# Patient Record
Sex: Male | Born: 1965 | Race: White | Hispanic: No | Marital: Married | Smoking: Current every day smoker
Health system: Southern US, Community
[De-identification: ages and names within clinical notes are randomized; demographics above are authoritative.]

## PROBLEM LIST (undated history)

## (undated) DIAGNOSIS — K859 Acute pancreatitis without necrosis or infection, unspecified: Secondary | ICD-10-CM

## (undated) DIAGNOSIS — Z720 Tobacco use: Secondary | ICD-10-CM

## (undated) DIAGNOSIS — I10 Essential (primary) hypertension: Secondary | ICD-10-CM

## (undated) DIAGNOSIS — F1020 Alcohol dependence, uncomplicated: Secondary | ICD-10-CM

## (undated) DIAGNOSIS — M109 Gout, unspecified: Secondary | ICD-10-CM

---

## 2020-10-21 ENCOUNTER — Encounter (HOSPITAL_COMMUNITY): Payer: Self-pay | Admitting: Pulmonary Disease

## 2020-10-21 ENCOUNTER — Inpatient Hospital Stay (HOSPITAL_COMMUNITY): Payer: BC Managed Care – PPO | Admitting: Anesthesiology

## 2020-10-21 ENCOUNTER — Other Ambulatory Visit: Payer: Self-pay

## 2020-10-21 ENCOUNTER — Inpatient Hospital Stay (HOSPITAL_COMMUNITY)
Admission: EM | Admit: 2020-10-21 | Discharge: 2020-12-14 | DRG: 853 | Disposition: E | Payer: BC Managed Care – PPO | Attending: Pulmonary Disease | Admitting: Pulmonary Disease

## 2020-10-21 ENCOUNTER — Emergency Department (HOSPITAL_COMMUNITY): Payer: BC Managed Care – PPO

## 2020-10-21 ENCOUNTER — Encounter (HOSPITAL_COMMUNITY): Admission: EM | Disposition: E | Payer: Self-pay | Source: Home / Self Care | Attending: Family Medicine

## 2020-10-21 DIAGNOSIS — Z452 Encounter for adjustment and management of vascular access device: Secondary | ICD-10-CM

## 2020-10-21 DIAGNOSIS — D62 Acute posthemorrhagic anemia: Secondary | ICD-10-CM | POA: Diagnosis present

## 2020-10-21 DIAGNOSIS — Z515 Encounter for palliative care: Secondary | ICD-10-CM | POA: Diagnosis not present

## 2020-10-21 DIAGNOSIS — D689 Coagulation defect, unspecified: Secondary | ICD-10-CM | POA: Diagnosis present

## 2020-10-21 DIAGNOSIS — Z4659 Encounter for fitting and adjustment of other gastrointestinal appliance and device: Secondary | ICD-10-CM

## 2020-10-21 DIAGNOSIS — I469 Cardiac arrest, cause unspecified: Secondary | ICD-10-CM | POA: Diagnosis not present

## 2020-10-21 DIAGNOSIS — S0091XA Abrasion of unspecified part of head, initial encounter: Secondary | ICD-10-CM | POA: Diagnosis present

## 2020-10-21 DIAGNOSIS — D696 Thrombocytopenia, unspecified: Secondary | ICD-10-CM | POA: Diagnosis not present

## 2020-10-21 DIAGNOSIS — K7011 Alcoholic hepatitis with ascites: Secondary | ICD-10-CM | POA: Diagnosis present

## 2020-10-21 DIAGNOSIS — J690 Pneumonitis due to inhalation of food and vomit: Secondary | ICD-10-CM

## 2020-10-21 DIAGNOSIS — F17210 Nicotine dependence, cigarettes, uncomplicated: Secondary | ICD-10-CM | POA: Diagnosis present

## 2020-10-21 DIAGNOSIS — A4181 Sepsis due to Enterococcus: Secondary | ICD-10-CM | POA: Diagnosis present

## 2020-10-21 DIAGNOSIS — F1020 Alcohol dependence, uncomplicated: Secondary | ICD-10-CM | POA: Diagnosis present

## 2020-10-21 DIAGNOSIS — E43 Unspecified severe protein-calorie malnutrition: Secondary | ICD-10-CM | POA: Diagnosis present

## 2020-10-21 DIAGNOSIS — E871 Hypo-osmolality and hyponatremia: Secondary | ICD-10-CM | POA: Diagnosis not present

## 2020-10-21 DIAGNOSIS — K750 Abscess of liver: Secondary | ICD-10-CM | POA: Diagnosis not present

## 2020-10-21 DIAGNOSIS — K632 Fistula of intestine: Secondary | ICD-10-CM | POA: Diagnosis present

## 2020-10-21 DIAGNOSIS — R140 Abdominal distension (gaseous): Secondary | ICD-10-CM

## 2020-10-21 DIAGNOSIS — J9601 Acute respiratory failure with hypoxia: Secondary | ICD-10-CM | POA: Diagnosis not present

## 2020-10-21 DIAGNOSIS — G929 Unspecified toxic encephalopathy: Secondary | ICD-10-CM | POA: Diagnosis not present

## 2020-10-21 DIAGNOSIS — Y848 Other medical procedures as the cause of abnormal reaction of the patient, or of later complication, without mention of misadventure at the time of the procedure: Secondary | ICD-10-CM | POA: Diagnosis not present

## 2020-10-21 DIAGNOSIS — R198 Other specified symptoms and signs involving the digestive system and abdomen: Secondary | ICD-10-CM

## 2020-10-21 DIAGNOSIS — E8809 Other disorders of plasma-protein metabolism, not elsewhere classified: Secondary | ICD-10-CM | POA: Diagnosis not present

## 2020-10-21 DIAGNOSIS — J9600 Acute respiratory failure, unspecified whether with hypoxia or hypercapnia: Secondary | ICD-10-CM

## 2020-10-21 DIAGNOSIS — R188 Other ascites: Secondary | ICD-10-CM | POA: Diagnosis not present

## 2020-10-21 DIAGNOSIS — R1310 Dysphagia, unspecified: Secondary | ICD-10-CM | POA: Diagnosis not present

## 2020-10-21 DIAGNOSIS — K7031 Alcoholic cirrhosis of liver with ascites: Secondary | ICD-10-CM | POA: Diagnosis present

## 2020-10-21 DIAGNOSIS — R0689 Other abnormalities of breathing: Secondary | ICD-10-CM

## 2020-10-21 DIAGNOSIS — R0902 Hypoxemia: Secondary | ICD-10-CM

## 2020-10-21 DIAGNOSIS — R0602 Shortness of breath: Secondary | ICD-10-CM

## 2020-10-21 DIAGNOSIS — G928 Other toxic encephalopathy: Secondary | ICD-10-CM | POA: Diagnosis present

## 2020-10-21 DIAGNOSIS — Z9911 Dependence on respirator [ventilator] status: Secondary | ICD-10-CM

## 2020-10-21 DIAGNOSIS — K631 Perforation of intestine (nontraumatic): Secondary | ICD-10-CM | POA: Diagnosis not present

## 2020-10-21 DIAGNOSIS — N170 Acute kidney failure with tubular necrosis: Secondary | ICD-10-CM | POA: Diagnosis not present

## 2020-10-21 DIAGNOSIS — K651 Peritoneal abscess: Secondary | ICD-10-CM | POA: Diagnosis not present

## 2020-10-21 DIAGNOSIS — Z800 Family history of malignant neoplasm of digestive organs: Secondary | ICD-10-CM

## 2020-10-21 DIAGNOSIS — R6520 Severe sepsis without septic shock: Secondary | ICD-10-CM

## 2020-10-21 DIAGNOSIS — I5021 Acute systolic (congestive) heart failure: Secondary | ICD-10-CM | POA: Diagnosis not present

## 2020-10-21 DIAGNOSIS — Z789 Other specified health status: Secondary | ICD-10-CM | POA: Diagnosis not present

## 2020-10-21 DIAGNOSIS — J95821 Acute postprocedural respiratory failure: Secondary | ICD-10-CM | POA: Diagnosis not present

## 2020-10-21 DIAGNOSIS — R4182 Altered mental status, unspecified: Secondary | ICD-10-CM | POA: Diagnosis not present

## 2020-10-21 DIAGNOSIS — R6521 Severe sepsis with septic shock: Secondary | ICD-10-CM | POA: Diagnosis present

## 2020-10-21 DIAGNOSIS — E8770 Fluid overload, unspecified: Secondary | ICD-10-CM | POA: Diagnosis not present

## 2020-10-21 DIAGNOSIS — A419 Sepsis, unspecified organism: Secondary | ICD-10-CM

## 2020-10-21 DIAGNOSIS — L89156 Pressure-induced deep tissue damage of sacral region: Secondary | ICD-10-CM | POA: Diagnosis present

## 2020-10-21 DIAGNOSIS — D539 Nutritional anemia, unspecified: Secondary | ICD-10-CM | POA: Diagnosis present

## 2020-10-21 DIAGNOSIS — G9341 Metabolic encephalopathy: Secondary | ICD-10-CM | POA: Diagnosis not present

## 2020-10-21 DIAGNOSIS — Z79899 Other long term (current) drug therapy: Secondary | ICD-10-CM

## 2020-10-21 DIAGNOSIS — R0603 Acute respiratory distress: Secondary | ICD-10-CM

## 2020-10-21 DIAGNOSIS — T380X5A Adverse effect of glucocorticoids and synthetic analogues, initial encounter: Secondary | ICD-10-CM | POA: Diagnosis not present

## 2020-10-21 DIAGNOSIS — E669 Obesity, unspecified: Secondary | ICD-10-CM | POA: Diagnosis present

## 2020-10-21 DIAGNOSIS — K8592 Acute pancreatitis with infected necrosis, unspecified: Secondary | ICD-10-CM | POA: Diagnosis present

## 2020-10-21 DIAGNOSIS — J158 Pneumonia due to other specified bacteria: Secondary | ICD-10-CM | POA: Diagnosis not present

## 2020-10-21 DIAGNOSIS — K8591 Acute pancreatitis with uninfected necrosis, unspecified: Secondary | ICD-10-CM

## 2020-10-21 DIAGNOSIS — G8929 Other chronic pain: Secondary | ICD-10-CM | POA: Diagnosis present

## 2020-10-21 DIAGNOSIS — Z7189 Other specified counseling: Secondary | ICD-10-CM | POA: Diagnosis not present

## 2020-10-21 DIAGNOSIS — E1165 Type 2 diabetes mellitus with hyperglycemia: Secondary | ICD-10-CM | POA: Diagnosis not present

## 2020-10-21 DIAGNOSIS — Z781 Physical restraint status: Secondary | ICD-10-CM

## 2020-10-21 DIAGNOSIS — K7682 Hepatic encephalopathy: Secondary | ICD-10-CM | POA: Diagnosis not present

## 2020-10-21 DIAGNOSIS — L0291 Cutaneous abscess, unspecified: Secondary | ICD-10-CM | POA: Diagnosis not present

## 2020-10-21 DIAGNOSIS — J95851 Ventilator associated pneumonia: Secondary | ICD-10-CM | POA: Diagnosis not present

## 2020-10-21 DIAGNOSIS — I10 Essential (primary) hypertension: Secondary | ICD-10-CM | POA: Diagnosis present

## 2020-10-21 DIAGNOSIS — Z20822 Contact with and (suspected) exposure to covid-19: Secondary | ICD-10-CM | POA: Diagnosis present

## 2020-10-21 DIAGNOSIS — J9811 Atelectasis: Secondary | ICD-10-CM | POA: Diagnosis not present

## 2020-10-21 DIAGNOSIS — Z7951 Long term (current) use of inhaled steroids: Secondary | ICD-10-CM

## 2020-10-21 DIAGNOSIS — E876 Hypokalemia: Secondary | ICD-10-CM | POA: Diagnosis not present

## 2020-10-21 DIAGNOSIS — L8990 Pressure ulcer of unspecified site, unspecified stage: Secondary | ICD-10-CM | POA: Diagnosis not present

## 2020-10-21 DIAGNOSIS — K567 Ileus, unspecified: Secondary | ICD-10-CM

## 2020-10-21 DIAGNOSIS — G9340 Encephalopathy, unspecified: Secondary | ICD-10-CM | POA: Diagnosis not present

## 2020-10-21 DIAGNOSIS — J189 Pneumonia, unspecified organism: Secondary | ICD-10-CM

## 2020-10-21 DIAGNOSIS — J9690 Respiratory failure, unspecified, unspecified whether with hypoxia or hypercapnia: Secondary | ICD-10-CM

## 2020-10-21 DIAGNOSIS — R410 Disorientation, unspecified: Secondary | ICD-10-CM | POA: Diagnosis not present

## 2020-10-21 DIAGNOSIS — F419 Anxiety disorder, unspecified: Secondary | ICD-10-CM | POA: Diagnosis present

## 2020-10-21 DIAGNOSIS — M109 Gout, unspecified: Secondary | ICD-10-CM | POA: Diagnosis present

## 2020-10-21 DIAGNOSIS — Z66 Do not resuscitate: Secondary | ICD-10-CM | POA: Diagnosis not present

## 2020-10-21 DIAGNOSIS — R609 Edema, unspecified: Secondary | ICD-10-CM | POA: Diagnosis not present

## 2020-10-21 DIAGNOSIS — Z9889 Other specified postprocedural states: Secondary | ICD-10-CM | POA: Diagnosis not present

## 2020-10-21 HISTORY — PX: LAPAROSCOPY: SHX197

## 2020-10-21 HISTORY — PX: LAPAROTOMY: SHX154

## 2020-10-21 HISTORY — DX: Tobacco use: Z72.0

## 2020-10-21 HISTORY — DX: Acute pancreatitis without necrosis or infection, unspecified: K85.90

## 2020-10-21 HISTORY — DX: Essential (primary) hypertension: I10

## 2020-10-21 HISTORY — DX: Gout, unspecified: M10.9

## 2020-10-21 HISTORY — DX: Alcohol dependence, uncomplicated: F10.20

## 2020-10-21 LAB — URINALYSIS, ROUTINE W REFLEX MICROSCOPIC
Glucose, UA: NEGATIVE mg/dL
Ketones, ur: 15 mg/dL — AB
Leukocytes,Ua: NEGATIVE
Nitrite: NEGATIVE
Protein, ur: NEGATIVE mg/dL
Specific Gravity, Urine: 1.015 (ref 1.005–1.030)
pH: 6 (ref 5.0–8.0)

## 2020-10-21 LAB — PROTIME-INR
INR: 2.1 — ABNORMAL HIGH (ref 0.8–1.2)
Prothrombin Time: 23.9 seconds — ABNORMAL HIGH (ref 11.4–15.2)

## 2020-10-21 LAB — LIPASE, BLOOD: Lipase: 25 U/L (ref 11–51)

## 2020-10-21 LAB — CBC
HCT: 34.1 % — ABNORMAL LOW (ref 39.0–52.0)
Hemoglobin: 11.4 g/dL — ABNORMAL LOW (ref 13.0–17.0)
MCH: 36.1 pg — ABNORMAL HIGH (ref 26.0–34.0)
MCHC: 33.4 g/dL (ref 30.0–36.0)
MCV: 107.9 fL — ABNORMAL HIGH (ref 80.0–100.0)
Platelets: 322 10*3/uL (ref 150–400)
RBC: 3.16 MIL/uL — ABNORMAL LOW (ref 4.22–5.81)
RDW: 14.6 % (ref 11.5–15.5)
WBC: 29.9 10*3/uL — ABNORMAL HIGH (ref 4.0–10.5)
nRBC: 0.3 % — ABNORMAL HIGH (ref 0.0–0.2)

## 2020-10-21 LAB — I-STAT ARTERIAL BLOOD GAS, ED
Acid-Base Excess: 5 mmol/L — ABNORMAL HIGH (ref 0.0–2.0)
Bicarbonate: 28.7 mmol/L — ABNORMAL HIGH (ref 20.0–28.0)
Calcium, Ion: 1.06 mmol/L — ABNORMAL LOW (ref 1.15–1.40)
HCT: 32 % — ABNORMAL LOW (ref 39.0–52.0)
Hemoglobin: 10.9 g/dL — ABNORMAL LOW (ref 13.0–17.0)
O2 Saturation: 94 %
Patient temperature: 98.6
Potassium: 4.3 mmol/L (ref 3.5–5.1)
Sodium: 124 mmol/L — ABNORMAL LOW (ref 135–145)
TCO2: 30 mmol/L (ref 22–32)
pCO2 arterial: 36.1 mmHg (ref 32.0–48.0)
pH, Arterial: 7.509 — ABNORMAL HIGH (ref 7.350–7.450)
pO2, Arterial: 64 mmHg — ABNORMAL LOW (ref 83.0–108.0)

## 2020-10-21 LAB — CK: Total CK: 27 U/L — ABNORMAL LOW (ref 49–397)

## 2020-10-21 LAB — COMPREHENSIVE METABOLIC PANEL
ALT: 53 U/L — ABNORMAL HIGH (ref 0–44)
AST: 76 U/L — ABNORMAL HIGH (ref 15–41)
Albumin: 1.7 g/dL — ABNORMAL LOW (ref 3.5–5.0)
Alkaline Phosphatase: 117 U/L (ref 38–126)
Anion gap: 14 (ref 5–15)
BUN: 26 mg/dL — ABNORMAL HIGH (ref 6–20)
CO2: 26 mmol/L (ref 22–32)
Calcium: 8.1 mg/dL — ABNORMAL LOW (ref 8.9–10.3)
Chloride: 84 mmol/L — ABNORMAL LOW (ref 98–111)
Creatinine, Ser: 1.19 mg/dL (ref 0.61–1.24)
GFR, Estimated: >60 mL/min (ref >60)
Glucose, Bld: 199 mg/dL — ABNORMAL HIGH (ref 70–99)
Potassium: 4.7 mmol/L (ref 3.5–5.1)
Sodium: 124 mmol/L — ABNORMAL LOW (ref 135–145)
Total Bilirubin: 1.4 mg/dL — ABNORMAL HIGH (ref 0.3–1.2)
Total Protein: 6.1 g/dL — ABNORMAL LOW (ref 6.5–8.1)

## 2020-10-21 LAB — RESP PANEL BY RT-PCR (FLU A&B, COVID) ARPGX2
Influenza A by PCR: NEGATIVE
Influenza B by PCR: NEGATIVE
SARS Coronavirus 2 by RT PCR: NEGATIVE

## 2020-10-21 LAB — PHOSPHORUS: Phosphorus: 4.6 mg/dL (ref 2.5–4.6)

## 2020-10-21 LAB — HEMOGLOBIN A1C
Hgb A1c MFr Bld: 6.8 % — ABNORMAL HIGH (ref 4.8–5.6)
Mean Plasma Glucose: 148.46 mg/dL

## 2020-10-21 LAB — URINALYSIS, MICROSCOPIC (REFLEX): Bacteria, UA: NONE SEEN

## 2020-10-21 LAB — MAGNESIUM: Magnesium: 2.3 mg/dL (ref 1.7–2.4)

## 2020-10-21 LAB — LACTIC ACID, PLASMA: Lactic Acid, Venous: 2 mmol/L (ref 0.5–1.9)

## 2020-10-21 LAB — ETHANOL: Alcohol, Ethyl (B): <10 mg/dL (ref <10)

## 2020-10-21 LAB — AMMONIA: Ammonia: 37 umol/L — ABNORMAL HIGH (ref 9–35)

## 2020-10-21 LAB — PROCALCITONIN: Procalcitonin: 16.09 ng/mL

## 2020-10-21 LAB — LACTATE DEHYDROGENASE: LDH: 323 U/L — ABNORMAL HIGH (ref 98–192)

## 2020-10-21 LAB — BRAIN NATRIURETIC PEPTIDE: B Natriuretic Peptide: 139.2 pg/mL — ABNORMAL HIGH (ref 0.0–100.0)

## 2020-10-21 LAB — SODIUM: Sodium: 124 mmol/L — ABNORMAL LOW (ref 135–145)

## 2020-10-21 LAB — CBG MONITORING, ED: Glucose-Capillary: 206 mg/dL — ABNORMAL HIGH (ref 70–99)

## 2020-10-21 LAB — OSMOLALITY: Osmolality: 280 mOsm/kg (ref 275–295)

## 2020-10-21 SURGERY — LAPAROSCOPY, DIAGNOSTIC
Anesthesia: General | Site: Abdomen

## 2020-10-21 MED ORDER — FLUTICASONE-UMECLIDIN-VILANT 100-62.5-25 MCG/INH IN AEPB: 1.0000 | INHALATION_SPRAY | Freq: Every day | RESPIRATORY_TRACT | Status: DC

## 2020-10-21 MED ORDER — ALBUMIN HUMAN 25 % IV SOLN
25.0000 g | Freq: Once | INTRAVENOUS | Status: DC
  Filled 2020-10-21: qty 100

## 2020-10-21 MED ORDER — ALBUMIN HUMAN 25 % IV SOLN
25.0000 g | Freq: Four times a day (QID) | INTRAVENOUS | Status: AC
  Administered 2020-10-22 (×2): 25 g via INTRAVENOUS
  Filled 2020-10-21 (×3): qty 100

## 2020-10-21 MED ORDER — UMECLIDINIUM BROMIDE 62.5 MCG/INH IN AEPB
1.0000 | INHALATION_SPRAY | Freq: Every day | RESPIRATORY_TRACT | Status: DC
  Administered 2020-10-27 – 2020-11-13 (×17): 1 via RESPIRATORY_TRACT
  Filled 2020-10-21 (×4): qty 7

## 2020-10-21 MED ORDER — DOCUSATE SODIUM 100 MG PO CAPS: 100.0000 mg | ORAL_CAPSULE | Freq: Two times a day (BID) | ORAL | Status: DC | PRN

## 2020-10-21 MED ORDER — POLYETHYLENE GLYCOL 3350 17 G PO PACK: 17.0000 g | PACK | Freq: Every day | ORAL | Status: DC | PRN

## 2020-10-21 MED ORDER — PANTOPRAZOLE SODIUM 40 MG IV SOLR
40.0000 mg | Freq: Two times a day (BID) | INTRAVENOUS | Status: DC
  Administered 2020-10-22: 40 mg via INTRAVENOUS
  Filled 2020-10-21: qty 40

## 2020-10-21 MED ORDER — LORAZEPAM 2 MG/ML IJ SOLN
0.5000 mg | Freq: Once | INTRAMUSCULAR | Status: AC
  Administered 2020-10-21: 0.5 mg via INTRAVENOUS
  Filled 2020-10-21: qty 1

## 2020-10-21 MED ORDER — ALBUMIN HUMAN 25 % IV SOLN
50.0000 g | Freq: Once | INTRAVENOUS | Status: AC
  Administered 2020-10-22: 50 g via INTRAVENOUS
  Filled 2020-10-21: qty 200

## 2020-10-21 MED ORDER — PIPERACILLIN-TAZOBACTAM 3.375 G IVPB 30 MIN
3.3750 g | Freq: Once | INTRAVENOUS | Status: AC
  Administered 2020-10-21: 3.375 g via INTRAVENOUS
  Filled 2020-10-21: qty 50

## 2020-10-21 MED ORDER — FLUTICASONE FUROATE-VILANTEROL 100-25 MCG/INH IN AEPB
1.0000 | INHALATION_SPRAY | Freq: Every day | RESPIRATORY_TRACT | Status: DC
  Administered 2020-10-27 – 2020-11-13 (×17): 1 via RESPIRATORY_TRACT
  Filled 2020-10-21 (×3): qty 28

## 2020-10-21 MED ORDER — FLUCONAZOLE IN SODIUM CHLORIDE 400-0.9 MG/200ML-% IV SOLN
400.0000 mg | INTRAVENOUS | Status: DC
  Administered 2020-10-21 – 2020-10-23 (×3): 400 mg via INTRAVENOUS
  Filled 2020-10-21 (×3): qty 200

## 2020-10-21 MED ORDER — FOLIC ACID 5 MG/ML IJ SOLN
1.0000 mg | Freq: Every day | INTRAMUSCULAR | Status: DC
  Administered 2020-10-22 – 2020-10-23 (×2): 1 mg via INTRAVENOUS
  Filled 2020-10-21 (×2): qty 0.2

## 2020-10-21 MED ORDER — IOHEXOL 350 MG/ML SOLN
100.0000 mL | Freq: Once | INTRAVENOUS | Status: AC | PRN
  Administered 2020-10-21: 100 mL via INTRAVENOUS

## 2020-10-21 MED ORDER — PIPERACILLIN-TAZOBACTAM 3.375 G IVPB
3.3750 g | Freq: Three times a day (TID) | INTRAVENOUS | Status: DC
  Administered 2020-10-22 – 2020-10-28 (×20): 3.375 g via INTRAVENOUS
  Filled 2020-10-21 (×23): qty 50

## 2020-10-21 MED ORDER — ALBUTEROL SULFATE (2.5 MG/3ML) 0.083% IN NEBU
3.0000 mL | INHALATION_SOLUTION | Freq: Four times a day (QID) | RESPIRATORY_TRACT | Status: DC | PRN
  Administered 2020-11-05 – 2020-11-19 (×5): 3 mL via RESPIRATORY_TRACT
  Filled 2020-10-21 (×3): qty 3
  Filled 2020-10-21: qty 6
  Filled 2020-10-21: qty 3

## 2020-10-21 MED ORDER — LACTATED RINGERS IV SOLN: INTRAVENOUS | Status: DC

## 2020-10-21 MED ORDER — METRONIDAZOLE 500 MG/100ML IV SOLN
500.0000 mg | Freq: Two times a day (BID) | INTRAVENOUS | Status: DC
  Administered 2020-10-22: 500 mg via INTRAVENOUS
  Filled 2020-10-21: qty 100

## 2020-10-21 MED ORDER — VANCOMYCIN HCL 2000 MG/400ML IV SOLN
2000.0000 mg | Freq: Once | INTRAVENOUS | Status: AC
  Administered 2020-10-21: 2000 mg via INTRAVENOUS
  Filled 2020-10-21: qty 400

## 2020-10-21 MED ORDER — THIAMINE HCL 100 MG/ML IJ SOLN
100.0000 mg | Freq: Every day | INTRAMUSCULAR | Status: DC
  Administered 2020-10-22 – 2020-10-23 (×2): 100 mg via INTRAVENOUS
  Filled 2020-10-21 (×2): qty 2

## 2020-10-21 MED ORDER — HEPARIN SODIUM (PORCINE) 5000 UNIT/ML IJ SOLN
5000.0000 [IU] | Freq: Three times a day (TID) | INTRAMUSCULAR | Status: DC
  Administered 2020-10-22 – 2020-11-17 (×79): 5000 [IU] via SUBCUTANEOUS
  Filled 2020-10-21 (×78): qty 1

## 2020-10-21 MED ORDER — INSULIN ASPART 100 UNIT/ML IJ SOLN
0.0000 [IU] | INTRAMUSCULAR | Status: DC
  Administered 2020-10-22 (×2): 3 [IU] via SUBCUTANEOUS
  Administered 2020-10-22 (×2): 5 [IU] via SUBCUTANEOUS
  Administered 2020-10-22: 3 [IU] via SUBCUTANEOUS
  Administered 2020-10-23: 2 [IU] via SUBCUTANEOUS
  Administered 2020-10-23: 5 [IU] via SUBCUTANEOUS
  Administered 2020-10-23: 3 [IU] via SUBCUTANEOUS

## 2020-10-21 MED ORDER — VANCOMYCIN HCL 1500 MG/300ML IV SOLN: 1500.0000 mg | INTRAVENOUS | Status: DC

## 2020-10-21 MED ORDER — FENTANYL CITRATE PF 50 MCG/ML IJ SOSY
25.0000 ug | PREFILLED_SYRINGE | Freq: Once | INTRAMUSCULAR | Status: AC
  Administered 2020-10-21: 25 ug via INTRAVENOUS
  Filled 2020-10-21: qty 1

## 2020-10-21 MED ORDER — BUPIVACAINE HCL (PF) 0.25 % IJ SOLN
INTRAMUSCULAR | Status: AC
  Filled 2020-10-21: qty 30

## 2020-10-21 SURGICAL SUPPLY — 48 items
BLADE CLIPPER SURG (BLADE) ×3 IMPLANT
BLADE SURG 10 STRL SS (BLADE) ×3 IMPLANT
BNDG GAUZE ELAST 4 BULKY (GAUZE/BANDAGES/DRESSINGS) ×3 IMPLANT
CHLORAPREP W/TINT 26 (MISCELLANEOUS) ×3 IMPLANT
COVER SURGICAL LIGHT HANDLE (MISCELLANEOUS) ×3 IMPLANT
DRAIN CHANNEL 28F RND 3/8 FF (WOUND CARE) ×3 IMPLANT
DRAIN CHANNEL 32F RND 10.7 FF (WOUND CARE) ×3 IMPLANT
DRAPE WARM FLUID 44X44 (DRAPES) ×3 IMPLANT
ELECT CAUTERY BLADE 6.4 (BLADE) ×3 IMPLANT
ELECT REM PT RETURN 9FT ADLT (ELECTROSURGICAL) ×3
ELECTRODE REM PT RTRN 9FT ADLT (ELECTROSURGICAL) ×2 IMPLANT
EVACUATOR SILICONE 100CC (DRAIN) ×6 IMPLANT
GAUZE SPONGE 4X4 12PLY STRL (GAUZE/BANDAGES/DRESSINGS) ×3 IMPLANT
GLOVE SURG POLYISO LF SZ7 (GLOVE) ×3 IMPLANT
GLOVE SURG UNDER POLY LF SZ7 (GLOVE) ×3 IMPLANT
GOWN STRL REUS W/ TWL LRG LVL3 (GOWN DISPOSABLE) ×4 IMPLANT
GOWN STRL REUS W/TWL LRG LVL3 (GOWN DISPOSABLE) ×2
HANDLE SUCTION POOLE (INSTRUMENTS) ×2 IMPLANT
KIT BASIN OR (CUSTOM PROCEDURE TRAY) ×3 IMPLANT
KIT TURNOVER KIT B (KITS) ×3 IMPLANT
NS IRRIG 1000ML POUR BTL (IV SOLUTION) ×3 IMPLANT
PAD ABD 8X10 STRL (GAUZE/BANDAGES/DRESSINGS) ×3 IMPLANT
PAD ARMBOARD 7.5X6 YLW CONV (MISCELLANEOUS) ×6 IMPLANT
PENCIL SMOKE EVACUATOR (MISCELLANEOUS) ×3 IMPLANT
SEALER TISSUE X1 CVD JAW (INSTRUMENTS) ×3 IMPLANT
SET IRRIG TUBING LAPAROSCOPIC (IRRIGATION / IRRIGATOR) ×3 IMPLANT
SET TUBE SMOKE EVAC HIGH FLOW (TUBING) ×3 IMPLANT
SLEEVE ENDOPATH XCEL 5M (ENDOMECHANICALS) ×3 IMPLANT
SPONGE T-LAP 18X18 ~~LOC~~+RFID (SPONGE) ×3 IMPLANT
STAPLER VISISTAT 35W (STAPLE) ×3 IMPLANT
SUCTION POOLE HANDLE (INSTRUMENTS) ×3
SUT ETHILON 2 0 FS 18 (SUTURE) ×6 IMPLANT
SUT PDS AB 0 CT 36 (SUTURE) ×6 IMPLANT
SUT PDS AB 1 TP1 96 (SUTURE) IMPLANT
SUT SILK 2 0 (SUTURE) ×1
SUT SILK 2 0 SH CR/8 (SUTURE) ×3 IMPLANT
SUT SILK 2-0 18XBRD TIE 12 (SUTURE) ×2 IMPLANT
SUT SILK 3 0 (SUTURE) ×1
SUT SILK 3 0 SH CR/8 (SUTURE) ×3 IMPLANT
SUT SILK 3-0 18XBRD TIE 12 (SUTURE) ×2 IMPLANT
TAPE CLOTH SURG 6X10 WHT LF (GAUZE/BANDAGES/DRESSINGS) ×3 IMPLANT
TOWEL GREEN STERILE (TOWEL DISPOSABLE) ×3 IMPLANT
TRAY FOLEY MTR SLVR 16FR STAT (SET/KITS/TRAYS/PACK) ×3 IMPLANT
TRAY LAPAROSCOPIC MC (CUSTOM PROCEDURE TRAY) ×3 IMPLANT
TROCAR XCEL NON-BLD 11X100MML (ENDOMECHANICALS) ×3 IMPLANT
TROCAR XCEL NON-BLD 5MMX100MML (ENDOMECHANICALS) ×3 IMPLANT
WARMER LAPAROSCOPE (MISCELLANEOUS) ×3 IMPLANT
YANKAUER SUCT BULB TIP NO VENT (SUCTIONS) ×3 IMPLANT

## 2020-10-21 NOTE — Progress Notes (Signed)
Pharmacy Antibiotic Note  Paul Chambers is a 55 y.o. male admitted on 10/27/2020 with  intra-abdominal infection .  Pharmacy has been consulted for zosyn and vancomycin dosing.  Patient presenting with 1 week of abdominal pain. Patient recently seen at [facility] and diagnosed with AKI and pancreatitis.  SCr 1.19; WBC 29.9  Plan: Metronidazole per MD - Recommend stopping if zosyn continued post-op Fluconazole per MD Vancomycin 2000 mg once followed by 1500 mg q24h (eAUC 439 using wt of 110 kg and height of 68 inches) Zosyn 3.375g IV q8h (4 hour infusion). Monitor renal function F/u cultures     Temp (24hrs), Avg:99.4 F (37.4 C), Min:99.4 F (37.4 C), Max:99.4 F (37.4 C)  Recent Labs  Lab 10/15/2020 1506  WBC 29.9*  CREATININE 1.19    CrCl cannot be calculated (Unknown ideal weight.).    No Known Allergies  Antimicrobials this admission: zosyn 9/8 >>  vancomycin 9/8 >>  Metronidazole 9/8 >>> Fluconazole 9/8 >>  Microbiology results: Pending  Thank you for allowing pharmacy to be a part of this patient's care.  Cathie Hoops 10/16/2020 10:15 PM

## 2020-10-21 NOTE — Consult Note (Signed)
Reason for Consult:sepsis Referring Provider: Gerhard Munchobert Chambers  Treasa SchoolShawn Chambers is an 55 y.o. male.  HPI: (obtained from family) 55 yo male with 1 week of abdominal pain. [facility] week ago and diagnosed with AKI and pancreatitis, treated for 23 h and discharged home. He has continued to have abdominal pain. Pain is constant. It does not radiate. It is throughout his abdomen. It is worse with food. It is worse with movement. Over the last day he has had increased confusion and pain and brought to the ED.   He received broad spectrum antibiotics and pain control.  He has a drinking history but was never told he had a liver problem. He has been sober for 1 month  No past medical history on file.  No family history on file.  Social History:  has no history on file for tobacco use, alcohol use, and drug use.  Allergies: No Known Allergies  Medications: I have reviewed the patient's current medications.  Results for orders placed or performed during the hospital encounter of 11/02/2020 (from the past 48 hour(s))  CBG monitoring, ED     Status: Abnormal   Collection Time: 10/19/2020  2:56 PM  Result Value Ref Range   Glucose-Capillary 206 (H) 70 - 99 mg/dL    Comment: Glucose reference range applies only to samples taken after fasting for at least 8 hours.  Comprehensive metabolic panel     Status: Abnormal   Collection Time: 10/22/2020  3:06 PM  Result Value Ref Range   Sodium 124 (L) 135 - 145 mmol/L   Potassium 4.7 3.5 - 5.1 mmol/L   Chloride 84 (L) 98 - 111 mmol/L   CO2 26 22 - 32 mmol/L   Glucose, Bld 199 (H) 70 - 99 mg/dL    Comment: Glucose reference range applies only to samples taken after fasting for at least 8 hours.   BUN 26 (H) 6 - 20 mg/dL   Creatinine, Ser 4.091.19 0.61 - 1.24 mg/dL   Calcium 8.1 (L) 8.9 - 10.3 mg/dL   Total Protein 6.1 (L) 6.5 - 8.1 g/dL   Albumin 1.7 (L) 3.5 - 5.0 g/dL   AST 76 (H) 15 - 41 U/L   ALT 53 (H) 0 - 44 U/L   Alkaline  Phosphatase 117 38 - 126 U/L   Total Bilirubin 1.4 (H) 0.3 - 1.2 mg/dL   GFR, Estimated >81>60 >19>60 mL/min    Comment: (NOTE) Calculated using the CKD-EPI Creatinine Equation (2021)    Anion gap 14 5 - 15    Comment: Performed at [facility], [address] [place], [place] [address]  CBC     Status: Abnormal   Collection Time: 10/18/2020  3:06 PM  Result Value Ref Range   WBC 29.9 (H) 4.0 - 10.5 K/uL   RBC 3.16 (L) 4.22 - 5.81 MIL/uL   Hemoglobin 11.4 (L) 13.0 - 17.0 g/dL   HCT 95.634.1 (L) 21.339.0 - 08.652.0 %   MCV 107.9 (H) 80.0 - 100.0 fL   MCH 36.1 (H) 26.0 - 34.0 pg   MCHC 33.4 30.0 - 36.0 g/dL   RDW 57.814.6 46.911.5 - 62.915.5 %   Platelets 322 150 - 400 K/uL   nRBC 0.3 (H) 0.0 - 0.2 %    Comment: Performed at [facility], [address] [place], [place] [address]  Lipase, blood     Status: None   Collection Time: 11/07/2020  3:06 PM  Result Value Ref Range   Lipase 25 11 - 51 U/L    Comment: Performed at [facility], [address] [place], [place] [address]  Brain natriuretic peptide     Status: Abnormal   Collection Time: 10/19/2020  3:06 PM  Result Value Ref Range   B Natriuretic Peptide 139.2 (H) 0.0 - 100.0 pg/mL    Comment: Performed at [facility], [address], [place], [place] [address]  Urinalysis, Routine w reflex microscopic     Status: Abnormal   Collection Time: 10/31/2020  3:49 PM  Result Value Ref Range   Color, Urine AMBER (A) YELLOW    Comment: BIOCHEMICALS MAY BE AFFECTED BY COLOR   APPearance CLEAR CLEAR   Specific Gravity, Urine 1.015 1.005 - 1.030   pH 6.0 5.0 - 8.0   Glucose, UA NEGATIVE NEGATIVE mg/dL   Hgb urine dipstick TRACE (A) NEGATIVE   Bilirubin Urine SMALL (A) NEGATIVE   Ketones, ur 15 (A) NEGATIVE mg/dL   Protein, ur NEGATIVE NEGATIVE mg/dL   Nitrite NEGATIVE NEGATIVE   Leukocytes,Ua NEGATIVE NEGATIVE    Comment: Performed at [facility], [address], [place], [place] [address]  Urinalysis, Microscopic (reflex)      Status: None   Collection Time: 10/19/2020  3:49 PM  Result Value Ref Range   RBC / HPF 0-5 0 - 5 RBC/hpf   WBC, UA 0-5 0 - 5 WBC/hpf   Bacteria, UA NONE SEEN NONE SEEN   Squamous Epithelial / LPF 0-5 0 - 5   Mucus PRESENT    Hyaline Casts, UA PRESENT     Comment: Performed at [facility], [address] [place], [place] [address]  Lactate dehydrogenase     Status: Abnormal   Collection Time: 11/10/2020  3:50 PM  Result Value Ref Range   LDH 323 (H) 98 - 192 U/L    Comment: Performed at [facility], [address], [place], [place] [address]  Ethanol     Status: None   Collection Time: 10/29/2020  3:50 PM  Result Value Ref Range   Alcohol, Ethyl (B) <10 <10 mg/dL    Comment: (NOTE) Lowest detectable limit for serum alcohol is 10 mg/dL.  For medical purposes only. Performed at [facility], [address], [place], [place] [address]   CK     Status: Abnormal   Collection Time: 10/15/2020  3:50 PM  Result Value Ref Range   Total CK 27 (L) 49 - 397 U/L    Comment: Performed at [facility], [address], [place], [place] [address]    CT Head Wo Contrast  Result Date: 11/11/2020 CLINICAL DATA:  Altered mental status. EXAM: CT HEAD WITHOUT CONTRAST TECHNIQUE: Contiguous axial images were obtained from the base of the skull through the vertex without intravenous contrast. COMPARISON:  None. FINDINGS: Brain: No evidence of acute infarction, hemorrhage, hydrocephalus, extra-axial collection or mass lesion/mass effect. Vascular: No hyperdense vessel or unexpected calcification. Skull: Normal. Negative for fracture or focal lesion. Sinuses/Orbits: No acute finding. Other: None. IMPRESSION: No acute intracranial abnormality seen. Electronically Signed   By: Lupita Raider M.D.   On: 10/27/2020 18:56   CT ABDOMEN PELVIS W CONTRAST  Result Date: 10/27/2020 CLINICAL DATA:  Abdominal abscess/infection suspected EXAM: CT ABDOMEN AND PELVIS WITH CONTRAST TECHNIQUE:  Multidetector CT imaging of the abdomen and pelvis was performed using the standard protocol following bolus administration of intravenous  contrast. CONTRAST:  OMNIPAQUE IOHEXOL 350 MG/ML SOLN COMPARISON:  None. FINDINGS: Lower chest: Small left pleural effusion. Bilateral lower lobe airspace opacities, left greater than right. Hepatobiliary: No focal hepatic abnormality. Gallbladder unremarkable. Pancreas: Pancreas is poorly visualized due to extensive fluid and gas dissecting throughout the retroperitoneum. Fluid in the region of the pancreatic body and tail. Spleen: No focal abnormality.  Normal size. Adrenals/Urinary Tract: No adrenal abnormality. No focal renal abnormality. No stones or hydronephrosis. Urinary bladder is unremarkable. Stomach/Bowel: There is extensive gas and fluid dissecting throughout the retroperitoneum. This continues into the peritoneum with pneumoperitoneum and ascites. This most likely reflects perforated hollow viscus. Given the degree of involvement of the retroperitoneum, I would suspect a retroperitoneal perforation, possibly perforated duodenal ulcer. Other possible source would be the descending duodenum although no real concerning appearance of the descending duodenum. Less likely but possible would be perforated peritoneal bowel. Vascular/Lymphatic: No evidence of aneurysm or adenopathy. Reproductive: No visible focal abnormality. Other: Extensive retroperitoneal fluid and gas as well as pneumoperitoneum and ascites as described above. Musculoskeletal: No acute bony abnormality. IMPRESSION: Extensive gas and fluid dissecting throughout the retroperitoneum and likely extending into the peritoneum where there is pneumoperitoneum and ascites. Appearance is most compatible with perforated bowel, likely in the retroperitoneum with duodenal the most likely source although exact source is not readily apparent. Pancreas poorly visualized with fluid and gas in the region of the body  and tail. While necrotizing pancreatitis could have this appearance, this is felt less likely given the extent of retroperitoneal gas and fluid and a normal lipase. Critical Value/emergent results were called by telephone at the time of interpretation on 17-Nov-2020 at 8:42 pm to provider Paul Munch , who verbally acknowledged these results. Electronically Signed   By: Charlett Nose M.D.   On: 11/17/2020 20:49   DG Chest Port 1 View  Result Date: 2020-11-17 CLINICAL DATA:  sob EXAM: PORTABLE CHEST 1 VIEW COMPARISON:  None. FINDINGS: The cardiomediastinal silhouette is within normal limits. No pleural effusion. No pneumothorax. Left lung base consolidation. No acute osseous abnormality. IMPRESSION: Left lung base consolidation suggestive of pneumonia in the appropriate clinical context. Electronically Signed   By: Olive Bass M.D.   On: 17-Nov-2020 16:33    Review of Systems  Unable to perform ROS: Severity of pain   PE Blood pressure 120/74, pulse 99, temperature 99.4 F (37.4 C), temperature source Oral, resp. rate (!) 21, SpO2 96 %. Constitutional: uncomfortable, able to say a few words at a time Eyes: Moist conjunctiva; no lid lag; anicteric; PERRL Neck: Trachea midline; no thyromegaly Lungs: Normal respiratory effort; no tactile fremitus CV: RRR; no palpable thrills; no pitting edema GI: Abd distended, tender throughout, not rigid; no palpable hepatosplenomegaly MSK: unable to assess gait; no clubbing/cyanosis Psychiatric: Appropriate affect; alert and oriented to self Lymphatic: No palpable cervical or axillary lymphadenopathy Skin: No major subcutaneous nodules. Warm and dry   Assessment/Plan: 55 yo male with alcohol history, recently admitted for pancreatitis and kidney injury. He has had persistent pain for 7 days and presents with worsening encephalopathy. Severe protein calorie malnutrition. Hyponatremia. CT scan showing free air and large amount of air tracking into the  retroperitoneum. -Initially I thought this was most likely pancreatic, but on further review and discussion with a surgical partner, due to the amount of intraperitoneal air, I do not think this should be watched and we will proceed with surgery. Plan will be diagnostic laparoscopy for localization and likely Graham's Patch or  exploratory laparotomy with bowel resection, or pancreatic drainage. We discussed the high risks of this condition and the patient's overall poor health from pneumonia, severe protein calorie malnutrition, hyponatremia, recent pancreatitis, and encephalopathy. Risks include, chronic drainage, need for repeat surgery, respiratory failure, infection, abscess, intestine injury, and death. -recommend ICU admission -IV abx  De Blanch Hanako Tipping November 14, 2020, 9:56 PM

## 2020-10-21 NOTE — Anesthesia Preprocedure Evaluation (Signed)
Anesthesia Evaluation    Reviewed: Unable to perform ROS - Chart review onlyPreop documentation limited or incomplete due to emergent nature of procedure.  History of Anesthesia Complications Negative for: history of anesthetic complications  Airway        Dental   Pulmonary Current Smoker,  Covid-19 Nucleic Acid Test Results Lab Results      Component                Value               Date                      SARSCOV2NAA              NEGATIVE            10/19/2020                     Cardiovascular hypertension,      Neuro/Psych negative neurological ROS     GI/Hepatic Recent pancreatitis Free air    Endo/Other  Hyponatremia 124  Renal/GU Lab Results      Component                Value               Date                      CREATININE               1.19                10/27/2020            Lab Results      Component                Value               Date                      NA                       124 (L)             10/16/2020                K                        4.7                 11/03/2020                CO2                      26                  10/20/2020                GLUCOSE                  199 (H)             11/10/2020                BUN                      26 (H)  10/16/2020                CREATININE               1.19                11/07/2020                CALCIUM                  8.1 (L)             10/23/2020                GFRNONAA                 >60                 11/04/2020                Musculoskeletal   Abdominal   Peds  Hematology  (+) Blood dyscrasia, anemia , Lab Results      Component                Value               Date                      WBC                      29.9 (H)            10/18/2020                HGB                      11.4 (L)            11/03/2020                HCT                      34.1 (L)            11/01/2020                 MCV                      107.9 (H)           10/15/2020                PLT                      322                 11/07/2020           Lab Results      Component                Value               Date                      INR                      2.1 (H)             11/10/2020  Anesthesia Other Findings   Reproductive/Obstetrics                             Anesthesia Physical Anesthesia Plan  ASA: 4 and emergent  Anesthesia Plan: General   Post-op Pain Management:    Induction: Intravenous, Rapid sequence and Cricoid pressure planned  PONV Risk Score and Plan: 1 and Ondansetron and Dexamethasone  Airway Management Planned: Oral ETT  Additional Equipment:   Intra-op Plan:   Post-operative Plan: Possible Post-op intubation/ventilation  Informed Consent:     History available from chart only and Only emergency history available  Plan Discussed with: CRNA, Anesthesiologist and Surgeon  Anesthesia Plan Comments:         Anesthesia Quick Evaluation

## 2020-10-21 NOTE — ED Provider Notes (Signed)
Emergency Medicine Provider Triage Evaluation Note  Paul Chambers , a 55 y.o. male  was evaluated in triage.  Pt complains of AMS. Wife states patient seen at [facility] for increased leg swelling on 9/2, d/c to home. Since then she reports patient has been "out of it", he is not eating/drinking, increased bilateral leg swelling. Had a fall and head injury earlier today, no LOC. Does not take blood thinners.  Review of Systems  Positive: SOB, bilateral leg swelling, abdominal distention Negative: CP, weakness   Physical Exam  BP 119/77   Pulse (!) 110   Temp 99.4 F (37.4 C) (Oral)   Resp 16   SpO2 (!) 89%  Gen:   Awake, no distress   Resp:  Normal effort  MSK:   Moves extremities without difficulty  Other:  5/5 bilateral grip strength, sensation in tact Small abrasion to occiput of head 3+ pitting edema to level of knee on bilateral legs Abdominal distention without tenderness  Medical Decision Making  Medically screening exam initiated at 3:22 PM.  Appropriate orders placed.  Paul Chambers was informed that the remainder of the evaluation will be completed by another provider, this initial triage assessment does not replace that evaluation, and the importance of remaining in the ED until their evaluation is complete.     Jeanella Flattery 11-01-2020 1525    Tegeler, Canary Brim, MD 2020/11/01 1640

## 2020-10-21 NOTE — H&P (Signed)
NAME:  Paul Chambers MRN:  [id number] DOB:  06-Dec-1965 LOS: 0 ADMISSION DATE:  11/05/2020 DATE OF SERVICE:  11/03/2020  CHIEF COMPLAINT:  abdominal pain   HISTORY & PHYSICAL  History of Present Illness  This 55 y.o. Caucasian male smoker presented to the [facility] Emergency Department via private vehicle with complaints of abdominal pain and altered mental status. At the time of clinical interview, the patient is quite somnolent, snoring loudly. He is maintaining his airway but not able to contribute meaningfully to clinical interview.  The patient's wife reports that this is the third presentation to a healthcare facility in the past month for abdominal pain ([facility], [facility] and now [facility]).  The patient's deteriorating mental status over the past 2 days prompted the wife to force the patient to come to the hospital.  In the ER tonight, CT head was negative for acute process, but CT abdomen showed an impressive pneumoperitoneum without an obvious location of perforation.  Surgery has already evaluated the patient and decided to reocmmend proceeding to surgery; the patient's wife provided consent.  The patient's aunt recently died due to complications of cholangiocarcinoma.  REVIEW OF SYSTEMS This patient is critically ill and cannot provide additional history nor review of systems due to mental status/unconsciousness.   Past Medical/Surgical/Social/Family History   Past Medical History:  Diagnosis Date   Alcoholism (HCC)    Gout    Hypertension    Pancreatitis    Tobacco abuse    History reviewed. No pertinent surgical history.  Social History   Tobacco Use   Smoking status: Every Day    Types: Cigarettes   Smokeless tobacco: Not on file  Substance Use Topics   Alcohol use: Not Currently    Comment: quit drinking whiskey 2 weeks ago   Family History  Problem Relation Age of Onset   Diverticulitis Mother    Liver cancer Maternal Aunt         cholangiocarcinoma     Procedures:     Significant Diagnostic Tests:     Micro Data:  No results found for this or any previous visit.    Antimicrobials:  Zosyn/vancomycin (9/8>>)    Interim history/subjective:     Objective   BP 120/74   Pulse 99   Temp 99.4 F (37.4 C) (Oral)   Resp (!) 21   SpO2 96%     There were no vitals filed for this visit.  Intake/Output Summary (Last 24 hours) at 11/08/2020 2203 Last data filed at 11/09/2020 1903 Gross per 24 hour  Intake 40.89 ml  Output --  Net 40.89 ml        Examination: GENERAL:  lethargic/drowsy, arousable to voice, obese. No acute distress. HEAD: normocephalic, atraumatic EYE: PERRLA, EOM intact, no scleral icterus, no pallor. THROAT/ORAL CAVITY: Normal dentition. No oral thrush. No exudate. Mucous membranes are moist. No tonsillar enlargement. III (soft and hard palate and base of uvula visible) airway. NECK: supple, no thyromegaly, no JVD, no lymphadenopathy. Trachea midline. CHEST/LUNG: symmetric in development and expansion. Good air entry. No crackles. No wheezes. HEART: Regular S1 and S2 without murmur, rub or gallop. ABDOMEN: distended, diffusely tender with rebound. No guarding. Hypoactive, nearly silent bowel sounds. EXTREMITIES: Edema: 4+ and pitting. No cyanosis. No clubbing. 2+ DP pulses LYMPHATIC: no cervical/axillary/inguinal lymph nodes appreciated MUSCULOSKELETAL: No point tenderness. No bulk atrophy. Joints: normal inspection.  SKIN:  No rash or lesion. NEUROLOGIC: Doll's eyes intact. Corneal reflex intact. Spontaneous respirations intact. Cranial  nerves II-XII are grossly symmetric and physiologic. Babinski absent. No sensory deficit. Motor: 5/5 @ RUE, 5/5 @ LUE, 5/5 @ RLL,  5/5 @ LLL.  DTR: 2+ @ R biceps, 2+ @ L biceps, 2+ @ R patellar,  2+ @ L patellar. No cerebellar signs. Gait was not assessed.   Resolved Hospital Problem list      Assessment & Plan:   ASSESSMENT/PLAN:  ASSESSMENT  (included in the Hospital Problem List)  Principal Problem:   Severe sepsis (HCC) Active Problems:   Perforated abdominal viscus   Toxic encephalopathy   Hyponatremia   Hypoalbuminemia   Macrocytic anemia   By systems: INFECTIOUS Peritonitis Empiric Zosyn/Flagyl/vancomycin/fluconazole Anticipating trip to operating theater  GASTROINTESTINAL Perforated viscus Pneumoperitoneum NPO now Albumin infusion Case discussed with Dr. Sheliah Hatch GI PROPHYLAXIS: Protonix  RENAL Hyponatremia LR @ 125 mL/hr Place Foley catheter Monitor urine output   PULMONARY: No acute issues Check ABG Supplemental oxygen as needed to maintain SpO2 93+% I anticipate this patient will likely return from the operating theater on mechanical ventilatory support   CARDIOVASCULAR: No acute issues Hemodynamic monitoring per ICU protocol  HEMATOLOGIC Macrocytic anemia DVT PROPHYLAXIS: SCDs, heparin starting tomorrow   ENDOCRINE: No acute issues Check hemoglobin A1c   NEUROLOGIC Altered mental status Toxic encephalopathy Monitor neurologic status   PLAN/RECOMMENDATIONS  Admit to ICU under my service (Attending: Marcelle Smiling, MD) with the diagnoses highlighted above in the active Hospital Problem List (ASSESSMENT). As noted above See orders    My assessment, plan of care, findings, medications, side effects, etc. were discussed with: nurse and Dr. Sheliah Hatch (General Surgery).   Best practice:  Diet: NPO Pain/Anxiety/Delirium protocol (if indicated): N/A VAP protocol (if indicated): N/A DVT prophylaxis: SCDs for now; heparin after surgery (if OK with General Surgery) GI prophylaxis: Protonix Glucose control: N/A Mobility/Activity: bedrest   Code Status: Full Code Family Communication:  patient's family (wife updated at bedside) Disposition: admit to ICU   Labs   CBC: Recent Labs  Lab 11/03/2020 1506  WBC 29.9*  HGB 11.4*  HCT 34.1*  MCV 107.9*  PLT 322    Basic Metabolic  Panel: Recent Labs  Lab 11/06/2020 1506  NA 124*  K 4.7  CL 84*  CO2 26  GLUCOSE 199*  BUN 26*  CREATININE 1.19  CALCIUM 8.1*   GFR: CrCl cannot be calculated (Unknown ideal weight.). Recent Labs  Lab 10/29/2020 1506  WBC 29.9*    Liver Function Tests: Recent Labs  Lab 11/11/2020 1506  AST 76*  ALT 53*  ALKPHOS 117  BILITOT 1.4*  PROT 6.1*  ALBUMIN 1.7*   Recent Labs  Lab 11/08/2020 1506  LIPASE 25   No results for input(s): AMMONIA in the last 168 hours.  ABG No results found for: PHART, PCO2ART, PO2ART, HCO3, TCO2, ACIDBASEDEF, O2SAT   Coagulation Profile: No results for input(s): INR, PROTIME in the last 168 hours.  Cardiac Enzymes: Recent Labs  Lab 11/04/2020 1550  CKTOTAL 27*    HbA1C: No results found for: HGBA1C  CBG: Recent Labs  Lab 11/10/2020 1456  GLUCAP 206*     Past Medical History  No past medical history on file.    Surgical History   History reviewed. No pertinent surgical history.    Social History   Social History   Socioeconomic History   Marital status: Married    Spouse name: Not on file   Number of children: Not on file   Years of education: Not on file   Highest  education level: Not on file  Occupational History   Not on file  Tobacco Use   Smoking status: Every Day    Types: Cigarettes   Smokeless tobacco: Not on file  Substance and Sexual Activity   Alcohol use: Not Currently    Comment: quit drinking whiskey 2 weeks ago   Drug use: Not on file   Sexual activity: Not on file  Other Topics Concern   Not on file  Social History Narrative   Not on file   Social Determinants of Health   Financial Resource Strain: Not on file  Food Insecurity: Not on file  Transportation Needs: Not on file  Physical Activity: Not on file  Stress: Not on file  Social Connections: Not on file      Family History   No family history on file. family history is not on file.    Allergies No Known Allergies    Current  Medications  Current Facility-Administered Medications:    albumin human 25 % solution 25 g, 25 g, Intravenous, Once, Eubanks, Katalina M, NP   albuterol (PROVENTIL) (2.5 MG/3ML) 0.083% nebulizer solution 3 mL, 3 mL, Inhalation, Q6H PRN, Janyth Contes, Ozzie Hoyle, NP   docusate sodium (COLACE) capsule 100 mg, 100 mg, Oral, BID PRN, Tobey Grim, NP   fluconazole (DIFLUCAN) IVPB 400 mg, 400 mg, Intravenous, Q24H, Marcelle Smiling, MD   [START ON 10/22/2020] Fluticasone-Umeclidin-Vilant 100-62.5-25 MCG/INH AEPB 1 puff, 1 puff, Inhalation, Daily, Jovita Kussmaul M, NP   [START ON 10/22/2020] heparin injection 5,000 Units, 5,000 Units, Subcutaneous, Q8H, Eubanks, Katalina M, NP   insulin aspart (novoLOG) injection 0-15 Units, 0-15 Units, Subcutaneous, Q4H, Eubanks, Katalina M, NP   lactated ringers infusion, , Intravenous, Continuous, Eubanks, Katalina M, NP   metroNIDAZOLE (FLAGYL) IVPB 500 mg, 500 mg, Intravenous, Q8H, Marcelle Smiling, MD   pantoprazole (PROTONIX) injection 40 mg, 40 mg, Intravenous, Q12H, Eubanks, Katalina M, NP   polyethylene glycol (MIRALAX / GLYCOLAX) packet 17 g, 17 g, Oral, Daily PRN, Tobey Grim, NP  Current Outpatient Medications:    acetaminophen (TYLENOL) 500 MG tablet, Take 500 mg by mouth every 6 (six) hours as needed for mild pain., Disp: , Rfl:    albuterol (VENTOLIN HFA) 108 (90 Base) MCG/ACT inhaler, Inhale 1-2 puffs into the lungs every 6 (six) hours as needed for wheezing or shortness of breath., Disp: , Rfl:    allopurinol (ZYLOPRIM) 300 MG tablet, Take 300 mg by mouth daily., Disp: , Rfl:    ALPRAZolam (XANAX) 0.5 MG tablet, Take 0.5 mg by mouth 3 (three) times daily as needed for anxiety., Disp: , Rfl:    amLODipine (NORVASC) 5 MG tablet, Take 5 mg by mouth daily., Disp: , Rfl:    Fluticasone-Umeclidin-Vilant (TRELEGY ELLIPTA) 100-62.5-25 MCG/INH AEPB, Inhale 1 puff into the lungs daily., Disp: , Rfl:    folic acid (FOLVITE) 1 MG tablet, Take 1 mg by  mouth daily., Disp: , Rfl:    furosemide (LASIX) 40 MG tablet, Take 40 mg by mouth 2 (two) times daily., Disp: , Rfl:    Multiple Vitamins-Minerals (PRESERVISION AREDS 2+MULTI VIT PO), Take 2 tablets by mouth daily., Disp: , Rfl:    Omega-3 Krill Oil 500 MG CAPS, Take 1,000 mg by mouth daily., Disp: , Rfl:    omeprazole (PRILOSEC) 40 MG capsule, Take 40 mg by mouth daily., Disp: , Rfl:    oxyCODONE (OXY IR/ROXICODONE) 5 MG immediate release tablet, Take 2.5-5 mg by mouth every 6 (six) hours as  needed for severe pain., Disp: , Rfl:    thiamine (VITAMIN B-1) 100 MG tablet, Take 100 mg by mouth daily., Disp: , Rfl:    Home Medications  Prior to Admission medications   Medication Sig Start Date End Date Taking? Authorizing Provider  acetaminophen (TYLENOL) 500 MG tablet Take 500 mg by mouth every 6 (six) hours as needed for mild pain.   Yes [provider]  albuterol (VENTOLIN HFA) 108 (90 Base) MCG/ACT inhaler Inhale 1-2 puffs into the lungs every 6 (six) hours as needed for wheezing or shortness of breath.   Yes [provider]  allopurinol (ZYLOPRIM) 300 MG tablet Take 300 mg by mouth daily.   Yes [provider]  ALPRAZolam Prudy Feeler(XANAX) 0.5 MG tablet Take 0.5 mg by mouth 3 (three) times daily as needed for anxiety.   Yes [provider]  amLODipine (NORVASC) 5 MG tablet Take 5 mg by mouth daily.   Yes [provider]  Fluticasone-Umeclidin-Vilant (TRELEGY ELLIPTA) 100-62.5-25 MCG/INH AEPB Inhale 1 puff into the lungs daily.   Yes [provider]  folic acid (FOLVITE) 1 MG tablet Take 1 mg by mouth daily.   Yes [provider]  furosemide (LASIX) 40 MG tablet Take 40 mg by mouth 2 (two) times daily.   Yes [provider]  Multiple Vitamins-Minerals (PRESERVISION AREDS 2+MULTI VIT PO) Take 2 tablets by mouth daily.   Yes [provider]  Omega-3 Krill Oil 500 MG CAPS Take 1,000 mg by mouth daily.   Yes [provider]  omeprazole (PRILOSEC) 40 MG capsule Take 40 mg by mouth daily.   Yes [provider]  oxyCODONE (OXY IR/ROXICODONE) 5 MG immediate release tablet Take 2.5-5 mg by mouth every 6 (six) hours as needed for severe pain.   Yes [provider]  thiamine (VITAMIN B-1) 100 MG tablet Take 100 mg by mouth daily.   Yes [provider]      Critical care time: 45 minutes.  The treatment and management of the patient's condition was required based on the threat of imminent deterioration. This time reflects time spent by the physician evaluating, providing care and managing the critically ill patient's care. The time was spent at the immediate bedside (or on the same floor/unit and dedicated to this patient's care). Time involved in separately billable procedures is NOT included int he critical care time indicated above. Family meeting and update time may be included above if and only if the patient is unable/incompetent to participate in clinical interview and/or decision making, and the discussion was necessary to determining treatment decisions.   Marcelle SmilingSeong-Joo Nohelia Valenza, MD Board Certified by the [employer]IM, Pulmonary Diseases & Critical Care Medicine

## 2020-10-21 NOTE — ED Provider Notes (Signed)
[facility] EMERGENCY DEPARTMENT Provider Note   CSN: [id number] Arrival date & time: 10/30/2020  1436     History Chief Complaint  Patient presents with   Altered Mental Status    Paul Chambers is a 55 y.o. male.  HPI Patient presents with his wife who provides much of the history. The patient does answer some questions but with inconsistent veracity, and audibility. See me the patient has a history of alcohol use, and her this year began with general decline.  He has been hospitalized several times in several locations, has a primary care physician in another part of the state, has no local physicians. Wife is from this area, and after patient has had persistent decline in terms of interactivity, swelling, fatigue, dyspnea, she brings him here for evaluation. Level 5 caveat secondary to acuity/mental status change. According to wife the patient stopped drinking earlier this year, just prior to a hospitalization that lasted about 1 week.  During that hospitalization he reportedly had acute renal failure.  He improved somewhat, has been home, though in and out of hospitals since that time.  He was reportedly started on home oxygen yesterday by his primary care physician. Patient cannot participate substantially in the history.    Past medical: Alcohol use Gout Chronic back pain knee: 1, current cigarette smoker, former drinker.  Home Medications Prior to Admission medications   Medication Sig Start Date End Date Taking? Authorizing Provider  acetaminophen (TYLENOL) 500 MG tablet Take 500 mg by mouth every 6 (six) hours as needed for mild pain.   Yes [provider]  albuterol (VENTOLIN HFA) 108 (90 Base) MCG/ACT inhaler Inhale 1-2 puffs into the lungs every 6 (six) hours as needed for wheezing or shortness of breath.   Yes [provider]  allopurinol (ZYLOPRIM) 300 MG tablet Take 300 mg by mouth daily.   Yes [provider]  ALPRAZolam  Prudy Feeler) 0.5 MG tablet Take 0.5 mg by mouth 3 (three) times daily as needed for anxiety.   Yes [provider]  amLODipine (NORVASC) 5 MG tablet Take 5 mg by mouth daily.   Yes [provider]  Fluticasone-Umeclidin-Vilant (TRELEGY ELLIPTA) 100-62.5-25 MCG/INH AEPB Inhale 1 puff into the lungs daily.   Yes [provider]  folic acid (FOLVITE) 1 MG tablet Take 1 mg by mouth daily.   Yes [provider]  furosemide (LASIX) 40 MG tablet Take 40 mg by mouth 2 (two) times daily.   Yes [provider]  Multiple Vitamins-Minerals (PRESERVISION AREDS 2+MULTI VIT PO) Take 2 tablets by mouth daily.   Yes [provider]  Omega-3 Krill Oil 500 MG CAPS Take 1,000 mg by mouth daily.   Yes [provider]  omeprazole (PRILOSEC) 40 MG capsule Take 40 mg by mouth daily.   Yes [provider]  oxyCODONE (OXY IR/ROXICODONE) 5 MG immediate release tablet Take 2.5-5 mg by mouth every 6 (six) hours as needed for severe pain.   Yes [provider]  thiamine (VITAMIN B-1) 100 MG tablet Take 100 mg by mouth daily.   Yes [provider]    Allergies    Patient has no known allergies.  Review of Systems   Review of Systems  Unable to perform ROS: Acuity of condition   Physical Exam Updated Vital Signs BP 120/74   Pulse 99   Temp 99.4 F (37.4 C) (Oral)   Resp (!) 21   SpO2 96%   Physical Exam Vitals and  nursing note reviewed.  Constitutional:      Appearance: He is well-developed. He is obese. He is ill-appearing.  HENT:     Head: Normocephalic and atraumatic.  Eyes:     Conjunctiva/sclera: Conjunctivae normal.  Cardiovascular:     Rate and Rhythm: Regular rhythm. Tachycardia present.  Pulmonary:     Effort: Pulmonary effort is normal. No respiratory distress.     Breath sounds: No stridor.  Abdominal:     General: There is no distension.     Comments: Protuberant abdomen, no guarding, no peritonitis.  Patient  seems to indicate he may have pain in the lower abdomen.  Musculoskeletal:        General: No deformity.     Right lower leg: Edema present.     Left lower leg: Edema present.  Skin:    General: Skin is warm and dry.     Coloration: Skin is not jaundiced.  Neurological:     Mental Status: He is alert.     Comments: Patient does move all extremities spontaneously, follows commands slowly, but inconsistently.  He tracks visually with eyes open, inconsistently.  Speech is brief, quiet, inconsistently accurate.  Psychiatric:        Cognition and Memory: Cognition is impaired. Memory is impaired.    ED Results / Procedures / Treatments   Labs (all labs ordered are listed, but only abnormal results are displayed) Labs Reviewed  COMPREHENSIVE METABOLIC PANEL - Abnormal; Notable for the following components:      Result Value   Sodium 124 (*)    Chloride 84 (*)    Glucose, Bld 199 (*)    BUN 26 (*)    Calcium 8.1 (*)    Total Protein 6.1 (*)    Albumin 1.7 (*)    AST 76 (*)    ALT 53 (*)    Total Bilirubin 1.4 (*)    All other components within normal limits  CBC - Abnormal; Notable for the following components:   WBC 29.9 (*)    RBC 3.16 (*)    Hemoglobin 11.4 (*)    HCT 34.1 (*)    MCV 107.9 (*)    MCH 36.1 (*)    nRBC 0.3 (*)    All other components within normal limits  BRAIN NATRIURETIC PEPTIDE - Abnormal; Notable for the following components:   B Natriuretic Peptide 139.2 (*)    All other components within normal limits  URINALYSIS, ROUTINE W REFLEX MICROSCOPIC - Abnormal; Notable for the following components:   Color, Urine AMBER (*)    Hgb urine dipstick TRACE (*)    Bilirubin Urine SMALL (*)    Ketones, ur 15 (*)    All other components within normal limits  LACTATE DEHYDROGENASE - Abnormal; Notable for the following components:   LDH 323 (*)    All other components within normal limits  CK - Abnormal; Notable for the following components:   Total CK 27 (*)     All other components within normal limits  CBG MONITORING, ED - Abnormal; Notable for the following components:   Glucose-Capillary 206 (*)    All other components within normal limits  RESP PANEL BY RT-PCR (FLU A&B, COVID) ARPGX2  CULTURE, BLOOD (ROUTINE X 2)  CULTURE, BLOOD (ROUTINE X 2)  MRSA NEXT GEN BY PCR, NASAL  LIPASE, BLOOD  ETHANOL  URINALYSIS, MICROSCOPIC (REFLEX)  AMMONIA  PROTIME-INR  HIV ANTIBODY (ROUTINE TESTING W REFLEX)  HEMOGLOBIN A1C  BASIC METABOLIC PANEL  CBC  MAGNESIUM  PHOSPHORUS  LACTIC ACID, PLASMA  PROCALCITONIN  PROCALCITONIN  SODIUM, URINE, RANDOM  OSMOLALITY, URINE  OSMOLALITY  MAGNESIUM  PHOSPHORUS  SODIUM  SODIUM  SODIUM    EKG EKG Interpretation  Date/Time:  Thursday October 21 2020 14:48:38 EDT Ventricular Rate:  110 PR Interval:  150 QRS Duration: 92 QT Interval:  318 QTC Calculation: 430 R Axis:   -13 Text Interpretation: Sinus tachycardia Possible Left atrial enlargement Cannot rule out Anterior infarct , age undetermined Abnormal ECG Confirmed by Gerhard Munch [phone]) on 10/19/2020 5:03:47 PM  Radiology CT Head Wo Contrast  Result Date: 11/10/2020 CLINICAL DATA:  Altered mental status. EXAM: CT HEAD WITHOUT CONTRAST TECHNIQUE: Contiguous axial images were obtained from the base of the skull through the vertex without intravenous contrast. COMPARISON:  None. FINDINGS: Brain: No evidence of acute infarction, hemorrhage, hydrocephalus, extra-axial collection or mass lesion/mass effect. Vascular: No hyperdense vessel or unexpected calcification. Skull: Normal. Negative for fracture or focal lesion. Sinuses/Orbits: No acute finding. Other: None. IMPRESSION: No acute intracranial abnormality seen. Electronically Signed   By: Lupita Raider M.D.   On: 10/26/2020 18:56   CT ABDOMEN PELVIS W CONTRAST  Result Date: 10/16/2020 CLINICAL DATA:  Abdominal abscess/infection suspected EXAM: CT ABDOMEN AND PELVIS WITH CONTRAST TECHNIQUE:  Multidetector CT imaging of the abdomen and pelvis was performed using the standard protocol following bolus administration of intravenous contrast. CONTRAST:  OMNIPAQUE IOHEXOL 350 MG/ML SOLN COMPARISON:  None. FINDINGS: Lower chest: Small left pleural effusion. Bilateral lower lobe airspace opacities, left greater than right. Hepatobiliary: No focal hepatic abnormality. Gallbladder unremarkable. Pancreas: Pancreas is poorly visualized due to extensive fluid and gas dissecting throughout the retroperitoneum. Fluid in the region of the pancreatic body and tail. Spleen: No focal abnormality.  Normal size. Adrenals/Urinary Tract: No adrenal abnormality. No focal renal abnormality. No stones or hydronephrosis. Urinary bladder is unremarkable. Stomach/Bowel: There is extensive gas and fluid dissecting throughout the retroperitoneum. This continues into the peritoneum with pneumoperitoneum and ascites. This most likely reflects perforated hollow viscus. Given the degree of involvement of the retroperitoneum, I would suspect a retroperitoneal perforation, possibly perforated duodenal ulcer. Other possible source would be the descending duodenum although no real concerning appearance of the descending duodenum. Less likely but possible would be perforated peritoneal bowel. Vascular/Lymphatic: No evidence of aneurysm or adenopathy. Reproductive: No visible focal abnormality. Other: Extensive retroperitoneal fluid and gas as well as pneumoperitoneum and ascites as described above. Musculoskeletal: No acute bony abnormality. IMPRESSION: Extensive gas and fluid dissecting throughout the retroperitoneum and likely extending into the peritoneum where there is pneumoperitoneum and ascites. Appearance is most compatible with perforated bowel, likely in the retroperitoneum with duodenal the most likely source although exact source is not readily apparent. Pancreas poorly visualized with fluid and gas in the region of the body  and tail. While necrotizing pancreatitis could have this appearance, this is felt less likely given the extent of retroperitoneal gas and fluid and a normal lipase. Critical Value/emergent results were called by telephone at the time of interpretation on 11/09/2020 at 8:42 pm to provider Gerhard Munch , who verbally acknowledged these results. Electronically Signed   By: Charlett Nose M.D.   On: 11/08/2020 20:49   DG Chest Port 1 View  Result Date: 10/18/2020 CLINICAL DATA:  sob EXAM: PORTABLE CHEST 1 VIEW COMPARISON:  None. FINDINGS: The cardiomediastinal silhouette is within normal limits. No pleural effusion. No pneumothorax. Left lung base consolidation. No acute osseous abnormality. IMPRESSION: Left  lung base consolidation suggestive of pneumonia in the appropriate clinical context. Electronically Signed   By: Olive Bass M.D.   On: 10/31/2020 16:33    Procedures Procedures   Medications Ordered in ED Medications  metroNIDAZOLE (FLAGYL) IVPB 500 mg (has no administration in time range)  fluconazole (DIFLUCAN) IVPB 400 mg (has no administration in time range)  docusate sodium (COLACE) capsule 100 mg (has no administration in time range)  polyethylene glycol (MIRALAX / GLYCOLAX) packet 17 g (has no administration in time range)  heparin injection 5,000 Units (has no administration in time range)  insulin aspart (novoLOG) injection 0-15 Units (has no administration in time range)  pantoprazole (PROTONIX) injection 40 mg (has no administration in time range)  albuterol (PROVENTIL) (2.5 MG/3ML) 0.083% nebulizer solution 3 mL (has no administration in time range)  Fluticasone-Umeclidin-Vilant 100-62.5-25 MCG/INH AEPB 1 puff (has no administration in time range)  albumin human 25 % solution 25 g (has no administration in time range)  lactated ringers infusion (has no administration in time range)  folic acid injection 1 mg (has no administration in time range)  thiamine (B-1) injection 100 mg  (has no administration in time range)  fentaNYL (SUBLIMAZE) injection 25 mcg (25 mcg Intravenous Given 10/20/2020 1633)  vancomycin (VANCOREADY) IVPB 2000 mg/400 mL (0 mg Intravenous Stopped 10/29/2020 2113)  piperacillin-tazobactam (ZOSYN) IVPB 3.375 g (0 g Intravenous Stopped 10/16/2020 1903)  iohexol (OMNIPAQUE) 350 MG/ML injection 100 mL (100 mLs Intravenous Contrast Given 11/04/2020 1829)  LORazepam (ATIVAN) injection 0.5 mg (0.5 mg Intravenous Given 10/24/2020 1934)    ED Course  I have reviewed the triage vital signs and the nursing notes.  Pertinent labs & imaging results that were available during my care of the patient were reviewed by me and considered in my medical decision making (see chart for details).  Initial labs notable for leukocytosis almost 30,000.  X-ray consistent with pneumonia.  Patient started on broad-spectrum antibiotics, additional studies, including CT head, abdomen pelvis pending. On repeat exam the patient's abdomen is tender.  I have reviewed his CT scan, and discussed him with our radiologist.  Subsequently discussed the radiology findings with our surgeon.  Update:, Inks, patient is well, though he remains somewhat confused, and comprehension is questionable. I discussed this case with our critical care colleagues for admission as well.  Adult male presents with semiencephalopathy, subjective fever, headache.  Patient is new to our system, but reportedly has a history of alcohol use, recent decline in condition and initial considerations here including encephalopathy, COVID, pneumonia, intra-abdominal infection, decompensated liver/renal function evaluated with CT, x-ray, labs.  With initial suspicion for infection patient was on broad-spectrum antibiotics.  Patient's findings most notable for demonstration of left-sided pneumonia and substantial pneumoperitoneum with free fluid in his abdomen both peritoneum and retroperitoneum.  Patient required admission with anticipated surgery  later today. MDM Rules/Calculators/A&P MDM Number of Diagnoses or Management Options Community acquired pneumonia of left lower lobe of lung: new, needed workup Encephalopathy: new, needed workup Perforated bowel (HCC): new, needed workup   Amount and/or Complexity of Data Reviewed Clinical lab tests: ordered and reviewed Tests in the radiology section of CPT: ordered and reviewed Tests in the medicine section of CPT: reviewed and ordered Discussion of test results with the performing providers: yes Decide to obtain previous medical records or to obtain history from someone other than the patient: yes Obtain history from someone other than the patient: yes Discuss the patient with other providers: yes Independent visualization of images,  tracings, or specimens: yes  Risk of Complications, Morbidity, and/or Mortality Presenting problems: high Diagnostic procedures: high Management options: high  Critical Care Total time providing critical care: 30-74 minutes (45)  Patient Progress Patient progress: stable   Final Clinical Impression(s) / ED Diagnoses Final diagnoses:  Perforated bowel (HCC)  Encephalopathy  Community acquired pneumonia of left lower lobe of lung     Gerhard Munch, MD 10/26/2020 2212

## 2020-10-21 NOTE — ED Triage Notes (Signed)
Arrived POV c/o altered mental status on and off since Tuesday; c/o abdominal pain since last Wednesday.

## 2020-10-22 ENCOUNTER — Encounter (HOSPITAL_COMMUNITY): Payer: Self-pay | Admitting: General Surgery

## 2020-10-22 ENCOUNTER — Inpatient Hospital Stay (HOSPITAL_COMMUNITY): Payer: BC Managed Care – PPO

## 2020-10-22 DIAGNOSIS — G9340 Encephalopathy, unspecified: Secondary | ICD-10-CM

## 2020-10-22 DIAGNOSIS — E8809 Other disorders of plasma-protein metabolism, not elsewhere classified: Secondary | ICD-10-CM | POA: Diagnosis not present

## 2020-10-22 DIAGNOSIS — L8990 Pressure ulcer of unspecified site, unspecified stage: Secondary | ICD-10-CM | POA: Diagnosis not present

## 2020-10-22 DIAGNOSIS — A419 Sepsis, unspecified organism: Secondary | ICD-10-CM | POA: Diagnosis not present

## 2020-10-22 DIAGNOSIS — K8591 Acute pancreatitis with uninfected necrosis, unspecified: Secondary | ICD-10-CM | POA: Diagnosis not present

## 2020-10-22 LAB — COMPREHENSIVE METABOLIC PANEL
ALT: 43 U/L (ref 0–44)
AST: 75 U/L — ABNORMAL HIGH (ref 15–41)
Albumin: 2 g/dL — ABNORMAL LOW (ref 3.5–5.0)
Alkaline Phosphatase: 122 U/L (ref 38–126)
Anion gap: 14 (ref 5–15)
BUN: 33 mg/dL — ABNORMAL HIGH (ref 6–20)
CO2: 21 mmol/L — ABNORMAL LOW (ref 22–32)
Calcium: 7.8 mg/dL — ABNORMAL LOW (ref 8.9–10.3)
Chloride: 90 mmol/L — ABNORMAL LOW (ref 98–111)
Creatinine, Ser: 1.86 mg/dL — ABNORMAL HIGH (ref 0.61–1.24)
GFR, Estimated: 43 mL/min — ABNORMAL LOW (ref >60)
Glucose, Bld: 174 mg/dL — ABNORMAL HIGH (ref 70–99)
Potassium: 5 mmol/L (ref 3.5–5.1)
Sodium: 125 mmol/L — ABNORMAL LOW (ref 135–145)
Total Bilirubin: 1.6 mg/dL — ABNORMAL HIGH (ref 0.3–1.2)
Total Protein: 5 g/dL — ABNORMAL LOW (ref 6.5–8.1)

## 2020-10-22 LAB — BASIC METABOLIC PANEL
Anion gap: 14 (ref 5–15)
Anion gap: 12 (ref 5–15)
BUN: 30 mg/dL — ABNORMAL HIGH (ref 6–20)
BUN: 34 mg/dL — ABNORMAL HIGH (ref 6–20)
CO2: 23 mmol/L (ref 22–32)
CO2: 24 mmol/L (ref 22–32)
Calcium: 8.4 mg/dL — ABNORMAL LOW (ref 8.9–10.3)
Calcium: 7.8 mg/dL — ABNORMAL LOW (ref 8.9–10.3)
Chloride: 89 mmol/L — ABNORMAL LOW (ref 98–111)
Chloride: 90 mmol/L — ABNORMAL LOW (ref 98–111)
Creatinine, Ser: 1.29 mg/dL — ABNORMAL HIGH (ref 0.61–1.24)
Creatinine, Ser: 1.92 mg/dL — ABNORMAL HIGH (ref 0.61–1.24)
GFR, Estimated: >60 mL/min (ref >60)
GFR, Estimated: 41 mL/min — ABNORMAL LOW (ref >60)
Glucose, Bld: 205 mg/dL — ABNORMAL HIGH (ref 70–99)
Glucose, Bld: 185 mg/dL — ABNORMAL HIGH (ref 70–99)
Potassium: 5 mmol/L (ref 3.5–5.1)
Potassium: 5 mmol/L (ref 3.5–5.1)
Sodium: 126 mmol/L — ABNORMAL LOW (ref 135–145)
Sodium: 126 mmol/L — ABNORMAL LOW (ref 135–145)

## 2020-10-22 LAB — POCT I-STAT 7, (LYTES, BLD GAS, ICA,H+H)
Acid-Base Excess: 3 mmol/L — ABNORMAL HIGH (ref 0.0–2.0)
Acid-Base Excess: 3 mmol/L — ABNORMAL HIGH (ref 0.0–2.0)
Bicarbonate: 27.9 mmol/L (ref 20.0–28.0)
Bicarbonate: 26.8 mmol/L (ref 20.0–28.0)
Calcium, Ion: 1.08 mmol/L — ABNORMAL LOW (ref 1.15–1.40)
Calcium, Ion: 1.09 mmol/L — ABNORMAL LOW (ref 1.15–1.40)
HCT: 29 % — ABNORMAL LOW (ref 39.0–52.0)
HCT: 27 % — ABNORMAL LOW (ref 39.0–52.0)
Hemoglobin: 9.9 g/dL — ABNORMAL LOW (ref 13.0–17.0)
Hemoglobin: 9.2 g/dL — ABNORMAL LOW (ref 13.0–17.0)
O2 Saturation: 95 %
O2 Saturation: 100 %
Potassium: 4.4 mmol/L (ref 3.5–5.1)
Potassium: 4.8 mmol/L (ref 3.5–5.1)
Sodium: 125 mmol/L — ABNORMAL LOW (ref 135–145)
Sodium: 125 mmol/L — ABNORMAL LOW (ref 135–145)
TCO2: 29 mmol/L (ref 22–32)
TCO2: 28 mmol/L (ref 22–32)
pCO2 arterial: 44.4 mmHg (ref 32.0–48.0)
pCO2 arterial: 38.4 mmHg (ref 32.0–48.0)
pH, Arterial: 7.406 (ref 7.350–7.450)
pH, Arterial: 7.452 — ABNORMAL HIGH (ref 7.350–7.450)
pO2, Arterial: 78 mmHg — ABNORMAL LOW (ref 83.0–108.0)
pO2, Arterial: 207 mmHg — ABNORMAL HIGH (ref 83.0–108.0)

## 2020-10-22 LAB — CBC
HCT: 26.1 % — ABNORMAL LOW (ref 39.0–52.0)
HCT: 29.1 % — ABNORMAL LOW (ref 39.0–52.0)
HCT: 22.9 % — ABNORMAL LOW (ref 39.0–52.0)
Hemoglobin: 8.6 g/dL — ABNORMAL LOW (ref 13.0–17.0)
Hemoglobin: 9.8 g/dL — ABNORMAL LOW (ref 13.0–17.0)
Hemoglobin: 7.7 g/dL — ABNORMAL LOW (ref 13.0–17.0)
MCH: 35.4 pg — ABNORMAL HIGH (ref 26.0–34.0)
MCH: 35.8 pg — ABNORMAL HIGH (ref 26.0–34.0)
MCH: 36.2 pg — ABNORMAL HIGH (ref 26.0–34.0)
MCHC: 33 g/dL (ref 30.0–36.0)
MCHC: 33.7 g/dL (ref 30.0–36.0)
MCHC: 33.6 g/dL (ref 30.0–36.0)
MCV: 107.4 fL — ABNORMAL HIGH (ref 80.0–100.0)
MCV: 106.2 fL — ABNORMAL HIGH (ref 80.0–100.0)
MCV: 107.5 fL — ABNORMAL HIGH (ref 80.0–100.0)
Platelets: 316 10*3/uL (ref 150–400)
Platelets: 363 10*3/uL (ref 150–400)
Platelets: 250 10*3/uL (ref 150–400)
RBC: 2.43 MIL/uL — ABNORMAL LOW (ref 4.22–5.81)
RBC: 2.74 MIL/uL — ABNORMAL LOW (ref 4.22–5.81)
RBC: 2.13 MIL/uL — ABNORMAL LOW (ref 4.22–5.81)
RDW: 14.6 % (ref 11.5–15.5)
RDW: 14.7 % (ref 11.5–15.5)
RDW: 14.8 % (ref 11.5–15.5)
WBC: 40.9 10*3/uL — ABNORMAL HIGH (ref 4.0–10.5)
WBC: 53.9 10*3/uL (ref 4.0–10.5)
WBC: 39.2 10*3/uL — ABNORMAL HIGH (ref 4.0–10.5)
nRBC: 0.1 % (ref 0.0–0.2)
nRBC: 0.2 % (ref 0.0–0.2)
nRBC: 0.1 % (ref 0.0–0.2)

## 2020-10-22 LAB — HIV ANTIBODY (ROUTINE TESTING W REFLEX): HIV Screen 4th Generation wRfx: NONREACTIVE

## 2020-10-22 LAB — GLUCOSE, CAPILLARY
Glucose-Capillary: 171 mg/dL — ABNORMAL HIGH (ref 70–99)
Glucose-Capillary: 172 mg/dL — ABNORMAL HIGH (ref 70–99)
Glucose-Capillary: 173 mg/dL — ABNORMAL HIGH (ref 70–99)
Glucose-Capillary: 175 mg/dL — ABNORMAL HIGH (ref 70–99)
Glucose-Capillary: 179 mg/dL — ABNORMAL HIGH (ref 70–99)
Glucose-Capillary: 211 mg/dL — ABNORMAL HIGH (ref 70–99)
Glucose-Capillary: 215 mg/dL — ABNORMAL HIGH (ref 70–99)

## 2020-10-22 LAB — MAGNESIUM: Magnesium: 2.2 mg/dL (ref 1.7–2.4)

## 2020-10-22 LAB — MRSA NEXT GEN BY PCR, NASAL: MRSA by PCR Next Gen: NOT DETECTED

## 2020-10-22 LAB — PROTIME-INR
INR: 2.1 — ABNORMAL HIGH (ref 0.8–1.2)
INR: 2 — ABNORMAL HIGH (ref 0.8–1.2)
Prothrombin Time: 23.7 seconds — ABNORMAL HIGH (ref 11.4–15.2)
Prothrombin Time: 22.4 seconds — ABNORMAL HIGH (ref 11.4–15.2)

## 2020-10-22 LAB — LACTIC ACID, PLASMA
Lactic Acid, Venous: 3.8 mmol/L (ref 0.5–1.9)
Lactic Acid, Venous: 4.9 mmol/L (ref 0.5–1.9)
Lactic Acid, Venous: 3.5 mmol/L (ref 0.5–1.9)

## 2020-10-22 LAB — PREPARE RBC (CROSSMATCH)

## 2020-10-22 LAB — ABO/RH: ABO/RH(D): A POS

## 2020-10-22 LAB — PHOSPHORUS: Phosphorus: 6.4 mg/dL — ABNORMAL HIGH (ref 2.5–4.6)

## 2020-10-22 LAB — PROCALCITONIN: Procalcitonin: 21 ng/mL

## 2020-10-22 MED ORDER — LACTATED RINGERS IV BOLUS
1000.0000 mL | Freq: Once | INTRAVENOUS | Status: AC
  Administered 2020-10-22: 1000 mL via INTRAVENOUS

## 2020-10-22 MED ORDER — LIDOCAINE 2% (20 MG/ML) 5 ML SYRINGE
INTRAMUSCULAR | Status: DC | PRN
  Administered 2020-10-21: 60 mg via INTRAVENOUS

## 2020-10-22 MED ORDER — SODIUM CHLORIDE 0.9% FLUSH
10.0000 mL | INTRAVENOUS | Status: DC | PRN
  Administered 2020-10-25: 10 mL

## 2020-10-22 MED ORDER — DOCUSATE SODIUM 50 MG/5ML PO LIQD: 100.0000 mg | Freq: Two times a day (BID) | ORAL | Status: DC | PRN

## 2020-10-22 MED ORDER — LINEZOLID 600 MG/300ML IV SOLN
600.0000 mg | Freq: Two times a day (BID) | INTRAVENOUS | Status: DC
  Administered 2020-10-22 – 2020-10-26 (×10): 600 mg via INTRAVENOUS
  Filled 2020-10-22 (×11): qty 300

## 2020-10-22 MED ORDER — POLYETHYLENE GLYCOL 3350 17 G PO PACK: 17.0000 g | PACK | Freq: Every day | ORAL | Status: DC | PRN

## 2020-10-22 MED ORDER — LACTATED RINGERS IV SOLN: INTRAVENOUS | Status: DC

## 2020-10-22 MED ORDER — CHLORHEXIDINE GLUCONATE 0.12% ORAL RINSE (MEDLINE KIT)
15.0000 mL | Freq: Two times a day (BID) | OROMUCOSAL | Status: DC
  Administered 2020-10-22 – 2020-10-25 (×6): 15 mL via OROMUCOSAL

## 2020-10-22 MED ORDER — PHENYLEPHRINE HCL-NACL 20-0.9 MG/250ML-% IV SOLN
INTRAVENOUS | Status: DC | PRN
  Administered 2020-10-22: 25 ug/min via INTRAVENOUS

## 2020-10-22 MED ORDER — ALBUMIN HUMAN 5 % IV SOLN
INTRAVENOUS | Status: DC | PRN
Start: 2020-10-22 — End: 2020-10-22

## 2020-10-22 MED ORDER — ALBUMIN HUMAN 25 % IV SOLN
25.0000 g | Freq: Four times a day (QID) | INTRAVENOUS | Status: AC
  Administered 2020-10-22 (×2): 25 g via INTRAVENOUS
  Filled 2020-10-22 (×2): qty 100

## 2020-10-22 MED ORDER — BUPIVACAINE HCL 0.25 % IJ SOLN
INTRAMUSCULAR | Status: DC | PRN
  Administered 2020-10-22: 30 mL

## 2020-10-22 MED ORDER — SUCCINYLCHOLINE CHLORIDE 200 MG/10ML IV SOSY
PREFILLED_SYRINGE | INTRAVENOUS | Status: DC | PRN
  Administered 2020-10-21: 120 mg via INTRAVENOUS

## 2020-10-22 MED ORDER — PANTOPRAZOLE SODIUM 40 MG IV SOLR
40.0000 mg | INTRAVENOUS | Status: DC
  Administered 2020-10-23 – 2020-11-27 (×36): 40 mg via INTRAVENOUS
  Filled 2020-10-22 (×37): qty 40

## 2020-10-22 MED ORDER — LACTATED RINGERS IV SOLN: INTRAVENOUS | Status: DC | PRN

## 2020-10-22 MED ORDER — FENTANYL CITRATE (PF) 100 MCG/2ML IJ SOLN: 50.0000 ug | Freq: Once | INTRAMUSCULAR | Status: DC

## 2020-10-22 MED ORDER — SODIUM CHLORIDE 0.9 % IR SOLN
Status: DC | PRN
  Administered 2020-10-22: 1000 mL

## 2020-10-22 MED ORDER — NOREPINEPHRINE 16 MG/250ML-% IV SOLN
0.0000 ug/min | INTRAVENOUS | Status: DC
  Administered 2020-10-22: 22 ug/min via INTRAVENOUS
  Filled 2020-10-22 (×2): qty 250

## 2020-10-22 MED ORDER — SODIUM CHLORIDE 0.9% IV SOLUTION: Freq: Once | INTRAVENOUS | Status: AC

## 2020-10-22 MED ORDER — ORAL CARE MOUTH RINSE
15.0000 mL | OROMUCOSAL | Status: DC
  Administered 2020-10-22 – 2020-10-25 (×35): 15 mL via OROMUCOSAL

## 2020-10-22 MED ORDER — 0.9 % SODIUM CHLORIDE (POUR BTL) OPTIME
TOPICAL | Status: DC | PRN
  Administered 2020-10-22 (×2): 1000 mL

## 2020-10-22 MED ORDER — PROPOFOL 500 MG/50ML IV EMUL
INTRAVENOUS | Status: DC | PRN
  Administered 2020-10-22: 25 ug/kg/min via INTRAVENOUS

## 2020-10-22 MED ORDER — FENTANYL CITRATE (PF) 100 MCG/2ML IJ SOLN: 25.0000 ug | INTRAMUSCULAR | Status: DC | PRN

## 2020-10-22 MED ORDER — SODIUM CHLORIDE 0.9 % IV SOLN: INTRAVENOUS | Status: DC

## 2020-10-22 MED ORDER — EPHEDRINE SULFATE 50 MG/ML IJ SOLN
INTRAMUSCULAR | Status: DC | PRN
  Administered 2020-10-22: 10 mg via INTRAVENOUS

## 2020-10-22 MED ORDER — FENTANYL 2500MCG IN NS 250ML (10MCG/ML) PREMIX INFUSION
50.0000 ug/h | INTRAVENOUS | Status: DC
  Administered 2020-10-22: 200 ug/h via INTRAVENOUS
  Administered 2020-10-22: 50 ug/h via INTRAVENOUS
  Administered 2020-10-23 – 2020-10-24 (×2): 150 ug/h via INTRAVENOUS
  Administered 2020-10-24: 200 ug/h via INTRAVENOUS
  Administered 2020-10-25: 40 ug/h via INTRAVENOUS
  Filled 2020-10-22 (×6): qty 250

## 2020-10-22 MED ORDER — SODIUM CHLORIDE 0.9 % IV SOLN: INTRAVENOUS | Status: DC | PRN

## 2020-10-22 MED ORDER — NOREPINEPHRINE 4 MG/250ML-% IV SOLN
INTRAVENOUS | Status: DC | PRN
Start: 2020-10-22 — End: 2020-10-22
  Administered 2020-10-22: 5 ug/min via INTRAVENOUS

## 2020-10-22 MED ORDER — POLYETHYLENE GLYCOL 3350 17 G PO PACK
17.0000 g | PACK | Freq: Every day | ORAL | Status: DC
  Administered 2020-10-22 – 2020-10-25 (×4): 17 g
  Filled 2020-10-22 (×4): qty 1

## 2020-10-22 MED ORDER — FENTANYL BOLUS VIA INFUSION
50.0000 ug | INTRAVENOUS | Status: DC | PRN
  Administered 2020-10-22: 25 ug via INTRAVENOUS
  Administered 2020-10-22 (×2): 50 ug via INTRAVENOUS
  Administered 2020-10-22: 100 ug via INTRAVENOUS
  Administered 2020-10-22 – 2020-10-23 (×4): 50 ug via INTRAVENOUS
  Filled 2020-10-22: qty 100

## 2020-10-22 MED ORDER — CALCIUM CHLORIDE 10 % IV SOLN
INTRAVENOUS | Status: DC | PRN
  Administered 2020-10-22 (×2): .5 g via INTRAVENOUS

## 2020-10-22 MED ORDER — DOCUSATE SODIUM 50 MG/5ML PO LIQD
100.0000 mg | Freq: Two times a day (BID) | ORAL | Status: DC
  Administered 2020-10-22 – 2020-10-25 (×8): 100 mg
  Filled 2020-10-22 (×8): qty 10

## 2020-10-22 MED ORDER — NOREPINEPHRINE 4 MG/250ML-% IV SOLN
0.0000 ug/min | INTRAVENOUS | Status: DC
  Administered 2020-10-22: 10 ug/min via INTRAVENOUS
  Filled 2020-10-22: qty 250

## 2020-10-22 MED ORDER — PROPOFOL 1000 MG/100ML IV EMUL
0.0000 ug/kg/min | INTRAVENOUS | Status: DC
  Administered 2020-10-22: 25 ug/kg/min via INTRAVENOUS
  Filled 2020-10-22 (×2): qty 100

## 2020-10-22 MED ORDER — FENTANYL CITRATE (PF) 100 MCG/2ML IJ SOLN
INTRAMUSCULAR | Status: DC | PRN
  Administered 2020-10-21: 100 ug via INTRAVENOUS

## 2020-10-22 MED ORDER — CHLORHEXIDINE GLUCONATE CLOTH 2 % EX PADS
6.0000 | MEDICATED_PAD | Freq: Every day | CUTANEOUS | Status: DC
  Administered 2020-10-22 – 2020-11-24 (×29): 6 via TOPICAL

## 2020-10-22 MED ORDER — HYDROCORTISONE SOD SUC (PF) 100 MG IJ SOLR
100.0000 mg | Freq: Two times a day (BID) | INTRAMUSCULAR | Status: DC
  Administered 2020-10-22 – 2020-10-23 (×4): 100 mg via INTRAVENOUS
  Filled 2020-10-22 (×4): qty 2

## 2020-10-22 MED ORDER — ROCURONIUM BROMIDE 10 MG/ML (PF) SYRINGE
PREFILLED_SYRINGE | INTRAVENOUS | Status: DC | PRN
  Administered 2020-10-22: 60 mg via INTRAVENOUS

## 2020-10-22 MED ORDER — PROPOFOL 10 MG/ML IV BOLUS
INTRAVENOUS | Status: DC | PRN
  Administered 2020-10-21: 100 mg via INTRAVENOUS

## 2020-10-22 MED ORDER — SODIUM CHLORIDE 0.9% FLUSH
10.0000 mL | Freq: Two times a day (BID) | INTRAVENOUS | Status: DC
  Administered 2020-10-22: 20 mL
  Administered 2020-10-23 – 2020-11-13 (×32): 10 mL

## 2020-10-22 MED ORDER — VASOPRESSIN 20 UNITS/100 ML INFUSION FOR SHOCK
0.0000 [IU]/min | INTRAVENOUS | Status: DC
  Administered 2020-10-22: 0.03 [IU]/min via INTRAVENOUS
  Filled 2020-10-22: qty 100

## 2020-10-22 NOTE — Plan of Care (Signed)
  Problem: Respiratory: Goal: Ability to maintain a clear airway and adequate ventilation will improve Outcome: Progressing   

## 2020-10-22 NOTE — Progress Notes (Signed)
NAME:  Paul Chambers, MRN:  [id number], DOB:  1965/03/11, LOS: 1 ADMISSION DATE:  10/29/2020, CONSULTATION DATE:  10/31/2020 REFERRING MD:  EDP CHIEF COMPLAINT:  Abdominal Pain   History of Present Illness:  This 55 y.o. Caucasian male smoker presented to the [facility] Emergency Department via private vehicle with complaints of abdominal pain and altered mental status. At the time of clinical interview, the patient is quite somnolent, snoring loudly. He is maintaining his airway but not able to contribute meaningfully to clinical interview.  The patient's wife reports that this is the third presentation to a healthcare facility in the past month for abdominal pain ([facility], [facility] and now [facility]).  The patient's deteriorating mental status over the past 2 days prompted the wife to force the patient to come to the hospital.  In the ER tonight, CT head was negative for acute process, but CT abdomen showed an impressive pneumoperitoneum without an obvious location of perforation.  Surgery has already evaluated the patient and decided to reocmmend proceeding to surgery; the patient's wife provided consent.  Pertinent  Medical History  Alcohol Use Disorder, recent acute severe pancreatitis, HTN, Gout,   Significant Hospital Events: Including procedures, antibiotic start and stop dates in addition to other pertinent events   9/8 - Presented to [facility] with abdominal pain. CT A/P with extensive pneumoperitoneum 9/9 - Ex-lap (no colonic perforation discovered, large necrotic fluid present surrounding pancreas. Transferred to ICU post-op, intubated and sedated.   Interim History / Subjective:   Admitted overnight with ex-lap early this AM. Surgery discovered large amount of necrotic liquid in the retroperitoneum likely secondary to pancreatic necrosis in the setting of recent acute severe pancreatitis.   This AM, patient is sedated and minimally responsive.   Objective   Blood  pressure 100/66, pulse (!) 118, temperature 99.5 F (37.5 C), temperature source Axillary, resp. rate (!) 27, height 5\' 9"  (1.753 m), weight 93.7 kg, SpO2 95 %.    Vent Mode: PRVC FiO2 (%):  [50 %-100 %] 70 % Set Rate:  [16 bmp-18 bmp] 16 bmp Vt Set:  [560 mL] 560 mL PEEP:  [5 cmH20] 5 cmH20   Intake/Output Summary (Last 24 hours) at 10/22/2020 0721 Last data filed at 10/22/2020 0700 Gross per 24 hour  Intake 2144.33 ml  Output 1000 ml  Net 1144.33 ml   Filed Weights   11/12/2020 2316 10/22/20 0202  Weight: 99.8 kg 93.7 kg   Examination: General: Disphoretic, critically ill-appearing male.  HENT: Pupils are equal, round and reactive, however sluggish. No scleral icterus.  Lungs: Clear to auscultation bilaterally.  Cardiovascular: Regular rhythm with tachycardia. No murmurs Abdomen: Distended significantly with two JP drains present in the RUQ. Dark sanguinous fluid present in drain.   Extremities: 3+ pitting edema up to the knees.  Neuro: Sedated. Moving all extremities spontaneously.   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 S/p ex-lap on 9/9 due to severe pneumoperitoneum; no perforation discovered. Continues to require increasing pressor support with rising lactate.  - Continue Levophed to maintain MAP > 65.  - Start Vasopressin - LR bolus 1000 mL x 1 followed by 100 cc/hr  - Albumin 25g q6h x 2  - Will switch Vancomycin to Linezolid  - Continue Zosyn and Fluconazole  - Discontinue Metronidazole  # Acute Respiratory Failure: Secondary to toxic-metabolic encephalopathy  -  Continue full vent support  - Daily SBT - VAP prevention measures in place  # Acute toxic-metabolic encephalopathy: Secondary to sepsis. High risk for alcohol withdrawal in the setting of heavy AUD.  - Continue Fentanyl  - Discontinue Propofol  - If  patient should develop signs/symptoms concerning for alcohol withdrawal, will tentatively plan to use Ketamine   # Acute Blood Loss Anemia: Initial hgb of 11.4 with decrease to 8.6. Stable post-op but will trend # Hx of Macrocytic Anemia - Serial CBCs  - Transfuse for hgb < 7  # Hyponatremia: In the setting of critical illness. Possibly an aspect of beer potomania. Stable at this time.  - Serial sodium monitoring  # Hypoalbuminemia: Secondary to AUD and excacerbated by acute illness - Albumin 25g q6h x 2  # Type 2 Diabetes Mellitus: A1c of 6.8% on admission - SSI (Moderate)  Best Practice (right click and "Reselect all SmartList Selections" daily)   Diet/type: NPO with oral meds DVT prophylaxis: prophylactic heparin  GI prophylaxis: PPI Lines: Central line and yes and it is still needed Foley:  Yes, and it is still needed Code Status:  full code Last date of multidisciplinary goals of care discussion [9/8 on admission with wife at bedside]  Labs   CBC: Recent Labs  Lab Oct 22, 2020 1506 2020/10/22 2326 10/22/20 0045 10/22/20 0312 10/22/20 0357  WBC 29.9*  --   --   --  40.9*  HGB 11.4* 10.9* 9.9* 9.2* 8.6*  HCT 34.1* 32.0* 29.0* 27.0* 26.1*  MCV 107.9*  --   --   --  107.4*  PLT 322  --   --   --  316   Basic Metabolic Panel: Recent Labs  Lab 10/22/2020 1506 10/22/2020 2213 10-22-20 2326 10/22/20 0045 10/22/20 0312 10/22/20 0357  NA 124* 124* 124* 125* 125* 126*  K 4.7  --  4.3 4.4 4.8 5.0  CL 84*  --   --   --   --  89*  CO2 26  --   --   --   --  23  GLUCOSE 199*  --   --   --   --  205*  BUN 26*  --   --   --   --  30*  CREATININE 1.19  --   --   --   --  1.29*  CALCIUM 8.1*  --   --   --   --  8.4*  MG  --  2.3  --   --   --  2.2  PHOS  --  4.6  --   --   --  6.4*   GFR: Estimated Creatinine Clearance: 75.7 mL/min (A) (by C-G formula based on SCr of 1.29 mg/dL (H)). Recent Labs  Lab 10/22/2020 1506 2020-10-22 2213 10/22/20 0357  PROCALCITON  --  16.09 21.00   WBC 29.9*  --  40.9*  LATICACIDVEN  --  2.0*  --    Liver Function Tests: Recent Labs  Lab 10-22-2020 1506  AST 76*  ALT 53*  ALKPHOS 117  BILITOT 1.4*  PROT 6.1*  ALBUMIN 1.7*   Recent Labs  Lab 10/22/20 1506  LIPASE 25   Recent Labs  Lab 22-Oct-2020 2213  AMMONIA 37*   ABG    Component Value Date/Time   PHART 7.452 (H) 10/22/2020 0312   PCO2ART 38.4 10/22/2020 0312   PO2ART 207 (H) 10/22/2020 0312   HCO3 26.8 10/22/2020 0312   TCO2 28 10/22/2020 0312   O2SAT  100.0 10/22/2020 0312   Coagulation Profile: Recent Labs  Lab 10/20/2020 2102 10/22/20 0357  INR 2.1* 2.1*   Cardiac Enzymes: Recent Labs  Lab 10/23/2020 1550  CKTOTAL 27*   HbA1C: Hgb A1c MFr Bld  Date/Time Value Ref Range Status  10/14/2020 10:13 PM 6.8 (H) 4.8 - 5.6 % Final    Comment:    (NOTE) Pre diabetes:          5.7%-6.4%  Diabetes:              >6.4%  Glycemic control for   <7.0% adults with diabetes    CBG: Recent Labs  Lab 11/08/2020 1456 10/22/20 0149 10/22/20 0404  GLUCAP 206* 175* 215*   Review of Systems:   Negative except as noted above.   Past Medical History:  He,  has a past medical history of Alcoholism (HCC), Gout, Hypertension, Pancreatitis, and Tobacco abuse.   Surgical History:  History reviewed. No pertinent surgical history.   Social History:   reports that he has been smoking cigarettes. He does not have any smokeless tobacco history on file. He reports that he does not currently use alcohol.   Family History:  His family history includes Diverticulitis in his mother; Liver cancer in his maternal aunt.   Allergies No Known Allergies   Home Medications  Prior to Admission medications   Medication Sig Start Date End Date Taking? Authorizing Provider  acetaminophen (TYLENOL) 500 MG tablet Take 500 mg by mouth every 6 (six) hours as needed for mild pain.   Yes [provider]  albuterol (VENTOLIN HFA) 108 (90 Base) MCG/ACT inhaler Inhale 1-2 puffs  into the lungs every 6 (six) hours as needed for wheezing or shortness of breath.   Yes [provider]  allopurinol (ZYLOPRIM) 300 MG tablet Take 300 mg by mouth daily.   Yes [provider]  ALPRAZolam Prudy Feeler) 0.5 MG tablet Take 0.5 mg by mouth 3 (three) times daily as needed for anxiety.   Yes [provider]  amLODipine (NORVASC) 5 MG tablet Take 5 mg by mouth daily.   Yes [provider]  Fluticasone-Umeclidin-Vilant (TRELEGY ELLIPTA) 100-62.5-25 MCG/INH AEPB Inhale 1 puff into the lungs daily.   Yes [provider]  folic acid (FOLVITE) 1 MG tablet Take 1 mg by mouth daily.   Yes [provider]  furosemide (LASIX) 40 MG tablet Take 40 mg by mouth 2 (two) times daily.   Yes [provider]  Multiple Vitamins-Minerals (PRESERVISION AREDS 2+MULTI VIT PO) Take 2 tablets by mouth daily.   Yes [provider]  Omega-3 Krill Oil 500 MG CAPS Take 1,000 mg by mouth daily.   Yes [provider]  omeprazole (PRILOSEC) 40 MG capsule Take 40 mg by mouth daily.   Yes [provider]  oxyCODONE (OXY IR/ROXICODONE) 5 MG immediate release tablet Take 2.5-5 mg by mouth every 6 (six) hours as needed for severe pain.   Yes [provider]  thiamine (VITAMIN B-1) 100 MG tablet Take 100 mg by mouth daily.   Yes [provider]   Dr. Verdene Lennert Internal Medicine PGY-2  10/22/2020, 7:21 AM

## 2020-10-22 NOTE — Progress Notes (Addendum)
1 Day Post-Op   Subjective/Chief Complaint: Post op check Remains intubated   Objective: Vital signs in last 24 hours: Temp:  [97.9 F (36.6 C)-101 F (38.3 C)] 101 F (38.3 C) (09/09 0714) Pulse Rate:  [99-119] 119 (09/09 0737) Resp:  [16-30] 28 (09/09 0737) BP: (89-226)/(53-197) 100/66 (09/09 0737) SpO2:  [88 %-100 %] 95 % (09/09 0738) Arterial Line BP: (85-122)/(28-59) 90/52 (09/09 0714) FiO2 (%):  [50 %-100 %] 70 % (09/09 0738) Weight:  [93.7 kg-99.8 kg] 93.7 kg (09/09 0202) Last BM Date:  (pta)  Intake/Output from previous day: 09/08 0701 - 09/09 0700 In: 2144.3 [I.V.:1424.5; IV Piggyback:719.8] Out: 1000 [Urine:200; Emesis/NG output:75; Drains:525; Blood:200] Intake/Output this shift: No intake/output data recorded.  Exam: Intubated and sedated Abdomen distended, midline wound clean Drains serosang  Lab Results:  Recent Labs    11/09/2020 1506 10/30/2020 2326 10/22/20 0312 10/22/20 0357  WBC 29.9*  --   --  40.9*  HGB 11.4*   < > 9.2* 8.6*  HCT 34.1*   < > 27.0* 26.1*  PLT 322  --   --  316   < > = values in this interval not displayed.   BMET Recent Labs    10/20/2020 1506 10/28/2020 2213 10/22/20 0312 10/22/20 0357  NA 124*   < > 125* 126*  K 4.7   < > 4.8 5.0  CL 84*  --   --  89*  CO2 26  --   --  23  GLUCOSE 199*  --   --  205*  BUN 26*  --   --  30*  CREATININE 1.19  --   --  1.29*  CALCIUM 8.1*  --   --  8.4*   < > = values in this interval not displayed.   PT/INR Recent Labs    11/05/2020 2102 10/22/20 0357  LABPROT 23.9* 23.7*  INR 2.1* 2.1*   ABG Recent Labs    10/22/20 0045 10/22/20 0312  PHART 7.406 7.452*  HCO3 27.9 26.8    Studies/Results: CT Head Wo Contrast  Result Date: 11/02/2020 CLINICAL DATA:  Altered mental status. EXAM: CT HEAD WITHOUT CONTRAST TECHNIQUE: Contiguous axial images were obtained from the base of the skull through the vertex without intravenous contrast. COMPARISON:  None. FINDINGS: Brain: No evidence of  acute infarction, hemorrhage, hydrocephalus, extra-axial collection or mass lesion/mass effect. Vascular: No hyperdense vessel or unexpected calcification. Skull: Normal. Negative for fracture or focal lesion. Sinuses/Orbits: No acute finding. Other: None. IMPRESSION: No acute intracranial abnormality seen. Electronically Signed   By: Lupita Raider M.D.   On: 10/25/2020 18:56   CT ABDOMEN PELVIS W CONTRAST  Result Date: 10/14/2020 CLINICAL DATA:  Abdominal abscess/infection suspected EXAM: CT ABDOMEN AND PELVIS WITH CONTRAST TECHNIQUE: Multidetector CT imaging of the abdomen and pelvis was performed using the standard protocol following bolus administration of intravenous contrast. CONTRAST:  OMNIPAQUE IOHEXOL 350 MG/ML SOLN COMPARISON:  None. FINDINGS: Lower chest: Small left pleural effusion. Bilateral lower lobe airspace opacities, left greater than right. Hepatobiliary: No focal hepatic abnormality. Gallbladder unremarkable. Pancreas: Pancreas is poorly visualized due to extensive fluid and gas dissecting throughout the retroperitoneum. Fluid in the region of the pancreatic body and tail. Spleen: No focal abnormality.  Normal size. Adrenals/Urinary Tract: No adrenal abnormality. No focal renal abnormality. No stones or hydronephrosis. Urinary bladder is unremarkable. Stomach/Bowel: There is extensive gas and fluid dissecting throughout the retroperitoneum. This continues into the peritoneum with pneumoperitoneum and ascites. This most likely reflects  perforated hollow viscus. Given the degree of involvement of the retroperitoneum, I would suspect a retroperitoneal perforation, possibly perforated duodenal ulcer. Other possible source would be the descending duodenum although no real concerning appearance of the descending duodenum. Less likely but possible would be perforated peritoneal bowel. Vascular/Lymphatic: No evidence of aneurysm or adenopathy. Reproductive: No visible focal abnormality. Other:  Extensive retroperitoneal fluid and gas as well as pneumoperitoneum and ascites as described above. Musculoskeletal: No acute bony abnormality. IMPRESSION: Extensive gas and fluid dissecting throughout the retroperitoneum and likely extending into the peritoneum where there is pneumoperitoneum and ascites. Appearance is most compatible with perforated bowel, likely in the retroperitoneum with duodenal the most likely source although exact source is not readily apparent. Pancreas poorly visualized with fluid and gas in the region of the body and tail. While necrotizing pancreatitis could have this appearance, this is felt less likely given the extent of retroperitoneal gas and fluid and a normal lipase. Critical Value/emergent results were called by telephone at the time of interpretation on 10/25/2020 at 8:42 pm to provider Gerhard Munch , who verbally acknowledged these results. Electronically Signed   By: Charlett Nose M.D.   On: 11/11/2020 20:49   DG CHEST PORT 1 VIEW  Result Date: 10/22/2020 CLINICAL DATA:  Central line placement EXAM: PORTABLE CHEST 1 VIEW COMPARISON:  10/27/2020 FINDINGS: Endotracheal tube is 4.5 cm above the carina. Right central line tip in the SVC. No pneumothorax. NG tube is in the stomach. Small left pleural effusion. Bilateral lower lobe airspace opacities could reflect atelectasis or infiltrates. IMPRESSION: Support devices in expected position.  No pneumothorax. Bilateral lower lobe atelectasis or infiltrates. Small left effusion. Electronically Signed   By: Charlett Nose M.D.   On: 10/22/2020 02:23   DG Chest Port 1 View  Result Date: 11/12/2020 CLINICAL DATA:  sob EXAM: PORTABLE CHEST 1 VIEW COMPARISON:  None. FINDINGS: The cardiomediastinal silhouette is within normal limits. No pleural effusion. No pneumothorax. Left lung base consolidation. No acute osseous abnormality. IMPRESSION: Left lung base consolidation suggestive of pneumonia in the appropriate clinical context.  Electronically Signed   By: Olive Bass M.D.   On: 10/18/2020 16:33    Anti-infectives: Anti-infectives (From admission, onward)    Start     Dose/Rate Route Frequency Ordered Stop   10/22/20 1800  vancomycin (VANCOREADY) IVPB 1500 mg/300 mL        1,500 mg 150 mL/hr over 120 Minutes Intravenous Every 24 hours 11/05/2020 2225     10/22/20 0300  piperacillin-tazobactam (ZOSYN) IVPB 3.375 g        3.375 g 12.5 mL/hr over 240 Minutes Intravenous Every 8 hours 11/11/2020 2225     11/06/2020 2335  metroNIDAZOLE (FLAGYL) IVPB 500 mg  Status:  Discontinued        500 mg 100 mL/hr over 60 Minutes Intravenous Every 12 hours 10/27/2020 2142 10/22/20 0747   11/03/2020 2248  fluconazole (DIFLUCAN) IVPB 400 mg        400 mg 100 mL/hr over 120 Minutes Intravenous Every 24 hours 11/05/2020 2142     11/04/2020 1748  vancomycin (VANCOREADY) IVPB 2000 mg/400 mL        2,000 mg 200 mL/hr over 120 Minutes Intravenous  Once 10/26/2020 1706 11/04/2020 2113   11/03/2020 1715  piperacillin-tazobactam (ZOSYN) IVPB 3.375 g        3.375 g 100 mL/hr over 30 Minutes Intravenous  Once 10/27/2020 1706 10/25/2020 1903       Assessment/Plan: s/p Procedure(s): LAPAROSCOPY DIAGNOSTIC (  N/A) EXPLORATORY LAPAROTOMY AND PANCREATIC DEBRIDEMENT (N/A)  Remains in critical condition post op as expected  Continue aggressive resuscitation per CCM IV antibiotics May require re-exploration in next 24 to 48 hours Transfuse as needed Wound care   Abigail Miyamoto MD 10/22/2020  Further questions today should go to our acute care team/Dr. Bedelia Person

## 2020-10-22 NOTE — Transfer of Care (Signed)
Immediate Anesthesia Transfer of Care Note  Patient: Paul Chambers  Procedure(s) Performed: LAPAROSCOPY DIAGNOSTIC (Abdomen) EXPLORATORY LAPAROTOMY AND PANCREATIC DEBRIDEMENT  Patient Location: ICU  Anesthesia Type:General  Level of Consciousness: sedated, unresponsive and Patient remains intubated per anesthesia plan  Airway & Oxygen Therapy: Patient remains intubated per anesthesia plan and Patient placed on Ventilator (see vital sign flow sheet for setting)  Post-op Assessment: Report given to RN and Post -op Vital signs reviewed and stable. +BBS, placed on vent by RT  Post vital signs: Reviewed and stable  Last Vitals:  Vitals Value Taken Time  BP 93/57 10/22/20 0200  Temp    Pulse 99 10/22/20 0201  Resp 18 10/22/20 0201  SpO2 100 % 10/22/20 0201  Vitals shown include unvalidated device data.  Last Pain:  Vitals:   2020/11/14 2130  TempSrc:   PainSc: 3          Complications: No notable events documented.

## 2020-10-22 NOTE — TOC Initial Note (Signed)
Transition of Care [facility]) - Initial/Assessment Note    Patient Details  Name: Paul Chambers MRN: [id number] Date of Birth: 1965-02-15  Transition of [facility]) CM/SW Contact:    Tom-Johnson, Hershal Coria, RN Phone Number: 10/22/2020, 5:25 PM  Clinical Narrative:                 CM spoke with wife at bedside as patient is intubated at this time. Wife states they live together with their son prior hospitalization. Patient quit drinking a month ago but was still smoking. Has a rolator at home. Has a PCP and drives self to and from appointments. Able to pay co pay for meds. Denies any needs at this time as patient is intubated. CM told her to call if any needs arise. Will continue to follow with TOC needs.    Barriers to Discharge: Continued Medical Work up   Patient Goals and CMS Choice Patient states their goals for this hospitalization and ongoing recovery are:: To go home      Expected Discharge Plan and Services     Discharge Planning Services: CM Consult   Living arrangements for the past 2 months: Single Family Home                                      Prior Living Arrangements/Services Living arrangements for the past 2 months: Single Family Home Lives with:: Spouse Patient language and need for interpreter reviewed:: Yes Do you feel safe going back to the place where you live?: Yes      Need for Family Participation in Patient Care: Yes (Comment)   Current home services: DME (Rollator) Criminal Activity/Legal Involvement Pertinent to Current Situation/Hospitalization: No - Comment as needed  Activities of Daily Living      Permission Sought/Granted Permission sought to share information with : Case Manager Permission granted to share information with : Yes, Verbal Permission Granted              Emotional Assessment Appearance:: Appears stated age Attitude/Demeanor/Rapport: Engaged Affect (typically observed): Unable to Assess  (Intubated) Orientation: :  (Intubated) Alcohol / Substance Use: Alcohol Use, Tobacco Use (Quit a moth ago. 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 - [address] [place] [address] Phone: [phone] Fax: [phone]     Social Determinants of Health (SDOH) Interventions    Readmission Risk Interventions No flowsheet data found.

## 2020-10-22 NOTE — Progress Notes (Signed)
Pt transported from OR room # 8 to 3M08 with no complications.

## 2020-10-22 NOTE — Progress Notes (Signed)
eLink Physician-Brief Progress Note Patient Name: Paul Chambers DOB: 1965/12/31 MRN: [id number]   Date of Service  10/22/2020  HPI/Events of Note  Patient with severe pancreatitis with pancreatic necrosis s/p exploratory laparotomy with pancreatic debridement, transported to the ICU post-op, intubated and mechanically ventilated.  eICU Interventions  New Patient Evaluation.        Welden Hausmann U Stephanieann Popescu 10/22/2020, 3:06 AM

## 2020-10-22 NOTE — Anesthesia Procedure Notes (Addendum)
Central Venous Catheter Insertion Performed by: Val Eagle, MD, anesthesiologist Start/End9/10/2020 1:14 AM, 10/22/2020 1:30 AM Patient location: OR. Preanesthetic checklist: patient identified, IV checked, site marked, risks and benefits discussed, surgical consent, monitors and equipment checked, pre-op evaluation, timeout performed and anesthesia consent Position: supine Hand hygiene performed  and maximum sterile barriers used  Catheter size: 8 Fr Total catheter length 16. Central line was placed.Double lumen Procedure performed using ultrasound guided technique. Ultrasound Notes:image(s) printed for medical record Attempts: 1 Following insertion, dressing applied, line sutured and Biopatch. Post procedure assessment: blood return through all ports and free fluid flow  Patient tolerated the procedure well with no immediate complications.

## 2020-10-22 NOTE — Progress Notes (Signed)
Initial Nutrition Assessment  DOCUMENTATION CODES:   Severe malnutrition in context of acute illness/injury, Not applicable  INTERVENTION:   - Recommend initiation of TPN given severe malnutrition, minimal PO intake x 1 week PTA, decreased PO intake x 1.5 months, significant weight loss  NUTRITION DIAGNOSIS:   Severe Malnutrition related to acute illness (infected necrotic pancreatitis) as evidenced by moderate fat depletion, moderate muscle depletion, energy intake < or equal to 50% for > or equal to 5 days, percent weight loss (8.4% weight loss in 1.5 months).  GOAL:   Patient will meet greater than or equal to 90% of their needs  MONITOR:   Vent status, Labs, Weight trends, Skin, I & O's  REASON FOR ASSESSMENT:   Ventilator    ASSESSMENT:   55 year old male who presented to the ED on 9/08 with AMS and abdominal pain. Pt seen at [facility] 1 week PTA and was diagnosed with AKI and pancreatitis. PMH of HTN, gout, tobacco abuse, EtOH abuse. Pt admitted with sepsis secondary to infected necrotic pancreatitis.  9/09 - s/p ex-lap, pancreatic debridement  Discussed pt with RN and during ICU rounds. Pt remains intubated post-op and may need to return to the OR in the next 24-48 hours. Pt with NGT in L nare to low intermittent suction. Per RN, pressor requirements improving and pt responding to voice.  Spoke with pt's wife at bedside. Pt's wife confirms that pt has had very minimal PO intake since 9/02. Since that date, pt's wife has only witnessed pt consuming 2 chicken nuggets, half of a hotdog, Core water, Sunkist, and Gatorade. Pt's wife has been providing pt with whatever food he requests, but pt is not eating any of it. Pt's wife states that the week before this, pt was eating a little more and consuming items like peanut butter crackers. Overall, pt has had inadequate oral intake over the last 1-2 months after initial hospitalization 1.5 months ago. Pt's wife reports that pt has  declined during this timeframe. She has noticed his face appearing more "sunken in."  Pt's wife reports that pt's UBW is 225 lbs. Pt's current weight is 206.57 lbs even with +2 pitting edema to BUE and +4 pitting edema to BLE. Unsure of pt's true dry weight at this time but suspect it is less than 200 lbs.  Pt's wife states that pt has lost from UBW down to current weight in the last 1.5 months. This is an 8.6 kg weight loss (8.4% weight loss) which is severe and significant for timeframe. Pt meets criteria for severe acute malnutrition. Suspect weight loss is even more significant given pt is currently volume overloaded.  Recommend initiation of TPN today due to severe malnutrition, minimal PO intake x 1 week PTA. Surgery in agreement; however, CCM would like to hold off today and reassess tomorrow for TPN initiation.  Patient is currently intubated on ventilator support MV: 9.7 L/min Temp (24hrs), Avg:99.1 F (37.3 C), Min:97.9 F (36.6 C), Max:101 F (38.3 C) BP (a-line): 105/61 MAP (a-line): 74  Drips: Fentanyl Levophed Vasopressin  Medications reviewed and include: colace, IV folic acid, IV solu-cortef, SSI q 4 hours, IV protonix, miralax, IV thiamine, IV albumin, IV diflucan, IV abx  Labs reviewed: sodium 125, BUN 33, creatinine 1.86, ionized calcium 1.09, phosphorus 6.4, lactic acid 4.9, WBC 53.9, hemoglobin 9.8 CBG's: 173-215 x 24 hours  UOP: 200 ml x 12 hours NGT: 75 ml x 12 hours JP drain 1: 325 ml x 12 hours JP drain 2:  200 ml x 12 hours I/O's: +1.8 L since admit  NUTRITION - FOCUSED PHYSICAL EXAM:  Flowsheet Row Most Recent Value  Orbital Region Moderate depletion  Upper Arm Region Mild depletion  Thoracic and Lumbar Region Moderate depletion  Buccal Region Unable to assess  Temple Region Moderate depletion  Clavicle Bone Region Severe depletion  Clavicle and Acromion Bone Region Moderate depletion  Scapular Bone Region Unable to assess  Dorsal Hand No  depletion  Patellar Region Mild depletion  Anterior Thigh Region Moderate depletion  Posterior Calf Region Moderate depletion  Edema (RD Assessment) Moderate  [BUE, BLE]  Hair Reviewed  Eyes Unable to assess  Mouth Unable to assess  Skin Reviewed  Nails Reviewed       Diet Order:   Diet Order             Diet NPO time specified  Diet effective now                   EDUCATION NEEDS:   No education needs have been identified at this time  Skin:  Skin Assessment: Skin Integrity Issues: Stage I: coccyx Incisions: abdomen  Last BM:  no documented BM  Height:   Ht Readings from Last 1 Encounters:  25-Oct-2020 5\' 9"  (1.753 m)    Weight:   Wt Readings from Last 1 Encounters:  10/22/20 93.7 kg    BMI:  Body mass index is 30.51 kg/m.  Estimated Nutritional Needs:   Kcal:  2200-2400  Protein:  125-145 grams  Fluid:  >/= 2.0 L    12/22/20, MS, RD, LDN Inpatient Clinical Dietitian Please see AMiON for contact information.

## 2020-10-22 NOTE — Anesthesia Procedure Notes (Signed)
Arterial Line Insertion Start/End9/10/2020 12:30 AM, 10/22/2020 12:35 AM Performed by: Edmonia Caprio, CRNA, CRNA  Patient location: OR. Emergency situation Patient sedated Right, radial was placed Catheter size: 20 G Hand hygiene performed   Attempts: 1 Procedure performed without using ultrasound guided technique. Following insertion, dressing applied and Biopatch. Post procedure assessment: normal  Patient tolerated the procedure well with no immediate complications.

## 2020-10-22 NOTE — Op Note (Signed)
Preoperative diagnosis: free air  Postoperative diagnosis: infected necrotic pancreatitis  Procedure: pancreatic debridement, exploratory laparotomy  Surgeon: Feliciana Rossetti, M.D.  Asst: none  Anesthesia: general  Indications for procedure: Paul Chambers is a 55 y.o. year old male with symptoms of abdominal pain and sepsis. His work up showed large amount of retroperitoneal and intraperitoneal air. Due to concern for hollow viscus perforation he was taken urgently to the operating room for exploration.  Description of procedure: The patient was brought into the operative suite. Anesthesia was administered with General endotracheal anesthesia. WHO checklist was applied. The patient was then placed in supine position. The area was prepped and draped in the usual sterile fashion.  Next, a small left subcostal incision was made. A 38mm trocar was used to gain access to the peritoneal cavity by optical entry technique. Pneumoperitoneum was applied with a high flow and low pressure. The laparoscope was reinserted to confirm position.  Upon evaluation of the abdomen, the omentum was adhered to the anterior abdominal wall. There was complex green fluid in the left upper quadrant. Therefore, upper midline incision was made. The fascia was divided in the midline with cautery. Upon entering the peritoneal space the omentum was bluntly dissected free of the abdominal wall. The omentum had increased oozing from minor dissection due to coagulopathy.   The stomach and duodenum were closely inspected and no perforation was identified there was no succus in the space above the duodenum.  In terms attention towards the intestine the omentum was raised and at the base of the transverse mesocolon, there was a large amount of necrotic tissue draining.  This was removed with suction.  The area of necrotic drainage tract along the duodenum and the posterior space as well as up to the left upper quadrant.  Next I entered  the lesser sac using Enseal device to divide the lesser omentum.  There was no large opening the retroperitoneum and the space was adhered to the stomach and edematous.  Examined the pelvis and removed 1 to 2 L of simple ascites from the space.  The NG tube was confirmed to be in the mid body of the stomach.  No further perforations were identified in the remainder of the intestines.  2 large Blake drains were placed a 32 [place] into the area under the mesocolon along with the head of the pancreas and 128 [place] under the mesocolon along the left upper abdomen and what is likely a position inferior to the pancreas.  Once these drains were placed a large additional amount of necrotic tissue was expressed and removed.  Drains were brought through the right mid abdomen and sutured in place with 2-0 nylon.  Fascia was then closed with 0 PDS in running fashion.  Wound was left open due to contaminant nature of the case and saline moistened Kerlix was placed for dressing.  The laparoscopic site was closed with staples.  All counts were correct.  Patient was brought to the ICU in stable but critical condition and left on the ventilator.  Findings: Liquefactive necrosis of the retroperitoneum, NG tube in the mid body, 1 to 2 L of simple ascites in the pelvis.  Specimen: None  Implant: 30 French Blake drain from the right mid abdomen tracking through the mesocolon along the head of the pancreas, 28 [place] Blake drain from the right mid abdomen through the mesocolon towards the left upper quadrant  Blood loss: 200 ml  Local anesthesia: none  Complications: none  [employer],  M.D. General, Bariatric, & Minimally Invasive Surgery [facility], [place]

## 2020-10-22 NOTE — Anesthesia Procedure Notes (Signed)
Procedure Name: Intubation Date/Time: 10/22/2020 12:00 AM Performed by: Edmonia Caprio, CRNA Pre-anesthesia Checklist: Patient identified, Emergency Drugs available, Suction available and Patient being monitored Patient Re-evaluated:Patient Re-evaluated prior to induction Oxygen Delivery Method: Circle system utilized Preoxygenation: Pre-oxygenation with 100% oxygen Induction Type: IV induction and Rapid sequence Laryngoscope Size: Glidescope and 4 Grade View: Grade I Tube type: Oral Tube size: 7.5 mm Number of attempts: 1 Airway Equipment and Method: Stylet, Oral airway and Video-laryngoscopy Placement Confirmation: ETT inserted through vocal cords under direct vision, positive ETCO2 and breath sounds checked- equal and bilateral Secured at: 24 cm Tube secured with: Tape Dental Injury: Teeth and Oropharynx as per pre-operative assessment

## 2020-10-23 DIAGNOSIS — K8591 Acute pancreatitis with uninfected necrosis, unspecified: Secondary | ICD-10-CM | POA: Diagnosis not present

## 2020-10-23 DIAGNOSIS — E8809 Other disorders of plasma-protein metabolism, not elsewhere classified: Secondary | ICD-10-CM | POA: Diagnosis not present

## 2020-10-23 DIAGNOSIS — A419 Sepsis, unspecified organism: Secondary | ICD-10-CM | POA: Diagnosis not present

## 2020-10-23 DIAGNOSIS — E43 Unspecified severe protein-calorie malnutrition: Secondary | ICD-10-CM | POA: Diagnosis present

## 2020-10-23 DIAGNOSIS — E871 Hypo-osmolality and hyponatremia: Secondary | ICD-10-CM | POA: Diagnosis not present

## 2020-10-23 LAB — PREPARE FRESH FROZEN PLASMA
Unit division: 0
Unit division: 0

## 2020-10-23 LAB — BLOOD GAS, ARTERIAL
Acid-Base Excess: 0.8 mmol/L (ref 0.0–2.0)
Bicarbonate: 25.5 mmol/L (ref 20.0–28.0)
Drawn by: 39898
FIO2: 50
O2 Saturation: 96.6 %
Patient temperature: 37
pCO2 arterial: 45.1 mmHg (ref 32.0–48.0)
pH, Arterial: 7.371 (ref 7.350–7.450)
pO2, Arterial: 92 mmHg (ref 83.0–108.0)

## 2020-10-23 LAB — BPAM FFP
Blood Product Expiration Date: 202209132359
Blood Product Expiration Date: 202209132359
ISSUE DATE / TIME: 202209090655
ISSUE DATE / TIME: 202209091021
Unit Type and Rh: 600
Unit Type and Rh: 6200

## 2020-10-23 LAB — CBC
HCT: 20.5 % — ABNORMAL LOW (ref 39.0–52.0)
HCT: 22.3 % — ABNORMAL LOW (ref 39.0–52.0)
Hemoglobin: 6.7 g/dL — CL (ref 13.0–17.0)
Hemoglobin: 7.5 g/dL — ABNORMAL LOW (ref 13.0–17.0)
MCH: 35.6 pg — ABNORMAL HIGH (ref 26.0–34.0)
MCH: 35.9 pg — ABNORMAL HIGH (ref 26.0–34.0)
MCHC: 32.7 g/dL (ref 30.0–36.0)
MCHC: 33.6 g/dL (ref 30.0–36.0)
MCV: 109 fL — ABNORMAL HIGH (ref 80.0–100.0)
MCV: 106.7 fL — ABNORMAL HIGH (ref 80.0–100.0)
Platelets: 224 10*3/uL (ref 150–400)
Platelets: 245 10*3/uL (ref 150–400)
RBC: 1.88 MIL/uL — ABNORMAL LOW (ref 4.22–5.81)
RBC: 2.09 MIL/uL — ABNORMAL LOW (ref 4.22–5.81)
RDW: 14.9 % (ref 11.5–15.5)
RDW: 16.8 % — ABNORMAL HIGH (ref 11.5–15.5)
WBC: 33.5 10*3/uL — ABNORMAL HIGH (ref 4.0–10.5)
WBC: 35.7 10*3/uL — ABNORMAL HIGH (ref 4.0–10.5)
nRBC: 0.1 % (ref 0.0–0.2)
nRBC: 0.1 % (ref 0.0–0.2)

## 2020-10-23 LAB — COMPREHENSIVE METABOLIC PANEL
ALT: 34 U/L (ref 0–44)
AST: 57 U/L — ABNORMAL HIGH (ref 15–41)
Albumin: 2.4 g/dL — ABNORMAL LOW (ref 3.5–5.0)
Alkaline Phosphatase: 85 U/L (ref 38–126)
Anion gap: 10 (ref 5–15)
BUN: 40 mg/dL — ABNORMAL HIGH (ref 6–20)
CO2: 25 mmol/L (ref 22–32)
Calcium: 7.7 mg/dL — ABNORMAL LOW (ref 8.9–10.3)
Chloride: 92 mmol/L — ABNORMAL LOW (ref 98–111)
Creatinine, Ser: 2.07 mg/dL — ABNORMAL HIGH (ref 0.61–1.24)
GFR, Estimated: 38 mL/min — ABNORMAL LOW (ref >60)
Glucose, Bld: 206 mg/dL — ABNORMAL HIGH (ref 70–99)
Potassium: 5 mmol/L (ref 3.5–5.1)
Sodium: 127 mmol/L — ABNORMAL LOW (ref 135–145)
Total Bilirubin: 1.3 mg/dL — ABNORMAL HIGH (ref 0.3–1.2)
Total Protein: 4.9 g/dL — ABNORMAL LOW (ref 6.5–8.1)

## 2020-10-23 LAB — MAGNESIUM: Magnesium: 2.4 mg/dL (ref 1.7–2.4)

## 2020-10-23 LAB — GLUCOSE, CAPILLARY
Glucose-Capillary: 148 mg/dL — ABNORMAL HIGH (ref 70–99)
Glucose-Capillary: 158 mg/dL — ABNORMAL HIGH (ref 70–99)
Glucose-Capillary: 220 mg/dL — ABNORMAL HIGH (ref 70–99)
Glucose-Capillary: 236 mg/dL — ABNORMAL HIGH (ref 70–99)
Glucose-Capillary: 264 mg/dL — ABNORMAL HIGH (ref 70–99)
Glucose-Capillary: 309 mg/dL — ABNORMAL HIGH (ref 70–99)

## 2020-10-23 LAB — PREPARE RBC (CROSSMATCH)

## 2020-10-23 LAB — LACTIC ACID, PLASMA: Lactic Acid, Venous: 1.8 mmol/L (ref 0.5–1.9)

## 2020-10-23 LAB — PHOSPHORUS: Phosphorus: 7.7 mg/dL — ABNORMAL HIGH (ref 2.5–4.6)

## 2020-10-23 MED ORDER — LACTATED RINGERS IV BOLUS
1000.0000 mL | Freq: Once | INTRAVENOUS | Status: AC
  Administered 2020-10-23: 1000 mL via INTRAVENOUS

## 2020-10-23 MED ORDER — TRACE MINERALS CU-MN-SE-ZN 300-55-60-3000 MCG/ML IV SOLN
INTRAVENOUS | Status: AC
  Filled 2020-10-23: qty 392

## 2020-10-23 MED ORDER — LACTATED RINGERS IV SOLN: INTRAVENOUS | Status: DC

## 2020-10-23 MED ORDER — INSULIN ASPART 100 UNIT/ML IJ SOLN
0.0000 [IU] | INTRAMUSCULAR | Status: DC
  Administered 2020-10-23: 15 [IU] via SUBCUTANEOUS
  Administered 2020-10-23: 11 [IU] via SUBCUTANEOUS
  Administered 2020-10-23: 4 [IU] via SUBCUTANEOUS
  Administered 2020-10-23: 7 [IU] via SUBCUTANEOUS
  Administered 2020-10-24 (×2): 11 [IU] via SUBCUTANEOUS
  Administered 2020-10-24: 7 [IU] via SUBCUTANEOUS
  Administered 2020-10-24: 11 [IU] via SUBCUTANEOUS
  Administered 2020-10-24 (×2): 7 [IU] via SUBCUTANEOUS
  Administered 2020-10-25 (×2): 4 [IU] via SUBCUTANEOUS
  Administered 2020-10-25: 11 [IU] via SUBCUTANEOUS
  Administered 2020-10-26: 4 [IU] via SUBCUTANEOUS
  Administered 2020-10-26: 7 [IU] via SUBCUTANEOUS
  Administered 2020-10-26: 4 [IU] via SUBCUTANEOUS
  Administered 2020-10-26: 11 [IU] via SUBCUTANEOUS
  Administered 2020-10-26: 7 [IU] via SUBCUTANEOUS
  Administered 2020-10-26: 11 [IU] via SUBCUTANEOUS
  Administered 2020-10-27: 4 [IU] via SUBCUTANEOUS
  Administered 2020-10-27: 7 [IU] via SUBCUTANEOUS
  Administered 2020-10-27 (×3): 4 [IU] via SUBCUTANEOUS
  Administered 2020-10-27: 7 [IU] via SUBCUTANEOUS
  Administered 2020-10-28: 3 [IU] via SUBCUTANEOUS
  Administered 2020-10-28: 4 [IU] via SUBCUTANEOUS
  Administered 2020-10-28: 7 [IU] via SUBCUTANEOUS
  Administered 2020-10-28 (×2): 4 [IU] via SUBCUTANEOUS
  Administered 2020-10-28: 7 [IU] via SUBCUTANEOUS
  Administered 2020-10-29 – 2020-10-30 (×12): 4 [IU] via SUBCUTANEOUS
  Administered 2020-10-31 (×5): 3 [IU] via SUBCUTANEOUS
  Administered 2020-10-31: 4 [IU] via SUBCUTANEOUS
  Administered 2020-11-01: 3 [IU] via SUBCUTANEOUS
  Administered 2020-11-01 (×5): 4 [IU] via SUBCUTANEOUS
  Administered 2020-11-02: 3 [IU] via SUBCUTANEOUS
  Administered 2020-11-02: 7 [IU] via SUBCUTANEOUS
  Administered 2020-11-02: 4 [IU] via SUBCUTANEOUS
  Administered 2020-11-02 (×2): 7 [IU] via SUBCUTANEOUS
  Administered 2020-11-02: 4 [IU] via SUBCUTANEOUS
  Administered 2020-11-03: 7 [IU] via SUBCUTANEOUS
  Administered 2020-11-03 (×2): 4 [IU] via SUBCUTANEOUS
  Administered 2020-11-03 (×2): 3 [IU] via SUBCUTANEOUS
  Administered 2020-11-04: 4 [IU] via SUBCUTANEOUS
  Administered 2020-11-04 – 2020-11-05 (×5): 7 [IU] via SUBCUTANEOUS
  Administered 2020-11-05: 4 [IU] via SUBCUTANEOUS
  Administered 2020-11-05: 7 [IU] via SUBCUTANEOUS
  Administered 2020-11-05 (×3): 4 [IU] via SUBCUTANEOUS
  Administered 2020-11-06: 11 [IU] via SUBCUTANEOUS
  Administered 2020-11-06: 7 [IU] via SUBCUTANEOUS
  Administered 2020-11-06: 3 [IU] via SUBCUTANEOUS
  Administered 2020-11-06: 4 [IU] via SUBCUTANEOUS
  Administered 2020-11-06: 11 [IU] via SUBCUTANEOUS
  Administered 2020-11-06: 7 [IU] via SUBCUTANEOUS
  Administered 2020-11-07 (×3): 4 [IU] via SUBCUTANEOUS
  Administered 2020-11-07: 3 [IU] via SUBCUTANEOUS
  Administered 2020-11-08 (×4): 4 [IU] via SUBCUTANEOUS
  Administered 2020-11-08: 7 [IU] via SUBCUTANEOUS
  Administered 2020-11-08: 3 [IU] via SUBCUTANEOUS
  Administered 2020-11-09 (×5): 4 [IU] via SUBCUTANEOUS
  Administered 2020-11-10: 3 [IU] via SUBCUTANEOUS
  Administered 2020-11-10: 4 [IU] via SUBCUTANEOUS
  Administered 2020-11-10 (×4): 3 [IU] via SUBCUTANEOUS
  Administered 2020-11-11: 11 [IU] via SUBCUTANEOUS
  Administered 2020-11-11 (×2): 7 [IU] via SUBCUTANEOUS
  Administered 2020-11-11: 4 [IU] via SUBCUTANEOUS
  Administered 2020-11-11: 7 [IU] via SUBCUTANEOUS
  Administered 2020-11-11: 4 [IU] via SUBCUTANEOUS
  Administered 2020-11-12 (×2): 7 [IU] via SUBCUTANEOUS
  Administered 2020-11-12: 3 [IU] via SUBCUTANEOUS
  Administered 2020-11-12: 11 [IU] via SUBCUTANEOUS
  Administered 2020-11-13 (×3): 4 [IU] via SUBCUTANEOUS
  Administered 2020-11-13 (×3): 7 [IU] via SUBCUTANEOUS
  Administered 2020-11-14 (×3): 3 [IU] via SUBCUTANEOUS
  Administered 2020-11-14: 4 [IU] via SUBCUTANEOUS
  Administered 2020-11-14: 3 [IU] via SUBCUTANEOUS
  Administered 2020-11-15 (×2): 4 [IU] via SUBCUTANEOUS
  Administered 2020-11-15 – 2020-11-16 (×3): 3 [IU] via SUBCUTANEOUS
  Administered 2020-11-16 (×2): 4 [IU] via SUBCUTANEOUS
  Administered 2020-11-16: 7 [IU] via SUBCUTANEOUS
  Administered 2020-11-17 (×3): 3 [IU] via SUBCUTANEOUS
  Administered 2020-11-17 – 2020-11-18 (×2): 4 [IU] via SUBCUTANEOUS
  Administered 2020-11-18: 3 [IU] via SUBCUTANEOUS
  Administered 2020-11-18 (×2): 4 [IU] via SUBCUTANEOUS
  Administered 2020-11-18: 3 [IU] via SUBCUTANEOUS
  Administered 2020-11-19: 4 [IU] via SUBCUTANEOUS
  Administered 2020-11-19: 7 [IU] via SUBCUTANEOUS
  Administered 2020-11-19 (×3): 4 [IU] via SUBCUTANEOUS
  Administered 2020-11-20 (×2): 3 [IU] via SUBCUTANEOUS
  Administered 2020-11-20 (×3): 7 [IU] via SUBCUTANEOUS
  Administered 2020-11-20: 4 [IU] via SUBCUTANEOUS
  Administered 2020-11-20: 7 [IU] via SUBCUTANEOUS
  Administered 2020-11-21 – 2020-11-22 (×5): 4 [IU] via SUBCUTANEOUS
  Administered 2020-11-22 (×2): 3 [IU] via SUBCUTANEOUS
  Administered 2020-11-22: 4 [IU] via SUBCUTANEOUS
  Administered 2020-11-22 (×2): 3 [IU] via SUBCUTANEOUS
  Administered 2020-11-22 – 2020-11-23 (×2): 4 [IU] via SUBCUTANEOUS
  Administered 2020-11-23: 3 [IU] via SUBCUTANEOUS
  Administered 2020-11-23 – 2020-11-24 (×7): 4 [IU] via SUBCUTANEOUS
  Administered 2020-11-24 – 2020-11-25 (×4): 7 [IU] via SUBCUTANEOUS
  Administered 2020-11-25 – 2020-11-26 (×8): 4 [IU] via SUBCUTANEOUS
  Administered 2020-11-26 (×2): 7 [IU] via SUBCUTANEOUS
  Administered 2020-11-26: 4 [IU] via SUBCUTANEOUS
  Administered 2020-11-27: 7 [IU] via SUBCUTANEOUS
  Administered 2020-11-27 (×2): 4 [IU] via SUBCUTANEOUS

## 2020-10-23 MED ORDER — SODIUM CHLORIDE 0.9% IV SOLUTION: Freq: Once | INTRAVENOUS | Status: AC

## 2020-10-23 NOTE — Progress Notes (Signed)
Date and time results received: 10/23/20 05:00 (use smartphrase ".now" to insert current time)  Test: Hgb Critical Value: 6.7  Name of Provider Notified: CCMD  Orders Received? Or Actions Taken?:  Transfuse 1 unit PRBCs.

## 2020-10-23 NOTE — Progress Notes (Addendum)
NAME:  Paul Chambers, MRN:  [id number], DOB:  1966-01-17, LOS: 2 ADMISSION DATE:  11/06/2020, CONSULTATION DATE:  10/28/2020 REFERRING MD:  EDP CHIEF COMPLAINT:  Abdominal Pain   History of Present Illness:  This 55 y.o. Caucasian male smoker presented to the [facility] Emergency Department via private vehicle with complaints of abdominal pain and altered mental status. At the time of clinical interview, the patient is quite somnolent, snoring loudly. He is maintaining his airway but not able to contribute meaningfully to clinical interview.  The patient's wife reports that this is the third presentation to a healthcare facility in the past month for abdominal pain ([facility], [facility] and now [facility]).  The patient's deteriorating mental status over the past 2 days prompted the wife to force the patient to come to the hospital.  In the ER tonight, CT head was negative for acute process, but CT abdomen showed an impressive pneumoperitoneum without an obvious location of perforation.  Surgery has already evaluated the patient and decided to reocmmend proceeding to surgery; the patient's wife provided consent.   Significant Hospital Events: Including procedures, antibiotic start and stop dates in addition to other pertinent events   9/8 - Presented to [facility] with abdominal pain. CT A/P with extensive pneumoperitoneum 9/9 - Ex-lap (no colonic perforation discovered, large necrotic fluid present surrounding pancreas. Transferred to ICU post-op, intubated and sedated.   Interim History / Subjective:  Remained afebrile overnight, white count trended down from 40 K to 33 Patient was titrated off vasopressors, yesterday was on Levophed and vasopressin Serum creatinine continue to trend up  Objective   Blood pressure 106/60, pulse 83, temperature 98.1 F (36.7 C), temperature source Oral, resp. rate 16, height 5\' 9"  (1.753 m), weight 98.2 kg, SpO2 95 %.    Vent Mode: PRVC FiO2 (%):   [50 %] 50 % Set Rate:  [16 bmp] 16 bmp Vt Set:  [560 mL] 560 mL PEEP:  [8 cmH20] 8 cmH20 Plateau Pressure:  [18 cmH20-19 cmH20] 18 cmH20   Intake/Output Summary (Last 24 hours) at 10/23/2020 0826 Last data filed at 10/23/2020 0708 Gross per 24 hour  Intake 4914.39 ml  Output 1846 ml  Net 3068.39 ml   Filed Weights   11/08/2020 2316 10/22/20 0202 10/23/20 0200  Weight: 99.8 kg 93.7 kg 98.2 kg   Examination:   Physical exam: General: Crtitically ill-appearing middle-aged obese Caucasian male, orally intubated HEENT: [place]/AT, eyes anicteric.  ETT and OGT in place Neuro: Eyes closed, does not open, not following commands, moving all 4 extremities spontaneously Chest: Coarse breath sounds, no wheezes or rhonchi Heart: Regular rate and rhythm, no murmurs or gallops Abdomen: Soft, distended, absent bowel sounds Skin: No rash  Resolved Hospital Problem list   N/A  Assessment & Plan:  Septic Shock 2/2 Necrotizing Pancreatitis with pneumoperitoneum status post ex -lap Alcohol abuse  Patient remained afebrile White count is trending down from 40 K to 33 Vasopressors were titrated off Stop maintenance IV fluid We will give 1 L of IV fluid bolus with LR Continue to keep n.p.o. Cultures have been negative so far Continue IV antibiotics with linezolid, Zosyn and fluconazole Trend lactate Continue stress dose steroid Continue thiamine and folate Monitor for signs of alcohol withdrawal  Acute respiratory failure with hypoxia likely due to aspiration pneumonia Continue lung protective ventilation Trend ABGs Continue titrate FiO2 and PEEP SBT as tolerated Aspiration precautions  Acute toxic-metabolic encephalopathy: Secondary to sepsis. High risk for alcohol withdrawal in the  setting of heavy AUD.  Minimize sedation Currently on fentanyl 150 Off propofol  Acute kidney injury, likely due to septic ATN Acute alcoholic hepatitis Serum creatinine continue to rise, currently at  2 Patient is started making some urine Will give 1 L IV fluid Avoid nephrotoxic agents LFTs are improving after improvement in shock  Acute Blood Loss Anemia, postsurgical Patient's hemoglobin was 11.4 yesterday, dropped to 6.7 He received 1 unit PRBC Monitor H&H and transfuse if less than 7  Hyponatremia: In the setting of critical illness. Possibly an aspect of beer potomania. Stable at this time.  Closely monitor  Severe protein calorie malnutrition Started on TPN  Diabetes type 2 with hyperglycemia Hemoglobin A1c was 6.8 He is hyperglycemic likely due to steroid therapy for shock Continue sliding scale insulin, with CBG goal 140-180  Hyperphosphatemia, likely related to AKI Closely monitor electrolytes  Best Practice (right click and "Reselect all SmartList Selections" daily)   Diet/type: NPO with oral meds.  TPN DVT prophylaxis: prophylactic heparin  GI prophylaxis: PPI Lines: Central line and yes and it is still needed Foley:  Yes, and it is still needed Code Status:  full code Last date of multidisciplinary goals of care discussion [9/10, patient's wife was updated at bedside, decision was to continue full aggressive care  Labs   CBC: Recent Labs  Lab 11/05/20 1506 November 05, 2020 2326 10/22/20 0312 10/22/20 0357 10/22/20 0826 10/22/20 1753 10/23/20 0425  WBC 29.9*  --   --  40.9* 53.9* 39.2* 33.5*  HGB 11.4*   < > 9.2* 8.6* 9.8* 7.7* 6.7*  HCT 34.1*   < > 27.0* 26.1* 29.1* 22.9* 20.5*  MCV 107.9*  --   --  107.4* 106.2* 107.5* 109.0*  PLT 322  --   --  316 363 250 224   < > = values in this interval not displayed.   Basic Metabolic Panel: Recent Labs  Lab 11/05/20 1506 11/05/2020 2213 11-05-2020 2326 10/22/20 0312 10/22/20 0357 10/22/20 0826 10/22/20 1753 10/23/20 0425  NA 124* 124*   < > 125* 126* 125* 126* 127*  K 4.7  --    < > 4.8 5.0 5.0 5.0 5.0  CL 84*  --   --   --  89* 90* 90* 92*  CO2 26  --   --   --  23 21* 24 25  GLUCOSE 199*  --   --   --   205* 174* 185* 206*  BUN 26*  --   --   --  30* 33* 34* 40*  CREATININE 1.19  --   --   --  1.29* 1.86* 1.92* 2.07*  CALCIUM 8.1*  --   --   --  8.4* 7.8* 7.8* 7.7*  MG  --  2.3  --   --  2.2  --   --  2.4  PHOS  --  4.6  --   --  6.4*  --   --  7.7*   < > = values in this interval not displayed.   GFR: Estimated Creatinine Clearance: 48.2 mL/min (A) (by C-G formula based on SCr of 2.07 mg/dL (H)). Recent Labs  Lab 11/05/20 2213 10/22/20 0357 10/22/20 0633 10/22/20 0826 10/22/20 1304 10/22/20 1753 10/23/20 0425  PROCALCITON 16.09 21.00  --   --   --   --   --   WBC  --  40.9*  --  53.9*  --  39.2* 33.5*  LATICACIDVEN 2.0*  --  3.8* 4.9* 3.5*  --   --  Liver Function Tests: Recent Labs  Lab October 26, 2020 1506 10/22/20 0826 10/23/20 0425  AST 76* 75* 57*  ALT 53* 43 34  ALKPHOS 117 122 85  BILITOT 1.4* 1.6* 1.3*  PROT 6.1* 5.0* 4.9*  ALBUMIN 1.7* 2.0* 2.4*   Recent Labs  Lab October 26, 2020 1506  LIPASE 25   Recent Labs  Lab 2020/10/26 2213  AMMONIA 37*   ABG    Component Value Date/Time   PHART 7.452 (H) 10/22/2020 0312   PCO2ART 38.4 10/22/2020 0312   PO2ART 207 (H) 10/22/2020 0312   HCO3 26.8 10/22/2020 0312   TCO2 28 10/22/2020 0312   O2SAT 100.0 10/22/2020 0312   Coagulation Profile: Recent Labs  Lab 10-26-2020 2102 10/22/20 0357 10/22/20 0826  INR 2.1* 2.1* 2.0*   Cardiac Enzymes: Recent Labs  Lab 10/26/20 1550  CKTOTAL 27*   HbA1C: Hgb A1c MFr Bld  Date/Time Value Ref Range Status  10/26/2020 10:13 PM 6.8 (H) 4.8 - 5.6 % Final    Comment:    (NOTE) Pre diabetes:          5.7%-6.4%  Diabetes:              >6.4%  Glycemic control for   <7.0% adults with diabetes    CBG: Recent Labs  Lab 10/22/20 1516 10/22/20 1933 10/22/20 2336 10/23/20 0437 10/23/20 0737  GLUCAP 172* 171* 179* 148* 236*    Total critical care time: 48 minutes  Performed by: Cheri Fowler   Critical care time was exclusive of separately billable procedures and  treating other patients.   Critical care was necessary to treat or prevent imminent or life-threatening deterioration.   Critical care was time spent personally by me on the following activities: development of treatment plan with patient and/or surrogate as well as nursing, discussions with consultants, evaluation of patient's response to treatment, examination of patient, obtaining history from patient or surrogate, ordering and performing treatments and interventions, ordering and review of laboratory studies, ordering and review of radiographic studies, pulse oximetry and re-evaluation of patient's condition.   Cheri Fowler MD [facility] See Amion for pager If no response to pager, please call [phone] until 7pm After 7pm, Please call E-link [phone]

## 2020-10-23 NOTE — Progress Notes (Signed)
Nutrition Follow-up  DOCUMENTATION CODES:   Severe malnutrition in context of acute illness/injury, Not applicable  INTERVENTION:   TPN dosing per Pharmacy to meet 100% of nutrition needs as able.  Monitor magnesium, potassium, and phosphorus BID for at least 2 days, MD to replete as needed, as pt is at risk for refeeding syndrome given severe malnutrition, minimal intake x 1 week, significant weigh loss.  NUTRITION DIAGNOSIS:   Severe Malnutrition related to acute illness (infected necrotic pancreatitis) as evidenced by moderate fat depletion, moderate muscle depletion, energy intake < or equal to 50% for > or equal to 5 days, percent weight loss (8.4% weight loss in 1.5 months).  Ongoing   GOAL:   Patient will meet greater than or equal to 90% of their needs  Progressing with initiation of TPN  MONITOR:   Vent status, Labs, Weight trends, Skin, I & O's  REASON FOR ASSESSMENT:   Ventilator    ASSESSMENT:   55 year old male who presented to the ED on 9/08 with AMS and abdominal pain. Pt seen at [facility] 1 week PTA and was diagnosed with AKI and pancreatitis. PMH of HTN, gout, tobacco abuse, EtOH abuse. Pt admitted with sepsis secondary to infected necrotic pancreatitis.  9/9 S/P ex lap with pancreatic debridement for infected necrotic pancreatitis.  Received consult for new TPN being initiated today. TPN at 35 ml/h will provide 1023 kcal and 59 gm protein daily. Advancing TPN slowly d/t risk of refeeding syndrome.    Patient remains intubated on ventilator support MV: 8.3 L/min Temp (24hrs), Avg:97.9 F (36.6 C), Min:96.9 F (36.1 C), Max:98.2 F (36.8 C)  Labs reviewed. Na 127, Phos 7.7 CBG: [id number]  Medications reviewed and include IV antibiotics, Fentanyl, Levophed, Colace, solucortef, Novolog, Protonix, Miralax. IVF: LR at 100 ml/h (d/c at 1800)  I/O +6.1 L since admission OG output 600 ml x 24 hours UOP 596 ml x 24 hours JP drain x 2 output 760 ml x  24 hours  Weight up to 98.2 kg today from 93.7 kg 9/9.  Diet Order:   Diet Order             Diet NPO time specified  Diet effective now                   EDUCATION NEEDS:   No education needs have been identified at this time  Skin:  Skin Assessment: Skin Integrity Issues: Skin Integrity Issues:: Stage I, Incisions Stage I: coccyx Incisions: abdomen  Last BM:  no documented BM  Height:   Ht Readings from Last 1 Encounters:  27-Oct-2020 5\' 9"  (1.753 m)    Weight:   Wt Readings from Last 1 Encounters:  10/23/20 98.2 kg    BMI:  Body mass index is 31.97 kg/m.  Estimated Nutritional Needs:   Kcal:  2200-2400  Protein:  125-145 grams  Fluid:  >/= 2.0 L   12/23/20, RD, LDN, CNSC Please refer to Amion for contact information.

## 2020-10-23 NOTE — Progress Notes (Signed)
eLink Physician-Brief Progress Note Patient Name: Paul Chambers DOB: 04/06/1965 MRN: [id number]   Date of Service  10/23/2020  HPI/Events of Note  Hgb 6.7  eICU Interventions  Transfuse 1 unit prbc     Intervention Category Intermediate Interventions: Other:  Henry Russel, P 10/23/2020, 5:32 AM

## 2020-10-23 NOTE — Progress Notes (Addendum)
Progress Note  2 Days Post-Op  Subjective: Intubated, sedated. Wife and RN bedside.   Objective: Vital signs in last 24 hours: Temp:  [96.9 F (36.1 C)-98.5 F (36.9 C)] 97.8 F (36.6 C) (09/10 0930) Pulse Rate:  [75-105] 82 (09/10 0930) Resp:  [10-23] 16 (09/10 0930) BP: (90-120)/(54-76) 97/63 (09/10 0900) SpO2:  [92 %-97 %] 94 % (09/10 0930) Arterial Line BP: (88-230)/(6-67) 104/47 (09/10 0930) FiO2 (%):  [50 %] 50 % (09/10 0930) Weight:  [98.2 kg] 98.2 kg (09/10 0200) Last BM Date:  (pta)  Intake/Output from previous day: 09/09 0701 - 09/10 0700 In: 5052.6 [I.V.:2857.3; Blood:640; NG/GT:200; IV Piggyback:1335.3] Out: 1956 [Urine:596; Emesis/NG output:600; Drains:760] Intake/Output this shift: Total I/O In: 503.3 [Blood:503.3] Out: -   PE: General: intubated, sedated HEENT: head is normocephalic, atraumatic. Mouth is pink and moist Heart: Palpable radial and pedal pulses bilaterally Lungs: on vent Abd: distended. Midline wound with dressing c/d/I - removed and replaced for exam, wound clean without surrounding erythema. Drains with cloudy bloody output MSK: bilateral upper and lower extremities without edema Skin: warm and dry   Lab Results:  Recent Labs    10/22/20 1753 10/23/20 0425  WBC 39.2* 33.5*  HGB 7.7* 6.7*  HCT 22.9* 20.5*  PLT 250 224   BMET Recent Labs    10/22/20 1753 10/23/20 0425  NA 126* 127*  K 5.0 5.0  CL 90* 92*  CO2 24 25  GLUCOSE 185* 206*  BUN 34* 40*  CREATININE 1.92* 2.07*  CALCIUM 7.8* 7.7*   PT/INR Recent Labs    10/22/20 0357 10/22/20 0826  LABPROT 23.7* 22.4*  INR 2.1* 2.0*   CMP     Component Value Date/Time   NA 127 (L) 10/23/2020 0425   K 5.0 10/23/2020 0425   CL 92 (L) 10/23/2020 0425   CO2 25 10/23/2020 0425   GLUCOSE 206 (H) 10/23/2020 0425   BUN 40 (H) 10/23/2020 0425   CREATININE 2.07 (H) 10/23/2020 0425   CALCIUM 7.7 (L) 10/23/2020 0425   PROT 4.9 (L) 10/23/2020 0425   ALBUMIN 2.4 (L)  10/23/2020 0425   AST 57 (H) 10/23/2020 0425   ALT 34 10/23/2020 0425   ALKPHOS 85 10/23/2020 0425   BILITOT 1.3 (H) 10/23/2020 0425   GFRNONAA 38 (L) 10/23/2020 0425   Lipase     Component Value Date/Time   LIPASE 25 11/01/2020 1506       Studies/Results: CT Head Wo Contrast  Result Date: 10/28/2020 CLINICAL DATA:  Altered mental status. EXAM: CT HEAD WITHOUT CONTRAST TECHNIQUE: Contiguous axial images were obtained from the base of the skull through the vertex without intravenous contrast. COMPARISON:  None. FINDINGS: Brain: No evidence of acute infarction, hemorrhage, hydrocephalus, extra-axial collection or mass lesion/mass effect. Vascular: No hyperdense vessel or unexpected calcification. Skull: Normal. Negative for fracture or focal lesion. Sinuses/Orbits: No acute finding. Other: None. IMPRESSION: No acute intracranial abnormality seen. Electronically Signed   By: Lupita Raider M.D.   On: 11/01/2020 18:56   CT ABDOMEN PELVIS W CONTRAST  Result Date: 10/18/2020 CLINICAL DATA:  Abdominal abscess/infection suspected EXAM: CT ABDOMEN AND PELVIS WITH CONTRAST TECHNIQUE: Multidetector CT imaging of the abdomen and pelvis was performed using the standard protocol following bolus administration of intravenous contrast. CONTRAST:  OMNIPAQUE IOHEXOL 350 MG/ML SOLN COMPARISON:  None. FINDINGS: Lower chest: Small left pleural effusion. Bilateral lower lobe airspace opacities, left greater than right. Hepatobiliary: No focal hepatic abnormality. Gallbladder unremarkable. Pancreas: Pancreas is poorly visualized due  to extensive fluid and gas dissecting throughout the retroperitoneum. Fluid in the region of the pancreatic body and tail. Spleen: No focal abnormality.  Normal size. Adrenals/Urinary Tract: No adrenal abnormality. No focal renal abnormality. No stones or hydronephrosis. Urinary bladder is unremarkable. Stomach/Bowel: There is extensive gas and fluid dissecting throughout the  retroperitoneum. This continues into the peritoneum with pneumoperitoneum and ascites. This most likely reflects perforated hollow viscus. Given the degree of involvement of the retroperitoneum, I would suspect a retroperitoneal perforation, possibly perforated duodenal ulcer. Other possible source would be the descending duodenum although no real concerning appearance of the descending duodenum. Less likely but possible would be perforated peritoneal bowel. Vascular/Lymphatic: No evidence of aneurysm or adenopathy. Reproductive: No visible focal abnormality. Other: Extensive retroperitoneal fluid and gas as well as pneumoperitoneum and ascites as described above. Musculoskeletal: No acute bony abnormality. IMPRESSION: Extensive gas and fluid dissecting throughout the retroperitoneum and likely extending into the peritoneum where there is pneumoperitoneum and ascites. Appearance is most compatible with perforated bowel, likely in the retroperitoneum with duodenal the most likely source although exact source is not readily apparent. Pancreas poorly visualized with fluid and gas in the region of the body and tail. While necrotizing pancreatitis could have this appearance, this is felt less likely given the extent of retroperitoneal gas and fluid and a normal lipase. Critical Value/emergent results were called by telephone at the time of interpretation on 11/12/2020 at 8:42 pm to provider Gerhard Munch , who verbally acknowledged these results. Electronically Signed   By: Charlett Nose M.D.   On: 10/20/2020 20:49   DG CHEST PORT 1 VIEW  Result Date: 10/22/2020 CLINICAL DATA:  Central line placement EXAM: PORTABLE CHEST 1 VIEW COMPARISON:  11/02/2020 FINDINGS: Endotracheal tube is 4.5 cm above the carina. Right central line tip in the SVC. No pneumothorax. NG tube is in the stomach. Small left pleural effusion. Bilateral lower lobe airspace opacities could reflect atelectasis or infiltrates. IMPRESSION: Support  devices in expected position.  No pneumothorax. Bilateral lower lobe atelectasis or infiltrates. Small left effusion. Electronically Signed   By: Charlett Nose M.D.   On: 10/22/2020 02:23   DG Chest Port 1 View  Result Date: 11/10/2020 CLINICAL DATA:  sob EXAM: PORTABLE CHEST 1 VIEW COMPARISON:  None. FINDINGS: The cardiomediastinal silhouette is within normal limits. No pleural effusion. No pneumothorax. Left lung base consolidation. No acute osseous abnormality. IMPRESSION: Left lung base consolidation suggestive of pneumonia in the appropriate clinical context. Electronically Signed   By: Olive Bass M.D.   On: 11/06/2020 16:33    Anti-infectives: Anti-infectives (From admission, onward)    Start     Dose/Rate Route Frequency Ordered Stop   10/22/20 1800  vancomycin (VANCOREADY) IVPB 1500 mg/300 mL  Status:  Discontinued        1,500 mg 150 mL/hr over 120 Minutes Intravenous Every 24 hours 10/27/2020 2225 10/22/20 0759   10/22/20 0900  linezolid (ZYVOX) IVPB 600 mg        600 mg 300 mL/hr over 60 Minutes Intravenous Every 12 hours 10/22/20 0800     10/22/20 0300  piperacillin-tazobactam (ZOSYN) IVPB 3.375 g        3.375 g 12.5 mL/hr over 240 Minutes Intravenous Every 8 hours 10/15/2020 2225     10/31/2020 2335  metroNIDAZOLE (FLAGYL) IVPB 500 mg  Status:  Discontinued        500 mg 100 mL/hr over 60 Minutes Intravenous Every 12 hours 11/10/2020 2142 10/22/20 0747   11/12/2020  2248  fluconazole (DIFLUCAN) IVPB 400 mg        400 mg 100 mL/hr over 120 Minutes Intravenous Every 24 hours 10/27/2020 2142     10/27/2020 1748  vancomycin (VANCOREADY) IVPB 2000 mg/400 mL        2,000 mg 200 mL/hr over 120 Minutes Intravenous  Once 11/02/2020 1706 10/31/2020 2113   10/30/2020 1715  piperacillin-tazobactam (ZOSYN) IVPB 3.375 g        3.375 g 100 mL/hr over 30 Minutes Intravenous  Once 10/17/2020 1706 11/10/2020 1903        Assessment/Plan Infected necrotic pancreatitis - POD1 s/p ex lap with pancreatic  debridement Dr. Sheliah Hatch 9/9 - off levophed - WBC 33.5 (39.2). continue abx - hgb 6.7 (7.7) transfuse as needed - superior drain to head of pancreas (drain 1) - 575 ml out - inferior drain through right abdomen towards LUQ (drain 2) - 185 ml out - continue drains - do not recommend further surgery at this time. Continue medical management/supportive care per CCM and greatly appreciate their help  FEN: Okay for TFs ID: linezolid, zosyn, fluconazole VTE: heparin subq  ETOH use AKI Acute respiratory failure T2DM   LOS: 2 days    Eric Form, [facility] 10/23/2020, 9:45 AM Please see Amion for pager number during day hours 7:00am-4:30pm

## 2020-10-23 NOTE — Progress Notes (Signed)
PHARMACY - TOTAL PARENTERAL NUTRITION CONSULT NOTE   Indication:  severe pancreatitis  Patient Measurements: Height: 5\' 9"  (175.3 cm) Weight: 98.2 kg (216 lb 7.9 oz) IBW/kg (Calculated) : 70.7 TPN AdjBW (KG): 78 Body mass index is 31.97 kg/m.  Assessment: 57 yom presenting 9/8 with septic shock due to necrotizing pancreatitis with pneumoperitoneum s/p ex-lap 9/9. Pt transferred to ICU post-op, intubated and sedated (fentanyl only currently, propofol d/c'd 9/9). Transitioned off vasopressors 9/9. Pharmacy consulted to start TPN for severe pancreatitis. Pt at risk of refeeding with minimal PO intake x 1 week PTA, decreased PO intake x 1.5 months PTA with significant weight loss and hx heavy alcohol abuse.  Glucose / Insulin: A1c 6.8 (no meds pta). CBGs uncontrolled 171-236 on stress dose steroids (HC 100mg  IV q12h). Utilized 16 units mSSI in last 24hrs (prior to TPN) Electrolytes: Na 127 (124 on admit), K stable 5, Cl low but up to 92, phos up to 7.7, others WNL Renal: AKI - SCr up to 2.07 (1.19 on admit), BUN up to 40 Hepatic: AST trend down to 57, ALT down to WNL, Tbili down to 1.3, TG still pending, albumin 2.4 Intake / Output; MIVF: NGT output 569ml/24hrs, drain output 1130ml/24hrs, UOP 0.75ml/kg/hr; MIVF: LR at 158ml/hr. Net +4.6L this admit GI Imaging: 9/8 CT A/P - extensive pneumoperitoneum GI Surgeries / Procedures: 9/9 ex-lap (no colonic perforation, large necrotic fluid present surrounding pancreas)  Central access: CVC 9/9 TPN start date: 9/10  Nutritional Goals: Goal TPN rate is 75 mL/hr (provides 126 g of protein and 2192 kcals per day)  RD Assessment: Estimated Needs Total Energy Estimated Needs: 2200-2400 Total Protein Estimated Needs: 125-145 grams Total Fluid Estimated Needs: >/= 2.0 L  Current Nutrition:  NPO and TPN  Plan:  Start TPN at 13mL/hr at 1800. Titrate to goal as tolerated. Electrolytes in TPN: Na 69mEq/L, remove K/Phos, Ca 79mEq/L, Mg 87mEq/L, and remove  Phos. Cl:Ac 1:2 Add standard MVI and trace elements to TPN + d/c folic acid/thiamine IV orders and add to TPN bag Intensify SSI to Resistant q4h and adjust as needed. May need to add insulin to TPN bag tomorrow depending on CCM plan for stress dose steroids  Reduce MIVF to 65 mL/hr at 1800 when TPN bag hung Monitor TPN labs F/u Surgery plans - may require re-exploration in 24hrs per notes   4m, PharmD, BCPS Please check AMION for all [facility] contact numbers Clinical Pharmacist 10/23/2020 10:52 AM

## 2020-10-24 DIAGNOSIS — A419 Sepsis, unspecified organism: Secondary | ICD-10-CM | POA: Diagnosis not present

## 2020-10-24 DIAGNOSIS — K8591 Acute pancreatitis with uninfected necrosis, unspecified: Secondary | ICD-10-CM | POA: Diagnosis not present

## 2020-10-24 DIAGNOSIS — E871 Hypo-osmolality and hyponatremia: Secondary | ICD-10-CM | POA: Diagnosis not present

## 2020-10-24 DIAGNOSIS — E8809 Other disorders of plasma-protein metabolism, not elsewhere classified: Secondary | ICD-10-CM | POA: Diagnosis not present

## 2020-10-24 DIAGNOSIS — E43 Unspecified severe protein-calorie malnutrition: Secondary | ICD-10-CM

## 2020-10-24 LAB — COMPREHENSIVE METABOLIC PANEL
ALT: 36 U/L (ref 0–44)
AST: 56 U/L — ABNORMAL HIGH (ref 15–41)
Albumin: 2 g/dL — ABNORMAL LOW (ref 3.5–5.0)
Alkaline Phosphatase: 112 U/L (ref 38–126)
Anion gap: 12 (ref 5–15)
BUN: 51 mg/dL — ABNORMAL HIGH (ref 6–20)
CO2: 24 mmol/L (ref 22–32)
Calcium: 7.8 mg/dL — ABNORMAL LOW (ref 8.9–10.3)
Chloride: 92 mmol/L — ABNORMAL LOW (ref 98–111)
Creatinine, Ser: 2.18 mg/dL — ABNORMAL HIGH (ref 0.61–1.24)
GFR, Estimated: 36 mL/min — ABNORMAL LOW (ref >60)
Glucose, Bld: 285 mg/dL — ABNORMAL HIGH (ref 70–99)
Potassium: 4.7 mmol/L (ref 3.5–5.1)
Sodium: 128 mmol/L — ABNORMAL LOW (ref 135–145)
Total Bilirubin: 1.2 mg/dL (ref 0.3–1.2)
Total Protein: 5.2 g/dL — ABNORMAL LOW (ref 6.5–8.1)

## 2020-10-24 LAB — GLUCOSE, CAPILLARY
Glucose-Capillary: 232 mg/dL — ABNORMAL HIGH (ref 70–99)
Glucose-Capillary: 240 mg/dL — ABNORMAL HIGH (ref 70–99)
Glucose-Capillary: 250 mg/dL — ABNORMAL HIGH (ref 70–99)
Glucose-Capillary: 255 mg/dL — ABNORMAL HIGH (ref 70–99)
Glucose-Capillary: 271 mg/dL — ABNORMAL HIGH (ref 70–99)
Glucose-Capillary: 289 mg/dL — ABNORMAL HIGH (ref 70–99)

## 2020-10-24 LAB — CBC
HCT: 23.7 % — ABNORMAL LOW (ref 39.0–52.0)
Hemoglobin: 7.9 g/dL — ABNORMAL LOW (ref 13.0–17.0)
MCH: 35.6 pg — ABNORMAL HIGH (ref 26.0–34.0)
MCHC: 33.3 g/dL (ref 30.0–36.0)
MCV: 106.8 fL — ABNORMAL HIGH (ref 80.0–100.0)
Platelets: 205 10*3/uL (ref 150–400)
RBC: 2.22 MIL/uL — ABNORMAL LOW (ref 4.22–5.81)
RDW: 17.1 % — ABNORMAL HIGH (ref 11.5–15.5)
WBC: 33.7 10*3/uL — ABNORMAL HIGH (ref 4.0–10.5)
nRBC: 0.1 % (ref 0.0–0.2)

## 2020-10-24 LAB — POCT I-STAT 7, (LYTES, BLD GAS, ICA,H+H)
Acid-Base Excess: 1 mmol/L (ref 0.0–2.0)
Bicarbonate: 26.6 mmol/L (ref 20.0–28.0)
Calcium, Ion: 1.04 mmol/L — ABNORMAL LOW (ref 1.15–1.40)
HCT: 24 % — ABNORMAL LOW (ref 39.0–52.0)
Hemoglobin: 8.2 g/dL — ABNORMAL LOW (ref 13.0–17.0)
O2 Saturation: 93 %
Patient temperature: 98.3
Potassium: 4.7 mmol/L (ref 3.5–5.1)
Sodium: 128 mmol/L — ABNORMAL LOW (ref 135–145)
TCO2: 28 mmol/L (ref 22–32)
pCO2 arterial: 46.2 mmHg (ref 32.0–48.0)
pH, Arterial: 7.368 (ref 7.350–7.450)
pO2, Arterial: 71 mmHg — ABNORMAL LOW (ref 83.0–108.0)

## 2020-10-24 LAB — PHOSPHORUS: Phosphorus: 6.3 mg/dL — ABNORMAL HIGH (ref 2.5–4.6)

## 2020-10-24 LAB — MAGNESIUM: Magnesium: 2.6 mg/dL — ABNORMAL HIGH (ref 1.7–2.4)

## 2020-10-24 LAB — TRIGLYCERIDES: Triglycerides: 98 mg/dL (ref <150)

## 2020-10-24 MED ORDER — TRACE MINERALS CU-MN-SE-ZN 300-55-60-3000 MCG/ML IV SOLN
INTRAVENOUS | Status: AC
  Filled 2020-10-24: qty 392

## 2020-10-24 MED ORDER — INSULIN GLARGINE-YFGN 100 UNIT/ML ~~LOC~~ SOLN
8.0000 [IU] | Freq: Two times a day (BID) | SUBCUTANEOUS | Status: DC
  Administered 2020-10-24 (×2): 8 [IU] via SUBCUTANEOUS
  Filled 2020-10-24 (×4): qty 0.08

## 2020-10-24 MED ORDER — DEXMEDETOMIDINE HCL IN NACL 400 MCG/100ML IV SOLN
0.4000 ug/kg/h | INTRAVENOUS | Status: DC
  Administered 2020-10-24: 1.1 ug/kg/h via INTRAVENOUS
  Administered 2020-10-24: 1.2 ug/kg/h via INTRAVENOUS
  Administered 2020-10-24: 0.4 ug/kg/h via INTRAVENOUS
  Administered 2020-10-24 (×2): 1.2 ug/kg/h via INTRAVENOUS
  Administered 2020-10-25: 0.7 ug/kg/h via INTRAVENOUS
  Administered 2020-10-25 (×2): 1.2 ug/kg/h via INTRAVENOUS
  Administered 2020-10-25: 0.8 ug/kg/h via INTRAVENOUS
  Administered 2020-10-25: 0.5 ug/kg/h via INTRAVENOUS
  Administered 2020-10-26 (×2): 1.2 ug/kg/h via INTRAVENOUS
  Filled 2020-10-24 (×12): qty 100

## 2020-10-24 MED ORDER — FLUCONAZOLE IN SODIUM CHLORIDE 200-0.9 MG/100ML-% IV SOLN
200.0000 mg | INTRAVENOUS | Status: DC
  Administered 2020-10-24: 200 mg via INTRAVENOUS
  Filled 2020-10-24: qty 100

## 2020-10-24 MED ORDER — CHLORHEXIDINE GLUCONATE 0.12 % MT SOLN
OROMUCOSAL | Status: AC
  Administered 2020-10-24: 15 mL via OROMUCOSAL
  Filled 2020-10-24: qty 15

## 2020-10-24 MED ORDER — LACTATED RINGERS IV BOLUS
1000.0000 mL | Freq: Once | INTRAVENOUS | Status: AC
  Administered 2020-10-24: 1000 mL via INTRAVENOUS

## 2020-10-24 NOTE — Progress Notes (Addendum)
Progress Note  3 Days Post-Op  Subjective: Intubated, sedated   Objective: Vital signs in last 24 hours: Temp:  [97.7 F (36.5 C)-98.2 F (36.8 C)] 98.1 F (36.7 C) (09/11 0400) Pulse Rate:  [73-85] 80 (09/11 0700) Resp:  [11-24] 13 (09/11 0700) BP: (89-109)/(57-73) 107/64 (09/11 0700) SpO2:  [89 %-99 %] 94 % (09/11 0700) Arterial Line BP: (99-133)/(44-62) 105/49 (09/11 0700) FiO2 (%):  [50 %-60 %] 60 % (09/11 0321) Weight:  [100.6 kg] 100.6 kg (09/11 0403) Last BM Date:  (No Value pta)  Intake/Output from previous day: 09/10 0701 - 09/11 0700 In: 4028.1 [I.V.:1581.5; Blood:315; NG/GT:180; IV Piggyback:1951.6] Out: 1355 [Urine:960; Emesis/NG output:50; Drains:345] Intake/Output this shift: No intake/output data recorded.  PE: General: intubated, sedated HEENT: head is normocephalic, atraumatic. Mouth is pink and moist Heart: Palpable radial and pedal pulses bilaterally Lungs: on vent Abd: distended. Midline wound with dressing c/d/I - removed and replaced for exam, wound clean without surrounding erythema. RUQ drains x2 with cloudy bloody/brown output Skin: warm and dry   Lab Results:  Recent Labs    10/23/20 1210 10/24/20 0349  WBC 35.7* 33.7*  HGB 7.5* 7.9*  HCT 22.3* 23.7*  PLT 245 205    BMET Recent Labs    10/23/20 0425 10/24/20 0349  NA 127* 128*  K 5.0 4.7  CL 92* 92*  CO2 25 24  GLUCOSE 206* 285*  BUN 40* 51*  CREATININE 2.07* 2.18*  CALCIUM 7.7* 7.8*    PT/INR Recent Labs    10/22/20 0357 10/22/20 0826  LABPROT 23.7* 22.4*  INR 2.1* 2.0*    CMP     Component Value Date/Time   NA 128 (L) 10/24/2020 0349   K 4.7 10/24/2020 0349   CL 92 (L) 10/24/2020 0349   CO2 24 10/24/2020 0349   GLUCOSE 285 (H) 10/24/2020 0349   BUN 51 (H) 10/24/2020 0349   CREATININE 2.18 (H) 10/24/2020 0349   CALCIUM 7.8 (L) 10/24/2020 0349   PROT 5.2 (L) 10/24/2020 0349   ALBUMIN 2.0 (L) 10/24/2020 0349   AST 56 (H) 10/24/2020 0349   ALT 36  10/24/2020 0349   ALKPHOS 112 10/24/2020 0349   BILITOT 1.2 10/24/2020 0349   GFRNONAA 36 (L) 10/24/2020 0349   Lipase     Component Value Date/Time   LIPASE 25 2020-11-19 1506       Studies/Results: No results found.  Anti-infectives: Anti-infectives (From admission, onward)    Start     Dose/Rate Route Frequency Ordered Stop   10/22/20 1800  vancomycin (VANCOREADY) IVPB 1500 mg/300 mL  Status:  Discontinued        1,500 mg 150 mL/hr over 120 Minutes Intravenous Every 24 hours 11-19-2020 2225 10/22/20 0759   10/22/20 0900  linezolid (ZYVOX) IVPB 600 mg        600 mg 300 mL/hr over 60 Minutes Intravenous Every 12 hours 10/22/20 0800     10/22/20 0300  piperacillin-tazobactam (ZOSYN) IVPB 3.375 g        3.375 g 12.5 mL/hr over 240 Minutes Intravenous Every 8 hours 11/19/2020 2225     11/19/20 2335  metroNIDAZOLE (FLAGYL) IVPB 500 mg  Status:  Discontinued        500 mg 100 mL/hr over 60 Minutes Intravenous Every 12 hours 11-19-20 2142 10/22/20 0747   11/19/20 2248  fluconazole (DIFLUCAN) IVPB 400 mg        400 mg 100 mL/hr over 120 Minutes Intravenous Every 24 hours 11/19/20 2142  10/25/2020 1748  vancomycin (VANCOREADY) IVPB 2000 mg/400 mL        2,000 mg 200 mL/hr over 120 Minutes Intravenous  Once 11/06/2020 1706 10/27/2020 2113   11/09/2020 1715  piperacillin-tazobactam (ZOSYN) IVPB 3.375 g        3.375 g 100 mL/hr over 30 Minutes Intravenous  Once 10/27/2020 1706 10/14/2020 1903        Assessment/Plan Infected necrotic pancreatitis - POD2 s/p ex lap with pancreatic debridement Dr. Sheliah Hatch 9/9 - off levophed - WBC 33.7 (35.7). continue abx - hgb 7.9 (7.5), s/p 1 uPRBC 9/10. transfuse as needed - superior drain to head of pancreas (drain 1) - 305 ml out - inferior drain through right abdomen towards LUQ (drain 2) - 40 ml out - continue drains - do not recommend further surgery at this time. Continue medical management/supportive care per CCM and greatly appreciate their  help  FEN: TPN ID: linezolid, zosyn, fluconazole VTE: heparin subq  ETOH use AKI Acute respiratory failure T2DM   LOS: 3 days    Paul Chambers, [facility] 10/24/2020, 7:54 AM Please see Amion for pager number during day hours 7:00am-4:30pm

## 2020-10-24 NOTE — Progress Notes (Signed)
On initial assessment, pt on 200 mcg of fentanyl per hour, dyssynchronous with the ventilator, not getting adequate volumes. MD ordered updated ceiling of fentanyl to 150 mcg per hour, despite being dyssynchronous and not ready to wean. 1 hr after rate adjustment, pt's synchrony worsened, ventilator volumes continue to be inadequate. RT aware and MD updated by RT and RN. Charge RN spoke to MD and obtained verbal order to initiate precedex.

## 2020-10-24 NOTE — Progress Notes (Signed)
NAME:  Paul Chambers, MRN:  [id number], DOB:  01/31/1966, LOS: 3 ADMISSION DATE:  10-29-2020, CONSULTATION DATE:  October 29, 2020 REFERRING MD:  EDP CHIEF COMPLAINT:  Abdominal Pain   History of Present Illness:  This 55 y.o. Caucasian male smoker presented to the [facility] Emergency Department via private vehicle with complaints of abdominal pain and altered mental status. At the time of clinical interview, the patient is quite somnolent, snoring loudly. He is maintaining his airway but not able to contribute meaningfully to clinical interview.  The patient's wife reports that this is the third presentation to a healthcare facility in the past month for abdominal pain ([facility], [facility] and now [facility]).  The patient's deteriorating mental status over the past 2 days prompted the wife to force the patient to come to the hospital.  In the ER tonight, CT head was negative for acute process, but CT abdomen showed an impressive pneumoperitoneum without an obvious location of perforation.  Surgery has already evaluated the patient and decided to reocmmend proceeding to surgery; the patient's wife provided consent.   Significant Hospital Events: Including procedures, antibiotic start and stop dates in addition to other pertinent events   9/8 - Presented to [facility] with abdominal pain. CT A/P with extensive pneumoperitoneum 9/9 - Ex-lap (no colonic perforation discovered, large necrotic fluid present surrounding pancreas. Transferred to ICU post-op, intubated and sedated.  9/10-received 1 unit PRBC for hemoglobin 6.7, went back on Levophed for short period of time  Interim History / Subjective:  No overnight issues, patient's white count remains at 33 He is afebrile He required reinitiation of Levophed yesterday for few hours, now off Serum creatinine slowly trending up  Objective   Blood pressure 107/64, pulse 80, temperature 98.1 F (36.7 C), temperature source Oral, resp. rate  16, height 5\' 9"  (1.753 m), weight 100.6 kg, SpO2 95 %.    Vent Mode: PRVC FiO2 (%):  [50 %-60 %] 50 % Set Rate:  [16 bmp] 16 bmp Vt Set:  [560 mL] 560 mL PEEP:  [8 cmH20] 8 cmH20 Plateau Pressure:  [16 cmH20-20 cmH20] 16 cmH20   Intake/Output Summary (Last 24 hours) at 10/24/2020 0831 Last data filed at 10/24/2020 0700 Gross per 24 hour  Intake 3870.39 ml  Output 1145 ml  Net 2725.39 ml   Filed Weights   10/22/20 0202 10/23/20 0200 10/24/20 0403  Weight: 93.7 kg 98.2 kg 100.6 kg   Examination:   Physical exam: General: Crtitically ill-appearing middle-aged obese Caucasian male, orally intubated HEENT: [place]/AT, eyes anicteric.  ETT and OGT in place Neuro: Eyes closed, does not open, not following commands, moving all 4 extremities spontaneously Chest: Coarse breath sounds, no wheezes or rhonchi Heart: Regular rate and rhythm, no murmurs or gallops Abdomen: Soft, distended, absent bowel sounds.  2 drains in place with bloody discharge Skin: No rash  Resolved Hospital Problem list   N/A  Assessment & Plan:  Septic Shock 2/2 infected necrotizing Pancreatitis with pneumoperitoneum status post ex -lap Alcohol abuse Patient remained afebrile White count remains elevated to 33 Off vasopressors We will give 1 L of IV fluid bolus with LR Continue to keep n.p.o., started on PPN Cultures have been negative so far Continue IV antibiotics with linezolid, Zosyn and fluconazole Lactate trended down to 1.3 Discontinue stress dose steroid Continue thiamine and folate Monitor for signs of alcohol withdrawal  Acute respiratory failure with hypoxia likely due to aspiration pneumonia Continue lung protective ventilation Trend ABGs Currently on 50% FiO2  with 8 of PEEP Trend ABGs SBT as tolerated Aspiration precautions  Acute toxic-metabolic encephalopathy: Secondary to sepsis. High risk for alcohol withdrawal in the setting of heavy AUD.  Minimize sedation Currently on fentanyl  150  Acute kidney injury, likely due to septic ATN Acute alcoholic hepatitis Serum creatinine continue to rise, currently at 2.1 He made 895 urine in last 24 hours Will give 1 L IV fluid with LR Avoid nephrotoxic agents LFTs are improving after improvement in shock  Acute Blood Loss Anemia, postsurgical He is status post 1 unit of PRBC Repeat hemoglobin is 7.9 Monitor H&H and transfuse if less than 7  Hyponatremia: In the setting of critical illness. Possibly an aspect of beer potomania. Stable at this time.  Closely monitor  Severe protein calorie malnutrition Started on TPN  Diabetes type 2 with hyperglycemia Hemoglobin A1c was 6.8 He is hyperglycemic likely due to steroid therapy for shock Started on Lantus 6 units twice daily Discontinued stress dose steroid Continue sliding scale insulin, with CBG goal 140-180  Hyperphosphatemia, likely related to AKI Closely monitor electrolytes  Best Practice (right click and "Reselect all SmartList Selections" daily)   Diet/type: NPO with oral meds.  TPN DVT prophylaxis: prophylactic heparin  GI prophylaxis: PPI Lines: Central line and yes and it is still needed Foley:  Yes, and it is still needed Code Status:  full code Last date of multidisciplinary goals of care discussion [9/10, patient's wife was updated at bedside, decision was to continue full aggressive care  Labs   CBC: Recent Labs  Lab 10/22/20 0826 10/22/20 1753 10/23/20 0425 10/23/20 1210 10/24/20 0349  WBC 53.9* 39.2* 33.5* 35.7* 33.7*  HGB 9.8* 7.7* 6.7* 7.5* 7.9*  HCT 29.1* 22.9* 20.5* 22.3* 23.7*  MCV 106.2* 107.5* 109.0* 106.7* 106.8*  PLT 363 250 224 245 205   Basic Metabolic Panel: Recent Labs  Lab 10/18/2020 2213 10/26/2020 2326 10/22/20 0357 10/22/20 0826 10/22/20 1753 10/23/20 0425 10/24/20 0349  NA 124*   < > 126* 125* 126* 127* 128*  K  --    < > 5.0 5.0 5.0 5.0 4.7  CL  --   --  89* 90* 90* 92* 92*  CO2  --   --  23 21* 24 25 24    GLUCOSE  --   --  205* 174* 185* 206* 285*  BUN  --   --  30* 33* 34* 40* 51*  CREATININE  --   --  1.29* 1.86* 1.92* 2.07* 2.18*  CALCIUM  --   --  8.4* 7.8* 7.8* 7.7* 7.8*  MG 2.3  --  2.2  --   --  2.4 2.6*  PHOS 4.6  --  6.4*  --   --  7.7* 6.3*   < > = values in this interval not displayed.   GFR: Estimated Creatinine Clearance: 46.4 mL/min (A) (by C-G formula based on SCr of 2.18 mg/dL (H)). Recent Labs  Lab 10/15/2020 2213 10/22/20 0357 10/22/20 12/22/20 10/22/20 0826 10/22/20 1304 10/22/20 1753 10/23/20 0425 10/23/20 0910 10/23/20 1210 10/24/20 0349  PROCALCITON 16.09 21.00  --   --   --   --   --   --   --   --   WBC  --  40.9*  --  53.9*  --  39.2* 33.5*  --  35.7* 33.7*  LATICACIDVEN 2.0*  --  3.8* 4.9* 3.5*  --   --  1.8  --   --    Liver Function Tests:  Recent Labs  Lab 10-27-20 1506 10/22/20 0826 10/23/20 0425 10/24/20 0349  AST 76* 75* 57* 56*  ALT 53* 43 34 36  ALKPHOS 117 122 85 112  BILITOT 1.4* 1.6* 1.3* 1.2  PROT 6.1* 5.0* 4.9* 5.2*  ALBUMIN 1.7* 2.0* 2.4* 2.0*   Recent Labs  Lab Oct 27, 2020 1506  LIPASE 25   Recent Labs  Lab Oct 27, 2020 2213  AMMONIA 37*   ABG    Component Value Date/Time   PHART 7.371 10/23/2020 0839   PCO2ART 45.1 10/23/2020 0839   PO2ART 92.0 10/23/2020 0839   HCO3 25.5 10/23/2020 0839   TCO2 28 10/22/2020 0312   O2SAT 96.6 10/23/2020 0839   Coagulation Profile: Recent Labs  Lab Oct 27, 2020 2102 10/22/20 0357 10/22/20 0826  INR 2.1* 2.1* 2.0*   Cardiac Enzymes: Recent Labs  Lab 10-27-2020 1550  CKTOTAL 27*   HbA1C: Hgb A1c MFr Bld  Date/Time Value Ref Range Status  10/27/2020 10:13 PM 6.8 (H) 4.8 - 5.6 % Final    Comment:    (NOTE) Pre diabetes:          5.7%-6.4%  Diabetes:              >6.4%  Glycemic control for   <7.0% adults with diabetes    CBG: Recent Labs  Lab 10/23/20 1545 10/23/20 2004 10/23/20 2333 10/24/20 0402 10/24/20 0745  GLUCAP 158* 220* 309* 289* 232*    Total critical care  time: 40 minutes  Performed by: Cheri Fowler   Critical care time was exclusive of separately billable procedures and treating other patients.   Critical care was necessary to treat or prevent imminent or life-threatening deterioration.   Critical care was time spent personally by me on the following activities: development of treatment plan with patient and/or surrogate as well as nursing, discussions with consultants, evaluation of patient's response to treatment, examination of patient, obtaining history from patient or surrogate, ordering and performing treatments and interventions, ordering and review of laboratory studies, ordering and review of radiographic studies, pulse oximetry and re-evaluation of patient's condition.   Cheri Fowler MD [facility] See Amion for pager If no response to pager, please call [phone] until 7pm After 7pm, Please call E-link [phone]

## 2020-10-24 NOTE — Progress Notes (Signed)
PHARMACY - TOTAL PARENTERAL NUTRITION CONSULT NOTE   Indication:  severe pancreatitis  Patient Measurements: Height: 5\' 9"  (175.3 cm) Weight: 100.6 kg (221 lb 12.5 oz) IBW/kg (Calculated) : 70.7 TPN AdjBW (KG): 78 Body mass index is 32.75 kg/m.  Assessment: 36 yom presenting 9/8 with septic shock due to necrotizing pancreatitis with pneumoperitoneum s/p ex-lap 9/9. Pt transferred to ICU post-op, intubated and sedated. Pharmacy consulted to start TPN for severe pancreatitis. Pt at risk of refeeding with minimal PO intake x 1 week PTA, decreased PO intake x 1.5 months PTA with significant weight loss and hx heavy alcohol abuse.  Patient intubated, currently sedated on fentanyl drip only, propofol d/c'd 9/9). Transitioned off vasopressors again 9/10.   Glucose / Insulin: A1c 6.8 (no meds pta). CBGs uncontrolled due to stress dose steroids, 232-309. Utilized 53 units mSSI in last 24hrs Stress dose steroids now d/c'd 9/11 CCM starting Semglee 8 units BID on 9/11 Electrolytes: Na up to 128 (124 on admit), K down to 4.7 (none in TPN), Cl low stable 92, Phos down to 6.3 (none in TPN), Mag up to 2.6, others WNL Renal: AKI - SCr up to 2.18 (1.19 on admit), BUN up to 51 Hepatic: AST trend down to 56, ALT / Tbili normalized, TG WNL, albumin 2 Intake / Output; MIVF: NGT output 158ml/24hrs, drain output 355ml/24hrs, UOP 0.37ml/kg/hr; MIVF d/c'd 9/10. Net +7.2L this admit GI Imaging: 9/8 CT A/P - extensive pneumoperitoneum GI Surgeries / Procedures: 9/9 ex-lap (no colonic perforation, large necrotic fluid present surrounding pancreas)  Central access: CVC 9/9 TPN start date: 9/10  Nutritional Goals: Goal TPN rate is 75 mL/hr (provides 126 g of protein and 2192 kcals per day)  RD Assessment: Estimated Needs Total Energy Estimated Needs: 2200-2400 Total Protein Estimated Needs: 125-145 grams Total Fluid Estimated Needs: >/= 2.0 L  Current Nutrition:  NPO and TPN  Plan:  Continue TPN at  39mL/hr at 1800 with CBGs uncontrolled. Titrate to goal as tolerated. Electrolytes in TPN: increase Na to 60mEq/L, remove K/Phos, Ca 73mEq/L, decrease Mg to 83mEq/L (may need to remove 9/12 based on trend). Cl:Ac to 1:1 Add standard MVI and trace elements + folic acid 1mg  and thiamine 100mg  to TPN Continue Resistant q4h SSI + CCM adding Semglee 8 units BID today - monitor CBGs closely with stress dose steroids d/c'd this AM and adjust regimen as needed. Dr. 11/12 ok with adding insulin to TPN bag as necessary but will hold off for today. Monitor TPN labs F/u Surgery plans - no need for further surgery at this time per 9/11 note   , PharmD, BCPS Please check AMION for all [facility] contact numbers Clinical Pharmacist 10/24/2020 9:26 AM

## 2020-10-25 ENCOUNTER — Inpatient Hospital Stay (HOSPITAL_COMMUNITY): Payer: BC Managed Care – PPO

## 2020-10-25 ENCOUNTER — Inpatient Hospital Stay: Payer: Self-pay

## 2020-10-25 DIAGNOSIS — A419 Sepsis, unspecified organism: Secondary | ICD-10-CM | POA: Diagnosis not present

## 2020-10-25 DIAGNOSIS — R6520 Severe sepsis without septic shock: Secondary | ICD-10-CM | POA: Diagnosis not present

## 2020-10-25 LAB — COMPREHENSIVE METABOLIC PANEL
ALT: 33 U/L (ref 0–44)
AST: 52 U/L — ABNORMAL HIGH (ref 15–41)
Albumin: 1.7 g/dL — ABNORMAL LOW (ref 3.5–5.0)
Alkaline Phosphatase: 113 U/L (ref 38–126)
Anion gap: 10 (ref 5–15)
BUN: 51 mg/dL — ABNORMAL HIGH (ref 6–20)
CO2: 25 mmol/L (ref 22–32)
Calcium: 8 mg/dL — ABNORMAL LOW (ref 8.9–10.3)
Chloride: 97 mmol/L — ABNORMAL LOW (ref 98–111)
Creatinine, Ser: 1.76 mg/dL — ABNORMAL HIGH (ref 0.61–1.24)
GFR, Estimated: 46 mL/min — ABNORMAL LOW (ref >60)
Glucose, Bld: 199 mg/dL — ABNORMAL HIGH (ref 70–99)
Potassium: 4.5 mmol/L (ref 3.5–5.1)
Sodium: 132 mmol/L — ABNORMAL LOW (ref 135–145)
Total Bilirubin: 1 mg/dL (ref 0.3–1.2)
Total Protein: 5.3 g/dL — ABNORMAL LOW (ref 6.5–8.1)

## 2020-10-25 LAB — CBC
HCT: 24.1 % — ABNORMAL LOW (ref 39.0–52.0)
Hemoglobin: 8 g/dL — ABNORMAL LOW (ref 13.0–17.0)
MCH: 35.4 pg — ABNORMAL HIGH (ref 26.0–34.0)
MCHC: 33.2 g/dL (ref 30.0–36.0)
MCV: 106.6 fL — ABNORMAL HIGH (ref 80.0–100.0)
Platelets: 197 10*3/uL (ref 150–400)
RBC: 2.26 MIL/uL — ABNORMAL LOW (ref 4.22–5.81)
RDW: 16.6 % — ABNORMAL HIGH (ref 11.5–15.5)
WBC: 25.7 10*3/uL — ABNORMAL HIGH (ref 4.0–10.5)
nRBC: 0.2 % (ref 0.0–0.2)

## 2020-10-25 LAB — GLUCOSE, CAPILLARY
Glucose-Capillary: 113 mg/dL — ABNORMAL HIGH (ref 70–99)
Glucose-Capillary: 117 mg/dL — ABNORMAL HIGH (ref 70–99)
Glucose-Capillary: 174 mg/dL — ABNORMAL HIGH (ref 70–99)
Glucose-Capillary: 177 mg/dL — ABNORMAL HIGH (ref 70–99)
Glucose-Capillary: 188 mg/dL — ABNORMAL HIGH (ref 70–99)
Glucose-Capillary: 198 mg/dL — ABNORMAL HIGH (ref 70–99)

## 2020-10-25 LAB — MAGNESIUM: Magnesium: 2.4 mg/dL (ref 1.7–2.4)

## 2020-10-25 LAB — PHOSPHORUS: Phosphorus: 4 mg/dL (ref 2.5–4.6)

## 2020-10-25 LAB — TRIGLYCERIDES: Triglycerides: 97 mg/dL (ref <150)

## 2020-10-25 MED ORDER — TRACE MINERALS CU-MN-SE-ZN 300-55-60-3000 MCG/ML IV SOLN
INTRAVENOUS | Status: AC
  Filled 2020-10-25: qty 840

## 2020-10-25 MED ORDER — FLUCONAZOLE IN SODIUM CHLORIDE 400-0.9 MG/200ML-% IV SOLN
400.0000 mg | INTRAVENOUS | Status: AC
  Administered 2020-10-25 – 2020-10-28 (×4): 400 mg via INTRAVENOUS
  Filled 2020-10-25 (×4): qty 200

## 2020-10-25 MED ORDER — SODIUM CHLORIDE 0.9% FLUSH
10.0000 mL | INTRAVENOUS | Status: DC | PRN
  Administered 2020-10-25 – 2020-11-04 (×3): 10 mL

## 2020-10-25 MED ORDER — ORAL CARE MOUTH RINSE
15.0000 mL | Freq: Two times a day (BID) | OROMUCOSAL | Status: DC
  Administered 2020-10-25 – 2020-10-28 (×6): 15 mL via OROMUCOSAL

## 2020-10-25 MED ORDER — INSULIN GLARGINE-YFGN 100 UNIT/ML ~~LOC~~ SOLN
10.0000 [IU] | Freq: Two times a day (BID) | SUBCUTANEOUS | Status: AC
  Administered 2020-10-25 – 2020-10-26 (×3): 10 [IU] via SUBCUTANEOUS
  Filled 2020-10-25 (×4): qty 0.1

## 2020-10-25 MED ORDER — SODIUM CHLORIDE 0.9% FLUSH
10.0000 mL | Freq: Two times a day (BID) | INTRAVENOUS | Status: DC
  Administered 2020-10-26 – 2020-11-13 (×31): 10 mL

## 2020-10-25 NOTE — Anesthesia Postprocedure Evaluation (Signed)
Anesthesia Post Note  Patient: Paul Chambers  Procedure(s) Performed: LAPAROSCOPY DIAGNOSTIC (Abdomen) EXPLORATORY LAPAROTOMY AND PANCREATIC DEBRIDEMENT     Patient location during evaluation: SICU Anesthesia Type: General Level of consciousness: sedated Pain management: pain level controlled Vital Signs Assessment: post-procedure vital signs reviewed and stable Respiratory status: patient remains intubated per anesthesia plan Cardiovascular status: stable Postop Assessment: no apparent nausea or vomiting Anesthetic complications: no   No notable events documented.  Last Vitals:  Vitals:   10/25/20 0700 10/25/20 0800  BP:    Pulse:    Resp:    Temp: 37.5 C 37.2 C  SpO2:      Last Pain:  Vitals:   10/25/20 0800  TempSrc: Oral  PainSc:                  Paul Chambers

## 2020-10-25 NOTE — Progress Notes (Addendum)
4 Days Post-Op  Subjective: CC: On vent. Wife at bedside. RN reports CCM is hoping to wean to extubation today.   Objective: Vital signs in last 24 hours: Temp:  [97.7 F (36.5 C)-99.5 F (37.5 C)] 98.9 F (37.2 C) (09/12 0800) Pulse Rate:  [58-81] 66 (09/12 0630) Resp:  [8-18] 16 (09/12 0630) BP: (96-120)/(69-79) 116/73 (09/12 0600) SpO2:  [92 %-98 %] 96 % (09/12 0630) Arterial Line BP: (109-150)/(50-66) 138/55 (09/12 0630) FiO2 (%):  [40 %-50 %] 40 % (09/12 0800) Weight:  [102.2 kg] 102.2 kg (09/12 0402) Last BM Date:  (PTA)  Intake/Output from previous day: 09/11 0701 - 09/12 0700 In: 3357.7 [I.V.:1738.5; NG/GT:210; IV Piggyback:1409.2] Out: 2650 [Urine:1980; Emesis/NG output:350; Drains:320] 25F - 270 23F - 50 UOP - 0.8cc/hr in the last 24 hours  Intake/Output this shift: Total I/O In: 157.8 [I.V.:132.7; IV Piggyback:25.1] Out: -   Vent Mode: PRVC FiO2 (%):  [40 %-50 %] 40 % Set Rate:  [16 bmp] 16 bmp Vt Set:  [560 mL] 560 mL PEEP:  [5 cmH20-8 cmH20] 5 cmH20 Plateau Pressure:  [15 cmH20-16 cmH20] 16 cmH20   PE: Gen:  On vent Card:  Reg Pulm:  On vent Abd: Distended but soft, no rigidity or guarding. Hypoactive bowel sounds. NGT in place w/ bilious output in cannister. RUQ drains x2 with cloudy bloody/brown output. Midline wound clean without dehiscence.   Lab Results:  Recent Labs    10/24/20 0349 10/24/20 1154 10/25/20 0328  WBC 33.7*  --  25.7*  HGB 7.9* 8.2* 8.0*  HCT 23.7* 24.0* 24.1*  PLT 205  --  197   BMET Recent Labs    10/24/20 0349 10/24/20 1154 10/25/20 0328  NA 128* 128* 132*  K 4.7 4.7 4.5  CL 92*  --  97*  CO2 24  --  25  GLUCOSE 285*  --  199*  BUN 51*  --  51*  CREATININE 2.18*  --  1.76*  CALCIUM 7.8*  --  8.0*   PT/INR No results for input(s): LABPROT, INR in the last 72 hours. CMP     Component Value Date/Time   NA 132 (L) 10/25/2020 0328   K 4.5 10/25/2020 0328   CL 97 (L) 10/25/2020 0328   CO2 25  10/25/2020 0328   GLUCOSE 199 (H) 10/25/2020 0328   BUN 51 (H) 10/25/2020 0328   CREATININE 1.76 (H) 10/25/2020 0328   CALCIUM 8.0 (L) 10/25/2020 0328   PROT 5.3 (L) 10/25/2020 0328   ALBUMIN 1.7 (L) 10/25/2020 0328   AST 52 (H) 10/25/2020 0328   ALT 33 10/25/2020 0328   ALKPHOS 113 10/25/2020 0328   BILITOT 1.0 10/25/2020 0328   GFRNONAA 46 (L) 10/25/2020 0328   Lipase     Component Value Date/Time   LIPASE 25 10/20/2020 1506    Studies/Results: No results found.  Anti-infectives: Anti-infectives (From admission, onward)    Start     Dose/Rate Route Frequency Ordered Stop   10/25/20 2300  fluconazole (DIFLUCAN) IVPB 400 mg        400 mg 100 mL/hr over 120 Minutes Intravenous Every 24 hours 10/25/20 0742     10/24/20 2300  fluconazole (DIFLUCAN) IVPB 200 mg  Status:  Discontinued        200 mg 100 mL/hr over 60 Minutes Intravenous Every 24 hours 10/24/20 0943 10/25/20 0742   10/22/20 1800  vancomycin (VANCOREADY) IVPB 1500 mg/300 mL  Status:  Discontinued  1,500 mg 150 mL/hr over 120 Minutes Intravenous Every 24 hours 11/01/2020 2225 10/22/20 0759   10/22/20 0900  linezolid (ZYVOX) IVPB 600 mg        600 mg 300 mL/hr over 60 Minutes Intravenous Every 12 hours 10/22/20 0800     10/22/20 0300  piperacillin-tazobactam (ZOSYN) IVPB 3.375 g        3.375 g 12.5 mL/hr over 240 Minutes Intravenous Every 8 hours 10/28/2020 2225     10/16/2020 2335  metroNIDAZOLE (FLAGYL) IVPB 500 mg  Status:  Discontinued        500 mg 100 mL/hr over 60 Minutes Intravenous Every 12 hours 11/09/2020 2142 10/22/20 0747   10/24/2020 2248  fluconazole (DIFLUCAN) IVPB 400 mg  Status:  Discontinued        400 mg 100 mL/hr over 120 Minutes Intravenous Every 24 hours 11/07/2020 2142 10/24/20 0943   11/07/2020 1748  vancomycin (VANCOREADY) IVPB 2000 mg/400 mL        2,000 mg 200 mL/hr over 120 Minutes Intravenous  Once 10/29/2020 1706 10/29/2020 2113   11/03/2020 1715  piperacillin-tazobactam (ZOSYN) IVPB 3.375 g         3.375 g 100 mL/hr over 30 Minutes Intravenous  Once 10/16/2020 1706 10/20/2020 1903        Assessment/Plan POD 3 s/p ex lap with pancreatic debridement for infected necrotic pancreatitis by Dr. Sheliah Hatch on 9/9 - WBC down from 33.7 > 25.7. Cont abx - Superior drain under the mesocolon along with the head of the pancreas - Inferior drain under the mesocolon along the left upper abdomen and what is likely a position inferior to the pancreas - Continue drains - We do not recommend further surgery at this time. Continue medical management/supportive care per CCM and greatly appreciate their help   FEN: NGT (please keep NGT if extubated), considering starting TPN vs post pyloric tube feeds in the next 24-48 hours  ID: linezolid, zosyn, fluconazole VTE: heparin subq Foley - In place for I/O monitoring  VDRF - appreciate ccm's assistance  Abl anemia - stable at 8.0 ETOH use AKI - improving T2DM   LOS: 4 days    Jacinto Halim , [facility] 10/25/2020, 9:45 AM Please see Amion for pager number during day hours 7:00am-4:30pm

## 2020-10-25 NOTE — Progress Notes (Signed)
Peripherally Inserted Central Catheter Placement  The IV Nurse has discussed with the patient and/or persons authorized to consent for the patient, the purpose of this procedure and the potential benefits and risks involved with this procedure.  The benefits include less needle sticks, lab draws from the catheter, and the patient may be discharged home with the catheter. Risks include, but not limited to, infection, bleeding, blood clot (thrombus formation), and puncture of an artery; nerve damage and irregular heartbeat and possibility to perform a PICC exchange if needed/ordered by physician.  Alternatives to this procedure were also discussed.  Bard Power PICC patient education guide, fact sheet on infection prevention and patient information card has been provided to patient /or left at bedside.    PICC Placement Documentation  PICC Triple Lumen 10/25/20 PICC Right Basilic 39 cm 0 cm (Active)  Indication for Insertion or Continuance of Line Administration of hyperosmolar/irritating solutions (i.e. TPN, Vancomycin, etc.) 10/25/20 1831  Exposed Catheter (cm) 0 cm 10/25/20 1831  Site Assessment Clean;Dry;Intact 10/25/20 1831  Lumen #1 Status Flushed;Saline locked;Blood return noted 10/25/20 1831  Lumen #3 Status Flushed;Saline locked;Blood return noted 10/25/20 1831  Dressing Type Transparent;Securing device 10/25/20 1831  Dressing Status Clean;Dry;Intact 10/25/20 1831  Antimicrobial disc in place? Yes 10/25/20 1831  Safety Lock Not Applicable 10/25/20 1831  Line Adjustment (NICU/IV Team Only) No 10/25/20 1831  Dressing Intervention New dressing;Other (Comment) 10/25/20 1831  Dressing Change Due 11/01/20 10/25/20 1831       Annett Fabian 10/25/2020, 6:35 PM

## 2020-10-25 NOTE — Progress Notes (Signed)
PHARMACY - TOTAL PARENTERAL NUTRITION CONSULT NOTE   Indication:  severe pancreatitis  Patient Measurements: Height: 5\' 9"  (175.3 cm) Weight: 102.2 kg (225 lb 5 oz) IBW/kg (Calculated) : 70.7 TPN AdjBW (KG): 78 Body mass index is 33.27 kg/m.  Assessment: 79 yom presenting 9/8 with septic shock due to necrotizing pancreatitis with pneumoperitoneum s/p ex-lap 9/9. Pt transferred to ICU post-op, intubated and sedated. Pharmacy consulted to start TPN for severe pancreatitis. Pt at risk of refeeding with minimal PO intake x 1 week PTA, decreased PO intake x 1.5 months PTA with significant weight loss and hx heavy alcohol abuse.  Patient intubated, currently sedated on fentanyl drip only, propofol d/c'd 9/9). Transitioned off vasopressors again 9/10.   Glucose / Insulin: A1c 6.8 (no meds pta). CBGs uncontrolled due to stress dose steroids but improving (steroids stopped 9/11), 198-271. Utilized 47 units mSSI in last 24hrs CCM starting Semglee 8 units BID on 9/11 Electrolytes: Na up to 132 (124 on admit), K down to 4.5 (none in TPN), Cl low stable 97, Phos down to 4 (none in TPN), Mag 2.4, others WNL Renal: AKI - SCr 2.18>1.76 (1.19 on admit), BUN up to 51 Hepatic: AST trend down to 52, ALT / Tbili normalized, TG WNL, albumin 2; Trigly 97 Intake / Output; MIVF: NGT output 316ml/24hrs, drain output 328ml/24hrs, UOP 0.53ml/kg/hr; MIVF d/c'd 9/10. Net +7.2L this admit GI Imaging: 9/8 CT A/P - extensive pneumoperitoneum GI Surgeries / Procedures: 9/9 ex-lap (no colonic perforation, large necrotic fluid present surrounding pancreas)  Central access: CVC 9/9 TPN start date: 9/10  Nutritional Goals: Goal TPN rate is 75 mL/hr (provides 126 g of protein and 2192 kcals per day)  RD Assessment: Estimated Needs Total Energy Estimated Needs: 2200-2400 Total Protein Estimated Needs: 125-145 grams Total Fluid Estimated Needs: >/= 2.0 L  Current Nutrition:  NPO and TPN  Plan:  Increase TPN to  67mL/hr at 1800  Electrolytes in TPN: increase Na to 37mEq/L, remove K/Phos, Ca 26mEq/L, Mg 10mEq/L. Cl:Ac to 1:1 Add standard MVI and trace elements + folic acid 1mg  and thiamine 100mg  to TPN Continue Resistant q4h SSI + CCM adding Increase Semglee to 10 units BID today - monitor CBGs closely with stress dose steroids d/c'd 9/11 and adjust regimen as needed.  Monitor TPN labs F/u Surgery plans - no need for further surgery at this time per 9/11 note   , PharmD, [facility] Clinical Pharmacist Please see AMION for all Pharmacists' Contact Phone Numbers 10/25/2020, 7:30 AM

## 2020-10-25 NOTE — Progress Notes (Signed)
Pt sedation weaned per [facility] protocol. Became agitated and removed NGT despite mittens. Increased precedex and initiated bilateral wrist restraints per MD. Will replaced NGT and obtain KUB.

## 2020-10-25 NOTE — Progress Notes (Signed)
NAME:  Paul Chambers, MRN:  [id number], DOB:  [birth year], LOS: 4 ADMISSION DATE:  10/25/2020, CONSULTATION DATE:  11/05/2020 REFERRING MD:  EDP CHIEF COMPLAINT:  Abdominal Pain   History of Present Illness:  This 55 y.o. Caucasian male smoker presented to the [facility] Emergency Department via private vehicle with complaints of abdominal pain and altered mental status. At the time of clinical interview, the patient is quite somnolent, snoring loudly. He is maintaining his airway but not able to contribute meaningfully to clinical interview.  The patient's wife reports that this is the third presentation to a healthcare facility in the past month for abdominal pain ([facility], [facility] and now [facility]).  The patient's deteriorating mental status over the past 2 days prompted the wife to force the patient to come to the hospital.  In the ER tonight, CT head was negative for acute process, but CT abdomen showed an impressive pneumoperitoneum without an obvious location of perforation.  Surgery has already evaluated the patient and decided to reocmmend proceeding to surgery; the patient's wife provided consent.  Significant Hospital Events: Including procedures, antibiotic start and stop dates in addition to other pertinent events   9/8 - Presented to [facility] with abdominal pain. CT A/P with extensive pneumoperitoneum 9/9 - Ex-lap (no colonic perforation discovered, large necrotic fluid present surrounding pancreas. Transferred to ICU post-op, intubated and sedated.  9/10-received 1 unit PRBC for hemoglobin 6.7, went back on Levophed for short period of time  Interim History / Subjective:   No overnight events. This AM, patient is sedated on Fentanyl and Precedex.   Objective   Blood pressure 116/73, pulse 66, temperature 98.3 F (36.8 C), temperature source Oral, resp. rate 16, height 5\' 9"  (1.753 m), weight 102.2 kg, SpO2 96 %.    Vent Mode: PRVC FiO2 (%):  [40 %-60 %] 40  % Set Rate:  [16 bmp] 16 bmp Vt Set:  [560 mL] 560 mL PEEP:  [8 cmH20] 8 cmH20 Plateau Pressure:  [15 cmH20-16 cmH20] 16 cmH20   Intake/Output Summary (Last 24 hours) at 10/25/2020 0731 Last data filed at 10/25/2020 0600 Gross per 24 hour  Intake 3275.22 ml  Output 2650 ml  Net 625.22 ml    Filed Weights   10/23/20 0200 10/24/20 0403 10/25/20 0402  Weight: 98.2 kg 100.6 kg 102.2 kg   Examination:   Physical exam: General: Crtitically ill-appearing middle-aged obese Caucasian male, orally intubated HEENT: [place]/AT, eyes anicteric.  ETT and OGT in place Neuro: Sedated at this time.  Chest: Coarse breath sounds, no wheezes or rhonchi Heart: Regular rate and rhythm, no murmurs or gallops Abdomen: Distended with absent bowel sounds.  2 drains in place in the RUQ with bloody output Skin: No rash  Resolved Hospital Problem list   Septic shock  Assessment & Plan:   Infected Necrotizing Pancreatitis with Pneumoperitoneum s/p ex-lap Initially presented with septic shock. He has remained afebrile, off vasopressors for 48 hours with improving WBC. Blood cultures have remained negative.   - General surgery following; appreciate their recommendations.  - Continue IV antibiotics with Linezolid, Zosyn and fluconazole - Continue TPN  Acute respiratory failure with hypoxia:  Likely due to aspiration pneumonia.   - Continue lung protective ventilation - Aspiration precautions - Will wean sedatives for potential extubation today  Acute toxic-metabolic encephalopathy:  Secondary to sepsis. Resolving.   - Minimize sedation  Acute kidney injury: Due to septic ATN Acute alcoholic hepatitis Serum creatinine improving today with 1.2 L UOP  overnight. LFTs stable.   - Avoid nephrotoxic agents - Continue trending renal function   Alcohol abuse:  - Precedex PRN for agitation - CIWA on extubation - Continue thiamine and folate  Acute Blood Loss Anemia, postsurgical S/p 1 unit of pRBCs  on 9/10 with stabilization in hemoglobin since.   - Monitor H&H and transfuse if less than 7  Hyponatremia: In the setting of critical illness. Possibly an aspect of beer potomania. Stable at this time.   - Monitor serum sodium daily   Severe protein calorie malnutrition - Continue TPN  Diabetes type 2 with hyperglycemia Hemoglobin A1c was 6.8% on admission. He remains hyperglycemic.   - Increase Lantus to 10 units BID  - Continue SSI (resistant), with CBG goal 140-180  Best Practice (right click and "Reselect all SmartList Selections" daily)   Diet/type: NPO with oral meds.  TPN DVT prophylaxis: prophylactic heparin  GI prophylaxis: PPI Lines: Central line and yes and it is still needed Foley:  Yes, and it is still needed Code Status:  full code Last date of multidisciplinary goals of care discussion [9/12, patient's wife was updated at bedside]  Labs   CBC: Recent Labs  Lab 10/22/20 1753 10/23/20 0425 10/23/20 1210 10/24/20 0349 10/24/20 1154 10/25/20 0328  WBC 39.2* 33.5* 35.7* 33.7*  --  25.7*  HGB 7.7* 6.7* 7.5* 7.9* 8.2* 8.0*  HCT 22.9* 20.5* 22.3* 23.7* 24.0* 24.1*  MCV 107.5* 109.0* 106.7* 106.8*  --  106.6*  PLT 250 224 245 205  --  197    Basic Metabolic Panel: Recent Labs  Lab 10/17/2020 2213 10/20/2020 2326 10/22/20 0357 10/22/20 0826 10/22/20 1753 10/23/20 0425 10/24/20 0349 10/24/20 1154 10/25/20 0328  NA 124*   < > 126* 125* 126* 127* 128* 128* 132*  K  --    < > 5.0 5.0 5.0 5.0 4.7 4.7 4.5  CL  --   --  89* 90* 90* 92* 92*  --  97*  CO2  --   --  23 21* 24 25 24   --  25  GLUCOSE  --   --  205* 174* 185* 206* 285*  --  199*  BUN  --   --  30* 33* 34* 40* 51*  --  51*  CREATININE  --   --  1.29* 1.86* 1.92* 2.07* 2.18*  --  1.76*  CALCIUM  --   --  8.4* 7.8* 7.8* 7.7* 7.8*  --  8.0*  MG 2.3  --  2.2  --   --  2.4 2.6*  --  2.4  PHOS 4.6  --  6.4*  --   --  7.7* 6.3*  --  4.0   < > = values in this interval not displayed.     GFR: Estimated Creatinine Clearance: 57.8 mL/min (A) (by C-G formula based on SCr of 1.76 mg/dL (H)). Recent Labs  Lab 11/04/2020 2213 10/22/20 0357 10/22/20 12/22/20 10/22/20 0826 10/22/20 1304 10/22/20 1753 10/23/20 0425 10/23/20 0910 10/23/20 1210 10/24/20 0349 10/25/20 0328  PROCALCITON 16.09 21.00  --   --   --   --   --   --   --   --   --   WBC  --  40.9*  --  53.9*  --    < > 33.5*  --  35.7* 33.7* 25.7*  LATICACIDVEN 2.0*  --  3.8* 4.9* 3.5*  --   --  1.8  --   --   --    < > =  values in this interval not displayed.    Liver Function Tests: Recent Labs  Lab 11/01/2020 1506 10/22/20 0826 10/23/20 0425 10/24/20 0349 10/25/20 0328  AST 76* 75* 57* 56* 52*  ALT 53* 43 34 36 33  ALKPHOS 117 122 85 112 113  BILITOT 1.4* 1.6* 1.3* 1.2 1.0  PROT 6.1* 5.0* 4.9* 5.2* 5.3*  ALBUMIN 1.7* 2.0* 2.4* 2.0* 1.7*    Recent Labs  Lab 10/31/2020 1506  LIPASE 25    Recent Labs  Lab 11/09/2020 2213  AMMONIA 37*    ABG    Component Value Date/Time   PHART 7.368 10/24/2020 1154   PCO2ART 46.2 10/24/2020 1154   PO2ART 71 (L) 10/24/2020 1154   HCO3 26.6 10/24/2020 1154   TCO2 28 10/24/2020 1154   O2SAT 93.0 10/24/2020 1154   Coagulation Profile: Recent Labs  Lab 11/05/2020 2102 10/22/20 0357 10/22/20 0826  INR 2.1* 2.1* 2.0*    Cardiac Enzymes: Recent Labs  Lab 10/19/2020 1550  CKTOTAL 27*    HbA1C: Hgb A1c MFr Bld  Date/Time Value Ref Range Status  11/05/2020 10:13 PM 6.8 (H) 4.8 - 5.6 % Final    Comment:    (NOTE) Pre diabetes:          5.7%-6.4%  Diabetes:              >6.4%  Glycemic control for   <7.0% adults with diabetes    CBG: Recent Labs  Lab 10/24/20 1142 10/24/20 1608 10/24/20 2004 10/24/20 2354 10/25/20 0326  GLUCAP 271* 255* 240* 250* 198*    Dr. Verdene Lennert Internal Medicine PGY-2  10/25/2020, 8:10 AM

## 2020-10-25 NOTE — Addendum Note (Signed)
Addendum  created 10/25/20 0943 by Val Eagle, MD   Clinical Note Signed, Intraprocedure Blocks edited, SmartForm saved

## 2020-10-25 NOTE — Procedures (Signed)
Extubation Procedure Note  Patient Details:   Name: Paul Chambers DOB: 1965-02-17 MRN: [id number]   Airway Documentation:    Vent end date: 10/25/20 Vent end time: 1250   Evaluation  O2 sats: stable throughout Complications: No apparent complications Patient did tolerate procedure well. Bilateral Breath Sounds: Diminished   Yes  Pt extubated per physician order. Pt suctioned orally and via ETT. Positive cuff leak. Pt extubated to 4L nasal cannula. Pt able to give a fair cough and no stridor heard at this time. Pt not speaking to staff, MD aware.   Trilby Leaver Jaston Havens 10/25/2020, 1:00 PM

## 2020-10-26 ENCOUNTER — Inpatient Hospital Stay (HOSPITAL_COMMUNITY): Payer: BC Managed Care – PPO

## 2020-10-26 DIAGNOSIS — A419 Sepsis, unspecified organism: Secondary | ICD-10-CM | POA: Diagnosis not present

## 2020-10-26 DIAGNOSIS — R6520 Severe sepsis without septic shock: Secondary | ICD-10-CM | POA: Diagnosis not present

## 2020-10-26 LAB — BASIC METABOLIC PANEL
Anion gap: 11 (ref 5–15)
BUN: 27 mg/dL — ABNORMAL HIGH (ref 6–20)
CO2: 24 mmol/L (ref 22–32)
Calcium: 8.3 mg/dL — ABNORMAL LOW (ref 8.9–10.3)
Chloride: 101 mmol/L (ref 98–111)
Creatinine, Ser: 0.9 mg/dL (ref 0.61–1.24)
GFR, Estimated: >60 mL/min (ref >60)
Glucose, Bld: 223 mg/dL — ABNORMAL HIGH (ref 70–99)
Potassium: 3.5 mmol/L (ref 3.5–5.1)
Sodium: 136 mmol/L (ref 135–145)

## 2020-10-26 LAB — TYPE AND SCREEN
ABO/RH(D): A POS
Antibody Screen: NEGATIVE
Unit division: 0
Unit division: 0

## 2020-10-26 LAB — COMPREHENSIVE METABOLIC PANEL
ALT: 46 U/L — ABNORMAL HIGH (ref 0–44)
AST: 84 U/L — ABNORMAL HIGH (ref 15–41)
Albumin: 1.5 g/dL — ABNORMAL LOW (ref 3.5–5.0)
Alkaline Phosphatase: 190 U/L — ABNORMAL HIGH (ref 38–126)
Anion gap: 9 (ref 5–15)
BUN: 37 mg/dL — ABNORMAL HIGH (ref 6–20)
CO2: 25 mmol/L (ref 22–32)
Calcium: 8.1 mg/dL — ABNORMAL LOW (ref 8.9–10.3)
Chloride: 101 mmol/L (ref 98–111)
Creatinine, Ser: 1.12 mg/dL (ref 0.61–1.24)
GFR, Estimated: >60 mL/min (ref >60)
Glucose, Bld: 123 mg/dL — ABNORMAL HIGH (ref 70–99)
Potassium: 3.5 mmol/L (ref 3.5–5.1)
Sodium: 135 mmol/L (ref 135–145)
Total Bilirubin: 1 mg/dL (ref 0.3–1.2)
Total Protein: 5.3 g/dL — ABNORMAL LOW (ref 6.5–8.1)

## 2020-10-26 LAB — CBC WITH DIFFERENTIAL/PLATELET
Abs Immature Granulocytes: 0.22 10*3/uL — ABNORMAL HIGH (ref 0.00–0.07)
Basophils Absolute: 0 10*3/uL (ref 0.0–0.1)
Basophils Relative: 0 %
Eosinophils Absolute: 0 10*3/uL (ref 0.0–0.5)
Eosinophils Relative: 0 %
HCT: 20.4 % — ABNORMAL LOW (ref 39.0–52.0)
Hemoglobin: 6.5 g/dL — CL (ref 13.0–17.0)
Immature Granulocytes: 1 %
Lymphocytes Relative: 5 %
Lymphs Abs: 0.8 10*3/uL (ref 0.7–4.0)
MCH: 36.1 pg — ABNORMAL HIGH (ref 26.0–34.0)
MCHC: 31.9 g/dL (ref 30.0–36.0)
MCV: 113.3 fL — ABNORMAL HIGH (ref 80.0–100.0)
Monocytes Absolute: 0.6 10*3/uL (ref 0.1–1.0)
Monocytes Relative: 4 %
Neutro Abs: 15 10*3/uL — ABNORMAL HIGH (ref 1.7–7.7)
Neutrophils Relative %: 90 %
Platelets: 150 10*3/uL (ref 150–400)
RBC: 1.8 MIL/uL — ABNORMAL LOW (ref 4.22–5.81)
RDW: 16.7 % — ABNORMAL HIGH (ref 11.5–15.5)
Smear Review: NORMAL
WBC: 16.7 10*3/uL — ABNORMAL HIGH (ref 4.0–10.5)
nRBC: 0.2 % (ref 0.0–0.2)

## 2020-10-26 LAB — CBC
HCT: 22.3 % — ABNORMAL LOW (ref 39.0–52.0)
HCT: 28.3 % — ABNORMAL LOW (ref 39.0–52.0)
Hemoglobin: 6.5 g/dL — CL (ref 13.0–17.0)
Hemoglobin: 9.2 g/dL — ABNORMAL LOW (ref 13.0–17.0)
MCH: 37.8 pg — ABNORMAL HIGH (ref 26.0–34.0)
MCH: 33.8 pg (ref 26.0–34.0)
MCHC: 29.1 g/dL — ABNORMAL LOW (ref 30.0–36.0)
MCHC: 32.5 g/dL (ref 30.0–36.0)
MCV: 129.7 fL — ABNORMAL HIGH (ref 80.0–100.0)
MCV: 104 fL — ABNORMAL HIGH (ref 80.0–100.0)
Platelets: 139 10*3/uL — ABNORMAL LOW (ref 150–400)
Platelets: 169 10*3/uL (ref 150–400)
RBC: 1.72 MIL/uL — ABNORMAL LOW (ref 4.22–5.81)
RBC: 2.72 MIL/uL — ABNORMAL LOW (ref 4.22–5.81)
RDW: 17.8 % — ABNORMAL HIGH (ref 11.5–15.5)
RDW: 19.5 % — ABNORMAL HIGH (ref 11.5–15.5)
WBC: 15.4 10*3/uL — ABNORMAL HIGH (ref 4.0–10.5)
WBC: 17.8 10*3/uL — ABNORMAL HIGH (ref 4.0–10.5)
nRBC: 0.3 % — ABNORMAL HIGH (ref 0.0–0.2)
nRBC: 0 % (ref 0.0–0.2)

## 2020-10-26 LAB — BPAM RBC
Blood Product Expiration Date: 202210052359
Blood Product Expiration Date: 202210052359
ISSUE DATE / TIME: 202209090106
ISSUE DATE / TIME: 202209100639
Unit Type and Rh: 6200
Unit Type and Rh: 6200

## 2020-10-26 LAB — GLUCOSE, CAPILLARY
Glucose-Capillary: 120 mg/dL — ABNORMAL HIGH (ref 70–99)
Glucose-Capillary: 154 mg/dL — ABNORMAL HIGH (ref 70–99)
Glucose-Capillary: 227 mg/dL — ABNORMAL HIGH (ref 70–99)
Glucose-Capillary: 254 mg/dL — ABNORMAL HIGH (ref 70–99)
Glucose-Capillary: 235 mg/dL — ABNORMAL HIGH (ref 70–99)
Glucose-Capillary: 251 mg/dL — ABNORMAL HIGH (ref 70–99)

## 2020-10-26 LAB — CULTURE, BLOOD (ROUTINE X 2)
Culture: NO GROWTH
Special Requests: ADEQUATE

## 2020-10-26 LAB — PHOSPHORUS: Phosphorus: 2.3 mg/dL — ABNORMAL LOW (ref 2.5–4.6)

## 2020-10-26 LAB — MAGNESIUM: Magnesium: 2.2 mg/dL (ref 1.7–2.4)

## 2020-10-26 LAB — PREPARE RBC (CROSSMATCH)

## 2020-10-26 MED ORDER — POTASSIUM PHOSPHATES 15 MMOLE/5ML IV SOLN
15.0000 mmol | Freq: Once | INTRAVENOUS | Status: AC
  Administered 2020-10-26: 15 mmol via INTRAVENOUS
  Filled 2020-10-26: qty 5

## 2020-10-26 MED ORDER — FENTANYL CITRATE (PF) 100 MCG/2ML IJ SOLN
50.0000 ug | INTRAMUSCULAR | Status: DC | PRN
  Administered 2020-10-26: 50 ug via INTRAVENOUS
  Filled 2020-10-26: qty 2

## 2020-10-26 MED ORDER — FENTANYL CITRATE (PF) 100 MCG/2ML IJ SOLN
50.0000 ug | INTRAMUSCULAR | Status: DC | PRN
  Administered 2020-10-26 (×8): 100 ug via INTRAVENOUS
  Filled 2020-10-26 (×8): qty 2

## 2020-10-26 MED ORDER — INSULIN GLARGINE-YFGN 100 UNIT/ML ~~LOC~~ SOLN
5.0000 [IU] | Freq: Two times a day (BID) | SUBCUTANEOUS | Status: DC
  Administered 2020-10-26: 5 [IU] via SUBCUTANEOUS
  Filled 2020-10-26 (×3): qty 0.05

## 2020-10-26 MED ORDER — SODIUM CHLORIDE 0.9% IV SOLUTION: Freq: Once | INTRAVENOUS | Status: AC

## 2020-10-26 MED ORDER — POTASSIUM CHLORIDE 10 MEQ/50ML IV SOLN
10.0000 meq | INTRAVENOUS | Status: AC
  Administered 2020-10-26 (×4): 10 meq via INTRAVENOUS
  Filled 2020-10-26 (×4): qty 50

## 2020-10-26 MED ORDER — DIAZEPAM 5 MG/ML IJ SOLN
2.5000 mg | INTRAMUSCULAR | Status: DC | PRN
  Administered 2020-10-26 – 2020-11-03 (×7): 2.5 mg via INTRAVENOUS
  Filled 2020-10-26 (×7): qty 2

## 2020-10-26 MED ORDER — STERILE WATER FOR INJECTION IV SOLN
INTRAVENOUS | Status: AC
  Filled 2020-10-26: qty 840

## 2020-10-26 NOTE — Progress Notes (Signed)
5 Days Post-Op  Subjective: CC: Patient extubated yesterday. He was on precedex this am, now off. He is alert and orientated to self, time and that he is in the hospital. He reports generalized abdominal pain and distension. No flatus or bm. Foley in place. Afebrile. Wbc down.  Objective: Vital signs in last 24 hours: Temp:  [98.3 F (36.8 C)-99.3 F (37.4 C)] 99.3 F (37.4 C) (09/13 0400) Pulse Rate:  [64-100] 75 (09/13 0700) Resp:  [10-32] 23 (09/13 0700) BP: (105-127)/(58-73) 120/63 (09/13 0700) SpO2:  [90 %-99 %] 98 % (09/13 0700) Arterial Line BP: (97-142)/(44-94) 115/44 (09/12 1745) FiO2 (%):  [40 %-55 %] 55 % (09/12 2308) Last BM Date:  (PTA)  Intake/Output from previous day: 09/12 0701 - 09/13 0700 In: 3058.9 [I.V.:1987.3; NG/GT:150; IV Piggyback:921.6] Out: 3077 [Urine:2197; Emesis/NG output:500; Drains:380] Intake/Output this shift: No intake/output data recorded.  PE: Gen:  Awake and alert, nad Card:  Reg Pulm:  Normal rate and effort  Abd: Distended but soft, generalized tenderness without rigidity or guarding. Hypoactive bowel sounds. NGT in place thin brown output in cannister. RUQ drains x2 with cloudy bloody/brown output. Midline wound clean without dehiscence.  65F - 310 29F - 70 UOP - 0.9cc/hr in the last 24 hours  Lab Results:  Recent Labs    10/25/20 0328 10/26/20 0808  WBC 25.7* 16.7*  HGB 8.0* 6.5*  HCT 24.1* 20.4*  PLT 197 150   BMET Recent Labs    10/25/20 0328 10/26/20 0401  NA 132* 135  K 4.5 3.5  CL 97* 101  CO2 25 25  GLUCOSE 199* 123*  BUN 51* 37*  CREATININE 1.76* 1.12  CALCIUM 8.0* 8.1*   PT/INR No results for input(s): LABPROT, INR in the last 72 hours. CMP     Component Value Date/Time   NA 135 10/26/2020 0401   K 3.5 10/26/2020 0401   CL 101 10/26/2020 0401   CO2 25 10/26/2020 0401   GLUCOSE 123 (H) 10/26/2020 0401   BUN 37 (H) 10/26/2020 0401   CREATININE 1.12 10/26/2020 0401   CALCIUM 8.1 (L) 10/26/2020  0401   PROT 5.3 (L) 10/26/2020 0401   ALBUMIN 1.5 (L) 10/26/2020 0401   AST 84 (H) 10/26/2020 0401   ALT 46 (H) 10/26/2020 0401   ALKPHOS 190 (H) 10/26/2020 0401   BILITOT 1.0 10/26/2020 0401   GFRNONAA >60 10/26/2020 0401   Lipase     Component Value Date/Time   LIPASE 25 10/16/2020 1506    Studies/Results: DG Abd 1 View  Result Date: 10/25/2020 CLINICAL DATA:  Insert tube placement EXAM: ABDOMEN - 1 VIEW COMPARISON:  10/31/2020 CT abdomen/pelvis FINDINGS: Enteric tube terminates in the proximal stomach with side port in the proximal stomach. Superior approach central venous catheter terminates at the cavoatrial junction. Surgical drains terminate in the medial upper abdomen bilaterally. Small amount of scattered retroperitoneal gas noted in the upper left abdomen as seen on prior CT. Small left pleural effusion. IMPRESSION: Enteric tube terminates in the proximal stomach. Electronically Signed   By: Delbert Phenix M.D.   On: 10/25/2020 13:53   [place] EKG SITE RITE  Result Date: 10/25/2020 If Site Rite image not attached, placement could not be confirmed due to current cardiac rhythm.   Anti-infectives: Anti-infectives (From admission, onward)    Start     Dose/Rate Route Frequency Ordered Stop   10/25/20 2300  fluconazole (DIFLUCAN) IVPB 400 mg        400 mg 100 mL/hr  over 120 Minutes Intravenous Every 24 hours 10/25/20 0742 10/29/20 2259   10/24/20 2300  fluconazole (DIFLUCAN) IVPB 200 mg  Status:  Discontinued        200 mg 100 mL/hr over 60 Minutes Intravenous Every 24 hours 10/24/20 0943 10/25/20 0742   10/22/20 1800  vancomycin (VANCOREADY) IVPB 1500 mg/300 mL  Status:  Discontinued        1,500 mg 150 mL/hr over 120 Minutes Intravenous Every 24 hours 10/26/2020 2225 10/22/20 0759   10/22/20 0900  linezolid (ZYVOX) IVPB 600 mg        600 mg 300 mL/hr over 60 Minutes Intravenous Every 12 hours 10/22/20 0800 10/29/20 0959   10/22/20 0300  piperacillin-tazobactam (ZOSYN) IVPB  3.375 g        3.375 g 12.5 mL/hr over 240 Minutes Intravenous Every 8 hours 10/26/2020 2225 10/29/20 0559   10/26/2020 2335  metroNIDAZOLE (FLAGYL) IVPB 500 mg  Status:  Discontinued        500 mg 100 mL/hr over 60 Minutes Intravenous Every 12 hours 10/26/20 2142 10/22/20 0747   10/26/2020 2248  fluconazole (DIFLUCAN) IVPB 400 mg  Status:  Discontinued        400 mg 100 mL/hr over 120 Minutes Intravenous Every 24 hours 10/26/2020 2142 10/24/20 0943   10/26/20 1748  vancomycin (VANCOREADY) IVPB 2000 mg/400 mL        2,000 mg 200 mL/hr over 120 Minutes Intravenous  Once 10-26-20 1706 10-26-20 2113   10/26/20 1715  piperacillin-tazobactam (ZOSYN) IVPB 3.375 g        3.375 g 100 mL/hr over 30 Minutes Intravenous  Once 10/26/2020 1706 10-26-20 1903        Assessment/Plan POD 4 s/p ex lap with pancreatic debridement for infected necrotic pancreatitis by Dr. Sheliah Hatch on 9/9 - WBC down from 33.7 > 25.7 > 16.7, afebrile. Cont abx - Cont drains  - Superior drain under the mesocolon along with the head of the pancreas - Inferior drain under the mesocolon along the left upper abdomen and what is likely a position inferior to the pancreas - Ileus. TPN for nutrition - We do not recommend further surgery at this time. Continue medical management/supportive care per CCM and greatly appreciate their help   FEN: NGT, TPN ID: linezolid, zosyn, fluconazole VTE: heparin subq Foley - Consider TOV   - Per primary -  VDRF - now extubated Abl anemia - hgb 6.5. Consider prbc ETOH use - weaned off precedex this am AKI - improving T2DM   LOS: 5 days    Jacinto Halim , [facility] 10/26/2020, 10:41 AM Please see Amion for pager number during day hours 7:00am-4:30pm

## 2020-10-26 NOTE — Progress Notes (Addendum)
PHARMACY - TOTAL PARENTERAL NUTRITION CONSULT NOTE   Indication:  severe pancreatitis  Patient Measurements: Height: 5\' 9"  (175.3 cm) Weight: 102.2 kg (225 lb 5 oz) IBW/kg (Calculated) : 70.7 TPN AdjBW (KG): 78 Body mass index is 33.27 kg/m.  Assessment: 37 yom presenting 9/8 with septic shock due to necrotizing pancreatitis with pneumoperitoneum s/p ex-lap 9/9. Pt transferred to ICU post-op, intubated and sedated. Pharmacy consulted to start TPN for severe pancreatitis. Pt at risk of refeeding with minimal PO intake x 1 week PTA, decreased PO intake x 1.5 months PTA with significant weight loss and hx heavy alcohol abuse.  Patient intubated, currently sedated on fentanyl drip only, propofol d/c'd 9/9). Transitioned off vasopressors again 9/10.   Glucose / Insulin: A1c 6.8 (no meds pta). CBGs uncontrolled due to stress dose steroids but improving (steroids stopped 9/11), 117- 154. Utilized 8 units mSSI in last 24hrs Semglee 10 units BID on 9/12 Electrolytes: Na up to 135 (124 on admit), K down to 3.5 (none in TPN), Cl 101, Phos down to 2.3 (none in TPN), Mag 2.2, others WNL Renal: AKI - SCr 2.18>1.76>1.12 (1.19 on admit), BUN 37 Hepatic: AST 84, ALT / Tbili normalized, TG WNL, albumin 1.5; Trigly 97 Intake / Output; MIVF: NGT output 537ml/24hrs, drain output 36ml/24hrs, UOP 0.20ml/kg/hr GI Imaging: 9/8 CT A/P - extensive pneumoperitoneum GI Surgeries / Procedures: 9/9 ex-lap (no colonic perforation, large necrotic fluid present surrounding pancreas)  Central access: CVC 9/9 TPN start date: 9/10  Nutritional Goals: Goal TPN rate is 75 mL/hr (provides 126 g of protein and 2192 kcals per day)  RD Assessment: Estimated Needs Total Energy Estimated Needs: 2200-2400 Total Protein Estimated Needs: 125-145 grams Total Fluid Estimated Needs: >/= 2.0 L  Current Nutrition:  NPO and TPN  Plan:  Continue TPN at 42mL/hr at 1800  Electrolytes in TPN: change Na to 7mEq/L, add K 15 meq/L;  Phos 3 meq/L, Ca 35mEq/L, Mg 71mEq/L. Cl:Ac to 1:1 Add 15 units/ day insulin Add standard MVI and trace elements + folic acid 1mg  and thiamine 100mg  to TPN Continue Resistant q4h SSI D/c Semglee to 10 units BID today - monitor CBGs closely with stress dose steroids d/c'd 9/11 and adjust regimen as needed.  Kcl 10 meq x 4 KPhos 15 mmol x 1 Monitor TPN labs F/u Surgery plans - no need for further surgery at this time per 9/11 note   , PharmD, [facility] Clinical Pharmacist Please see AMION for all Pharmacists' Contact Phone Numbers 10/26/2020, 8:49 AM

## 2020-10-26 NOTE — Progress Notes (Signed)
NAME:  Paul Chambers, MRN:  [id number], DOB:  1965-06-08, LOS: 5 ADMISSION DATE:  11/05/2020, CONSULTATION DATE:  10/15/2020 REFERRING MD:  EDP CHIEF COMPLAINT:  Abdominal Pain   History of Present Illness:  This 55 y.o. Caucasian male smoker presented to the [facility] Emergency Department via private vehicle with complaints of abdominal pain and altered mental status. At the time of clinical interview, the patient is quite somnolent, snoring loudly. He is maintaining his airway but not able to contribute meaningfully to clinical interview.  The patient's wife reports that this is the third presentation to a healthcare facility in the past month for abdominal pain ([facility], [facility] and now [facility]).  The patient's deteriorating mental status over the past 2 days prompted the wife to force the patient to come to the hospital.  In the ER tonight, CT head was negative for acute process, but CT abdomen showed an impressive pneumoperitoneum without an obvious location of perforation.  Surgery has already evaluated the patient and decided to reocmmend proceeding to surgery; the patient's wife provided consent.  Significant Hospital Events: Including procedures, antibiotic start and stop dates in addition to other pertinent events   9/8 - Presented to [facility] with abdominal pain. CT A/P with extensive pneumoperitoneum 9/9 - Ex-lap (no colonic perforation discovered, large necrotic fluid present surrounding pancreas. Transferred to ICU post-op, intubated and sedated.  9/10-received 1 unit PRBC for hemoglobin 6.7, went back on Levophed for short period of time 9/12 - Extubated to Venturi mask  Interim History / Subjective:   No overnight events. Extubated yesterday. Remains on Fentanyl and Precedex gtt.   This AM, patient remains confused. Oriented to person only.   Objective   Blood pressure 120/63, pulse 75, temperature 99.3 F (37.4 C), resp. rate (!) 23, height 5\' 9"  (1.753  m), weight 102.2 kg, SpO2 98 %.    Vent Mode: CPAP;PSV FiO2 (%):  [40 %-55 %] 55 % PEEP:  [5 cmH20] 5 cmH20 Pressure Support:  [8 cmH20] 8 cmH20  Intake/Output Summary (Last 24 hours) at 10/26/2020 0803 Last data filed at 10/26/2020 0600 Gross per 24 hour  Intake 2977.66 ml  Output 2923 ml  Net 54.66 ml   Filed Weights   10/23/20 0200 10/24/20 0403 10/25/20 0402  Weight: 98.2 kg 100.6 kg 102.2 kg   Examination:   Physical exam:  General: Crtitically ill-appearing middle-aged obese Caucasian male HEENT: /AT, eyes anicteric.  NG tube in place.  Neuro: Lethargic but answering questions. Oriented to person only. Moving all extremities spontaneously.  Chest: Rales in the bibasilar region. No wheezing or rhonchi.  Heart: Regular rate and rhythm, no murmurs or gallops.  Abdomen: Distended with absent bowel sounds.  2 drains in place in the RUQ with bloody thick output Extremities: 3+ pitting edema of the bilateral lower extremities with 2+ pitting edema of the bilateral upper extremities.  Skin: No rash  Resolved Hospital Problem list   Septic shock  Assessment & Plan:   Infected Necrotizing Pancreatitis with Pneumoperitoneum s/p ex-lap - General surgery following; appreciate their recommendations.  - Continue IV antibiotics with Linezolid, Zosyn and Fluconazole - Continue TPN - Fentanyl PRN for severe pain  Acute respiratory failure with hypoxia:  Likely due to aspiration pneumonia. Extubated yesterday without difficulty. Remains on venturi mask. Already on broad spectrum antibiotics for above.   - Continue supplemental oxygen to maintain O2 sats > 90%. Wean as tolerated.  - Aspiration precautions  Acute toxic-metabolic encephalopathy:  Initially in  the setting of sepsis, however likely an aspect of ICU delirium at this time. He remains on sedatives for agitation.   - Minimize sedation  - Discontinue Precedex - Valium PRN for agitation  Acute kidney injury:  Due to  septic ATN. Serum creatinine improving today with 2L UOP last 24 hours.   - Avoid nephrotoxic agents - Continue trending renal function   Acute hepatitis:  Initially in the setting of shock, however increase overnight in the setting of TPN.  LFTs increasing overnight however still relatively low.  - CMP daily   Alcohol abuse:  Out of the window for withdrawal complications.    - Continue thiamine and folate  Acute Blood Loss Anemia, postsurgical S/p 1 unit of pRBCs on 9/10 with stabilization in hemoglobin since.   - Monitor H&H and transfuse if less than 7  Hyponatremia: In the setting of critical illness. Possibly an aspect of beer potomania. Stable at this time. Resolved.   Severe protein calorie malnutrition - Continue TPN  Diabetes type 2 with hyperglycemia Hemoglobin A1c was 6.8% on admission. CBGs closer to goal this AM.   - Continue Lantus to 10 units BID  - Continue SSI (resistant), with CBG goal 140-180  Best Practice (right click and "Reselect all SmartList Selections" daily)   Diet/type: NPO with oral meds.  TPN DVT prophylaxis: prophylactic heparin  GI prophylaxis: PPI Lines: Central line and yes and it is still needed Foley:  Yes, and it is still needed Code Status:  full code Last date of multidisciplinary goals of care discussion [9/12, patient's wife was updated at bedside]  Labs   CBC: Recent Labs  Lab 10/22/20 1753 10/23/20 0425 10/23/20 1210 10/24/20 0349 10/24/20 1154 10/25/20 0328  WBC 39.2* 33.5* 35.7* 33.7*  --  25.7*  HGB 7.7* 6.7* 7.5* 7.9* 8.2* 8.0*  HCT 22.9* 20.5* 22.3* 23.7* 24.0* 24.1*  MCV 107.5* 109.0* 106.7* 106.8*  --  106.6*  PLT 250 224 245 205  --  197   Basic Metabolic Panel: Recent Labs  Lab 10/22/20 0357 10/22/20 0826 10/22/20 1753 10/23/20 0425 10/24/20 0349 10/24/20 1154 10/25/20 0328 10/26/20 0401  NA 126*   < > 126* 127* 128* 128* 132* 135  K 5.0   < > 5.0 5.0 4.7 4.7 4.5 3.5  CL 89*   < > 90* 92* 92*   --  97* 101  CO2 23   < > 24 25 24   --  25 25  GLUCOSE 205*   < > 185* 206* 285*  --  199* 123*  BUN 30*   < > 34* 40* 51*  --  51* 37*  CREATININE 1.29*   < > 1.92* 2.07* 2.18*  --  1.76* 1.12  CALCIUM 8.4*   < > 7.8* 7.7* 7.8*  --  8.0* 8.1*  MG 2.2  --   --  2.4 2.6*  --  2.4 2.2  PHOS 6.4*  --   --  7.7* 6.3*  --  4.0 2.3*   < > = values in this interval not displayed.   GFR: Estimated Creatinine Clearance: 90.9 mL/min (by C-G formula based on SCr of 1.12 mg/dL). Recent Labs  Lab 10/17/2020 2213 10/22/20 0357 10/22/20 12/22/20 10/22/20 12/22/20 10/22/20 1304 10/22/20 1753 10/23/20 0425 10/23/20 0910 10/23/20 1210 10/24/20 0349 10/25/20 0328  PROCALCITON 16.09 21.00  --   --   --   --   --   --   --   --   --  WBC  --  40.9*  --  53.9*  --    < > 33.5*  --  35.7* 33.7* 25.7*  LATICACIDVEN 2.0*  --  3.8* 4.9* 3.5*  --   --  1.8  --   --   --    < > = values in this interval not displayed.   Liver Function Tests: Recent Labs  Lab 10/22/20 0826 10/23/20 0425 10/24/20 0349 10/25/20 0328 10/26/20 0401  AST 75* 57* 56* 52* 84*  ALT 43 34 36 33 46*  ALKPHOS 122 85 112 113 190*  BILITOT 1.6* 1.3* 1.2 1.0 1.0  PROT 5.0* 4.9* 5.2* 5.3* 5.3*  ALBUMIN 2.0* 2.4* 2.0* 1.7* 1.5*   Recent Labs  Lab 10/26/2020 1506  LIPASE 25   Recent Labs  Lab 10/19/2020 2213  AMMONIA 37*   ABG    Component Value Date/Time   PHART 7.368 10/24/2020 1154   PCO2ART 46.2 10/24/2020 1154   PO2ART 71 (L) 10/24/2020 1154   HCO3 26.6 10/24/2020 1154   TCO2 28 10/24/2020 1154   O2SAT 93.0 10/24/2020 1154   Coagulation Profile: Recent Labs  Lab 10/31/2020 2102 10/22/20 0357 10/22/20 0826  INR 2.1* 2.1* 2.0*   Cardiac Enzymes: Recent Labs  Lab 10/28/2020 1550  CKTOTAL 27*   HbA1C: Hgb A1c MFr Bld  Date/Time Value Ref Range Status  11/10/2020 10:13 PM 6.8 (H) 4.8 - 5.6 % Final    Comment:    (NOTE) Pre diabetes:          5.7%-6.4%  Diabetes:              >6.4%  Glycemic control for    <7.0% adults with diabetes    CBG: Recent Labs  Lab 10/25/20 1536 10/25/20 2047 10/25/20 2358 10/26/20 0348 10/26/20 0738  GLUCAP 113* 117* 174* 120* 154*   Dr. Verdene Lennert Internal Medicine PGY-3 10/26/2020, 8:03 AM

## 2020-10-26 NOTE — Progress Notes (Signed)
Nutrition Follow-up  DOCUMENTATION CODES:   Severe malnutrition in context of acute illness/injury  INTERVENTION:   - Continue TPN per Pharmacy, estimated kcal needs adjusted after extubation  Estimated Nutritional Needs:  Kcal:  2350-2550 Protein:  125-145 grams Fluid:  >/= 2.0 L  - RD will monitor for diet advancement and order oral nutrition supplements as appropriate  NUTRITION DIAGNOSIS:   Severe Malnutrition related to acute illness (infected necrotic pancreatitis) as evidenced by moderate fat depletion, moderate muscle depletion, energy intake < or equal to 50% for > or equal to 5 days, percent weight loss (8.4% weight loss in 1.5 months).  Ongoing, being addressed via TPN  GOAL:   Patient will meet greater than or equal to 90% of their needs  Met with TPN  MONITOR:   Vent status, Labs, Weight trends, Skin, I & O's  REASON FOR ASSESSMENT:   Ventilator    ASSESSMENT:   56 year old male who presented to the ED on 9/08 with AMS and abdominal pain. Pt seen at [facility] 1 week PTA and was diagnosed with AKI and pancreatitis. PMH of HTN, gout, tobacco abuse, EtOH abuse. Pt admitted with sepsis secondary to infected necrotic pancreatitis.  9/09 - s/p ex-lap with pancreatic debridement for infected necrotic pancreatitis 9/10 - TPN initiated 9/12 - TPN increased to goal rate, extubated  Discussed pt with RN and during ICU rounds. Pt continues to receive TPN. Per CCM, pt with post-op ileus. Pt with NG tube to low intermittent suction. Pt remains confused. Plan is to wean precedex today.  TPN currently infusing at 75 ml/hr which provides 2192 kcal and 126 grams of protein daily. RD to adjust pt's estimated needs as pt is now extubated. Communicated this with Pharmacy.  Spoke with pt and wife at bedside. Pt mumbling with eyes closed. Pt's wife expressed understanding regarding nutrition plan of care.  Admit weight: 93.7 kg Current weight: 102.2 kg  Pt with +1 pitting  edema to BUE, +2 pitting edema to RLE, and +3 pitting edema to LLE. Will utilize admit weight of 93.7 kg as EDW.  Medications reviewed and include: SSI q 4 hours, IV protonix, IV diflucan, IV abx, IV KCl 10 mEq x 4 runs, IV potassium phosphate once  Labs reviewed: BUN 37, phosphorus 2.3, elevated LFTs, hemoglobin 6.5 CBG's: 113-174 x 24 hours  UOP: 2197 ml x 24 hours NGT: 500 ml x 24 hours R abd JP drain 1: 310 ml x 24 hours R abd JP drain 2: 70 ml x 24 hours I/O's: +7.3 L since admit  Diet Order:   Diet Order             Diet NPO time specified Except for: Ice Chips  Diet effective now                   EDUCATION NEEDS:   No education needs have been identified at this time  Skin:  Skin Assessment: Skin Integrity Issues: Stage I: coccyx Incisions: abdomen  Last BM:  no documented BM  Height:   Ht Readings from Last 1 Encounters:  10/20/2020 _0  (1.753 m)    Weight:   Wt Readings from Last 1 Encounters:  10/25/20 102.2 kg    BMI:  Body mass index is 33.27 kg/m.  Estimated Nutritional Needs:   Kcal:  9371-6967  Protein:  125-145 grams  Fluid:  >/= 2.0 L    Gustavus Bryant, MS, RD, LDN Inpatient Clinical Dietitian Please see AMiON for contact information.

## 2020-10-26 NOTE — Progress Notes (Signed)
NG tube removed by patient. Dr. Katrinka Blazing notified. NG tube placed in L nare and secured, abdominal x-ray ordered to confirm placement.

## 2020-10-26 NOTE — Progress Notes (Signed)
Date and time results received: 10/26/20 8:51   Test: Hemoglobin Critical Value: 6.5  Name of Provider Notified: Dr. Katrinka Blazing  Orders Received? Or Actions Taken?: see orders

## 2020-10-26 NOTE — TOC Progression Note (Signed)
Transition of Care [facility]) - Progression Note    Patient Details  Name: Paul Chambers MRN: [id number] Date of Birth: 1965-12-13  [facility] CM/SW Contact  Tom-Johnson, Hershal Coria, RN Phone Number: 10/26/2020, 1:26 PM  Clinical Narrative:    CM called by patient's wife about concerns she has financially. States patient was the one paying the bills prior to his hospitalization as she is on disability and they have a 65 yr old son with them. Wife states she gets $1200 a month from disability and she uses it to pay for insurance and mortgage. Does not have extra to pay for utilities. Wife referred to Financial Counseling and a copy of Work Administrator, sports and [employer] given to patient to apply for assistance. Wife grateful for the assistance. Encourage her to call if other assistance needed. CM will continue to follow with needs.     Barriers to Discharge: Continued Medical Work up  Expected Discharge Plan and Services     Discharge Planning Services: CM Consult   Living arrangements for the past 2 months: Single Family Home                                       Social Determinants of Health (SDOH) Interventions    Readmission Risk Interventions No flowsheet data found.

## 2020-10-26 NOTE — Progress Notes (Signed)
[facility] ADULT ICU REPLACEMENT PROTOCOL   The patient does apply for the [facility] Adult ICU Electrolyte Replacment Protocol based on the criteria listed below:   1.Exclusion criteria: TCTS patients, ECMO patients and Hypothermia Protocol, and   Dialysis patients 2. Is GFR >/= 30 ml/min? Yes.    Patient's GFR today is >60 3. Is SCr </= 2? Yes.   Patient's SCr is 1.12 mg/dL 4. Did SCr increase >/= 0.5 in 24 hours? No. 5.Pt's weight >40kg  Yes.   6. Abnormal electrolyte(s): K+ 3.5, Phos 2.3  7. Electrolytes replaced per protocol 8.  Call MD STAT for K+ </= 2.5, Phos </= 1, or Mag </= 1 Physician:  Lebron Conners 10/26/2020 5:41 AM

## 2020-10-27 DIAGNOSIS — A419 Sepsis, unspecified organism: Secondary | ICD-10-CM | POA: Diagnosis not present

## 2020-10-27 DIAGNOSIS — R6520 Severe sepsis without septic shock: Secondary | ICD-10-CM | POA: Diagnosis not present

## 2020-10-27 LAB — COMPREHENSIVE METABOLIC PANEL
ALT: 35 U/L (ref 0–44)
AST: 44 U/L — ABNORMAL HIGH (ref 15–41)
Albumin: 1.5 g/dL — ABNORMAL LOW (ref 3.5–5.0)
Alkaline Phosphatase: 130 U/L — ABNORMAL HIGH (ref 38–126)
Anion gap: 9 (ref 5–15)
BUN: 22 mg/dL — ABNORMAL HIGH (ref 6–20)
CO2: 25 mmol/L (ref 22–32)
Calcium: 8.3 mg/dL — ABNORMAL LOW (ref 8.9–10.3)
Chloride: 104 mmol/L (ref 98–111)
Creatinine, Ser: 0.87 mg/dL (ref 0.61–1.24)
GFR, Estimated: >60 mL/min (ref >60)
Glucose, Bld: 252 mg/dL — ABNORMAL HIGH (ref 70–99)
Potassium: 3.3 mmol/L — ABNORMAL LOW (ref 3.5–5.1)
Sodium: 138 mmol/L (ref 135–145)
Total Bilirubin: 1 mg/dL (ref 0.3–1.2)
Total Protein: 5.3 g/dL — ABNORMAL LOW (ref 6.5–8.1)

## 2020-10-27 LAB — CBC WITH DIFFERENTIAL/PLATELET
Abs Immature Granulocytes: 0.2 10*3/uL — ABNORMAL HIGH (ref 0.00–0.07)
Basophils Absolute: 0 10*3/uL (ref 0.0–0.1)
Basophils Relative: 0 %
Eosinophils Absolute: 0 10*3/uL (ref 0.0–0.5)
Eosinophils Relative: 0 %
HCT: 25.5 % — ABNORMAL LOW (ref 39.0–52.0)
Hemoglobin: 8.2 g/dL — ABNORMAL LOW (ref 13.0–17.0)
Lymphocytes Relative: 4 %
Lymphs Abs: 0.7 10*3/uL (ref 0.7–4.0)
MCH: 33.5 pg (ref 26.0–34.0)
MCHC: 32.2 g/dL (ref 30.0–36.0)
MCV: 104.1 fL — ABNORMAL HIGH (ref 80.0–100.0)
Monocytes Absolute: 0 10*3/uL — ABNORMAL LOW (ref 0.1–1.0)
Monocytes Relative: 0 %
Myelocytes: 1 %
Neutro Abs: 15.7 10*3/uL — ABNORMAL HIGH (ref 1.7–7.7)
Neutrophils Relative %: 95 %
Platelets: 158 10*3/uL (ref 150–400)
RBC: 2.45 MIL/uL — ABNORMAL LOW (ref 4.22–5.81)
RDW: 19.8 % — ABNORMAL HIGH (ref 11.5–15.5)
WBC: 16.5 10*3/uL — ABNORMAL HIGH (ref 4.0–10.5)
nRBC: 0 /100 WBC
nRBC: 0.5 % — ABNORMAL HIGH (ref 0.0–0.2)

## 2020-10-27 LAB — CULTURE, BLOOD (ROUTINE X 2)
Culture: NO GROWTH
Special Requests: ADEQUATE

## 2020-10-27 LAB — PHOSPHORUS: Phosphorus: 2.6 mg/dL (ref 2.5–4.6)

## 2020-10-27 LAB — TYPE AND SCREEN
ABO/RH(D): A POS
Antibody Screen: NEGATIVE
Unit division: 0

## 2020-10-27 LAB — GLUCOSE, CAPILLARY
Glucose-Capillary: 160 mg/dL — ABNORMAL HIGH (ref 70–99)
Glucose-Capillary: 180 mg/dL — ABNORMAL HIGH (ref 70–99)
Glucose-Capillary: 190 mg/dL — ABNORMAL HIGH (ref 70–99)
Glucose-Capillary: 194 mg/dL — ABNORMAL HIGH (ref 70–99)
Glucose-Capillary: 221 mg/dL — ABNORMAL HIGH (ref 70–99)
Glucose-Capillary: 226 mg/dL — ABNORMAL HIGH (ref 70–99)

## 2020-10-27 LAB — BPAM RBC
Blood Product Expiration Date: 202210082359
ISSUE DATE / TIME: 202209131347
Unit Type and Rh: 6200

## 2020-10-27 LAB — MAGNESIUM: Magnesium: 2.1 mg/dL (ref 1.7–2.4)

## 2020-10-27 MED ORDER — POTASSIUM CHLORIDE 20 MEQ PO PACK: 20.0000 meq | PACK | ORAL | Status: DC

## 2020-10-27 MED ORDER — KETOROLAC TROMETHAMINE 15 MG/ML IJ SOLN
15.0000 mg | Freq: Four times a day (QID) | INTRAMUSCULAR | Status: DC | PRN
Start: 2020-10-27 — End: 2020-10-28

## 2020-10-27 MED ORDER — DIPHENHYDRAMINE HCL 12.5 MG/5ML PO ELIX: 12.5000 mg | ORAL_SOLUTION | Freq: Four times a day (QID) | ORAL | Status: DC | PRN

## 2020-10-27 MED ORDER — ONDANSETRON HCL 4 MG/2ML IJ SOLN: 4.0000 mg | Freq: Four times a day (QID) | INTRAMUSCULAR | Status: DC | PRN

## 2020-10-27 MED ORDER — ACETAMINOPHEN 10 MG/ML IV SOLN
1000.0000 mg | Freq: Once | INTRAVENOUS | Status: AC
  Administered 2020-10-27: 1000 mg via INTRAVENOUS
  Filled 2020-10-27: qty 100

## 2020-10-27 MED ORDER — HYDROMORPHONE HCL 1 MG/ML IJ SOLN
0.5000 mg | INTRAMUSCULAR | Status: AC | PRN
  Administered 2020-10-27 – 2020-10-28 (×2): 1 mg via INTRAVENOUS
  Filled 2020-10-27 (×2): qty 1

## 2020-10-27 MED ORDER — TRAVASOL 10 % IV SOLN
INTRAVENOUS | Status: AC
  Filled 2020-10-27: qty 1320

## 2020-10-27 MED ORDER — HYDROMORPHONE HCL 1 MG/ML IJ SOLN
0.5000 mg | INTRAMUSCULAR | Status: DC | PRN
  Administered 2020-10-27 (×2): 1 mg via INTRAVENOUS
  Filled 2020-10-27 (×2): qty 1

## 2020-10-27 MED ORDER — HYDROMORPHONE 1 MG/ML IV SOLN
INTRAVENOUS | Status: DC
Start: 2020-10-27 — End: 2020-10-27
  Administered 2020-10-27: 0.4 mg via INTRAVENOUS
  Administered 2020-10-27: 2.9 mg via INTRAVENOUS
  Filled 2020-10-27: qty 30

## 2020-10-27 MED ORDER — NALOXONE HCL 0.4 MG/ML IJ SOLN: 0.4000 mg | INTRAMUSCULAR | Status: DC | PRN

## 2020-10-27 MED ORDER — ACETAMINOPHEN 10 MG/ML IV SOLN
1000.0000 mg | Freq: Once | INTRAVENOUS | Status: AC
  Administered 2020-10-28: 1000 mg via INTRAVENOUS
  Filled 2020-10-27: qty 100

## 2020-10-27 MED ORDER — INSULIN GLARGINE-YFGN 100 UNIT/ML ~~LOC~~ SOLN
10.0000 [IU] | Freq: Two times a day (BID) | SUBCUTANEOUS | Status: DC
  Administered 2020-10-27: 10 [IU] via SUBCUTANEOUS
  Filled 2020-10-27 (×2): qty 0.1

## 2020-10-27 MED ORDER — DIPHENHYDRAMINE HCL 50 MG/ML IJ SOLN
12.5000 mg | Freq: Four times a day (QID) | INTRAMUSCULAR | Status: DC | PRN
Start: 2020-10-27 — End: 2020-10-27

## 2020-10-27 MED ORDER — POTASSIUM CHLORIDE 10 MEQ/50ML IV SOLN
10.0000 meq | INTRAVENOUS | Status: AC
  Administered 2020-10-27 (×4): 10 meq via INTRAVENOUS
  Filled 2020-10-27 (×4): qty 50

## 2020-10-27 MED ORDER — SODIUM CHLORIDE 0.9% FLUSH
9.0000 mL | INTRAVENOUS | Status: DC | PRN
Start: 2020-10-27 — End: 2020-10-27

## 2020-10-27 MED ORDER — KETOROLAC TROMETHAMINE 15 MG/ML IJ SOLN
15.0000 mg | Freq: Four times a day (QID) | INTRAMUSCULAR | Status: DC | PRN
Start: 2020-10-27 — End: 2020-10-27

## 2020-10-27 NOTE — Progress Notes (Addendum)
6 Days Post-Op  Subjective: CC: He reports abdominal soreness that is generalized. Having more back pain. BM this morning. Foley out and voiding. WBC stable. Afebrile.   Objective: Vital signs in last 24 hours: Temp:  [98.1 F (36.7 C)-99.6 F (37.6 C)] 98.1 F (36.7 C) (09/14 0800) Pulse Rate:  [85-126] 103 (09/14 0800) Resp:  [20-33] 29 (09/14 0800) BP: (115-146)/(65-84) 144/82 (09/14 0800) SpO2:  [92 %-97 %] 97 % (09/14 0800) Last BM Date:  (PTA)  Intake/Output from previous day: 09/13 0701 - 09/14 0700 In: 3801.6 [I.V.:2017.5; Blood:315; IV Piggyback:1469.1] Out: 2868 [Urine:2600; Drains:268] Intake/Output this shift: Total I/O In: 105.1 [I.V.:75; IV Piggyback:30.2] Out: 75 [Drains:75]  PE: Gen:  Awake and alert, nad Card:  Reg Pulm:  Normal rate and effort  Abd: Distended but soft, generalized tenderness without rigidity or guarding. More active bowel sounds. NGT in place thin brown output in cannister. RUQ drains x2 with cloudy bloody/brown output w/ sediment. Midline wound clean without dehiscence.  9F - 188 68F - 88 UOP - 1.1cc/kg/hr in the last 24 hours  Lab Results:  Recent Labs    10/26/20 1811 10/27/20 0500  WBC 17.8* 16.5*  HGB 9.2* 8.2*  HCT 28.3* 25.5*  PLT 169 158   BMET Recent Labs    10/26/20 1811 10/27/20 0500  NA 136 138  K 3.5 3.3*  CL 101 104  CO2 24 25  GLUCOSE 223* 252*  BUN 27* 22*  CREATININE 0.90 0.87  CALCIUM 8.3* 8.3*   PT/INR No results for input(s): LABPROT, INR in the last 72 hours. CMP     Component Value Date/Time   NA 138 10/27/2020 0500   K 3.3 (L) 10/27/2020 0500   CL 104 10/27/2020 0500   CO2 25 10/27/2020 0500   GLUCOSE 252 (H) 10/27/2020 0500   BUN 22 (H) 10/27/2020 0500   CREATININE 0.87 10/27/2020 0500   CALCIUM 8.3 (L) 10/27/2020 0500   PROT 5.3 (L) 10/27/2020 0500   ALBUMIN 1.5 (L) 10/27/2020 0500   AST 44 (H) 10/27/2020 0500   ALT 35 10/27/2020 0500   ALKPHOS 130 (H) 10/27/2020 0500    BILITOT 1.0 10/27/2020 0500   GFRNONAA >60 10/27/2020 0500   Lipase     Component Value Date/Time   LIPASE 25 11/10/2020 1506    Studies/Results: DG Abd 1 View  Result Date: 10/25/2020 CLINICAL DATA:  Insert tube placement EXAM: ABDOMEN - 1 VIEW COMPARISON:  10/27/2020 CT abdomen/pelvis FINDINGS: Enteric tube terminates in the proximal stomach with side port in the proximal stomach. Superior approach central venous catheter terminates at the cavoatrial junction. Surgical drains terminate in the medial upper abdomen bilaterally. Small amount of scattered retroperitoneal gas noted in the upper left abdomen as seen on prior CT. Small left pleural effusion. IMPRESSION: Enteric tube terminates in the proximal stomach. Electronically Signed   By: Delbert Phenix M.D.   On: 10/25/2020 13:53   DG Abd Portable 1V  Result Date: 10/26/2020 CLINICAL DATA:  NG tube placement. EXAM: PORTABLE ABDOMEN - 1 VIEW COMPARISON:  Abdominal x-ray 10/25/2020. FINDINGS: Enteric tube tip terminates in the mid stomach. Lines overlie the mid abdomen, unchanged. No dilated bowel loops are visualized. IMPRESSION: 1. Enteric tube tip terminates in the mid stomach. Electronically Signed   By: Darliss Cheney M.D.   On: 10/26/2020 19:23   [place] EKG SITE RITE  Result Date: 10/25/2020 If Site Rite image not attached, placement could not be confirmed due to current cardiac rhythm.  Anti-infectives: Anti-infectives (From admission, onward)    Start     Dose/Rate Route Frequency Ordered Stop   10/25/20 2300  fluconazole (DIFLUCAN) IVPB 400 mg        400 mg 100 mL/hr over 120 Minutes Intravenous Every 24 hours 10/25/20 0742 10/29/20 2259   10/24/20 2300  fluconazole (DIFLUCAN) IVPB 200 mg  Status:  Discontinued        200 mg 100 mL/hr over 60 Minutes Intravenous Every 24 hours 10/24/20 0943 10/25/20 0742   10/22/20 1800  vancomycin (VANCOREADY) IVPB 1500 mg/300 mL  Status:  Discontinued        1,500 mg 150 mL/hr over 120 Minutes  Intravenous Every 24 hours 11/06/2020 2225 10/22/20 0759   10/22/20 0900  linezolid (ZYVOX) IVPB 600 mg  Status:  Discontinued        600 mg 300 mL/hr over 60 Minutes Intravenous Every 12 hours 10/22/20 0800 10/27/20 0852   10/22/20 0300  piperacillin-tazobactam (ZOSYN) IVPB 3.375 g        3.375 g 12.5 mL/hr over 240 Minutes Intravenous Every 8 hours 11/04/2020 2225 10/29/20 0559   10/16/2020 2335  metroNIDAZOLE (FLAGYL) IVPB 500 mg  Status:  Discontinued        500 mg 100 mL/hr over 60 Minutes Intravenous Every 12 hours 10/30/2020 2142 10/22/20 0747   11/12/2020 2248  fluconazole (DIFLUCAN) IVPB 400 mg  Status:  Discontinued        400 mg 100 mL/hr over 120 Minutes Intravenous Every 24 hours 10/26/2020 2142 10/24/20 0943   11/04/2020 1748  vancomycin (VANCOREADY) IVPB 2000 mg/400 mL        2,000 mg 200 mL/hr over 120 Minutes Intravenous  Once 10/24/2020 1706 10/14/2020 2113   11/10/2020 1715  piperacillin-tazobactam (ZOSYN) IVPB 3.375 g        3.375 g 100 mL/hr over 30 Minutes Intravenous  Once 10/14/2020 1706 10/30/2020 1903        Assessment/Plan POD 5 s/p ex lap with pancreatic debridement for infected necrotic pancreatitis by Dr. Sheliah Hatch on 9/9 - WBC stable from 33.7 > 25.7 > 16.7 > 16.5, afebrile. Cont abx - Cont drains. Output down for 32f drain. Monitor  - Superior drain under the mesocolon along with the head of the pancreas - Inferior drain under the mesocolon along the left upper abdomen and what is likely a position inferior to the pancreas - Ileus appears to be improving. Will discuss with MD if can start clamping trial - Cont TPN for nutrition - PCA for pain - We do not recommend further surgery at this time. Continue medical management/supportive care per CCM and greatly appreciate their help   FEN: NGT, TPN ID: zosyn, fluconazole VTE: heparin subq Foley - out, voiding    - Per primary -  VDRF - now extubated Abl anemia - s/p 1U PRBC 9/13 w/ appropriate response. Hgb 8.2 ETOH use  - weaned off precedex. Consider CIWA AKI - resolved, good uop T2DM   LOS: 6 days    Jacinto Halim , [facility] Surgery 10/27/2020, 9:40 AM Please see Amion for pager number during day hours 7:00am-4:30pm

## 2020-10-27 NOTE — Progress Notes (Signed)
PHARMACY - TOTAL PARENTERAL NUTRITION CONSULT NOTE   Indication:  severe pancreatitis  Patient Measurements: Height: 5\' 9"  (175.3 cm) Weight: 102.2 kg (225 lb 5 oz) IBW/kg (Calculated) : 70.7 TPN AdjBW (KG): 78 Body mass index is 33.27 kg/m.  Assessment: 83 yom presenting 9/8 with septic shock due to necrotizing pancreatitis with pneumoperitoneum s/p ex-lap 9/9. Pt transferred to ICU post-op, intubated and sedated. Pharmacy consulted to start TPN for severe pancreatitis. Pt at risk of refeeding with minimal PO intake x 1 week PTA, decreased PO intake x 1.5 months PTA with significant weight loss and hx heavy alcohol abuse.  Patient intubated, currently sedated on fentanyl drip only, propofol d/c'd 9/9). Transitioned off vasopressors again 9/10.   Glucose / Insulin: A1c 6.8 (no meds pta). CBGs uncontrolled due to stress dose steroids but improving (steroids stopped 9/11), 221-252. Utilized 47 units mSSI in last 24hrs (Semglee 10 given this AM - have discontinued) - 15 units in TPN.  Electrolytes: Na up to 135 (124 on admit), K down to 3.3 (replacing), Cl 104, others WNL Renal: AKI resolved - SCr 0.87, BUN down 22 Hepatic: AST down 44, ALT / Tbili normalized, TG WNL, albumin 1.5; Triglycerides 97 Intake / Output; MIVF: NGT clamped 9/14, drain output down 221ml/24hrs, UOP 1.1 ml/kg/hr GI Imaging: 9/8 CT A/P - extensive pneumoperitoneum GI Surgeries / Procedures: 9/9 ex-lap (no colonic perforation, large necrotic fluid present surrounding pancreas)  Central access: CVC 9/9 TPN start date: 9/10  Nutritional Goals: Goal TPN rate is 100 mL/hr (provides 132 g of protein and 2390 kcals per day) meeting 100% of needs  RD Assessment: Estimated Needs Total Energy Estimated Needs: 2350-2550 Total Protein Estimated Needs: 125-145 grams Total Fluid Estimated Needs: >/= 2.0 L  Current Nutrition:  NPO and TPN  Plan:  Adjusting TPN for new goals Adjust TPN to 100 mL/hr at 1800  Electrolytes  in TPN: change Na to 24mEq/L, increase K to 25 meq/L; Phos 3 meq/L, Ca 57mEq/L, Mg 37mEq/L. Cl:Ac to 1:1  Add standard MVI and trace elements + folic acid 1mg  and thiamine 100mg  to TPN Continue Resistant q4h SSI Discontinue Semglee Increase Insulin to 45 units in TPN Monitor TPN labs F/u Surgery plans - NGT clamped today, follow-up toleration and ability to start enteral feeds   3m, PharmD, BCPS, BCCCP Clinical Pharmacist Please refer to [facility] for [facility] Pharmacy numbers 10/27/2020, 8:31 AM

## 2020-10-27 NOTE — Progress Notes (Signed)
eLink Physician-Brief Progress Note Patient Name: Paul Chambers DOB: November 02, 1965 MRN: [id number]   Date of Service  10/27/2020  HPI/Events of Note  Patient with extreme agitation and delirium, he's pulled out multiple lines and drains.  eICU Interventions  PCA Dilaudid discontinued and PRN iv Dilaudid substituted, bilateral soft restraints ordered, Toradol 15 mg iv Q 6 hours PRN pain x 3 doses ordered, Ofirmev 1000 mg iv x 1 ordered.        Thomasene Lot Jaclyn Andy 10/27/2020, 10:35 PM

## 2020-10-27 NOTE — Progress Notes (Addendum)
NAME:  Paul Chambers, MRN:  [id number], DOB:  1965/12/02, LOS: 6 ADMISSION DATE:  11/05/2020, CONSULTATION DATE:  11/01/2020 REFERRING MD:  EDP CHIEF COMPLAINT:  Abdominal Pain   History of Present Illness:  This 55 y.o. Caucasian male smoker presented to the [facility] Emergency Department via private vehicle with complaints of abdominal pain and altered mental status. At the time of clinical interview, the patient is quite somnolent, snoring loudly. He is maintaining his airway but not able to contribute meaningfully to clinical interview.  The patient's wife reports that this is the third presentation to a healthcare facility in the past month for abdominal pain ([facility], [facility] and now [facility]).  The patient's deteriorating mental status over the past 2 days prompted the wife to force the patient to come to the hospital.  In the ER tonight, CT head was negative for acute process, but CT abdomen showed an impressive pneumoperitoneum without an obvious location of perforation.  Surgery has already evaluated the patient and decided to reocmmend proceeding to surgery; the patient's wife provided consent.  Significant Hospital Events: Including procedures, antibiotic start and stop dates in addition to other pertinent events   9/8 - Presented to [facility] with abdominal pain. CT A/P with extensive pneumoperitoneum 9/9 - Ex-lap (no colonic perforation discovered, large necrotic fluid present surrounding pancreas. Transferred to ICU post-op, intubated and sedated.  9/10-received 1 unit PRBC for hemoglobin 6.7, went back on Levophed for short period of time 9/12 - Extubated to Venturi mask 9/13 - Off Fentanyl and Propofol gtt  Interim History / Subjective:   Difficulty controlling pain overnight. Fentanyl changed to Dilaudid, Toradol and Tylenol. Multiple bowel movements overnight.   This AM, patient is alert and awake. He endorses abdominal pain and back pain. We discussed  transitioning to a PCA pump and he is in agreement.   Objective   Blood pressure 129/77, pulse (!) 101, temperature 99.6 F (37.6 C), temperature source Axillary, resp. rate (!) 27, height 5\' 9"  (1.753 m), weight 102.2 kg, SpO2 94 %.       Intake/Output Summary (Last 24 hours) at 10/27/2020 0735 Last data filed at 10/27/2020 0500 Gross per 24 hour  Intake 3604.03 ml  Output 2868 ml  Net 736.03 ml    Filed Weights   10/23/20 0200 10/24/20 0403 10/25/20 0402  Weight: 98.2 kg 100.6 kg 102.2 kg   Examination:   Physical exam:  General: Crtitically ill-appearing middle-aged obese Caucasian male HEENT: [place]/AT, eyes anicteric.  NG tube in place.  Neuro: More awake today, following commands. Oriented to person and place only. Moving all extremities spontaneously.  Chest: Expiratory wheezing, mild. No rhonchi or rales. No increased work of breathing.  Heart: Regular rate and rhythm, no murmurs or gallops.  Abdomen: Distended with hypoactive bowel sounds.  2 drains in place in the RUQ with bloody thick output Extremities: 2+ pitting edema of the bilateral lower extremities with 1+ pitting edema of the bilateral upper extremities.  Skin: No rash  Resolved Hospital Problem list   Septic shock Acute kidney injury Hyponatremia   Assessment & Plan:   Infected Necrotizing Pancreatitis with Pneumoperitoneum s/p ex-lap - General surgery following; appreciate their recommendations.  - Continue IV antibiotics with Zosyn and Fluconazole, 7 day planned - Discontinue Linezolid, as he has completed course.  - Continue TPN - Will start Dilaudid PCA for pain control given inadequate response to Fentanyl pushes   Acute respiratory failure with hypoxia: Likely due to aspiration pneumonia. -  Continue supplemental oxygen to maintain O2 sats > 90%. Wean as tolerated.  - Aspiration precautions - Bronchodilators as tolerated  Acute toxic-metabolic encephalopathy: Sepsis +/- ICU delirium  - Minimize  sedation  - Valium PRN for agitation  Ileus: Post-op. Multiple bowel movements today - NGT clamping trial.   Acute hepatitis: Stable. 2/2 to TPN - CMP daily   Alcohol abuse: Out of the window for withdrawal complications.   - Continue thiamine and folate  Acute Blood Loss Anemia, postsurgical: S/p 1 unit of pRBCs on 9/10 with stabilization in hemoglobin since.  - Monitor H&H and transfuse if less than 7  Severe protein calorie malnutrition - Continue TPN  Diabetes type 2 with hyperglycemia: Hemoglobin A1c was 6.8% on admission. CBGs closer to goal this AM.  - Continue Lantus 10 units BID  - Continue SSI (resistant), with CBG goal 140-180  Hypokalemia  - Replenish PRN    Stable for transfer to progressive floor.   Best Practice (right click and "Reselect all SmartList Selections" daily)   Diet/type: NPO with oral meds.  TPN DVT prophylaxis: prophylactic heparin  GI prophylaxis: PPI Lines: Central line and yes and it is still needed Foley:  Yes, and it is still needed Code Status:  full code Last date of multidisciplinary goals of care discussion [9/12, patient's wife was updated at bedside]  Labs   CBC: Recent Labs  Lab 10/25/20 0328 10/26/20 0808 10/26/20 1056 10/26/20 1811 10/27/20 0500  WBC 25.7* 16.7* 15.4* 17.8* 16.5*  NEUTROABS  --  15.0*  --   --  15.7*  HGB 8.0* 6.5* 6.5* 9.2* 8.2*  HCT 24.1* 20.4* 22.3* 28.3* 25.5*  MCV 106.6* 113.3* 129.7* 104.0* 104.1*  PLT 197 150 139* 169 158    Basic Metabolic Panel: Recent Labs  Lab 10/23/20 0425 10/24/20 0349 10/24/20 1154 10/25/20 0328 10/26/20 0401 10/26/20 1811 10/27/20 0500  NA 127* 128* 128* 132* 135 136 138  K 5.0 4.7 4.7 4.5 3.5 3.5 3.3*  CL 92* 92*  --  97* 101 101 104  CO2 25 24  --  25 25 24 25   GLUCOSE 206* 285*  --  199* 123* 223* 252*  BUN 40* 51*  --  51* 37* 27* 22*  CREATININE 2.07* 2.18*  --  1.76* 1.12 0.90 0.87  CALCIUM 7.7* 7.8*  --  8.0* 8.1* 8.3* 8.3*  MG 2.4 2.6*  --  2.4  2.2  --  2.1  PHOS 7.7* 6.3*  --  4.0 2.3*  --  2.6    GFR: Estimated Creatinine Clearance: 117 mL/min (by C-G formula based on SCr of 0.87 mg/dL). Recent Labs  Lab 10/23/2020 2213 10/22/20 0357 10/22/20 12/22/20 10/22/20 0826 10/22/20 1304 10/22/20 1753 10/23/20 0910 10/23/20 1210 10/26/20 0808 10/26/20 1056 10/26/20 1811 10/27/20 0500  PROCALCITON 16.09 21.00  --   --   --   --   --   --   --   --   --   --   WBC  --  40.9*  --  53.9*  --    < >  --    < > 16.7* 15.4* 17.8* 16.5*  LATICACIDVEN 2.0*  --  3.8* 4.9* 3.5*  --  1.8  --   --   --   --   --    < > = values in this interval not displayed.    Liver Function Tests: Recent Labs  Lab 10/23/20 0425 10/24/20 0349 10/25/20 0328 10/26/20 0401 10/27/20 0500  AST 57* 56* 52* 84* 44*  ALT 34 36 33 46* 35  ALKPHOS 85 112 113 190* 130*  BILITOT 1.3* 1.2 1.0 1.0 1.0  PROT 4.9* 5.2* 5.3* 5.3* 5.3*  ALBUMIN 2.4* 2.0* 1.7* 1.5* 1.5*    Recent Labs  Lab 10/26/2020 1506  LIPASE 25    Recent Labs  Lab 10/16/2020 2213  AMMONIA 37*    ABG    Component Value Date/Time   PHART 7.368 10/24/2020 1154   PCO2ART 46.2 10/24/2020 1154   PO2ART 71 (L) 10/24/2020 1154   HCO3 26.6 10/24/2020 1154   TCO2 28 10/24/2020 1154   O2SAT 93.0 10/24/2020 1154   Coagulation Profile: Recent Labs  Lab 10/24/2020 2102 10/22/20 0357 10/22/20 0826  INR 2.1* 2.1* 2.0*    Cardiac Enzymes: Recent Labs  Lab 10/27/2020 1550  CKTOTAL 27*    HbA1C: Hgb A1c MFr Bld  Date/Time Value Ref Range Status  10/24/2020 10:13 PM 6.8 (H) 4.8 - 5.6 % Final    Comment:    (NOTE) Pre diabetes:          5.7%-6.4%  Diabetes:              >6.4%  Glycemic control for   <7.0% adults with diabetes    CBG: Recent Labs  Lab 10/26/20 1123 10/26/20 1528 10/26/20 1936 10/26/20 2318 10/27/20 0334  GLUCAP 227* 254* 235* 251* 226*    Dr. Verdene Lennert Internal Medicine PGY-3 10/27/2020, 7:35 AM

## 2020-10-27 NOTE — Progress Notes (Signed)
Pharmacy Antibiotic Note  Paul Chambers is a 55 y.o. male admitted on 10/17/2020 with  intra-abdominal infection . Pharmacy has been consulted for zosyn dosing. Also on Fluconazole dosing.   Patient presenting with 1 week of abdominal pain. Patient recently seen at [facility] and diagnosed with AKI and pancreatitis.  AKI resolving - SCr much improved 0.87; WBC down to 16.5; afebrile; cultures negative to date. Completed 6 days of Linezolid for concern of gas/toxin production.   Plan: Fluconazole per MD - end date 9/16.  Zosyn 3.375g IV q8h (4 hour infusion). - end date 9/16.  No further adjustments needed - pharmacy will sign off consult.   Height: 5\' 9"  (175.3 cm) Weight: 102.2 kg (225 lb 5 oz) IBW/kg (Calculated) : 70.7  Temp (24hrs), Avg:98.8 F (37.1 C), Min:98.1 F (36.7 C), Max:99.6 F (37.6 C)  Recent Labs  Lab 10/20/2020 2213 10/22/20 0357 10/22/20 12/22/20 10/22/20 0826 10/22/20 1304 10/22/20 1753 10/23/20 0910 10/23/20 1210 10/24/20 0349 10/25/20 0328 10/26/20 0401 10/26/20 0808 10/26/20 1056 10/26/20 1811 10/27/20 0500  WBC  --    < >  --  53.9*  --    < >  --    < > 33.7* 25.7*  --  16.7* 15.4* 17.8* 16.5*  CREATININE  --    < >  --  1.86*  --    < >  --   --  2.18* 1.76* 1.12  --   --  0.90 0.87  LATICACIDVEN 2.0*  --  3.8* 4.9* 3.5*  --  1.8  --   --   --   --   --   --   --   --    < > = values in this interval not displayed.    Estimated Creatinine Clearance: 117 mL/min (by C-G formula based on SCr of 0.87 mg/dL).    No Known Allergies  Antimicrobials this admission: zosyn 9/8 >>  vancomycin 9/8 x1 Metronidazole 9/8 x1 Fluconazole 9/8 >>  Microbiology results: 9/9 BCx negative 9/9 MRSA PCR negative  Thank you for allowing pharmacy to be a part of this patient's care.  11/9, PharmD, BCPS, BCCCP Clinical Pharmacist Please refer to [facility] numbers 10/27/2020 11:45 AM

## 2020-10-27 NOTE — Progress Notes (Signed)
[facility] ADULT ICU REPLACEMENT PROTOCOL   The patient does apply for the [facility] Adult ICU Electrolyte Replacment Protocol based on the criteria listed below:   1.Exclusion criteria: TCTS patients, ECMO patients and Hypothermia Protocol, and   Dialysis patients 2. Is GFR >/= 30 ml/min? Yes.    Patient's GFR today is >60 3. Is SCr </= 2? No. Patient's SCr is 0.87 mg/dL 4. Did SCr increase >/= 0.5 in 24 hours? No. 5.Pt's weight >40kg  Yes.   6. Abnormal electrolyte(s): K+ 3.3  7. Electrolytes replaced per protocol 8.  Call MD STAT for K+ </= 2.5, Phos </= 1, or Mag </= 1 Physician:  n/a  Melvern Banker 10/27/2020 6:01 AM

## 2020-10-27 NOTE — Progress Notes (Signed)
Patient just notes to have pulled out NG tube and pulled off Nasal Cannula with CO2 monitor for PCA. Notified surgery PA via secure chat. Will wait for recommendations/new orders.

## 2020-10-27 NOTE — Progress Notes (Signed)
eLink Physician-Brief Progress Note Patient Name: Paul Chambers DOB: 09/28/1965 MRN: [id number]   Date of Service  10/27/2020  HPI/Events of Note  Patient with excruciating pain s/p pancreatic debridement, iv Fentanyl 100 mcg x 3 has failed to meaningfully impact the pain.  eICU Interventions  Will discontinue Fentanyl and substitute Dilaudid + limited doses of Toradol + one time dose of iv Tylenol 1000 mg to attempt to reset then pain level with a multi-modal pain control strategy.        [name] 10/27/2020, 12:36 AM

## 2020-10-27 NOTE — Progress Notes (Addendum)
Inpatient Diabetes Program Recommendations  AACE/ADA: New Consensus Statement on Inpatient Glycemic Control  Target Ranges:  Prepandial:   less than 140 mg/dL      Peak postprandial:   less than 180 mg/dL (1-2 hours)      Critically ill patients:  140 - 180 mg/dL    Results for PRIMUS, GRITTON (MRN [id number]) as of 10/27/2020 11:11  Ref. Range 10/26/2020 07:38 10/26/2020 11:23 10/26/2020 15:28 10/26/2020 19:36 10/26/2020 23:18 10/27/2020 03:34 10/27/2020 08:16  Glucose-Capillary Latest Ref Range: 70 - 99 mg/dL 810 (H)  Novolog 4 units  Semglee 10 units@9 :48 227 (H)  Novolog 7 units 254 (H)  Novolog11 units 235 (H)  Novolog 7 units  Semglee 5 units@21 :03 251 (H)  Novolog 11 units 226 (H)  Novolog 7 units 221 (H)  Novolog 7 units  Semglee 10 units@9 :31   Review of Glycemic Control  Current orders for Inpatient glycemic control: Semglee 10 units BID, Novolog 0-20 units Q4H; TPN @ 75 ml/hr with 15 units of regular insulin  Inpatient Diabetes Program Recommendations:    Insulin in TPN: Noted Semglee increased today. Patient has received a total of 54 units of Novolog correction over the past 24 hours and glucose has remained consistently in 200's mg/dl. Safer to add insulin to TPN versus SQ insulin; if SQ insulin is increased and TPN is stopped or held then increased risk of hypoglycemia for duration of insulin.  Shanda Bumps, RPh notes plan to discontinue Semglee and adjust insulin in TPN.   If TPN is continued at current rate of 75 ml/hr and Semglee discontinued, would recommend increasing insulin in TPN to 60 units.  Thanks, Orlando Penner, RN, MSN, CDE Diabetes Coordinator Inpatient Diabetes Program [phone] (Team Pager from 8am to 5pm)

## 2020-10-28 ENCOUNTER — Inpatient Hospital Stay (HOSPITAL_COMMUNITY): Payer: BC Managed Care – PPO

## 2020-10-28 DIAGNOSIS — R6520 Severe sepsis without septic shock: Secondary | ICD-10-CM | POA: Diagnosis not present

## 2020-10-28 DIAGNOSIS — I5021 Acute systolic (congestive) heart failure: Secondary | ICD-10-CM

## 2020-10-28 DIAGNOSIS — A419 Sepsis, unspecified organism: Secondary | ICD-10-CM | POA: Diagnosis not present

## 2020-10-28 LAB — COMPREHENSIVE METABOLIC PANEL
ALT: 30 U/L (ref 0–44)
AST: 35 U/L (ref 15–41)
Albumin: 1.5 g/dL — ABNORMAL LOW (ref 3.5–5.0)
Alkaline Phosphatase: 141 U/L — ABNORMAL HIGH (ref 38–126)
Anion gap: 8 (ref 5–15)
BUN: 15 mg/dL (ref 6–20)
CO2: 27 mmol/L (ref 22–32)
Calcium: 8.7 mg/dL — ABNORMAL LOW (ref 8.9–10.3)
Chloride: 108 mmol/L (ref 98–111)
Creatinine, Ser: 0.69 mg/dL (ref 0.61–1.24)
GFR, Estimated: >60 mL/min (ref >60)
Glucose, Bld: 154 mg/dL — ABNORMAL HIGH (ref 70–99)
Potassium: 3.5 mmol/L (ref 3.5–5.1)
Sodium: 143 mmol/L (ref 135–145)
Total Bilirubin: 0.9 mg/dL (ref 0.3–1.2)
Total Protein: 5.3 g/dL — ABNORMAL LOW (ref 6.5–8.1)

## 2020-10-28 LAB — CBC WITH DIFFERENTIAL/PLATELET
Abs Immature Granulocytes: 0 10*3/uL (ref 0.00–0.07)
Basophils Absolute: 0 10*3/uL (ref 0.0–0.1)
Basophils Relative: 0 %
Eosinophils Absolute: 0 10*3/uL (ref 0.0–0.5)
Eosinophils Relative: 0 %
HCT: 27.3 % — ABNORMAL LOW (ref 39.0–52.0)
Hemoglobin: 8.6 g/dL — ABNORMAL LOW (ref 13.0–17.0)
Lymphocytes Relative: 4 %
Lymphs Abs: 0.7 10*3/uL (ref 0.7–4.0)
MCH: 33.5 pg (ref 26.0–34.0)
MCHC: 31.5 g/dL (ref 30.0–36.0)
MCV: 106.2 fL — ABNORMAL HIGH (ref 80.0–100.0)
Monocytes Absolute: 0.8 10*3/uL (ref 0.1–1.0)
Monocytes Relative: 5 %
Neutro Abs: 15.2 10*3/uL — ABNORMAL HIGH (ref 1.7–7.7)
Neutrophils Relative %: 91 %
Platelets: 140 10*3/uL — ABNORMAL LOW (ref 150–400)
RBC: 2.57 MIL/uL — ABNORMAL LOW (ref 4.22–5.81)
RDW: 19.5 % — ABNORMAL HIGH (ref 11.5–15.5)
WBC: 16.7 10*3/uL — ABNORMAL HIGH (ref 4.0–10.5)
nRBC: 0 % (ref 0.0–0.2)
nRBC: 0 /100 WBC

## 2020-10-28 LAB — GLUCOSE, CAPILLARY
Glucose-Capillary: 146 mg/dL — ABNORMAL HIGH (ref 70–99)
Glucose-Capillary: 157 mg/dL — ABNORMAL HIGH (ref 70–99)
Glucose-Capillary: 174 mg/dL — ABNORMAL HIGH (ref 70–99)
Glucose-Capillary: 187 mg/dL — ABNORMAL HIGH (ref 70–99)
Glucose-Capillary: 208 mg/dL — ABNORMAL HIGH (ref 70–99)
Glucose-Capillary: 221 mg/dL — ABNORMAL HIGH (ref 70–99)

## 2020-10-28 LAB — ECHOCARDIOGRAM COMPLETE
Height: 69 in
S' Lateral: 3.5 cm
Weight: 3604.96 oz

## 2020-10-28 LAB — PHOSPHORUS: Phosphorus: 2.1 mg/dL — ABNORMAL LOW (ref 2.5–4.6)

## 2020-10-28 LAB — MAGNESIUM: Magnesium: 2 mg/dL (ref 1.7–2.4)

## 2020-10-28 LAB — PROCALCITONIN: Procalcitonin: 2.38 ng/mL

## 2020-10-28 LAB — BRAIN NATRIURETIC PEPTIDE: B Natriuretic Peptide: 300.8 pg/mL — ABNORMAL HIGH (ref 0.0–100.0)

## 2020-10-28 MED ORDER — SODIUM CHLORIDE 0.9 % IV SOLN
2.0000 g | Freq: Three times a day (TID) | INTRAVENOUS | Status: DC
  Administered 2020-10-28 – 2020-10-29 (×4): 2 g via INTRAVENOUS
  Filled 2020-10-28 (×10): qty 2

## 2020-10-28 MED ORDER — CHLORHEXIDINE GLUCONATE 0.12 % MT SOLN
15.0000 mL | Freq: Two times a day (BID) | OROMUCOSAL | Status: DC
  Administered 2020-10-28 – 2020-11-14 (×35): 15 mL via OROMUCOSAL
  Filled 2020-10-28 (×27): qty 15

## 2020-10-28 MED ORDER — POTASSIUM PHOSPHATES 15 MMOLE/5ML IV SOLN
15.0000 mmol | Freq: Once | INTRAVENOUS | Status: AC
  Administered 2020-10-28: 15 mmol via INTRAVENOUS
  Filled 2020-10-28: qty 5

## 2020-10-28 MED ORDER — ORAL CARE MOUTH RINSE
15.0000 mL | Freq: Two times a day (BID) | OROMUCOSAL | Status: DC
  Administered 2020-10-28 – 2020-11-14 (×31): 15 mL via OROMUCOSAL

## 2020-10-28 MED ORDER — METRONIDAZOLE 500 MG/100ML IV SOLN: 500.0000 mg | Freq: Two times a day (BID) | INTRAVENOUS | Status: DC

## 2020-10-28 MED ORDER — FUROSEMIDE 10 MG/ML IJ SOLN
40.0000 mg | Freq: Every day | INTRAMUSCULAR | Status: DC
  Administered 2020-10-28 – 2020-10-31 (×4): 40 mg via INTRAVENOUS
  Filled 2020-10-28 (×4): qty 4

## 2020-10-28 MED ORDER — TRAVASOL 10 % IV SOLN
INTRAVENOUS | Status: AC
  Filled 2020-10-28: qty 1320

## 2020-10-28 NOTE — Evaluation (Signed)
Clinical/Bedside Swallow Evaluation Patient Details  Name: Paul Chambers MRN: [id number] Date of Birth: [birth year]  Today's Date: 10/28/2020 Time: SLP Start Time (ACUTE ONLY): 0912 SLP Stop Time (ACUTE ONLY): 0935 SLP Time Calculation (min) (ACUTE ONLY): 23 min  Past Medical History:  Past Medical History:  Diagnosis Date   Alcoholism (HCC)    Gout    Hypertension    Pancreatitis    Tobacco abuse    Past Surgical History:  Past Surgical History:  Procedure Laterality Date   LAPAROSCOPY N/A 11/06/2020   Procedure: LAPAROSCOPY DIAGNOSTIC;  Surgeon: Rodman Pickle, MD;  Location: [facility] OR;  Service: General;  Laterality: N/A;   LAPAROTOMY N/A 11/10/2020   Procedure: EXPLORATORY LAPAROTOMY AND PANCREATIC DEBRIDEMENT;  Surgeon: Sheliah Hatch De Blanch, MD;  Location: [facility] OR;  Service: General;  Laterality: N/A;   HPI:  55 y.o. Caucasian male smoker presented to the [facility] Emergency Department via private vehicle with complaints of abdominal pain and altered mental status. 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 ETT 9/9 to 9/12. Pt with post op ileus. Pulled NG, SLP consulted for clinical swallow assessment with clear liquids.   Assessment / Plan / Recommendation Clinical Impression  Pt presents with concern for component of pharyngeal dysphagia likely in setting of deconditioning and post extubation (9/10-23-10). Pt cooperative with spouse at bedside. Some xerostomia noted, improving with oral care. Pt eager for ice chips. With single ice chips, initial trials were without overt s/sx of aspiration. Following 3-4 trials pt exhibted some delayed coughing with ice chips and small sips of water. Pt did have congested cough with productive expectoration of mucous, suctioned via yankauer. Recommend NPO with ice chips following oral care with full supervision with staff and spouse. SLP to follow up for PO readiness. Pt may need future instrumental swallow  assessment.  SLP Visit Diagnosis: Dysphagia, unspecified (R13.10)    Aspiration Risk  Moderate aspiration risk    Diet Recommendation   NPO; ice chips following oral care as tolerated  Medication Administration: Via alternative means    Other  Recommendations Oral Care Recommendations: Oral care prior to ice chip/H20;Oral care QID   Follow up Recommendations Other (comment) (TBD)      Frequency and Duration min 2x/week  2 weeks       Prognosis Prognosis for Safe Diet Advancement: Good Barriers to Reach Goals: Cognitive deficits;Time post onset      Swallow Study   General Date of Onset: 10/27/2020 HPI: 55 y.o. Caucasian male smoker presented to the [facility] Emergency Department via private vehicle with complaints of abdominal pain and altered mental status. 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 ETT 9/9 to 9/12. Pt with post op ileus. Pulled NG, SLP consulted for clinical swallow assessment with clear liquids. Type of Study: Bedside Swallow Evaluation Previous Swallow Assessment: none on file Diet Prior to this Study: NPO (TPN) Temperature Spikes Noted: No Respiratory Status: Nasal cannula History of Recent Intubation: Yes Length of Intubations (days): 3 days Date extubated: 10/25/20 Behavior/Cognition: Lethargic/Drowsy;Requires cueing;Cooperative Oral Cavity Assessment: Dry Oral Care Completed by SLP: Yes Oral Cavity - Dentition: Adequate natural dentition Vision: Functional for self-feeding Self-Feeding Abilities: Needs assist Patient Positioning: Upright in bed Baseline Vocal Quality: Low vocal intensity Volitional Cough: Congested Volitional Swallow: Able to elicit    Oral/Motor/Sensory Function Overall Oral Motor/Sensory Function: Generalized oral weakness   Ice Chips Ice chips: Impaired Presentation: Spoon Oral Phase Impairments: Reduced lingual movement/coordination  Oral Phase Functional Implications: Prolonged oral  transit Pharyngeal Phase Impairments: Suspected delayed Swallow;Decreased hyoid-laryngeal movement;Multiple swallows;Cough - Delayed   Thin Liquid Thin Liquid: Impaired Presentation: Cup Oral Phase Functional Implications: Prolonged oral transit Pharyngeal  Phase Impairments: Suspected delayed Swallow;Decreased hyoid-laryngeal movement;Multiple swallows;Cough - Delayed    Nectar Thick Nectar Thick Liquid: Not tested   Honey Thick Honey Thick Liquid: Not tested   Puree Puree: Not tested   Solid     Solid: Not tested      Ardyth Gal MA, CCC-SLP Acute Rehabilitation Services   10/28/2020,9:40 AM

## 2020-10-28 NOTE — Progress Notes (Addendum)
PROGRESS NOTE    Paul Chambers  QMV:[id number] DOB: [birth year] DOA: Nov 18, 2020 PCP: Pcp, No   Brief Narrative:  This 55 y.o. Caucasian male smoker presented to the [facility] Emergency Department via private vehicle with complaints of abdominal pain and altered mental status. The patient's wife reports that this is the third presentation to a healthcare facility in the past month for abdominal pain ([facility], [facility] and now [facility]).  The patient's deteriorating mental status over the past 2 days prompted the wife to force the patient to come to the hospital.  In the ER, CT head was negative for acute process, but CT abdomen showed an impressive pneumoperitoneum without an obvious location of perforation.  Surgery evaluated the patient and decided for exploratory laparotomy which was performed on 10/22/2020.  Sequence of events as below. Significant Hospital Events: Including procedures, antibiotic start and stop dates in addition to other pertinent events   9/8 - Presented to [facility] with abdominal pain. CT A/P with extensive pneumoperitoneum 9/9 - Ex-lap (no colonic perforation discovered, large necrotic fluid present surrounding pancreas. Transferred to ICU post-op, intubated and sedated.  9/10-received 1 unit PRBC for hemoglobin 6.7, went back on Levophed for short period of time 9/12 - Extubated to Venturi mask 9/13 - Off Fentanyl and Propofol gtt 10/28/20.  Patient transferred under TRH.  Assessment & Plan:   Principal Problem:   Severe sepsis (HCC) Active Problems:   Acute necrotizing pancreatitis   Toxic encephalopathy   Hyponatremia   Hypoalbuminemia   Macrocytic anemia   Pressure injury of skin   Protein-calorie malnutrition, severe   Infected Necrotizing Pancreatitis with Pneumoperitoneum s/p ex-lap by Dr. Drexel Iha on 10/22/2020: - General surgery following; appreciate their recommendations.  - Continue IV antibiotics with Zosyn and Fluconazole, 7  day planned - Discontinued Linezolid, as he has completed course.  - Continue TPN.  He pulled out his NG tube on 10/27/2020. Further management per general surgery.   Acute respiratory failure with hypoxia/possible aspiration pneumonia/congestive heart failure/left pleural effusion: Intubated 10/22/2020 and extubated 10/26/2020.  Currently on 4 L oxygen.  Patient has audible rhonchi and crackles bilaterally.  Repeat chest x-ray shows worsening/increased left lung opacity concerning for pneumonia or atelectasis and associated left pleural effusion.  His procalcitonin was elevated and was checked 6 days ago.  We will repeat procalcitonin as well as repeat BNP.  We will start him on Lasix 40 mg IV daily.  The worsening of x-ray and pneumonia is despite of him being on Zosyn.  Will transition to Merrem.  Continue supplemental oxygen and wean as able to.  Aspiration precautions.  SLP on board and he is only on ice chips currently.   Acute toxic-metabolic encephalopathy: Sepsis +/- ICU delirium: Wife at the bedside and primary nurse as well.  Patient currently fully alert and oriented although he is still in mittens.  Per wife, he was having some visual and auditory hallucination about an hour ago.  Continue delirium precautions.  Ileus: Resolving.  Managed by general surgery.   Acute hepatitis: Stable. 2/2 to TPN - CMP daily    Alcohol abuse: Out of the window for withdrawal complications.   - Continue thiamine and folate   Acute Blood Loss Anemia, postsurgical: S/p 1 unit of pRBCs on 9/10 with stabilization in hemoglobin since.  - Monitor H&H and transfuse if less than 7   Severe protein calorie malnutrition - Continue TPN   Diabetes type 2 with hyperglycemia: Hemoglobin A1c was 6.8% on admission.  Blood sugar controlled.  He is not on any Lantus.  Continue SSI.   Hypokalemia: Resolved.  Hypophosphatemia: Replenished.   DVT prophylaxis: heparin injection 5,000 Units Start: 10/22/20 1400 Place  and maintain sequential compression device Start: 2020-10-25 2202 SCDs Start: 10-25-2020 2145   Code Status: Full Code  Family Communication: Patient's wife present at bedside.  Plan of care discussed with patient and his wife  Status is: Inpatient  Remains inpatient appropriate because:Inpatient level of care appropriate due to severity of illness  Dispo: The patient is from: Home              Anticipated d/c is to: SNF              Patient currently is not medically stable to d/c.   Difficult to place patient No        Estimated body mass index is 33.27 kg/m as calculated from the following:   Height as of this encounter: 5\' 9"  (1.753 m).   Weight as of this encounter: 102.2 kg.     Nutritional Assessment: Body mass index is 33.27 kg/m. Seen by dietician.  I agree with the assessment and plan as outlined below: Nutrition Status: Nutrition Problem: Severe Malnutrition Etiology: acute illness (infected necrotic pancreatitis) Signs/Symptoms: moderate fat depletion, moderate muscle depletion, energy intake < or equal to 50% for > or equal to 5 days, percent weight loss (8.4% weight loss in 1.5 months) Percent weight loss: 8.4 % (1.5 months) Interventions: Refer to RD note for recommendations  .  Skin Assessment: I have examined the patient's skin and I agree with the wound assessment as performed by the wound care RN as outlined below:    Consultants:  Neurosurgery  Procedures:  As above  Antimicrobials:  Anti-infectives (From admission, onward)    Start     Dose/Rate Route Frequency Ordered Stop   10/25/20 2300  fluconazole (DIFLUCAN) IVPB 400 mg        400 mg 100 mL/hr over 120 Minutes Intravenous Every 24 hours 10/25/20 0742 10/29/20 2259   10/24/20 2300  fluconazole (DIFLUCAN) IVPB 200 mg  Status:  Discontinued        200 mg 100 mL/hr over 60 Minutes Intravenous Every 24 hours 10/24/20 0943 10/25/20 0742   10/22/20 1800  vancomycin (VANCOREADY) IVPB 1500  mg/300 mL  Status:  Discontinued        1,500 mg 150 mL/hr over 120 Minutes Intravenous Every 24 hours 2020-10-25 2225 10/22/20 0759   10/22/20 0900  linezolid (ZYVOX) IVPB 600 mg  Status:  Discontinued        600 mg 300 mL/hr over 60 Minutes Intravenous Every 12 hours 10/22/20 0800 10/27/20 0852   10/22/20 0300  piperacillin-tazobactam (ZOSYN) IVPB 3.375 g        3.375 g 12.5 mL/hr over 240 Minutes Intravenous Every 8 hours 10-25-20 2225 10/29/20 0559   10-25-2020 2335  metroNIDAZOLE (FLAGYL) IVPB 500 mg  Status:  Discontinued        500 mg 100 mL/hr over 60 Minutes Intravenous Every 12 hours 25-Oct-2020 2142 10/22/20 0747   Oct 25, 2020 2248  fluconazole (DIFLUCAN) IVPB 400 mg  Status:  Discontinued        400 mg 100 mL/hr over 120 Minutes Intravenous Every 24 hours 10/25/20 2142 10/24/20 0943   2020/10/25 1748  vancomycin (VANCOREADY) IVPB 2000 mg/400 mL        2,000 mg 200 mL/hr over 120 Minutes Intravenous  Once 10/25/2020 1706 25-Oct-2020 2113  10-22-2020 1715  piperacillin-tazobactam (ZOSYN) IVPB 3.375 g        3.375 g 100 mL/hr over 30 Minutes Intravenous  Once 10-22-20 1706 10/22/2020 1903          Subjective: Patient seen and examined.  Wife and primary RN at the bedside.  Patient alert and oriented but still with mittens.  Denies any complaint other than some back pain.  Objective: Vitals:   10/28/20 0800 10/28/20 0900 10/28/20 1000 10/28/20 1100  BP: 138/76 139/90 (!) 149/81 (!) 146/81  Pulse: 98 98 99 100  Resp: (!) 23 (!) 25 (!) 27 (!) 29  Temp:    98.3 F (36.8 C)  TempSrc:    Oral  SpO2: 100% 99% 99% 100%  Weight:      Height:        Intake/Output Summary (Last 24 hours) at 10/28/2020 1341 Last data filed at 10/28/2020 1100 Gross per 24 hour  Intake 2896.63 ml  Output 1853 ml  Net 1043.63 ml   Filed Weights   10/23/20 0200 10/24/20 0403 10/25/20 0402  Weight: 98.2 kg 100.6 kg 102.2 kg    Examination:  General exam: Appears calm and comfortable  Respiratory system:  Clear to auscultation. Respiratory effort normal. Cardiovascular system: S1 & S2 heard, RRR. No JVD, murmurs, rubs, gallops or clicks. No pedal edema. Gastrointestinal system: Abdomen is nondistended, soft and mild generalized tenderness. No organomegaly or masses felt. Normal bowel sounds heard. Central nervous system: Alert and oriented. No focal neurological deficits. Extremities: Symmetric 5 x 5 power. Skin: No rashes, lesions or ulcers  Data Reviewed: I have personally reviewed following labs and imaging studies  CBC: Recent Labs  Lab 10/26/20 0808 10/26/20 1056 10/26/20 1811 10/27/20 0500 10/28/20 0212  WBC 16.7* 15.4* 17.8* 16.5* 16.7*  NEUTROABS 15.0*  --   --  15.7* 15.2*  HGB 6.5* 6.5* 9.2* 8.2* 8.6*  HCT 20.4* 22.3* 28.3* 25.5* 27.3*  MCV 113.3* 129.7* 104.0* 104.1* 106.2*  PLT 150 139* 169 158 140*   Basic Metabolic Panel: Recent Labs  Lab 10/24/20 0349 10/24/20 1154 10/25/20 0328 10/26/20 0401 10/26/20 1811 10/27/20 0500 10/28/20 0212  NA 128*   < > 132* 135 136 138 143  K 4.7   < > 4.5 3.5 3.5 3.3* 3.5  CL 92*  --  97* 101 101 104 108  CO2 24  --  25 25 24 25 27   GLUCOSE 285*  --  199* 123* 223* 252* 154*  BUN 51*  --  51* 37* 27* 22* 15  CREATININE 2.18*  --  1.76* 1.12 0.90 0.87 0.69  CALCIUM 7.8*  --  8.0* 8.1* 8.3* 8.3* 8.7*  MG 2.6*  --  2.4 2.2  --  2.1 2.0  PHOS 6.3*  --  4.0 2.3*  --  2.6 2.1*   < > = values in this interval not displayed.   GFR: Estimated Creatinine Clearance: 127.3 mL/min (by C-G formula based on SCr of 0.69 mg/dL). Liver Function Tests: Recent Labs  Lab 10/24/20 0349 10/25/20 0328 10/26/20 0401 10/27/20 0500 10/28/20 0212  AST 56* 52* 84* 44* 35  ALT 36 33 46* 35 30  ALKPHOS 112 113 190* 130* 141*  BILITOT 1.2 1.0 1.0 1.0 0.9  PROT 5.2* 5.3* 5.3* 5.3* 5.3*  ALBUMIN 2.0* 1.7* 1.5* 1.5* 1.5*   Recent Labs  Lab 10-22-20 1506  LIPASE 25   Recent Labs  Lab October 22, 2020 2213  AMMONIA 37*   Coagulation  Profile: Recent Labs  Lab 11/12/2020 2102 10/22/20 0357 10/22/20 0826  INR 2.1* 2.1* 2.0*   Cardiac Enzymes: Recent Labs  Lab 10/24/2020 1550  CKTOTAL 27*   BNP (last 3 results) No results for input(s): PROBNP in the last 8760 hours. HbA1C: No results for input(s): HGBA1C in the last 72 hours. CBG: Recent Labs  Lab 10/27/20 1938 10/27/20 2336 10/28/20 0351 10/28/20 0727 10/28/20 1141  GLUCAP 180* 160* 157* 146* 174*   Lipid Profile: No results for input(s): CHOL, HDL, LDLCALC, TRIG, CHOLHDL, LDLDIRECT in the last 72 hours. Thyroid Function Tests: No results for input(s): TSH, T4TOTAL, FREET4, T3FREE, THYROIDAB in the last 72 hours. Anemia Panel: No results for input(s): VITAMINB12, FOLATE, FERRITIN, TIBC, IRON, RETICCTPCT in the last 72 hours. Sepsis Labs: Recent Labs  Lab 10/14/2020 2213 10/22/20 0357 10/22/20 8295 10/22/20 0826 10/22/20 1304 10/23/20 0910  PROCALCITON 16.09 21.00  --   --   --   --   LATICACIDVEN 2.0*  --  3.8* 4.9* 3.5* 1.8    Recent Results (from the past 240 hour(s))  Resp Panel by RT-PCR (Flu A&B, Covid) Nasopharyngeal Swab     Status: None   Collection Time: 10/24/2020  8:49 PM   Specimen: Nasopharyngeal Swab; Nasopharyngeal(NP) swabs in vial transport medium  Result Value Ref Range Status   SARS Coronavirus 2 by RT PCR NEGATIVE NEGATIVE Final    Comment: (NOTE) SARS-CoV-2 target nucleic acids are NOT DETECTED.  The SARS-CoV-2 RNA is generally detectable in upper respiratory specimens during the acute phase of infection. The lowest concentration of SARS-CoV-2 viral copies this assay can detect is 138 copies/mL. A negative result does not preclude SARS-Cov-2 infection and should not be used as the sole basis for treatment or other patient management decisions. A negative result may occur with  improper specimen collection/handling, submission of specimen other than nasopharyngeal swab, presence of viral mutation(s) within the areas  targeted by this assay, and inadequate number of viral copies(<138 copies/mL). A negative result must be combined with clinical observations, patient history, and epidemiological information. The expected result is Negative.  Fact Sheet for Patients:  BloggerCourse.com  Fact Sheet for Healthcare Providers:  SeriousBroker.it  This test is no t yet approved or cleared by the [place] FDA and  has been authorized for detection and/or diagnosis of SARS-CoV-2 by FDA under an Emergency Use Authorization (EUA). This EUA will remain  in effect (meaning this test can be used) for the duration of the COVID-19 declaration under Section 564(b)(1) of the Act, 21 U.S.C.section 360bbb-3(b)(1), unless the authorization is terminated  or revoked sooner.       Influenza A by PCR NEGATIVE NEGATIVE Final   Influenza B by PCR NEGATIVE NEGATIVE Final    Comment: (NOTE) The Xpert Xpress SARS-CoV-2/FLU/RSV plus assay is intended as an aid in the diagnosis of influenza from Nasopharyngeal swab specimens and should not be used as a sole basis for treatment. Nasal washings and aspirates are unacceptable for Xpert Xpress SARS-CoV-2/FLU/RSV testing.  Fact Sheet for Patients: BloggerCourse.com  Fact Sheet for Healthcare Providers: SeriousBroker.it  This test is not yet approved or cleared by the [place] FDA and has been authorized for detection and/or diagnosis of SARS-CoV-2 by FDA under an Emergency Use Authorization (EUA). This EUA will remain in effect (meaning this test can be used) for the duration of the COVID-19 declaration under Section 564(b)(1) of the Act, 21 U.S.C. section 360bbb-3(b)(1), unless the authorization is terminated or revoked.  Performed at [facility], [address] [place], [place] [address]   Culture, blood (routine x 2)     Status: None   Collection Time:  10/23/2020 10:17 PM   Specimen: BLOOD  Result Value Ref Range Status   Specimen Description BLOOD LEFT ANTECUBITAL  Final   Special Requests   Final    BOTTLES DRAWN AEROBIC AND ANAEROBIC Blood Culture adequate volume   Culture   Final    NO GROWTH 5 DAYS Performed at [facility], [address], [place], [place] [address]    Report Status 10/26/2020 FINAL  Final  MRSA Next Gen by PCR, Nasal     Status: None   Collection Time: 10/22/20  2:00 AM  Result Value Ref Range Status   MRSA by PCR Next Gen NOT DETECTED NOT DETECTED Final    Comment: (NOTE) The GeneXpert MRSA Assay (FDA approved for NASAL specimens only), is one component of a comprehensive MRSA colonization surveillance program. It is not intended to diagnose MRSA infection nor to guide or monitor treatment for MRSA infections. Test performance is not FDA approved in patients less than 75 years old. Performed at [facility], [address] [place], [place] [address]   Culture, blood (routine x 2)     Status: None   Collection Time: 10/22/20  3:52 AM   Specimen: BLOOD  Result Value Ref Range Status   Specimen Description BLOOD LEFT ANTECUBITAL  Final   Special Requests   Final    BOTTLES DRAWN AEROBIC AND ANAEROBIC Blood Culture adequate volume   Culture   Final    NO GROWTH 5 DAYS Performed at [facility], [address], [place], [place] [address]    Report Status 10/27/2020 FINAL  Final      Radiology Studies: DG CHEST PORT 1 VIEW  Result Date: 10/28/2020 CLINICAL DATA:  Dyspnea. EXAM: PORTABLE CHEST 1 VIEW COMPARISON:  October 22, 2020. FINDINGS: Stable cardiomegaly. Interval placement of right-sided PICC line with distal tip in expected position of cavoatrial junction. Right lung is clear. Endotracheal and nasogastric tubes have been removed. Left midlung and basilar opacity is noted concerning for pneumonia or atelectasis with associated pleural effusion. Bony thorax is unremarkable.  IMPRESSION: Increased left lung opacity is noted concerning for pneumonia or atelectasis with associated left pleural effusion. Electronically Signed   By: Lupita Raider M.D.   On: 10/28/2020 12:33   DG Abd Portable 1V  Result Date: 10/26/2020 CLINICAL DATA:  NG tube placement. EXAM: PORTABLE ABDOMEN - 1 VIEW COMPARISON:  Abdominal x-ray 10/25/2020. FINDINGS: Enteric tube tip terminates in the mid stomach. Lines overlie the mid abdomen, unchanged. No dilated bowel loops are visualized. IMPRESSION: 1. Enteric tube tip terminates in the mid stomach. Electronically Signed   By: Darliss Cheney M.D.   On: 10/26/2020 19:23    Scheduled Meds:  chlorhexidine  15 mL Mouth Rinse BID   Chlorhexidine Gluconate Cloth  6 each Topical Q0600   fluticasone furoate-vilanterol  1 puff Inhalation Daily   And   umeclidinium bromide  1 puff Inhalation Daily   heparin  5,000 Units Subcutaneous Q8H   insulin aspart  0-20 Units Subcutaneous Q4H   mouth rinse  15 mL Mouth Rinse q12n4p   pantoprazole (PROTONIX) IV  40 mg Intravenous Q24H   sodium chloride flush  10-40 mL Intracatheter Q12H   sodium chloride flush  10-40 mL Intracatheter Q12H   Continuous Infusions:  sodium chloride Stopped (10/27/20 1129)   fluconazole (DIFLUCAN)  IV Stopped (10/28/20 0148)   piperacillin-tazobactam (ZOSYN)  IV Stopped (10/28/20 0902)   TPN ADULT (ION) 100 mL/hr at 10/28/20 1100   TPN ADULT (ION)       LOS: 7 days   Time spent: 38 minutes   Hughie Closs, MD [facility]  10/28/2020, 1:41 PM  Please page via Amion and do not message via secure chat for anything urgent. Secure chat can be used for anything non urgent and I will respond at my earliest availability.  How to contact the [facility] Attending or Consulting provider 7A - 7P or covering provider during after hours 7P -7A, for this patient?  Check the care team in [facility] and look for a) attending/consulting [facility] provider listed and b) the [facility] team listed. Page or secure  chat 7A-7P. Log into [email or web address] and use [place]'s universal password to access. If you do not have the password, please contact the hospital operator. Locate the [facility] provider you are looking for under [facility] and page to a number that you can be directly reached. If you still have difficulty reaching the provider, please page the [facility] (Director on Call) for the Hospitalists listed on amion for assistance.

## 2020-10-28 NOTE — Progress Notes (Signed)
  Echocardiogram 2D Echocardiogram has been performed.  Delcie Roch 10/28/2020, 5:16 PM

## 2020-10-28 NOTE — Progress Notes (Signed)
[facility] Progress Note  7 Days Post-Op  Subjective: CC-  Patient pulled out NG yesterday afternoon. Denies n/v. States that his abdomen is sore but denies worsening pain. Passing flatus and had another BM this morning. Tolerating ice chips. WBC stable elevated at 16.7, TMAX 99.9  Objective: Vital signs in last 24 hours: Temp:  [98.4 F (36.9 C)-99.9 F (37.7 C)] 98.8 F (37.1 C) (09/15 0700) Pulse Rate:  [97-114] 97 (09/15 0600) Resp:  [20-34] 29 (09/15 0600) BP: (121-154)/(77-101) 147/84 (09/15 0600) SpO2:  [89 %-100 %] 98 % (09/15 0600) Last BM Date: 10/27/20  Intake/Output from previous day: 09/14 0701 - 09/15 0700 In: 2683.6 [I.V.:2027.9; IV Piggyback:655.8] Out: 2468 [Urine:2175; Drains:293] Intake/Output this shift: No intake/output data recorded.  PE: Gen:  Awake and alert, NAD Pulm:  Normal rate and effort  Abd: Distended but soft, generalized tenderness without rigidity or guarding. +bowel sounds. RUQ drains x2 with brown fluid in bulbs. Midline wound pale pink and clean without dehiscence.  78F - 265 92F - 28  Lab Results:  Recent Labs    10/27/20 0500 10/28/20 0212  WBC 16.5* 16.7*  HGB 8.2* 8.6*  HCT 25.5* 27.3*  PLT 158 140*   BMET Recent Labs    10/27/20 0500 10/28/20 0212  NA 138 143  K 3.3* 3.5  CL 104 108  CO2 25 27  GLUCOSE 252* 154*  BUN 22* 15  CREATININE 0.87 0.69  CALCIUM 8.3* 8.7*   PT/INR No results for input(s): LABPROT, INR in the last 72 hours. CMP     Component Value Date/Time   NA 143 10/28/2020 0212   K 3.5 10/28/2020 0212   CL 108 10/28/2020 0212   CO2 27 10/28/2020 0212   GLUCOSE 154 (H) 10/28/2020 0212   BUN 15 10/28/2020 0212   CREATININE 0.69 10/28/2020 0212   CALCIUM 8.7 (L) 10/28/2020 0212   PROT 5.3 (L) 10/28/2020 0212   ALBUMIN 1.5 (L) 10/28/2020 0212   AST 35 10/28/2020 0212   ALT 30 10/28/2020 0212   ALKPHOS 141 (H) 10/28/2020 0212   BILITOT 0.9 10/28/2020 0212   GFRNONAA >60  10/28/2020 0212   Lipase     Component Value Date/Time   LIPASE 25 October 29, 2020 1506       Studies/Results: DG Abd Portable 1V  Result Date: 10/26/2020 CLINICAL DATA:  NG tube placement. EXAM: PORTABLE ABDOMEN - 1 VIEW COMPARISON:  Abdominal x-ray 10/25/2020. FINDINGS: Enteric tube tip terminates in the mid stomach. Lines overlie the mid abdomen, unchanged. No dilated bowel loops are visualized. IMPRESSION: 1. Enteric tube tip terminates in the mid stomach. Electronically Signed   By: Darliss Cheney M.D.   On: 10/26/2020 19:23    Anti-infectives: Anti-infectives (From admission, onward)    Start     Dose/Rate Route Frequency Ordered Stop   10/25/20 2300  fluconazole (DIFLUCAN) IVPB 400 mg        400 mg 100 mL/hr over 120 Minutes Intravenous Every 24 hours 10/25/20 0742 10/29/20 2259   10/24/20 2300  fluconazole (DIFLUCAN) IVPB 200 mg  Status:  Discontinued        200 mg 100 mL/hr over 60 Minutes Intravenous Every 24 hours 10/24/20 0943 10/25/20 0742   10/22/20 1800  vancomycin (VANCOREADY) IVPB 1500 mg/300 mL  Status:  Discontinued        1,500 mg 150 mL/hr over 120 Minutes Intravenous Every 24 hours 10-29-20 2225 10/22/20 0759   10/22/20 0900  linezolid (ZYVOX) IVPB 600 mg  Status:  Discontinued        600 mg 300 mL/hr over 60 Minutes Intravenous Every 12 hours 10/22/20 0800 10/27/20 0852   10/22/20 0300  piperacillin-tazobactam (ZOSYN) IVPB 3.375 g        3.375 g 12.5 mL/hr over 240 Minutes Intravenous Every 8 hours 11/02/2020 2225 10/29/20 0559   11/01/2020 2335  metroNIDAZOLE (FLAGYL) IVPB 500 mg  Status:  Discontinued        500 mg 100 mL/hr over 60 Minutes Intravenous Every 12 hours 11/07/2020 2142 10/22/20 0747   11/03/2020 2248  fluconazole (DIFLUCAN) IVPB 400 mg  Status:  Discontinued        400 mg 100 mL/hr over 120 Minutes Intravenous Every 24 hours 11/06/2020 2142 10/24/20 0943   10/16/2020 1748  vancomycin (VANCOREADY) IVPB 2000 mg/400 mL        2,000 mg 200 mL/hr over 120  Minutes Intravenous  Once 11/02/2020 1706 11/05/2020 2113   11/09/2020 1715  piperacillin-tazobactam (ZOSYN) IVPB 3.375 g        3.375 g 100 mL/hr over 30 Minutes Intravenous  Once 10/30/2020 1706 10/28/2020 1903        Assessment/Plan POD 6 s/p ex lap with pancreatic debridement for infected necrotic pancreatitis by Dr. Sheliah Hatch on 9/9 - WBC stable from 33.7 > 25.7 > 16.7 > 16.5 > 16.7, TMAX 99.9. Cont abx - Cont drains and monitor - Superior drain under the mesocolon along with the head of the pancreas - Inferior drain under the mesocolon along the left upper abdomen and what is likely a position inferior to the pancreas - Ileus appears to be improving. Ok to keep NG tube out. Will ask SLP to see for swallow eval, if he passes then ok for CLD from our standpoint - Cont TPN for nutrition - PCA for pain - May plan for repeat CT scan tomorrow.   FEN: TPN, ice chips ID: [email or web address], fluconazole VTE: heparin subq Foley - out, voiding    - Per primary -  VDRF - now extubated Abl anemia - s/p 1U PRBC 9/13 w/ appropriate response. Hgb 8.6 ETOH use - weaned off precedex. Consider CIWA AKI - resolved, good uop T2DM   LOS: 7 days    Franne Forts, [facility] 10/28/2020, 8:29 AM Please see Amion for pager number during day hours 7:00am-4:30pm

## 2020-10-28 NOTE — Progress Notes (Signed)
[facility] ADULT ICU REPLACEMENT PROTOCOL   The patient does apply for the [facility] Adult ICU Electrolyte Replacment Protocol based on the criteria listed below:   1.Exclusion criteria: TCTS patients, ECMO patients and Hypothermia Protocol, and   Dialysis patients 2. Is GFR >/= 30 ml/min? Yes.    Patient's GFR today is >60 3. Is SCr </= 2? No. Patient's SCr is 0.69 mg/dL 4. Did SCr increase >/= 0.5 in 24 hours? No. 5.Pt's weight >40kg  Yes.   6. Abnormal electrolyte(s): K+ 3.5, phos 2.1  7. Electrolytes replaced per protocol 8.  Call MD STAT for K+ </= 2.5, Phos </= 1, or Mag </= 1 Physician:  n/a  Melvern Banker 10/28/2020 3:54 AM

## 2020-10-28 NOTE — Progress Notes (Signed)
PHARMACY - TOTAL PARENTERAL NUTRITION CONSULT NOTE   Indication:  severe pancreatitis  Patient Measurements: Height: 5\' 9"  (175.3 cm) Weight: 102.2 kg (225 lb 5 oz) IBW/kg (Calculated) : 70.7 TPN AdjBW (KG): 78 Body mass index is 33.27 kg/m.  Assessment: 45 yom presenting 9/8 with septic shock due to necrotizing pancreatitis with pneumoperitoneum s/p ex-lap 9/9. Pt transferred to ICU post-op, intubated and sedated. Pharmacy consulted to start TPN for severe pancreatitis. Pt at risk of refeeding with minimal PO intake x 1 week PTA, decreased PO intake x 1.5 months PTA with significant weight loss and hx heavy alcohol abuse.  Glucose / Insulin: A1c 6.8 (no meds pta). CBGs improving with increase of insulin in TPN (140-160). Utilized 27 units mSSI in last 24hrs and 45 units in TPN.  Electrolytes: Na up to 143 (124 on admit), Cl 108, K 3.5 and Phos 2.1 (KPHos 11/9 x1 given; will increase in TPN), others WNL Renal: AKI resolved - SCr 0.87, BUN down 22 Hepatic: LFTs / Tbili normalized, TG WNL, albumin 1.5; Triglycerides 97 Intake / Output; MIVF: NGT clamped 9/14, drain output down 266ml/24hrs, UOP 1.1 ml/kg/hr. LBM 9/14 x2.  GI Imaging: 9/8 CT A/P - extensive pneumoperitoneum GI Surgeries / Procedures: 9/9 ex-lap (no colonic perforation, large necrotic fluid present surrounding pancreas)  Central access: CVC 9/9 TPN start date: 9/10  Nutritional Goals: Goal TPN rate is 100 mL/hr (provides 132 g of protein and 2390 kcals per day) meeting 100% of needs  RD Assessment: Estimated Needs Total Energy Estimated Needs: 2350-2550 Total Protein Estimated Needs: 125-145 grams Total Fluid Estimated Needs: >/= 2.0 L  Current Nutrition:  NPO and TPN  Plan:  Continue TPN to 100 mL/hr at 1800  Electrolytes in TPN: change Na to 72mEq/L, increase K to 35 meq/L; Phos 10 meq/L, Ca 41mEq/L, Mg 5 mEq/L. Cl:Ac to 1:1  Add standard MVI and trace elements + folic acid 1mg  and thiamine 100mg  to  TPN Continue Resistant q4h SSI Increase Insulin to 60 units in TPN Monitor TPN labs F/u Surgery plans - NGT removed by patient. Plan for CT scan tomorrow, follow-up  ability to start enteral feeding   4m, PharmD, BCPS, BCCCP Clinical Pharmacist Please refer to [facility] for [facility] Pharmacy numbers 10/28/2020, 7:53 AM

## 2020-10-28 NOTE — Progress Notes (Signed)
Pharmacy Antibiotic Note  Paul Chambers is a 55 y.o. male admitted on 10/24/2020 with  infected necrotic pancreatitis and concern for possible pneumonia .  Pharmacy has been consulted for Merrem dosing. Zosyn discontinued (6 days of therapy). Fluconazole ordered through 9/16. Leukocytosis - stable at 16.7. Afebrile. Surgery note states possible plan for repeat CT scan of abdomen tomorrow.   Plan: Merrem 2g IV every 8 hours.  Continue Fluconazole 400mg  IV every 24 hours.  Monitor renal function, clinical status, and culture results.   Height: 5\' 9"  (175.3 cm) Weight: 102.2 kg (225 lb 5 oz) IBW/kg (Calculated) : 70.7  Temp (24hrs), Avg:98.6 F (37 C), Min:98.3 F (36.8 C), Max:98.8 F (37.1 C)  Recent Labs  Lab 10/26/2020 2213 10/22/20 0357 10/22/20 12/22/20 10/22/20 0826 10/22/20 1304 10/22/20 1753 10/23/20 0910 10/23/20 1210 10/25/20 0328 10/26/20 0401 10/26/20 0808 10/26/20 1056 10/26/20 1811 10/27/20 0500 10/28/20 0212  WBC  --    < >  --  53.9*  --    < >  --    < > 25.7*  --  16.7* 15.4* 17.8* 16.5* 16.7*  CREATININE  --    < >  --  1.86*  --    < >  --    < > 1.76* 1.12  --   --  0.90 0.87 0.69  LATICACIDVEN 2.0*  --  3.8* 4.9* 3.5*  --  1.8  --   --   --   --   --   --   --   --    < > = values in this interval not displayed.    Estimated Creatinine Clearance: 127.3 mL/min (by C-G formula based on SCr of 0.69 mg/dL).    No Known Allergies  Antimicrobials this admission: Vanco 9/8 >> 9/9 Zosyn 9/8 >>9/15 Metronidazole 9/8 >> 9/9 Fluconazole 9/8 >> (9/16) Linezolid 9/9>>9/14 Merrem 9/15 >>  Microbiology results: 9/9 MRSA PCR negative 9/8 Blood culture: negative 9/9 Blood culture: negative 9/15 Sputum culture- sent  Thank you for allowing pharmacy to be a part of this patient's care.  11/9, PharmD, BCPS, BCCCP Clinical Pharmacist Please refer to [facility] for [facility] Pharmacy numbers 10/28/2020 3:18 PM

## 2020-10-29 ENCOUNTER — Inpatient Hospital Stay (HOSPITAL_COMMUNITY): Payer: BC Managed Care – PPO

## 2020-10-29 DIAGNOSIS — R6520 Severe sepsis without septic shock: Secondary | ICD-10-CM | POA: Diagnosis not present

## 2020-10-29 DIAGNOSIS — A419 Sepsis, unspecified organism: Secondary | ICD-10-CM | POA: Diagnosis not present

## 2020-10-29 LAB — CBC WITH DIFFERENTIAL/PLATELET
Abs Immature Granulocytes: 0.09 10*3/uL — ABNORMAL HIGH (ref 0.00–0.07)
Basophils Absolute: 0 10*3/uL (ref 0.0–0.1)
Basophils Relative: 0 %
Eosinophils Absolute: 0 10*3/uL (ref 0.0–0.5)
Eosinophils Relative: 0 %
HCT: 26.8 % — ABNORMAL LOW (ref 39.0–52.0)
Hemoglobin: 8.6 g/dL — ABNORMAL LOW (ref 13.0–17.0)
Immature Granulocytes: 1 %
Lymphocytes Relative: 8 %
Lymphs Abs: 1 10*3/uL (ref 0.7–4.0)
MCH: 34 pg (ref 26.0–34.0)
MCHC: 32.1 g/dL (ref 30.0–36.0)
MCV: 105.9 fL — ABNORMAL HIGH (ref 80.0–100.0)
Monocytes Absolute: 1.1 10*3/uL — ABNORMAL HIGH (ref 0.1–1.0)
Monocytes Relative: 9 %
Neutro Abs: 10.3 10*3/uL — ABNORMAL HIGH (ref 1.7–7.7)
Neutrophils Relative %: 82 %
Platelets: 114 10*3/uL — ABNORMAL LOW (ref 150–400)
RBC: 2.53 MIL/uL — ABNORMAL LOW (ref 4.22–5.81)
RDW: 18.7 % — ABNORMAL HIGH (ref 11.5–15.5)
WBC: 12.4 10*3/uL — ABNORMAL HIGH (ref 4.0–10.5)
nRBC: 0.2 % (ref 0.0–0.2)

## 2020-10-29 LAB — GLUCOSE, CAPILLARY
Glucose-Capillary: 162 mg/dL — ABNORMAL HIGH (ref 70–99)
Glucose-Capillary: 173 mg/dL — ABNORMAL HIGH (ref 70–99)
Glucose-Capillary: 172 mg/dL — ABNORMAL HIGH (ref 70–99)
Glucose-Capillary: 179 mg/dL — ABNORMAL HIGH (ref 70–99)
Glucose-Capillary: 184 mg/dL — ABNORMAL HIGH (ref 70–99)
Glucose-Capillary: 190 mg/dL — ABNORMAL HIGH (ref 70–99)

## 2020-10-29 LAB — COMPREHENSIVE METABOLIC PANEL
ALT: 28 U/L (ref 0–44)
AST: 28 U/L (ref 15–41)
Albumin: 1.5 g/dL — ABNORMAL LOW (ref 3.5–5.0)
Alkaline Phosphatase: 147 U/L — ABNORMAL HIGH (ref 38–126)
Anion gap: 6 (ref 5–15)
BUN: 13 mg/dL (ref 6–20)
CO2: 29 mmol/L (ref 22–32)
Calcium: 8.7 mg/dL — ABNORMAL LOW (ref 8.9–10.3)
Chloride: 108 mmol/L (ref 98–111)
Creatinine, Ser: 0.57 mg/dL — ABNORMAL LOW (ref 0.61–1.24)
GFR, Estimated: >60 mL/min (ref >60)
Glucose, Bld: 177 mg/dL — ABNORMAL HIGH (ref 70–99)
Potassium: 3.7 mmol/L (ref 3.5–5.1)
Sodium: 143 mmol/L (ref 135–145)
Total Bilirubin: 0.6 mg/dL (ref 0.3–1.2)
Total Protein: 5.6 g/dL — ABNORMAL LOW (ref 6.5–8.1)

## 2020-10-29 MED ORDER — SODIUM CHLORIDE 0.9 % IV SOLN
1.0000 g | Freq: Three times a day (TID) | INTRAVENOUS | Status: DC
  Administered 2020-10-29 – 2020-11-01 (×8): 1 g via INTRAVENOUS
  Filled 2020-10-29 (×9): qty 1

## 2020-10-29 MED ORDER — TRAVASOL 10 % IV SOLN
INTRAVENOUS | Status: AC
  Filled 2020-10-29: qty 1320

## 2020-10-29 MED ORDER — ACETAMINOPHEN 650 MG RE SUPP
650.0000 mg | RECTAL | Status: DC | PRN
  Administered 2020-10-29 – 2020-10-30 (×2): 650 mg via RECTAL
  Filled 2020-10-29 (×2): qty 1

## 2020-10-29 MED ORDER — IOHEXOL 300 MG/ML  SOLN
100.0000 mL | Freq: Once | INTRAMUSCULAR | Status: AC | PRN
  Administered 2020-10-29: 100 mL via INTRAVENOUS

## 2020-10-29 MED ORDER — IOHEXOL 9 MG/ML PO SOLN
ORAL | Status: AC
  Administered 2020-10-29 (×2): 500 mL
  Filled 2020-10-29: qty 1000

## 2020-10-29 MED ORDER — ONDANSETRON HCL 4 MG/2ML IJ SOLN
4.0000 mg | Freq: Four times a day (QID) | INTRAMUSCULAR | Status: DC | PRN
  Administered 2020-10-29: 4 mg via INTRAVENOUS
  Filled 2020-10-29: qty 2

## 2020-10-29 NOTE — Progress Notes (Signed)
SLP Cancellation Note  Patient Details Name: Paul Chambers MRN: [id number] DOB: 1965/12/06   Cancelled treatment:       Reason Eval/Treat Not Completed: PA reporting pt more distended today with CT scan abdomen/pelvis to be ordered. Pt is to remain NPO for now and PA/RN requesting SLP f/u later today after CT if schedule allows.     Avie Echevaria, MA, CCC-SLP Acute Rehabilitation Services Office Number: [phone]  [name] 10/29/2020, 10:50 AM

## 2020-10-29 NOTE — Evaluation (Signed)
Physical Therapy Evaluation Patient Details Name: Paul Chambers MRN: [id number] DOB: Mar 31, 1965 Today's Date: 10/29/2020  History of Present Illness  Pt adm 9/8 with abdominal pain and AMS. Pt found to have extensive pneumoperitoneum and underwent ex lap on 9/9. Pt with infected necrotizing pancreatitis. Pt with acute hypoxic respiratory failure. Pt intubated 9/9-9/12. PMH - etoh, HTN, gout, pancreatitis.  Clinical Impression  Pt presents to PT with significant decline in mobility from baseline due to decr strength, decr cognition, decr balance, and decr activity tolerance. Feel pt will make good progress as long as medical status continues to improve. Wife is supportive and feel he could benefit from CIR prior to return home.        Recommendations for follow up therapy are one component of a multi-disciplinary discharge planning process, led by the attending physician.  Recommendations may be updated based on patient status, additional functional criteria and insurance authorization.  Follow Up Recommendations CIR    Equipment Recommendations  Other (comment) (To be determined)    Recommendations for Other Services Rehab consult     Precautions / Restrictions Precautions Precautions: Fall;Other (comment) Precaution Comments: abdominal drains      Mobility  Bed Mobility Overal bed mobility: Needs Assistance Bed Mobility: Supine to Sit     Supine to sit: +2 for physical assistance;Max assist;HOB elevated     General bed mobility comments: Assist to bring legs off of bed, elevate trunk into sitting and bring hips to EOB.    Transfers Overall transfer level: Needs assistance Equipment used: Rolling walker (2 wheeled) Transfers: Sit to/from [employer] Sit to Stand: +2 physical assistance;Mod assist Stand pivot transfers: +2 physical assistance;Mod assist       General transfer comment: Assist to bring hips up and for balance. Bed to chair with short  shuffling steps with rolling walker  Ambulation/Gait             General Gait Details: Only stand pivot to chair  Stairs            Wheelchair Mobility    Modified Rankin (Stroke Patients Only)       Balance Overall balance assessment: Needs assistance Sitting-balance support: Bilateral upper extremity supported Sitting balance-Leahy Scale: Poor Sitting balance - Comments: UE support and min  to min guard sitting EOB.   Standing balance support: Bilateral upper extremity supported Standing balance-Leahy Scale: Poor Standing balance comment: walker and +2 min assist for static stand x 60 sec                             Pertinent Vitals/Pain Pain Assessment: 0-10 Pain Score: 10-Worst pain ever Pain Location: lower back Pain Descriptors / Indicators: Sharp Pain Intervention(s): Limited activity within patient's tolerance;Repositioned;Monitored during session    Home Living Family/patient expects to be discharged to:: Private residence Living Arrangements: Spouse/significant other Available Help at Discharge: Family;Available 24 hours/day Type of Home: Mobile home Home Access: Stairs to enter Entrance Stairs-Rails: Right Entrance Stairs-Number of Steps: 4 Home Layout: One level Home Equipment: Walker - 4 wheels      Prior Function Level of Independence: Independent         Comments: Using rollator just prior to admission. Works an [employer]   Dominant Hand: Right    Extremity/Trunk Assessment   Upper Extremity Assessment Upper Extremity Assessment: Defer to OT evaluation    Lower Extremity Assessment Lower Extremity Assessment: Generalized weakness  Communication   Communication: Expressive difficulties (mumbles)  Cognition Arousal/Alertness: Lethargic Behavior During Therapy: Flat affect;Impulsive Overall Cognitive Status: Impaired/Different from baseline Area of Impairment:  Orientation;Attention;Following commands;Problem solving;Safety/judgement;Awareness                 Orientation Level: Place;Time;Situation Current Attention Level: Sustained   Following Commands: Follows one step commands with increased time Safety/Judgement: Decreased awareness of safety;Decreased awareness of deficits Awareness: Intellectual Problem Solving: Slow processing;Difficulty sequencing;Requires verbal cues;Requires tactile cues General Comments: At times pt awake and conversing but quickly falls asleep if not stimulated. Pt impulsive in wanting to stand to go to the bathroom despite multiple cues that condom cath in place.      General Comments      Exercises     Assessment/Plan    PT Assessment Patient needs continued PT services  PT Problem List Decreased strength;Decreased balance;Decreased cognition;Decreased knowledge of precautions;Decreased mobility;Decreased knowledge of use of DME;Decreased activity tolerance;Decreased safety awareness       PT Treatment Interventions DME instruction;Functional mobility training;Balance training;Patient/family education;Gait training;Therapeutic activities;Stair training;Therapeutic exercise;Cognitive remediation    PT Goals (Current goals can be found in the Care Plan section)  Acute Rehab PT Goals Patient Stated Goal: go home PT Goal Formulation: With patient/family Time For Goal Achievement: 11/12/20 Potential to Achieve Goals: Good    Frequency Min 3X/week   Barriers to discharge Inaccessible home environment stairs to enter    Co-evaluation PT/OT/SLP Co-Evaluation/Treatment: Yes Reason for Co-Treatment: For patient/therapist safety;Complexity of the patient's impairments (multi-system involvement) PT goals addressed during session: Mobility/safety with mobility;Balance         AM-PAC PT "6 Clicks" Mobility  Outcome Measure Help needed turning from your back to your side while in a flat bed without  using bedrails?: A Lot Help needed moving from lying on your back to sitting on the side of a flat bed without using bedrails?: Total Help needed moving to and from a bed to a chair (including a wheelchair)?: Total Help needed standing up from a chair using your arms (e.g., wheelchair or bedside chair)?: Total Help needed to walk in hospital room?: Total Help needed climbing 3-5 steps with a railing? : Total 6 Click Score: 7    End of Session Equipment Utilized During Treatment: Gait belt Activity Tolerance: Patient limited by fatigue Patient left: in chair;with call bell/phone within reach;with chair alarm set;with family/visitor present   PT Visit Diagnosis: Unsteadiness on feet (R26.81);Other abnormalities of gait and mobility (R26.89);Muscle weakness (generalized) (M62.81)    Time: 9758-8325 PT Time Calculation (min) (ACUTE ONLY): 46 min   Charges:   PT Evaluation $PT Eval Moderate Complexity: 1 Mod PT Treatments $Therapeutic Exercise: 8-22 mins        [facility] PT Acute Rehabilitation Services Pager [phone] Office [phone]   Angelina Ok Chalmers P[facility] 10/29/2020, 11:23 AM

## 2020-10-29 NOTE — Progress Notes (Signed)
[facility] Progress Note  8 Days Post-Op  Subjective: CC-  Just got up to chair with PT. He is worn out. Feeling nauseated, no emesis. More bloated than yesterday. Passing flatus and having bowel movements.  Objective: Vital signs in last 24 hours: Temp:  [98.3 F (36.8 C)-100.9 F (38.3 C)] 100.9 F (38.3 C) (09/16 0349) Pulse Rate:  [90-105] 95 (09/16 0600) Resp:  [20-34] 25 (09/16 0600) BP: (130-151)/(67-99) 139/81 (09/16 0600) SpO2:  [94 %-100 %] 99 % (09/16 0600) Weight:  [99.2 kg] 99.2 kg (09/16 0500) Last BM Date: 10/28/20  Intake/Output from previous day: 09/15 0701 - 09/16 0700 In: 1503.4 [I.V.:1192.8; IV Piggyback:310.6] Out: 3260 [Urine:2950; Drains:310] Intake/Output this shift: No intake/output data recorded.  PE: Gen:  Alert but drowsy, NAD Pulm:  Normal rate and effort  Abd: Distended but soft, generalized tenderness without rigidity or guarding. RUQ drains x2 with brown fluid in bulbs. Midline wound pale pink and clean without dehiscence.  4F - 55 14F - 255  Lab Results:  Recent Labs    10/27/20 0500 10/28/20 0212  WBC 16.5* 16.7*  HGB 8.2* 8.6*  HCT 25.5* 27.3*  PLT 158 140*   BMET Recent Labs    10/28/20 0212 10/29/20 0630  NA 143 143  K 3.5 3.7  CL 108 108  CO2 27 29  GLUCOSE 154* 177*  BUN 15 13  CREATININE 0.69 0.57*  CALCIUM 8.7* 8.7*   PT/INR No results for input(s): LABPROT, INR in the last 72 hours. CMP     Component Value Date/Time   NA 143 10/29/2020 0630   K 3.7 10/29/2020 0630   CL 108 10/29/2020 0630   CO2 29 10/29/2020 0630   GLUCOSE 177 (H) 10/29/2020 0630   BUN 13 10/29/2020 0630   CREATININE 0.57 (L) 10/29/2020 0630   CALCIUM 8.7 (L) 10/29/2020 0630   PROT 5.6 (L) 10/29/2020 0630   ALBUMIN 1.5 (L) 10/29/2020 0630   AST 28 10/29/2020 0630   ALT 28 10/29/2020 0630   ALKPHOS 147 (H) 10/29/2020 0630   BILITOT 0.6 10/29/2020 0630   GFRNONAA >60 10/29/2020 0630   Lipase     Component Value  Date/Time   LIPASE 25 11-08-2020 1506       Studies/Results: DG CHEST PORT 1 VIEW  Result Date: 10/28/2020 CLINICAL DATA:  Dyspnea. EXAM: PORTABLE CHEST 1 VIEW COMPARISON:  October 22, 2020. FINDINGS: Stable cardiomegaly. Interval placement of right-sided PICC line with distal tip in expected position of cavoatrial junction. Right lung is clear. Endotracheal and nasogastric tubes have been removed. Left midlung and basilar opacity is noted concerning for pneumonia or atelectasis with associated pleural effusion. Bony thorax is unremarkable. IMPRESSION: Increased left lung opacity is noted concerning for pneumonia or atelectasis with associated left pleural effusion. Electronically Signed   By: Lupita Raider M.D.   On: 10/28/2020 12:33   ECHOCARDIOGRAM COMPLETE  Result Date: 10/28/2020    ECHOCARDIOGRAM REPORT   Patient Name:   Paul Chambers Date of Exam: 10/28/2020 Medical Rec #:  [id number]    Height:       69.0 in Accession #:    [id number]   Weight:       225.3 lb Date of Birth:  11/01/1965   BSA:          2.173 m Patient Age:    54 years     BP:           132/84 mmHg Patient Gender: M  HR:           103 bpm. Exam Location:  Inpatient Procedure: 2D Echo Indications:    acute systolic chf  History:        Patient has no prior history of Echocardiogram examinations.                 Sepsis, Signs/Symptoms:Altered Mental Status; Risk                 Factors:Current Smoker.  Sonographer:    Delcie Roch RDCS Referring Phys: [id number] RAVI PAHWANI  Sonographer Comments: Image acquisition challenging due to uncooperative patient. IMPRESSIONS  1. Left ventricular ejection fraction, by estimation, is 65 to 70%. The left ventricle has normal function. The left ventricle has no regional wall motion abnormalities. The left ventricular internal cavity size was mildly dilated. Left ventricular diastolic parameters were normal.  2. Right ventricular systolic function is normal. The right ventricular  size is normal.  3. The mitral valve is normal in structure. Trivial mitral valve regurgitation.  4. The aortic valve is normal in structure. Aortic valve regurgitation is not visualized. FINDINGS  Left Ventricle: Left ventricular ejection fraction, by estimation, is 65 to 70%. The left ventricle has normal function. The left ventricle has no regional wall motion abnormalities. The left ventricular internal cavity size was mildly dilated. There is  no left ventricular hypertrophy. Left ventricular diastolic parameters were normal. Right Ventricle: The right ventricular size is normal. Right vetricular wall thickness was not assessed. Right ventricular systolic function is normal. Left Atrium: Left atrial size was normal in size. Right Atrium: Right atrial size was normal in size. Pericardium: There is no evidence of pericardial effusion. Mitral Valve: The mitral valve is normal in structure. Trivial mitral valve regurgitation. Tricuspid Valve: The tricuspid valve is normal in structure. Tricuspid valve regurgitation is trivial. Aortic Valve: The aortic valve is normal in structure. Aortic valve regurgitation is not visualized. Pulmonic Valve: The pulmonic valve was normal in structure. Pulmonic valve regurgitation is not visualized. Aorta: The aortic root and ascending aorta are structurally normal, with no evidence of dilitation. Venous: The inferior vena cava was not well visualized. IAS/Shunts: No atrial level shunt detected by color flow Doppler.  LEFT VENTRICLE PLAX 2D LVIDd:         5.40 cm  Diastology LVIDs:         3.50 cm  LV e' medial:    8.05 cm/s LV PW:         0.90 cm  LV E/e' medial:  10.2 LV IVS:        0.70 cm  LV e' lateral:   10.30 cm/s LVOT diam:     1.90 cm  LV E/e' lateral: 8.0 LV SV:         55 LV SV Index:   25 LVOT Area:     2.84 cm  RIGHT VENTRICLE RV S prime:     20.10 cm/s TAPSE (M-mode): 1.8 cm LEFT ATRIUM           Index       RIGHT ATRIUM           Index LA diam:      3.00 cm 1.38  cm/m  RA Area:     13.20 cm LA Vol (A2C): 34.7 ml 15.97 ml/m RA Volume:   29.40 ml  13.53 ml/m LA Vol (A4C): 33.1 ml 15.23 ml/m  AORTIC VALVE LVOT Vmax:   133.00 cm/s LVOT Vmean:  88.100 cm/s  LVOT VTI:    0.195 m  AORTA Ao Root diam: 3.30 cm Ao Asc diam:  3.20 cm MV E velocity: 82.00 cm/s MV A velocity: 110.00 cm/s  SHUNTS MV E/A ratio:  0.75         Systemic VTI:  0.20 m                             Systemic Diam: 1.90 cm Dietrich Pates MD Electronically signed by Dietrich Pates MD Signature Date/Time: 10/28/2020/5:29:43 PM    Final     Anti-infectives: Anti-infectives (From admission, onward)    Start     Dose/Rate Route Frequency Ordered Stop   10/28/20 1615  meropenem (MERREM) 2 g in sodium chloride 0.9 % 100 mL IVPB        2 g 200 mL/hr over 30 Minutes Intravenous Every 8 hours 10/28/20 1515     10/28/20 1445  metroNIDAZOLE (FLAGYL) IVPB 500 mg  Status:  Discontinued        500 mg 100 mL/hr over 60 Minutes Intravenous Every 12 hours 10/28/20 1348 10/28/20 1505   10/25/20 2300  fluconazole (DIFLUCAN) IVPB 400 mg        400 mg 100 mL/hr over 120 Minutes Intravenous Every 24 hours 10/25/20 0742 10/29/20 0718   10/24/20 2300  fluconazole (DIFLUCAN) IVPB 200 mg  Status:  Discontinued        200 mg 100 mL/hr over 60 Minutes Intravenous Every 24 hours 10/24/20 0943 10/25/20 0742   10/22/20 1800  vancomycin (VANCOREADY) IVPB 1500 mg/300 mL  Status:  Discontinued        1,500 mg 150 mL/hr over 120 Minutes Intravenous Every 24 hours 11/03/2020 2225 10/22/20 0759   10/22/20 0900  linezolid (ZYVOX) IVPB 600 mg  Status:  Discontinued        600 mg 300 mL/hr over 60 Minutes Intravenous Every 12 hours 10/22/20 0800 10/27/20 0852   10/22/20 0300  piperacillin-tazobactam (ZOSYN) IVPB 3.375 g  Status:  Discontinued        3.375 g 12.5 mL/hr over 240 Minutes Intravenous Every 8 hours 11/03/2020 2225 10/28/20 1514   10/28/2020 2335  metroNIDAZOLE (FLAGYL) IVPB 500 mg  Status:  Discontinued        500 mg 100 mL/hr  over 60 Minutes Intravenous Every 12 hours 11/02/2020 2142 10/22/20 0747   10/23/2020 2248  fluconazole (DIFLUCAN) IVPB 400 mg  Status:  Discontinued        400 mg 100 mL/hr over 120 Minutes Intravenous Every 24 hours 11/06/2020 2142 10/24/20 0943   11/10/2020 1748  vancomycin (VANCOREADY) IVPB 2000 mg/400 mL        2,000 mg 200 mL/hr over 120 Minutes Intravenous  Once 10/18/2020 1706 10/25/2020 2113   11/06/2020 1715  piperacillin-tazobactam (ZOSYN) IVPB 3.375 g        3.375 g 100 mL/hr over 30 Minutes Intravenous  Once 11/02/2020 1706 10/20/2020 1903        Assessment/Plan POD 7 s/p ex lap with pancreatic debridement for infected necrotic pancreatitis by Dr. Sheliah Hatch on 9/9 - Superior drain under the mesocolon along with the head of the pancreas - Inferior drain under the mesocolon along the left upper abdomen and what is likely a position inferior to the pancreas - Continue drains and monitor - NPO except ice chips 9/15 per SLP - WBC has been stable elevated, CBC pending this AM. New temp of 100.9. He was switched to [facility]  yesterday from zosyn by primary team for worsening CXR/ pneumonia  - having bowel function but seems a little more distended today and he is nauseated. Zofran PRN. Keep NPO. Will order CT scan abdomen/pelvis - Cont TPN for nutrition   FEN: TPN, ice chips ID: merrem VTE: heparin subq Foley - out, voiding    - Per primary -  VDRF - now extubated Abl anemia - s/p 1U PRBC 9/13 w/ appropriate response. ETOH use - weaned off precedex. Consider CIWA AKI - resolved, good uop T2DM   LOS: 8 days    Franne Forts, [facility] 10/29/2020, 10:25 AM Please see Amion for pager number during day hours 7:00am-4:30pm

## 2020-10-29 NOTE — Progress Notes (Signed)
PROGRESS NOTE    Amardeep Beckers  JEH:[id number] DOB: 06/16/1965 DOA: 10/15/2020 PCP: Pcp, No   Brief Narrative:  This 55 y.o. Caucasian male smoker presented to the [facility] Emergency Department via private vehicle with complaints of abdominal pain and altered mental status. The patient's wife reports that this is the third presentation to a healthcare facility in the past month for abdominal pain ([facility], [facility] and now [facility]).  The patient's deteriorating mental status over the past 2 days prompted the wife to force the patient to come to the hospital.  In the ER, CT head was negative for acute process, but CT abdomen showed an impressive pneumoperitoneum without an obvious location of perforation.  Surgery evaluated the patient and decided for exploratory laparotomy which was performed on 10/22/2020.  Sequence of events as below. Significant Hospital Events: Including procedures, antibiotic start and stop dates in addition to other pertinent events   9/8 - Presented to [facility] with abdominal pain. CT A/P with extensive pneumoperitoneum 9/9 - Ex-lap (no colonic perforation discovered, large necrotic fluid present surrounding pancreas. Transferred to ICU post-op, intubated and sedated.  9/10-received 1 unit PRBC for hemoglobin 6.7, went back on Levophed for short period of time 9/12 - Extubated to Venturi mask 9/13 - Off Fentanyl and Propofol gtt 10/28/20.  Patient transferred under TR[facility].  Assessment & Plan:   Principal Problem:   Severe sepsis (HCC) Active Problems:   Acute necrotizing pancreatitis   Toxic encephalopathy   Hyponatremia   Hypoalbuminemia   Macrocytic anemia   Pressure injury of skin   Protein-calorie malnutrition, severe   Infected Necrotizing Pancreatitis with Pneumoperitoneum s/p ex-lap by Dr. Drexel Iha on 10/22/2020: - General surgery following; appreciate their recommendations.  - Continue IV antibiotics with Zosyn and Fluconazole, 7  day planned - Discontinued Linezolid, as he has completed course.  - Continue TPN.  He pulled out his NG tube on 10/27/2020. Patient had fever last night.  General surgery obtaining CT scan. Further management per general surgery.   Acute respiratory failure with hypoxia/possible aspiration pneumonia/congestive heart failure/left pleural effusion: Intubated 10/22/2020 and extubated 10/26/2020.  Currently on 2 L oxygen.  Repeat chest x-ray shows worsening/increased left lung opacity concerning for pneumonia or atelectasis and associated left pleural effusion.  Repeat procalcitonin improved significantly.  He received 1 dose of Lasix yesterday and this has caused significant improvement, he still has crackles and rhonchi today but much improved compared to yesterday.  We will continue IV Lasix 40 mg daily.  Continue to wean oxygen.  Continue Merrem.     Acute toxic-metabolic encephalopathy: Sepsis +/- ICU delirium: Patient once again is alert but slightly confused and delirious.  Maintain delirium precautions.  Ileus: Resolving.  Managed by general surgery.   Acute hepatitis: Stable. 2/2 to TPN - CMP daily    Alcohol abuse: Out of the window for withdrawal complications.   - Continue thiamine and folate   Acute Blood Loss Anemia, postsurgical: S/p 1 unit of pRBCs on 9/10 with stabilization in hemoglobin since.  - Monitor H&H and transfuse if less than 7   Severe protein calorie malnutrition - Continue TPN   Diabetes type 2 with hyperglycemia: Hemoglobin A1c was 6.8% on admission.  Blood sugar controlled.  He is not on any Lantus.  Continue SSI.   Hypokalemia: Resolved.  Hypophosphatemia: Replenished.   DVT prophylaxis: heparin injection 5,000 Units Start: 10/22/20 1400 Place and maintain sequential compression device Start: 10/16/2020 2202 SCDs Start: 11/04/2020 2145   Code  Status: Full Code  Family Communication: Patient's wife present at bedside.  Plan of care discussed with patient and  his wife  Status is: Inpatient  Remains inpatient appropriate because:Inpatient level of care appropriate due to severity of illness  Dispo: The patient is from: Home              Anticipated d/c is to: SNF              Patient currently is not medically stable to d/c.   Difficult to place patient No        Estimated body mass index is 32.3 kg/m as calculated from the following:   Height as of this encounter:  (1.753 m).   Weight as of this encounter: 99.2 kg.     Nutritional Assessment: Body mass index is 32.3 kg/m.Marland Kitchen Seen by dietician.  I agree with the assessment and plan as outlined below: Nutrition Status: Nutrition Problem: Severe Malnutrition Etiology: acute illness (infected necrotic pancreatitis) Signs/Symptoms: moderate fat depletion, moderate muscle depletion, energy intake < or equal to 50% for > or equal to 5 days, percent weight loss (8.4% weight loss in 1.5 months) Percent weight loss: 8.4 % (1.5 months) Interventions: Refer to RD note for recommendations  .  Skin Assessment: I have examined the patient's skin and I agree with the wound assessment as performed by the wound care RN as outlined below:    Consultants:  Neurosurgery  Procedures:  As above  Antimicrobials:  Anti-infectives (From admission, onward)    Start     Dose/Rate Route Frequency Ordered Stop   10/29/20 2200  meropenem (MERREM) 1 g in sodium chloride 0.9 % 100 mL IVPB        1 g 200 mL/hr over 30 Minutes Intravenous Every 8 hours 10/29/20 1428     10/28/20 1615  meropenem (MERREM) 2 g in sodium chloride 0.9 % 100 mL IVPB  Status:  Discontinued        2 g 200 mL/hr over 30 Minutes Intravenous Every 8 hours 10/28/20 1515 10/29/20 1428   10/28/20 1445  metroNIDAZOLE (FLAGYL) IVPB 500 mg  Status:  Discontinued        500 mg 100 mL/hr over 60 Minutes Intravenous Every 12 hours 10/28/20 1348 10/28/20 1505   10/25/20 2300  fluconazole (DIFLUCAN) IVPB 400 mg        400 mg 100  mL/hr over 120 Minutes Intravenous Every 24 hours 10/25/20 0742 10/29/20 0718   10/24/20 2300  fluconazole (DIFLUCAN) IVPB 200 mg  Status:  Discontinued        200 mg 100 mL/hr over 60 Minutes Intravenous Every 24 hours 10/24/20 0943 10/25/20 0742   10/22/20 1800  vancomycin (VANCOREADY) IVPB 1500 mg/300 mL  Status:  Discontinued        1,500 mg 150 mL/hr over 120 Minutes Intravenous Every 24 hours 10/30/2020 2225 10/22/20 0759   10/22/20 0900  linezolid (ZYVOX) IVPB 600 mg  Status:  Discontinued        600 mg 300 mL/hr over 60 Minutes Intravenous Every 12 hours 10/22/20 0800 10/27/20 0852   10/22/20 0300  piperacillin-tazobactam (ZOSYN) IVPB 3.375 g  Status:  Discontinued        3.375 g 12.5 mL/hr over 240 Minutes Intravenous Every 8 hours October 30, 2020 2225 10/28/20 1514   2020-10-30 2335  metroNIDAZOLE (FLAGYL) IVPB 500 mg  Status:  Discontinued        500 mg 100 mL/hr over 60 Minutes Intravenous Every 12  hours Oct 27, 2020 2142 10/22/20 0747   10-27-2020 2248  fluconazole (DIFLUCAN) IVPB 400 mg  Status:  Discontinued        400 mg 100 mL/hr over 120 Minutes Intravenous Every 24 hours 10-27-2020 2142 10/24/20 0943   Oct 27, 2020 1748  vancomycin (VANCOREADY) IVPB 2000 mg/400 mL        2,000 mg 200 mL/hr over 120 Minutes Intravenous  Once 10/27/2020 1706 10-27-2020 2113   10/27/20 1715  piperacillin-tazobactam (ZOSYN) IVPB 3.375 g        3.375 g 100 mL/hr over 30 Minutes Intravenous  Once 2020/10/27 1706 10/27/20 1903          Subjective: Patient seen and examined.  Wife at the bedside.  Patient alert but slightly confused.  Requiring prompts for him to answer appropriate questions.  Looks comfortable.  Objective: Vitals:   10/29/20 0349 10/29/20 0400 10/29/20 0500 10/29/20 0600  BP:  (!) 142/86 (!) 150/86 139/81  Pulse:  95 90 95  Resp:  (!) 29 (!) 27 (!) 25  Temp: (!) 100.9 F (38.3 C)     TempSrc: Axillary     SpO2:  97% 98% 99%  Weight:   99.2 kg   Height:        Intake/Output Summary  (Last 24 hours) at 10/29/2020 1511 Last data filed at 10/29/2020 0538 Gross per 24 hour  Intake 500.34 ml  Output 2755 ml  Net -2254.66 ml    Filed Weights   10/24/20 0403 10/25/20 0402 10/29/20 0500  Weight: 100.6 kg 102.2 kg 99.2 kg    Examination:  General exam: Appears calm and comfortable  Respiratory system: Bilateral rhonchi and crackles but improved compared to yesterday. Respiratory effort normal. Cardiovascular system: S1 & S2 heard, RRR. No JVD, murmurs, rubs, gallops or clicks. No pedal edema. Gastrointestinal system: Abdomen is nondistended, soft and nontender. No organomegaly or masses felt. Normal bowel sounds heard. Central nervous system: Alert but oriented x2.  No focal neurological deficits. Extremities: Symmetric 5 x 5 power. Skin: No rashes, lesions or ulcers.    Data Reviewed: I have personally reviewed following labs and imaging studies  CBC: Recent Labs  Lab 10/26/20 0808 10/26/20 1056 10/26/20 1811 10/27/20 0500 10/28/20 0212 10/29/20 1008  WBC 16.7* 15.4* 17.8* 16.5* 16.7* 12.4*  NEUTROABS 15.0*  --   --  15.7* 15.2* 10.3*  HGB 6.5* 6.5* 9.2* 8.2* 8.6* 8.6*  HCT 20.4* 22.3* 28.3* 25.5* 27.3* 26.8*  MCV 113.3* 129.7* 104.0* 104.1* 106.2* 105.9*  PLT 150 139* 169 158 140* 114*    Basic Metabolic Panel: Recent Labs  Lab 10/24/20 0349 10/24/20 1154 10/25/20 0328 10/26/20 0401 10/26/20 1811 10/27/20 0500 10/28/20 0212 10/29/20 0630  NA 128*   < > 132* 135 136 138 143 143  K 4.7   < > 4.5 3.5 3.5 3.3* 3.5 3.7  CL 92*  --  97* 101 101 104 108 108  CO2 24  --  25 25 24 25 27 29   GLUCOSE 285*  --  199* 123* 223* 252* 154* 177*  BUN 51*  --  51* 37* 27* 22* 15 13  CREATININE 2.18*  --  1.76* 1.12 0.90 0.87 0.69 0.57*  CALCIUM 7.8*  --  8.0* 8.1* 8.3* 8.3* 8.7* 8.7*  MG 2.6*  --  2.4 2.2  --  2.1 2.0  --   PHOS 6.3*  --  4.0 2.3*  --  2.6 2.1*  --    < > = values in this interval  not displayed.    GFR: Estimated Creatinine Clearance:  125.4 mL/min (A) (by C-G formula based on SCr of 0.57 mg/dL (L)). Liver Function Tests: Recent Labs  Lab 10/25/20 0328 10/26/20 0401 10/27/20 0500 10/28/20 0212 10/29/20 0630  AST 52* 84* 44* 35 28  ALT 33 46* 35 30 28  ALKPHOS 113 190* 130* 141* 147*  BILITOT 1.0 1.0 1.0 0.9 0.6  PROT 5.3* 5.3* 5.3* 5.3* 5.6*  ALBUMIN 1.7* 1.5* 1.5* 1.5* 1.5*    No results for input(s): LIPASE, AMYLASE in the last 168 hours.  No results for input(s): AMMONIA in the last 168 hours.  Coagulation Profile: No results for input(s): INR, PROTIME in the last 168 hours.  Cardiac Enzymes: No results for input(s): CKTOTAL, CKMB, CKMBINDEX, TROPONINI in the last 168 hours.  BNP (last 3 results) No results for input(s): PROBNP in the last 8760 hours. HbA1C: No results for input(s): HGBA1C in the last 72 hours. CBG: Recent Labs  Lab 10/28/20 1955 10/28/20 2331 10/29/20 0324 10/29/20 0740 10/29/20 1153  GLUCAP 221* 208* 162* 173* 172*    Lipid Profile: No results for input(s): CHOL, HDL, LDLCALC, TRIG, CHOLHDL, LDLDIRECT in the last 72 hours. Thyroid Function Tests: No results for input(s): TSH, T4TOTAL, FREET4, T3FREE, THYROIDAB in the last 72 hours. Anemia Panel: No results for input(s): VITAMINB12, FOLATE, FERRITIN, TIBC, IRON, RETICCTPCT in the last 72 hours. Sepsis Labs: Recent Labs  Lab 10/23/20 0910 10/28/20 1347  PROCALCITON  --  2.38  LATICACIDVEN 1.8  --      Recent Results (from the past 240 hour(s))  Resp Panel by RT-PCR (Flu A&B, Covid) Nasopharyngeal Swab     Status: None   Collection Time: 04-Nov-2020  8:49 PM   Specimen: Nasopharyngeal Swab; Nasopharyngeal(NP) swabs in vial transport medium  Result Value Ref Range Status   SARS Coronavirus 2 by RT PCR NEGATIVE NEGATIVE Final    Comment: (NOTE) SARS-CoV-2 target nucleic acids are NOT DETECTED.  The SARS-CoV-2 RNA is generally detectable in upper respiratory specimens during the acute phase of infection. The  lowest concentration of SARS-CoV-2 viral copies this assay can detect is 138 copies/mL. A negative result does not preclude SARS-Cov-2 infection and should not be used as the sole basis for treatment or other patient management decisions. A negative result may occur with  improper specimen collection/handling, submission of specimen other than nasopharyngeal swab, presence of viral mutation(s) within the areas targeted by this assay, and inadequate number of viral copies(<138 copies/mL). A negative result must be combined with clinical observations, patient history, and epidemiological information. The expected result is Negative.  Fact Sheet for Patients:  BloggerCourse.com  Fact Sheet for Healthcare Providers:  SeriousBroker.it  This test is no t yet approved or cleared by the [place] FDA and  has been authorized for detection and/or diagnosis of SARS-CoV-2 by FDA under an Emergency Use Authorization (EUA). This EUA will remain  in effect (meaning this test can be used) for the duration of the COVID-19 declaration under Section 564(b)(1) of the Act, 21 U.S.C.section 360bbb-3(b)(1), unless the authorization is terminated  or revoked sooner.       Influenza A by PCR NEGATIVE NEGATIVE Final   Influenza B by PCR NEGATIVE NEGATIVE Final    Comment: (NOTE) The Xpert Xpress SARS-CoV-2/FLU/RSV plus assay is intended as an aid in the diagnosis of influenza from Nasopharyngeal swab specimens and should not be used as a sole basis for treatment. Nasal washings and aspirates are unacceptable for Xpert Xpress  SARS-CoV-2/FLU/RSV testing.  Fact Sheet for Patients: BloggerCourse.com  Fact Sheet for Healthcare Providers: SeriousBroker.it  This test is not yet approved or cleared by the [place] FDA and has been authorized for detection and/or diagnosis of SARS-CoV-2 by FDA under  an Emergency Use Authorization (EUA). This EUA will remain in effect (meaning this test can be used) for the duration of the COVID-19 declaration under Section 564(b)(1) of the Act, 21 U.S.C. section 360bbb-3(b)(1), unless the authorization is terminated or revoked.  Performed at [facility], [address] [place], [place] [address]   Culture, blood (routine x 2)     Status: None   Collection Time: 11/12/2020 10:17 PM   Specimen: BLOOD  Result Value Ref Range Status   Specimen Description BLOOD LEFT ANTECUBITAL  Final   Special Requests   Final    BOTTLES DRAWN AEROBIC AND ANAEROBIC Blood Culture adequate volume   Culture   Final    NO GROWTH 5 DAYS Performed at [facility], [address], [place], [place] [address]    Report Status 10/26/2020 FINAL  Final  MRSA Next Gen by PCR, Nasal     Status: None   Collection Time: 10/22/20  2:00 AM  Result Value Ref Range Status   MRSA by PCR Next Gen NOT DETECTED NOT DETECTED Final    Comment: (NOTE) The GeneXpert MRSA Assay (FDA approved for NASAL specimens only), is one component of a comprehensive MRSA colonization surveillance program. It is not intended to diagnose MRSA infection nor to guide or monitor treatment for MRSA infections. Test performance is not FDA approved in patients less than 24 years old. Performed at [facility], [address], [place], [place] [address]   Culture, blood (routine x 2)     Status: None   Collection Time: 10/22/20  3:52 AM   Specimen: BLOOD  Result Value Ref Range Status   Specimen Description BLOOD LEFT ANTECUBITAL  Final   Special Requests   Final    BOTTLES DRAWN AEROBIC AND ANAEROBIC Blood Culture adequate volume   Culture   Final    NO GROWTH 5 DAYS Performed at [facility], [address], [place], [place] [address]    Report Status 10/27/2020 FINAL  Final       Radiology Studies: DG CHEST PORT 1 VIEW  Result Date: 10/28/2020 CLINICAL DATA:  Dyspnea.  EXAM: PORTABLE CHEST 1 VIEW COMPARISON:  October 22, 2020. FINDINGS: Stable cardiomegaly. Interval placement of right-sided PICC line with distal tip in expected position of cavoatrial junction. Right lung is clear. Endotracheal and nasogastric tubes have been removed. Left midlung and basilar opacity is noted concerning for pneumonia or atelectasis with associated pleural effusion. Bony thorax is unremarkable. IMPRESSION: Increased left lung opacity is noted concerning for pneumonia or atelectasis with associated left pleural effusion. Electronically Signed   By: Lupita Raider M.D.   On: 10/28/2020 12:33   DG Abd Portable 1V  Result Date: 10/29/2020 CLINICAL DATA:  Feeding tube placement. EXAM: PORTABLE ABDOMEN - 1 VIEW COMPARISON:  One-view abdomen 10/26/2020 FINDINGS: Tip of a small bore feeding tube is in the distal stomach. Are gas pattern is unremarkable. Heart is enlarged. Left lower lobe airspace disease is again seen. IMPRESSION: Tip of small bore feeding tube in the distal stomach. Electronically Signed   By: Marin Roberts M.D.   On: 10/29/2020 14:21   ECHOCARDIOGRAM COMPLETE  Result Date: 10/28/2020    ECHOCARDIOGRAM REPORT  Patient Name:   LATWAN LUCHSINGER Date of Exam: 10/28/2020 Medical Rec #:  [id number]    Height:       69.0 in Accession #:    [id number]   Weight:       225.3 lb Date of Birth:  1966/02/10   BSA:          2.173 m Patient Age:    54 years     BP:           132/84 mmHg Patient Gender: M            HR:           103 bpm. Exam Location:  Inpatient Procedure: 2D Echo Indications:    acute systolic chf  History:        Patient has no prior history of Echocardiogram examinations.                 Sepsis, Signs/Symptoms:Altered Mental Status; Risk                 Factors:Current Smoker.  Sonographer:    Delcie Roch RDCS Referring Phys: [id number] Minnie Legros  Sonographer Comments: Image acquisition challenging due to uncooperative patient. IMPRESSIONS  1. Left ventricular  ejection fraction, by estimation, is 65 to 70%. The left ventricle has normal function. The left ventricle has no regional wall motion abnormalities. The left ventricular internal cavity size was mildly dilated. Left ventricular diastolic parameters were normal.  2. Right ventricular systolic function is normal. The right ventricular size is normal.  3. The mitral valve is normal in structure. Trivial mitral valve regurgitation.  4. The aortic valve is normal in structure. Aortic valve regurgitation is not visualized. FINDINGS  Left Ventricle: Left ventricular ejection fraction, by estimation, is 65 to 70%. The left ventricle has normal function. The left ventricle has no regional wall motion abnormalities. The left ventricular internal cavity size was mildly dilated. There is  no left ventricular hypertrophy. Left ventricular diastolic parameters were normal. Right Ventricle: The right ventricular size is normal. Right vetricular wall thickness was not assessed. Right ventricular systolic function is normal. Left Atrium: Left atrial size was normal in size. Right Atrium: Right atrial size was normal in size. Pericardium: There is no evidence of pericardial effusion. Mitral Valve: The mitral valve is normal in structure. Trivial mitral valve regurgitation. Tricuspid Valve: The tricuspid valve is normal in structure. Tricuspid valve regurgitation is trivial. Aortic Valve: The aortic valve is normal in structure. Aortic valve regurgitation is not visualized. Pulmonic Valve: The pulmonic valve was normal in structure. Pulmonic valve regurgitation is not visualized. Aorta: The aortic root and ascending aorta are structurally normal, with no evidence of dilitation. Venous: The inferior vena cava was not well visualized. IAS/Shunts: No atrial level shunt detected by color flow Doppler.  LEFT VENTRICLE PLAX 2D LVIDd:         5.40 cm  Diastology LVIDs:         3.50 cm  LV e' medial:    8.05 cm/s LV PW:         0.90 cm  LV  E/e' medial:  10.2 LV IVS:        0.70 cm  LV e' lateral:   10.30 cm/s LVOT diam:     1.90 cm  LV E/e' lateral: 8.0 LV SV:         55 LV SV Index:   25 LVOT Area:     2.84 cm  RIGHT VENTRICLE RV  S prime:     20.10 cm/s TAPSE (M-mode): 1.8 cm LEFT ATRIUM           Index       RIGHT ATRIUM           Index LA diam:      3.00 cm 1.38 cm/m  RA Area:     13.20 cm LA Vol (A2C): 34.7 ml 15.97 ml/m RA Volume:   29.40 ml  13.53 ml/m LA Vol (A4C): 33.1 ml 15.23 ml/m  AORTIC VALVE LVOT Vmax:   133.00 cm/s LVOT Vmean:  88.100 cm/s LVOT VTI:    0.195 m  AORTA Ao Root diam: 3.30 cm Ao Asc diam:  3.20 cm MV E velocity: 82.00 cm/s MV A velocity: 110.00 cm/s  SHUNTS MV E/A ratio:  0.75         Systemic VTI:  0.20 m                             Systemic Diam: 1.90 cm Dietrich Pates MD Electronically signed by Dietrich Pates MD Signature Date/Time: 10/28/2020/5:29:43 PM    Final     Scheduled Meds:  chlorhexidine  15 mL Mouth Rinse BID   Chlorhexidine Gluconate Cloth  6 each Topical Q0600   fluticasone furoate-vilanterol  1 puff Inhalation Daily   And   umeclidinium bromide  1 puff Inhalation Daily   furosemide  40 mg Intravenous Daily   heparin  5,000 Units Subcutaneous Q8H   insulin aspart  0-20 Units Subcutaneous Q4H   iohexol       mouth rinse  15 mL Mouth Rinse q12n4p   pantoprazole (PROTONIX) IV  40 mg Intravenous Q24H   sodium chloride flush  10-40 mL Intracatheter Q12H   sodium chloride flush  10-40 mL Intracatheter Q12H   Continuous Infusions:  sodium chloride Stopped (10/27/20 1129)   meropenem (MERREM) IV     TPN ADULT (ION) 100 mL/hr at 10/28/20 1900   TPN ADULT (ION)       LOS: 8 days   Time spent: 30 minutes   Hughie Closs, MD [facility]  10/29/2020, 3:11 PM  Please page via Amion and do not message via secure chat for anything urgent. Secure chat can be used for anything non urgent and I will respond at my earliest availability.  How to contact the [facility] Attending or Consulting  provider 7A - 7P or covering provider during after hours 7P -7A, for this patient?  Check the care team in [facility] and look for a) attending/consulting [facility] provider listed and b) the [facility] team listed. Page or secure chat 7A-7P. Log into [email or web address] and use [place]'s universal password to access. If you do not have the password, please contact the hospital operator. Locate the [facility] provider you are looking for under [facility] and page to a number that you can be directly reached. If you still have difficulty reaching the provider, please page the [facility] (Director on Call) for the Hospitalists listed on amion for assistance.

## 2020-10-29 NOTE — Progress Notes (Signed)
PHARMACY - TOTAL PARENTERAL NUTRITION CONSULT NOTE   Indication:  severe pancreatitis  Patient Measurements: Height: 5\' 9"  (175.3 cm) Weight: 99.2 kg (218 lb 11.1 oz) IBW/kg (Calculated) : 70.7 TPN AdjBW (KG): 78 Body mass index is 32.3 kg/m.  Assessment: 32 yom presenting 9/8 with septic shock due to necrotizing pancreatitis with pneumoperitoneum s/p ex-lap 9/9. Pt transferred to ICU post-op, intubated and sedated. Pharmacy consulted to start TPN for severe pancreatitis. Pt at risk of refeeding with minimal PO intake x 1 week PTA, decreased PO intake x 1.5 months PTA with significant weight loss and hx heavy alcohol abuse.  Glucose / Insulin: A1c 6.8 (no meds pta). CBGs improving with increase of insulin in TPN 160-170s and utilized 22 units rSSI since new TPN hung at 1800 PM with 60 units in TPN. *Received Dextrose in Phos replacement 9/15 AM  Electrolytes: K 3.7, others WNL Renal: AKI resolved - SCr 0.57, BUN down 13 Hepatic: LFTs / Tbili normalized, TG WNL, albumin 1.5; Triglycerides 97 Intake / Output; MIVF: NGT clamped 9/14, drain output down 225ml/24hrs, UOP 1.1 ml/kg/hr. LBM 9/15 x5  GI Imaging:  9/8 CT A/P - extensive pneumoperitoneum 9/16 Repeat CT A/P - pending GI Surgeries / Procedures: 9/9 ex-lap (no colonic perforation, large necrotic fluid present surrounding pancreas)  Central access: CVC 9/9 TPN start date: 9/10  Nutritional Goals: Goal TPN rate is 100 mL/hr (provides 132 g of protein and 2390 kcals per day) meeting 100% of needs  RD Assessment: Estimated Needs Total Energy Estimated Needs: 2350-2550 Total Protein Estimated Needs: 125-145 grams Total Fluid Estimated Needs: >/= 2.0 L  Current Nutrition:  NPO and TPN  Plan:  Continue TPN to 100 mL/hr at 1800  Electrolytes in TPN: Na to 51mEq/L, K to 35 meq/L; Phos 10 meq/L, Ca 57mEq/L, Mg 5 mEq/L. Cl:Ac to 1:1  Add standard MVI, trace elements, folic acid 1mg , and thiamine 100mg  to TPN Continue Resistant q4h  SSI Continue Insulin to 60 units in TPN Monitor TPN labs F/u Surgery plans - Plan for CT scan today, follow-up ability to start enteral feeding Recheck Phos in AM   4m, PharmD, BCPS, BCCCP Clinical Pharmacist Please refer to [facility] for [facility] numbers 10/29/2020, 7:36 AM

## 2020-10-29 NOTE — Procedures (Signed)
Cortrak  Person Inserting Tube:  Vann Okerlund, RD Tube Type:  Cortrak - 43 inches Tube Size:  10 Tube Location:  Right nare Initial Placement:  Stomach Secured by: Bridle Technique Used to Measure Tube Placement:  Marking at nare/corner of mouth Cortrak Secured At:  68 cm Procedure Comments:  Cortrak Tube Team Note:  Consult received to place a Cortrak feeding tube.   X-ray is required, abdominal x-ray has been ordered by the Cortrak team. Please confirm tube placement before using the Cortrak tube.   If the tube becomes dislodged please keep the tube and contact the Cortrak team at [email or web address] ([email or web address]) for replacement.  If after hours and replacement cannot be delayed, place a NG tube and confirm placement with an abdominal x-ray.    Ravenne Wayment MS, RD, LDN, CNSC Clinical Nutrition Pager listed in AMION    

## 2020-10-29 NOTE — Evaluation (Signed)
Occupational Therapy Evaluation Patient Details Name: Paul Chambers MRN: [id number] DOB: 26-Mar-1965 Today's Date: 10/29/2020   History of Present Illness Pt adm 9/8 with abdominal pain and AMS. Pt found to have extensive pneumoperitoneum and underwent ex lap on 9/9. Pt with infected necrotizing pancreatitis. Pt with acute hypoxic respiratory failure. Pt intubated 9/9-9/12. PMH - etoh, HTN, gout, pancreatitis.   Clinical Impression   Patient admitted for the diagnosis above.  Seen in conjunction with PT due to patient's medical condition and decreased level of alertness.  PTA he lives at home with his spouse, who is able to assist as needed.  He continues to work full time, used a RW recently due to abdominal pain, but generally is independent with all mobility and self care.  Deficits impacting function are listed below.  Currently, he is needing up to Mod a of two for basic mobility, and up to Max A for lower body ADL at bed level.  Patient is very motivated, and CIR is recommended for aggressive post acute rehab to regain his prior level of function.  OT will continue to follow in the acute setting to maximize his functional status.       Recommendations for follow up therapy are one component of a multi-disciplinary discharge planning process, led by the attending physician.  Recommendations may be updated based on patient status, additional functional criteria and insurance authorization.   Follow Up Recommendations  CIR    Equipment Recommendations  3 in 1 bedside commode;None recommended by OT;Tub/shower seat    Recommendations for Other Services Rehab consult     Precautions / Restrictions Precautions Precautions: Fall;Other (comment) Precaution Comments: abdominal drains Restrictions Weight Bearing Restrictions: No      Mobility Bed Mobility Overal bed mobility: Needs Assistance Bed Mobility: Supine to Sit     Supine to sit: +2 for physical assistance;Max assist;HOB  elevated     General bed mobility comments: Assist to bring legs off of bed, elevate trunk into sitting and bring hips to EOB. Patient Response: Cooperative  Transfers Overall transfer level: Needs assistance Equipment used: Rolling walker (2 wheeled) Transfers: Sit to/from [employer] Sit to Stand: +2 physical assistance;Mod assist Stand pivot transfers: +2 physical assistance;Mod assist       General transfer comment: Assist to bring hips up and for balance. Bed to chair with short shuffling steps with rolling walker    Balance Overall balance assessment: Needs assistance Sitting-balance support: Bilateral upper extremity supported Sitting balance-Leahy Scale: Poor Sitting balance - Comments: UE support and min  to min guard sitting EOB.   Standing balance support: Bilateral upper extremity supported Standing balance-Leahy Scale: Poor Standing balance comment: walker and +2 min assist for static stand x 60 sec                           ADL either performed or assessed with clinical judgement   ADL Overall ADL's : Needs assistance/impaired     Grooming: Wash/dry hands;Wash/dry face;Minimal assistance;Sitting   Upper Body Bathing: Moderate assistance;Sitting   Lower Body Bathing: Maximal assistance;Bed level   Upper Body Dressing : Moderate assistance;Sitting   Lower Body Dressing: Maximal assistance;Bed level   Toilet Transfer: +2 for safety/equipment;+2 for physical assistance;Moderate assistance   Toileting- Clothing Manipulation and Hygiene: Maximal assistance;Bed level       Functional mobility during ADLs: +2 for physical assistance;Moderate assistance       Vision Patient Visual Report: No change from  baseline       Perception  WFL   Praxis  Intact    Pertinent Vitals/Pain Pain Assessment: Faces Pain Score: 10-Worst pain ever Faces Pain Scale: Hurts even more Pain Location: lower back Pain Descriptors / Indicators:  Tender Pain Intervention(s): Monitored during session     Hand Dominance Right   Extremity/Trunk Assessment Upper Extremity Assessment Upper Extremity Assessment: Generalized weakness   Lower Extremity Assessment Lower Extremity Assessment: Defer to PT evaluation   Cervical / Trunk Assessment Cervical / Trunk Assessment: Normal   Communication Communication Communication: Expressive difficulties   Cognition Arousal/Alertness: Lethargic Behavior During Therapy: Flat affect;Impulsive Overall Cognitive Status: Impaired/Different from baseline Area of Impairment: Orientation;Attention;Following commands;Problem solving;Safety/judgement;Awareness                 Orientation Level: Place;Time;Situation Current Attention Level: Sustained   Following Commands: Follows one step commands with increased time Safety/Judgement: Decreased awareness of safety;Decreased awareness of deficits Awareness: Intellectual Problem Solving: Slow processing;Difficulty sequencing;Requires verbal cues;Requires tactile cues General Comments: At times pt awake and conversing but quickly falls asleep if not stimulated. Pt impulsive in wanting to stand to go to the bathroom despite multiple cues that condom cath in place.   General Comments       Exercises     Shoulder Instructions      Home Living Family/patient expects to be discharged to:: Private residence Living Arrangements: Spouse/significant other Available Help at Discharge: Family;Available 24 hours/day Type of Home: Mobile home Home Access: Stairs to enter Entrance Stairs-Number of Steps: 4 Entrance Stairs-Rails: Right Home Layout: One level     Bathroom Shower/Tub: Producer, television/film/video: Standard     Home Equipment: Walker - 4 wheels          Prior Functioning/Environment Level of Independence: Independent        Comments: Using rollator just prior to admission. Works an Geophysical data processor  Problem List: Decreased strength;Decreased activity tolerance;Impaired balance (sitting and/or standing);Decreased knowledge of precautions;Decreased knowledge of use of DME or AE;Decreased safety awareness;Pain;Decreased cognition      OT Treatment/Interventions: Self-care/ADL training;Therapeutic exercise;Therapeutic activities;Cognitive remediation/compensation;DME and/or AE instruction;Balance training    OT Goals(Current goals can be found in the care plan section) Acute Rehab OT Goals Patient Stated Goal: get to the bathroom OT Goal Formulation: With patient Time For Goal Achievement: 11/12/20 Potential to Achieve Goals: Good ADL Goals Pt Will Perform Grooming: with set-up;sitting Pt Will Perform Upper Body Bathing: sitting;with supervision Pt Will Perform Lower Body Bathing: with min assist;sit to/from stand Pt Will Perform Upper Body Dressing: with supervision;sitting Pt Will Perform Lower Body Dressing: with min assist;sit to/from stand Pt Will Transfer to Toilet: with min assist;ambulating;regular height toilet Pt/caregiver will Perform Home Exercise Program: Increased strength;Both right and left upper extremity;With theraband;With Supervision;With written HEP provided  OT Frequency: Min 2X/week   Barriers to D/C:    none noted       Co-evaluation PT/OT/SLP Co-Evaluation/Treatment: Yes Reason for Co-Treatment: For patient/therapist safety;To address functional/ADL transfers;Necessary to address cognition/behavior during functional activity;Complexity of the patient's impairments (multi-system involvement) PT goals addressed during session: Mobility/safety with mobility;Balance OT goals addressed during session: ADL's and self-care      AM-PAC OT "6 Clicks" Daily Activity     Outcome Measure Help from another person eating meals?: A Little Help from another person taking care of personal grooming?: A Little Help from another person toileting, which includes using toliet,  bedpan, or urinal?:  A Lot Help from another person bathing (including washing, rinsing, drying)?: A Lot Help from another person to put on and taking off regular upper body clothing?: A Lot Help from another person to put on and taking off regular lower body clothing?: A Lot 6 Click Score: 14   End of Session Equipment Utilized During Treatment: Gait belt;Rolling walker Nurse Communication: Mobility status  Activity Tolerance: Patient tolerated treatment well Patient left: in chair;with call bell/phone within reach;with family/visitor present;with restraints reapplied  OT Visit Diagnosis: Unsteadiness on feet (R26.81);Muscle weakness (generalized) (M62.81);Other symptoms and signs involving cognitive function;Pain                Time: 1000-1030 OT Time Calculation (min): 30 min Charges:  OT General Charges $OT Visit: 1 Visit OT Evaluation $OT Eval Moderate Complexity: 1 Mod  10/29/2020  RP, OTR/L  Acute Rehabilitation Services  Office:  [phone]   Suzanna Obey 10/29/2020, 1:36 PM

## 2020-10-29 NOTE — Progress Notes (Signed)
Nutrition Follow-up  DOCUMENTATION CODES:   Severe malnutrition in context of acute illness/injury  INTERVENTION:   - Continue TPN management per Pharmacy to meet 100% of pt's estimated needs  When cleared by Surgery, recommend advancing Cortrak tube post-pyloric and trial of trickle tube feeds. Recommend: - Vital 1.5 @ 20 ml/hr (480 ml/day)   If pt demonstrates tolerance of trickle tube feeds, recommend advancing to goal: - Advance Vital 1.5 by 10 ml q 8 hours to goal rate of 65 ml/hr (1560 ml/day) - ProSource TF 45 ml TID  Recommended tube feeding regimen at goal would provide 2460 kcal, 138 grams of protein, and 1192 ml of H2O.   NUTRITION DIAGNOSIS:   Severe Malnutrition related to acute illness (infected necrotic pancreatitis) as evidenced by moderate fat depletion, moderate muscle depletion, energy intake < or equal to 50% for > or equal to 5 days, percent weight loss (8.4% weight loss in 1.5 months).  Ongoing, being addressed via TPN  GOAL:   Patient will meet greater than or equal to 90% of their needs  Met via TPN  MONITOR:   Vent status, Labs, Weight trends, Skin, I & O's  REASON FOR ASSESSMENT:   Ventilator    ASSESSMENT:   55 year old male who presented to the ED on 9/08 with AMS and abdominal pain. Pt seen at [place] 1 week PTA and was diagnosed with AKI and pancreatitis. PMH of HTN, gout, tobacco abuse, EtOH abuse. Pt admitted with sepsis secondary to infected necrotic pancreatitis.  9/09 - s/p ex-lap with pancreatic debridement for infected necrotic pancreatitis 9/10 - TPN initiated 9/12 - TPN increased to goal rate, extubated 9/14 - NGT clamping trials, NGT removed by pt 9/15 - SLP evaluation with recommendations for NPO except ice chips 9/16 - Cortrak placed for administration of contrast for CT (tip gastric)  Discussed pt with RN and during ICU rounds. Drains with thick brown output per Surgery note. Pt with new fever today. Pt passing flatus and  having bowel movements (5 unmeasured occurrences documented yesterday). Plan is to obtain CT scan abdomen/pelvis.  TPN infusing at goal rate of 100 ml/hr which provides 2390 kcal and 132 grams of protein daily.  Cortrak was placed today (tip gastric) for administration of contrast for CT. RD will leave tube feeding recommendations for use once pt cleared for trial of enteral nutrition per Surgery.  Admit weight: 93.7 kg Current weight: 99.2 kg  Per nursing assessment, pt with +2 pitting edema to BUE.  Medications reviewed and include: IV lasix, SSI q 4 hours, IV protonix, IV abx, TPN  Labs reviewed: hemoglobin 8.6 CBG's: 172-221 x 24 hours  UOP: 2950 ml x 24 hours R abd JP drain 1: 55 ml x 24 hours R abd JP drain 2: 255 ml x 24 hours I/O's: +6.3 L since admit  Diet Order:   Diet Order             Diet NPO time specified Except for: Ice Chips  Diet effective now                   EDUCATION NEEDS:   No education needs have been identified at this time  Skin:  Skin Assessment: Skin Integrity Issues: Incisions: abdomen  Last BM:  10/29/20 medium type 6  Height:   Ht Readings from Last 1 Encounters:  10/20/2020 5' 9"  (1.753 m)    Weight:   Wt Readings from Last 1 Encounters:  10/29/20 99.2 kg  BMI:  Body mass index is 32.3 kg/m.  Estimated Nutritional Needs:   Kcal:  8257-4935  Protein:  125-145 grams  Fluid:  >/= 2.0 L    Gustavus Bryant, MS, RD, LDN Inpatient Clinical Dietitian Please see AMiON for contact information.

## 2020-10-29 NOTE — Progress Notes (Signed)
Pharmacy Antibiotic Note  Paul Chambers is a 55 y.o. male admitted on November 03, 2020 with  infected necrotic pancreatitis and concern for possible pneumonia .  Pharmacy has been consulted for Merrem dosing. Zosyn discontinued (6 days of therapy). Fluconazole ordered through 9/16. Leukocytosis - now trending down with Merrem. Afebrile. Surgery note states possible plan for repeat CT scan of abdomen tomorrow.   Plan: Adjust Merrem to 1g IV every 8 hours.   Monitor renal function, clinical status, and culture results.   Height: 5\' 9"  (175.3 cm) Weight: 99.2 kg (218 lb 11.1 oz) IBW/kg (Calculated) : 70.7  Temp (24hrs), Avg:100.6 F (38.1 C), Min:100.1 F (37.8 C), Max:100.9 F (38.3 C)  Recent Labs  Lab 10/23/20 0910 10/23/20 1210 10/26/20 0401 10/26/20 0808 10/26/20 1056 10/26/20 1811 10/27/20 0500 10/28/20 0212 10/29/20 0630 10/29/20 1008  WBC  --    < >  --    < > 15.4* 17.8* 16.5* 16.7*  --  12.4*  CREATININE  --    < > 1.12  --   --  0.90 0.87 0.69 0.57*  --   LATICACIDVEN 1.8  --   --   --   --   --   --   --   --   --    < > = values in this interval not displayed.    Estimated Creatinine Clearance: 125.4 mL/min (A) (by C-G formula based on SCr of 0.57 mg/dL (L)).    No Known Allergies  Antimicrobials this admission: Vanco 9/8 >> 9/9 Zosyn 9/8 >>9/15 Metronidazole 9/8 >> 9/9 Fluconazole 9/8 >> 9/16 Linezolid 9/9>>9/14 Merrem 9/15 >>  Microbiology results: 9/9 MRSA PCR negative 9/8 Blood culture: negative 9/9 Blood culture: negative 9/15 Sputum culture- sent  Thank you for allowing pharmacy to be a part of this patient's care.  10/15, PharmD, BCPS, BCCCP Clinical Pharmacist Please refer to [facility] for [facility] Pharmacy numbers 10/29/2020 2:27 PM

## 2020-10-29 NOTE — Progress Notes (Signed)
Inpatient Rehab Admissions Coordinator Note:   Per PT recommendations patient was screened for CIR candidacy by Stephania Fragmin, PT. At this time, pt appears to be a potential candidate for CIR. I will place an order for rehab consult for full assessment, per our protocol.  Please contact me any with questions.Estill Dooms, PT, DPT [phone] 10/29/20 1:27 PM

## 2020-10-29 NOTE — Progress Notes (Signed)
Physical Therapy Treatment Patient Details Name: Paul Chambers MRN: [id number] DOB: December 12, 1965 Today's Date: 10/29/2020   History of Present Illness Pt adm 9/8 with abdominal pain and AMS. Pt found to have extensive pneumoperitoneum and underwent ex lap on 9/9. Pt with infected necrotizing pancreatitis. Pt with acute hypoxic respiratory failure. Pt intubated 9/9-9/12. PMH - etoh, HTN, gout, pancreatitis.    PT Comments    Wife out to find me with pt restless in chair about 15 minutes after left up in chair. Pt bringing legs off of legrest and trying to put legrest down. Assisted pt back to bed using the [facility]    Recommendations for follow up therapy are one component of a multi-disciplinary discharge planning process, led by the attending physician.  Recommendations may be updated based on patient status, additional functional criteria and insurance authorization.  Follow Up Recommendations  CIR     Equipment Recommendations  Other (comment) (To be assessed)    Recommendations for Other Services Rehab consult     Precautions / Restrictions Precautions Precautions: Fall;Other (comment) Precaution Comments: abdominal drains Restrictions Weight Bearing Restrictions: No     Mobility  Bed Mobility Overal bed mobility: Needs Assistance Bed Mobility: Sit to Supine      Sit to supine: Mod assist   General bed mobility comments: Assist to lower trunk and bring legs back up onto bed.    Transfers Overall transfer level: Needs assistance Equipment used: Ambulation equipment used Transfers: Sit to/from Stand Sit to Stand: Mod assist        General transfer comment: Assist to bring hips up from chair. Used Stedy for chair to bed.  Ambulation/Gait                Stairs             Wheelchair Mobility    Modified Rankin (Stroke Patients Only)       Balance Overall balance assessment: Needs assistance Sitting-balance support: No upper extremity  supported Sitting balance-Leahy Scale: Fair Sitting balance - Comments: UE support and min  to min guard sitting EOB.   Standing balance support: Bilateral upper extremity supported Standing balance-Leahy Scale: Poor Standing balance comment: Stedy and min assist for static standing                            Cognition Arousal/Alertness: Awake/alert Behavior During Therapy: Flat affect;Impulsive Overall Cognitive Status: Impaired/Different from baseline Area of Impairment: Orientation;Attention;Following commands;Problem solving;Safety/judgement;Awareness                 Orientation Level: Place;Time;Situation Current Attention Level: Sustained   Following Commands: Follows one step commands with increased time Safety/Judgement: Decreased awareness of safety;Decreased awareness of deficits Awareness: Intellectual Problem Solving: Slow processing;Difficulty sequencing;Requires verbal cues;Requires tactile cues       Exercises      General Comments General comments (skin integrity, edema, etc.): VSS on RA with SpO2 >91%      Pertinent Vitals/Pain Pain Assessment: No/denies pain Faces Pain Scale: Hurts even more Pain Location: lower back Pain Descriptors / Indicators: Tender Pain Intervention(s): Monitored during session    Home Living Family/patient expects to be discharged to:: Private residence Living Arrangements: Spouse/significant other Available Help at Discharge: Family;Available 24 hours/day Type of Home: Mobile home Home Access: Stairs to enter Entrance Stairs-Rails: Right Home Layout: One level Home Equipment: Walker - 4 wheels      Prior Function Level of Independence: Independent  Comments: Using rollator just prior to admission. Works an Personnel officer   PT Goals (current goals can now be found in the care plan section) Acute Rehab PT Goals Patient Stated Goal: get to the bathroom PT Goal Formulation: With patient/family Time For  Goal Achievement: 11/12/20 Potential to Achieve Goals: Good Progress towards PT goals: Progressing toward goals    Frequency    Min 3X/week      PT Plan Current plan remains appropriate    Co-evaluation          AM-PAC PT "6 Clicks" Mobility   Outcome Measure  Help needed turning from your back to your side while in a flat bed without using bedrails?: A Lot Help needed moving from lying on your back to sitting on the side of a flat bed without using bedrails?: Total Help needed moving to and from a bed to a chair (including a wheelchair)?: Total Help needed standing up from a chair using your arms (e.g., wheelchair or bedside chair)?: A Lot Help needed to walk in hospital room?: Total Help needed climbing 3-5 steps with a railing? : Total 6 Click Score: 8    End of Session Equipment Utilized During Treatment: Gait belt Activity Tolerance: Patient limited by fatigue Patient left: in bed;with bed alarm set;with call bell/phone within reach;with family/visitor present;with restraints reapplied Nurse Communication: Mobility status PT Visit Diagnosis: Unsteadiness on feet (R26.81);Other abnormalities of gait and mobility (R26.89);Muscle weakness (generalized) (M62.81)     Time: 0109-3235 PT Time Calculation (min) (ACUTE ONLY): 21 min  Charges:  $Therapeutic Exercise: 8-22 mins                     [facility] PT Acute Rehabilitation Services Pager [phone] Office [phone]    Angelina [facility] 10/29/2020, 2:19 PM

## 2020-10-30 DIAGNOSIS — R6520 Severe sepsis without septic shock: Secondary | ICD-10-CM | POA: Diagnosis not present

## 2020-10-30 DIAGNOSIS — A419 Sepsis, unspecified organism: Secondary | ICD-10-CM | POA: Diagnosis not present

## 2020-10-30 LAB — COMPREHENSIVE METABOLIC PANEL
ALT: 27 U/L (ref 0–44)
AST: 28 U/L (ref 15–41)
Albumin: 1.5 g/dL — ABNORMAL LOW (ref 3.5–5.0)
Alkaline Phosphatase: 119 U/L (ref 38–126)
Anion gap: 7 (ref 5–15)
BUN: 15 mg/dL (ref 6–20)
CO2: 29 mmol/L (ref 22–32)
Calcium: 8.6 mg/dL — ABNORMAL LOW (ref 8.9–10.3)
Chloride: 107 mmol/L (ref 98–111)
Creatinine, Ser: 0.52 mg/dL — ABNORMAL LOW (ref 0.61–1.24)
GFR, Estimated: >60 mL/min (ref >60)
Glucose, Bld: 174 mg/dL — ABNORMAL HIGH (ref 70–99)
Potassium: 3.9 mmol/L (ref 3.5–5.1)
Sodium: 143 mmol/L (ref 135–145)
Total Bilirubin: 0.8 mg/dL (ref 0.3–1.2)
Total Protein: 5.4 g/dL — ABNORMAL LOW (ref 6.5–8.1)

## 2020-10-30 LAB — GLUCOSE, CAPILLARY
Glucose-Capillary: 178 mg/dL — ABNORMAL HIGH (ref 70–99)
Glucose-Capillary: 196 mg/dL — ABNORMAL HIGH (ref 70–99)
Glucose-Capillary: 193 mg/dL — ABNORMAL HIGH (ref 70–99)
Glucose-Capillary: 190 mg/dL — ABNORMAL HIGH (ref 70–99)
Glucose-Capillary: 156 mg/dL — ABNORMAL HIGH (ref 70–99)
Glucose-Capillary: 154 mg/dL — ABNORMAL HIGH (ref 70–99)

## 2020-10-30 LAB — CBC WITH DIFFERENTIAL/PLATELET
Abs Immature Granulocytes: 0.07 10*3/uL (ref 0.00–0.07)
Basophils Absolute: 0 10*3/uL (ref 0.0–0.1)
Basophils Relative: 0 %
Eosinophils Absolute: 0 10*3/uL (ref 0.0–0.5)
Eosinophils Relative: 0 %
HCT: 26.9 % — ABNORMAL LOW (ref 39.0–52.0)
Hemoglobin: 8.3 g/dL — ABNORMAL LOW (ref 13.0–17.0)
Immature Granulocytes: 1 %
Lymphocytes Relative: 11 %
Lymphs Abs: 1.1 10*3/uL (ref 0.7–4.0)
MCH: 36.9 pg — ABNORMAL HIGH (ref 26.0–34.0)
MCHC: 30.9 g/dL (ref 30.0–36.0)
MCV: 119.6 fL — ABNORMAL HIGH (ref 80.0–100.0)
Monocytes Absolute: 1.3 10*3/uL — ABNORMAL HIGH (ref 0.1–1.0)
Monocytes Relative: 13 %
Neutro Abs: 7.3 10*3/uL (ref 1.7–7.7)
Neutrophils Relative %: 75 %
Platelets: 104 10*3/uL — ABNORMAL LOW (ref 150–400)
RBC: 2.25 MIL/uL — ABNORMAL LOW (ref 4.22–5.81)
RDW: 19.8 % — ABNORMAL HIGH (ref 11.5–15.5)
Smear Review: NORMAL
WBC: 9.8 10*3/uL (ref 4.0–10.5)
nRBC: 0.3 % — ABNORMAL HIGH (ref 0.0–0.2)

## 2020-10-30 LAB — EXPECTORATED SPUTUM ASSESSMENT W GRAM STAIN, RFLX TO RESP C

## 2020-10-30 LAB — PHOSPHORUS: Phosphorus: 2.6 mg/dL (ref 2.5–4.6)

## 2020-10-30 MED ORDER — HYDROMORPHONE HCL 1 MG/ML IJ SOLN
0.5000 mg | INTRAMUSCULAR | Status: DC | PRN
  Administered 2020-10-30 – 2020-11-14 (×72): 0.5 mg via INTRAVENOUS
  Filled 2020-10-30 (×74): qty 0.5

## 2020-10-30 MED ORDER — TRAVASOL 10 % IV SOLN
INTRAVENOUS | Status: AC
  Filled 2020-10-30: qty 1320

## 2020-10-30 MED ORDER — INSULIN ASPART 100 UNIT/ML IJ SOLN
3.0000 [IU] | INTRAMUSCULAR | Status: AC
  Administered 2020-10-30 (×2): 3 [IU] via SUBCUTANEOUS

## 2020-10-30 NOTE — Progress Notes (Signed)
PHARMACY - TOTAL PARENTERAL NUTRITION CONSULT NOTE   Indication:  severe pancreatitis  Patient Measurements: Height: 5\' 9"  (175.3 cm) Weight: 96.8 kg (213 lb 6.5 oz) IBW/kg (Calculated) : 70.7 TPN AdjBW (KG): 78 Body mass index is 31.51 kg/m.  Assessment: 5 yom presenting 9/8 with septic shock due to necrotizing pancreatitis with pneumoperitoneum s/p ex-lap 9/9. Pt transferred to ICU post-op, intubated and sedated. Pharmacy consulted to start TPN for severe pancreatitis. Pt at risk of refeeding with minimal PO intake x 1 week PTA, decreased PO intake x 1.5 months PTA with significant weight loss and hx heavy alcohol abuse.  Glucose / Insulin: A1c 6.8 (no meds pta). CBGs 170-200 with 60 units insulin R in TPN, 24 units SSI/24h *Received Dextrose in Phos replacement 9/15 AM  Electrolytes: Na 143, K 3.9, Phos 2.5, Cl/HCO3 wnl, CoCa 10.6 Renal: AKI resolved - SCr 0.52, BUN wnl Hepatic: LFTs / Tbili normalized, TG WNL, albumin 1.5; Triglycerides 97 Intake / Output; MIVF: NGT clamped 9/14, Abdominal JP drains x 2 output/24h: 560 cc (incr), UOP down 0.5 ml/kg/hr. LBM 9/16 GI Imaging:  9/8 CT A/P - extensive pneumoperitoneum 9/16 Repeat CT A/P - large amount of residual gas and fluid dissecting through the retroperitoneum, no evidence of enteric fistula or leak  GI Surgeries / Procedures: 9/9 ex-lap (no colonic perforation, large necrotic fluid present surrounding pancreas)  Central access: CVC 9/9 TPN start date: 9/10  Nutritional Goals: Goal TPN rate is 100 mL/hr (provides 132 g of protein and 2390 kcals per day) meeting 100% of needs  RD Assessment: Estimated Needs Total Energy Estimated Needs: 2350-2550 Total Protein Estimated Needs: 125-145 grams Total Fluid Estimated Needs: >/= 2.0 L  Current Nutrition:  NPO and TPN  Plan:  Continue TPN to 100 mL/hr at 1800  Electrolytes in TPN: Na to 14mEq/L, incr K to 40 meq/L; cont Phos 10 meq/L, red Ca to 72mEq/L, cont Mg 5 mEq/L. Cl:Ac to  1:1  Add standard MVI, trace elements, folic acid 1mg , and thiamine 100mg  to TPN Continue Resistant q4h SSI Increase insulin R in TPN bag to 70 units  Will supplement an additional novolog 3 units q4h x 2 until insulin in TPN increased this evening to keep CBGs<180 Monitor TPN labs, BMET/Mg/Phos in the AM  Thank you for allowing pharmacy to be a part of this patient's care.  11m, PharmD, BCPS Clinical Pharmacist Clinical phone for 10/30/2020: 10/30/2020 8:17 AM   **Pharmacist phone directory can now be found on [email or web address] ([email or web address]).  Listed under [facility].

## 2020-10-30 NOTE — Progress Notes (Signed)
Speech Language Pathology Treatment: Dysphagia  Patient Details Name: Paul Chambers MRN: [id number] DOB: 05/17/1965 Today's Date: 10/30/2020 Time: 4970-2637 SLP Time Calculation (min) (ACUTE ONLY): 31 min  Assessment / Plan / Recommendation Clinical Impression  Pt seen to address dysphagia goals.  SLP provided oral care to pt using toothbrush and oral suction.  Per RN, pt with weak cough and having difficulty clearing secretions. With SLP provided pt with pillow to abdomen, pt able to perform strong cough.  Intake consisted of ice chips and water via tsp and cup.     Pt with cough x2 of 6 ice chip boluses and x3/5 water boluses - most notably exacerbated via cup.  Cough was productive x1 to viscous secretions - which wife advises pt coughs on secretions "all of the time".    At this time, recommend to advance to allow ice chips and tsps of water after mouth care.    Reviewed importance of oral care but also po ice/water decreasing swallow musculature atrophy and helping with hygiene.  strength Educated wife Paul Chambers to recommendations and demonstrated use of oral care.    Encouraged pillow pressure (as long as surgery approves) to maximize cough effort for airway protection.  Will follow up Monday 11/01/2020 for readiness for dietary advancement.  Anticipate pt will be appropriate to transition into po early next week.    HPI HPI: 55 y.o. Caucasian male smoker presented to the [facility] Emergency Department via private vehicle with complaints of abdominal pain and altered mental status. 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 ETT 9/9 to 9/12. Pt with post op ileus. Pulled NG, SLP consulted for clinical swallow assessment with clear liquids.      SLP Plan  Continue with current plan of care      Recommendations for follow up therapy are one component of a multi-disciplinary discharge planning process, led by the attending physician.  Recommendations may be  updated based on patient status, additional functional criteria and insurance authorization.    Recommendations  Medication Administration: Via alternative means                Oral Care Recommendations: Oral care prior to ice chip/H20;Oral care QID Follow up Recommendations: Other (comment) (TBD) SLP Visit Diagnosis: Dysphagia, unspecified (R13.10) Plan: Continue with current plan of care       GO               Paul Infante, MS [facility] SLP Acute Rehab Services Office [phone] Pager [phone]  Paul Chambers  10/30/2020, 12:32 PM

## 2020-10-30 NOTE — Progress Notes (Signed)
[facility] Progress Note  9 Days Post-Op  Subjective: CC-  No acute events overnight.  Denies abdominal pain or nausea this morning.  Objective: Vital signs in last 24 hours: Temp:  [99 F (37.2 C)-101.1 F (38.4 C)] 99 F (37.2 C) (09/17 0749) Pulse Rate:  [86-107] 89 (09/17 0600) Resp:  [21-32] 25 (09/17 0600) BP: (140-164)/(79-119) 148/90 (09/17 0600) SpO2:  [90 %-98 %] 98 % (09/17 0600) Weight:  [96.8 kg] 96.8 kg (09/17 0332) Last BM Date: 10/29/20  Intake/Output from previous day: 09/16 0701 - 09/17 0700 In: 3161.4 [I.V.:2061.3; NG/GT:1000; IV Piggyback:100.1] Out: 1785 [Urine:1125; Emesis/NG output:100; Drains:560] Intake/Output this shift: No intake/output data recorded.  PE: Gen:  Alert but drowsy, NAD Pulm:  Normal rate and effort  Abd: Distended but soft, generalized tenderness without rigidity or guarding. RUQ drains x2 with brown fluid in bulbs. Midline wound pale pink with early granulation, and clean without dehiscence.    Lab Results:  Recent Labs    10/29/20 1008 10/30/20 0328  WBC 12.4* 9.8  HGB 8.6* 8.3*  HCT 26.8* 26.9*  PLT 114* 104*   BMET Recent Labs    10/29/20 0630 10/30/20 0503  NA 143 143  K 3.7 3.9  CL 108 107  CO2 29 29  GLUCOSE 177* 174*  BUN 13 15  CREATININE 0.57* 0.52*  CALCIUM 8.7* 8.6*   PT/INR No results for input(s): LABPROT, INR in the last 72 hours. CMP     Component Value Date/Time   NA 143 10/30/2020 0503   K 3.9 10/30/2020 0503   CL 107 10/30/2020 0503   CO2 29 10/30/2020 0503   GLUCOSE 174 (H) 10/30/2020 0503   BUN 15 10/30/2020 0503   CREATININE 0.52 (L) 10/30/2020 0503   CALCIUM 8.6 (L) 10/30/2020 0503   PROT 5.4 (L) 10/30/2020 0503   ALBUMIN 1.5 (L) 10/30/2020 0503   AST 28 10/30/2020 0503   ALT 27 10/30/2020 0503   ALKPHOS 119 10/30/2020 0503   BILITOT 0.8 10/30/2020 0503   GFRNONAA >60 10/30/2020 0503   Lipase     Component Value Date/Time   LIPASE 25 10/23/2020 1506        Studies/Results: CT ABDOMEN PELVIS W CONTRAST  Result Date: 10/30/2020 CLINICAL DATA:  Abdominal pain. EXAM: CT ABDOMEN AND PELVIS WITH CONTRAST TECHNIQUE: Multidetector CT imaging of the abdomen and pelvis was performed using the standard protocol following bolus administration of intravenous contrast. CONTRAST:  OMNIPAQUE IOHEXOL 300 MG/ML  SOLN COMPARISON:  October 21, 2020 FINDINGS: Lower chest: Moderate to marked severity areas of consolidation are seen within the visualized portions of the bilateral lower lobes. This is increased in severity when compared to the prior study. There is a small right pleural effusion. A moderate to large left pleural effusion is also seen. These are increased in size when compared to the prior exam. Hepatobiliary: No focal liver abnormality is seen. No gallstones, gallbladder wall thickening, or biliary dilatation. Pancreas: The pancreas remains poorly visualized. Spleen: Normal in size without focal abnormality. Adrenals/Urinary Tract: Adrenal glands are unremarkable. Kidneys are normal, without renal calculi, focal lesion, or hydronephrosis. Bladder is unremarkable. Stomach/Bowel: A nasogastric tube is in place. Stomach is within normal limits. Appendix appears normal. No evidence of bowel dilatation. Radiopaque contrast is seen throughout the large and small bowel without evidence of contrast extravasation. Two surgical drains are seen entering via the anterolateral aspect of the mid right abdominal wall. The distal tip of the first catheter sits just above  the gallbladder fossa, while the distal tip of the second catheter is seen within the region posterior to the gastric fundus. A large amount of gas and fluid is again seen dissecting through the retroperitoneum. This extends from the posteromedial aspect of the left upper quadrant throughout the mid and left abdomen into the posterior aspect of the pelvis and is decreased in severity when compared to  the prior study. Vascular/Lymphatic: No significant vascular findings are present. No enlarged abdominal or pelvic lymph nodes. Reproductive: Prostate is unremarkable. Other: Two surgical drains are seen entering via the anterolateral aspect of the mid right abdominal wall. This represents a new finding. The distal tip of the first catheter sits just above the gallbladder fossa, while the distal tip of the second catheter is seen within the region posterior to the gastric fundus. A large amount of gas and fluid is again seen dissecting through the retroperitoneum. This extends from the posteromedial aspect of the left upper quadrant throughout the mid and left abdomen into the posterior aspect of the pelvis and is decreased in severity when compared to the prior study. Moderate severity diffuse mesenteric inflammatory fat stranding is noted, most prominent within the mid and upper abdomen. A mild-to-moderate amount of ascites is seen. Musculoskeletal: No acute or significant osseous findings. IMPRESSION: 1. Interval abdominal surgical drain placement and positioning, as described above, with a large amount of residual gas and fluid dissecting through the retroperitoneum. 2. Moderate to marked severity bilateral lower lobe consolidation with bilateral pleural effusions, left greater than right. 3. Mild to moderate amount of ascites. Electronically Signed   By: Aram Candela M.D.   On: 10/30/2020 00:25   DG CHEST PORT 1 VIEW  Result Date: 10/28/2020 CLINICAL DATA:  Dyspnea. EXAM: PORTABLE CHEST 1 VIEW COMPARISON:  October 22, 2020. FINDINGS: Stable cardiomegaly. Interval placement of right-sided PICC line with distal tip in expected position of cavoatrial junction. Right lung is clear. Endotracheal and nasogastric tubes have been removed. Left midlung and basilar opacity is noted concerning for pneumonia or atelectasis with associated pleural effusion. Bony thorax is unremarkable. IMPRESSION: Increased left  lung opacity is noted concerning for pneumonia or atelectasis with associated left pleural effusion. Electronically Signed   By: Lupita Raider M.D.   On: 10/28/2020 12:33   DG Abd Portable 1V  Result Date: 10/29/2020 CLINICAL DATA:  Feeding tube placement. EXAM: PORTABLE ABDOMEN - 1 VIEW COMPARISON:  One-view abdomen 10/26/2020 FINDINGS: Tip of a small bore feeding tube is in the distal stomach. Are gas pattern is unremarkable. Heart is enlarged. Left lower lobe airspace disease is again seen. IMPRESSION: Tip of small bore feeding tube in the distal stomach. Electronically Signed   By: Marin Roberts M.D.   On: 10/29/2020 14:21   ECHOCARDIOGRAM COMPLETE  Result Date: 10/28/2020    ECHOCARDIOGRAM REPORT   Patient Name:   TIWAN SCHNITKER Date of Exam: 10/28/2020 Medical Rec #:  [id number]    Height:       69.0 in Accession #:    [id number]   Weight:       225.3 lb Date of Birth:  12-06-1965   BSA:          2.173 m Patient Age:    54 years     BP:           132/84 mmHg Patient Gender: M            HR:  103 bpm. Exam Location:  Inpatient Procedure: 2D Echo Indications:    acute systolic chf  History:        Patient has no prior history of Echocardiogram examinations.                 Sepsis, Signs/Symptoms:Altered Mental Status; Risk                 Factors:Current Smoker.  Sonographer:    Delcie Roch RDCS Referring Phys: [id number] RAVI PAHWANI  Sonographer Comments: Image acquisition challenging due to uncooperative patient. IMPRESSIONS  1. Left ventricular ejection fraction, by estimation, is 65 to 70%. The left ventricle has normal function. The left ventricle has no regional wall motion abnormalities. The left ventricular internal cavity size was mildly dilated. Left ventricular diastolic parameters were normal.  2. Right ventricular systolic function is normal. The right ventricular size is normal.  3. The mitral valve is normal in structure. Trivial mitral valve regurgitation.  4. The  aortic valve is normal in structure. Aortic valve regurgitation is not visualized. FINDINGS  Left Ventricle: Left ventricular ejection fraction, by estimation, is 65 to 70%. The left ventricle has normal function. The left ventricle has no regional wall motion abnormalities. The left ventricular internal cavity size was mildly dilated. There is  no left ventricular hypertrophy. Left ventricular diastolic parameters were normal. Right Ventricle: The right ventricular size is normal. Right vetricular wall thickness was not assessed. Right ventricular systolic function is normal. Left Atrium: Left atrial size was normal in size. Right Atrium: Right atrial size was normal in size. Pericardium: There is no evidence of pericardial effusion. Mitral Valve: The mitral valve is normal in structure. Trivial mitral valve regurgitation. Tricuspid Valve: The tricuspid valve is normal in structure. Tricuspid valve regurgitation is trivial. Aortic Valve: The aortic valve is normal in structure. Aortic valve regurgitation is not visualized. Pulmonic Valve: The pulmonic valve was normal in structure. Pulmonic valve regurgitation is not visualized. Aorta: The aortic root and ascending aorta are structurally normal, with no evidence of dilitation. Venous: The inferior vena cava was not well visualized. IAS/Shunts: No atrial level shunt detected by color flow Doppler.  LEFT VENTRICLE PLAX 2D LVIDd:         5.40 cm  Diastology LVIDs:         3.50 cm  LV e' medial:    8.05 cm/s LV PW:         0.90 cm  LV E/e' medial:  10.2 LV IVS:        0.70 cm  LV e' lateral:   10.30 cm/s LVOT diam:     1.90 cm  LV E/e' lateral: 8.0 LV SV:         55 LV SV Index:   25 LVOT Area:     2.84 cm  RIGHT VENTRICLE RV S prime:     20.10 cm/s TAPSE (M-mode): 1.8 cm LEFT ATRIUM           Index       RIGHT ATRIUM           Index LA diam:      3.00 cm 1.38 cm/m  RA Area:     13.20 cm LA Vol (A2C): 34.7 ml 15.97 ml/m RA Volume:   29.40 ml  13.53 ml/m LA Vol  (A4C): 33.1 ml 15.23 ml/m  AORTIC VALVE LVOT Vmax:   133.00 cm/s LVOT Vmean:  88.100 cm/s LVOT VTI:    0.195 m  AORTA Ao Root  diam: 3.30 cm Ao Asc diam:  3.20 cm MV E velocity: 82.00 cm/s MV A velocity: 110.00 cm/s  SHUNTS MV E/A ratio:  0.75         Systemic VTI:  0.20 m                             Systemic Diam: 1.90 cm Dietrich Pates MD Electronically signed by Dietrich Pates MD Signature Date/Time: 10/28/2020/5:29:43 PM    Final     Anti-infectives: Anti-infectives (From admission, onward)    Start     Dose/Rate Route Frequency Ordered Stop   10/29/20 2200  meropenem (MERREM) 1 g in sodium chloride 0.9 % 100 mL IVPB        1 g 200 mL/hr over 30 Minutes Intravenous Every 8 hours 10/29/20 1428     10/28/20 1615  meropenem (MERREM) 2 g in sodium chloride 0.9 % 100 mL IVPB  Status:  Discontinued        2 g 200 mL/hr over 30 Minutes Intravenous Every 8 hours 10/28/20 1515 10/29/20 1428   10/28/20 1445  metroNIDAZOLE (FLAGYL) IVPB 500 mg  Status:  Discontinued        500 mg 100 mL/hr over 60 Minutes Intravenous Every 12 hours 10/28/20 1348 10/28/20 1505   10/25/20 2300  fluconazole (DIFLUCAN) IVPB 400 mg        400 mg 100 mL/hr over 120 Minutes Intravenous Every 24 hours 10/25/20 0742 10/29/20 0718   10/24/20 2300  fluconazole (DIFLUCAN) IVPB 200 mg  Status:  Discontinued        200 mg 100 mL/hr over 60 Minutes Intravenous Every 24 hours 10/24/20 0943 10/25/20 0742   10/22/20 1800  vancomycin (VANCOREADY) IVPB 1500 mg/300 mL  Status:  Discontinued        1,500 mg 150 mL/hr over 120 Minutes Intravenous Every 24 hours 11/11/2020 2225 10/22/20 0759   10/22/20 0900  linezolid (ZYVOX) IVPB 600 mg  Status:  Discontinued        600 mg 300 mL/hr over 60 Minutes Intravenous Every 12 hours 10/22/20 0800 10/27/20 0852   10/22/20 0300  piperacillin-tazobactam (ZOSYN) IVPB 3.375 g  Status:  Discontinued        3.375 g 12.5 mL/hr over 240 Minutes Intravenous Every 8 hours November 11, 2020 2225 10/28/20 1514   11/11/2020  2335  metroNIDAZOLE (FLAGYL) IVPB 500 mg  Status:  Discontinued        500 mg 100 mL/hr over 60 Minutes Intravenous Every 12 hours November 11, 2020 2142 10/22/20 0747   11-11-2020 2248  fluconazole (DIFLUCAN) IVPB 400 mg  Status:  Discontinued        400 mg 100 mL/hr over 120 Minutes Intravenous Every 24 hours 11-11-20 2142 10/24/20 0943   11-11-2020 1748  vancomycin (VANCOREADY) IVPB 2000 mg/400 mL        2,000 mg 200 mL/hr over 120 Minutes Intravenous  Once November 11, 2020 1706 2020-11-11 2113   2020/11/11 1715  piperacillin-tazobactam (ZOSYN) IVPB 3.375 g        3.375 g 100 mL/hr over 30 Minutes Intravenous  Once 11-11-20 1706 11-11-20 1903        Assessment/Plan POD 8 s/p ex lap with pancreatic debridement for infected necrotic pancreatitis by Dr. Sheliah Hatch on 9/9 - Superior drain under the mesocolon along with the head of the pancreas - Inferior drain under the mesocolon along the left upper abdomen and what is likely a position inferior to the pancreas -  Continue drains and monitor - NPO except ice chips 9/15 per SLP - WBC normal this morning.  Intermittent low-grade temps. He was switched to [facility] 9/15 from zosyn by primary team for worsening CXR/ pneumonia  -CT 9/17 demonstrates large amount of residual gas and fluid dissecting through the retroperitoneum, no evidence of enteric fistula or leak - Cont TPN for nutrition, continue current supportive measures for ongoing sequelae of severe infected necrotizing pancreatitis.   FEN: TPN, ice chips ID: merrem VTE: heparin subq Foley - out, voiding    - Per primary -  VDRF - now extubated Abl anemia - s/p 1U PRBC 9/13 w/ appropriate response. ETOH use - weaned off precedex. Consider CIWA AKI - resolved, good uop T2DM   LOS: 9 days    Berna Bue, MD [facility] 10/30/2020, 8:36 AM Please see Amion for pager number during day hours 7:00am-4:30pm

## 2020-10-30 NOTE — Progress Notes (Signed)
PROGRESS NOTE    Paul Chambers  ZOX:[id number] DOB: 1966-02-11 DOA: Nov 01, 2020 PCP: Pcp, No   Brief Narrative:  This 55 y.o. Caucasian male smoker presented to the [facility] Emergency Department via private vehicle with complaints of abdominal pain and altered mental status. The patient's wife reports that this is the third presentation to a healthcare facility in the past month for abdominal pain ([facility], [facility] and now [facility]).  The patient's deteriorating mental status over the past 2 days prompted the wife to force the patient to come to the hospital.  In the ER, CT head was negative for acute process, but CT abdomen showed an impressive pneumoperitoneum without an obvious location of perforation.  Surgery evaluated the patient and decided for exploratory laparotomy which was performed on 10/22/2020.  Sequence of events as below. Significant Hospital Events: Including procedures, antibiotic start and stop dates in addition to other pertinent events   9/8 - Presented to [facility] with abdominal pain. CT A/P with extensive pneumoperitoneum 9/9 - Ex-lap (no colonic perforation discovered, large necrotic fluid present surrounding pancreas. Transferred to ICU post-op, intubated and sedated.  9/10-received 1 unit PRBC for hemoglobin 6.7, went back on Levophed for short period of time 9/12 - Extubated to Venturi mask 9/13 - Off Fentanyl and Propofol gtt 10/28/20.  Patient transferred under [facility].  Assessment & Plan:   Principal Problem:   Severe sepsis (HCC) Active Problems:   Acute necrotizing pancreatitis   Toxic encephalopathy   Hyponatremia   Hypoalbuminemia   Macrocytic anemia   Pressure injury of skin   Protein-calorie malnutrition, severe   Infected Necrotizing Pancreatitis with Pneumoperitoneum s/p ex-lap by Dr. Drexel Iha on 10/22/2020: - General surgery following; appreciate their recommendations.  - Continue IV antibiotics with Zosyn and Fluconazole, 7  day planned - Discontinued Linezolid, as he has completed course.  - Continue TPN.  He pulled out his NG tube on 10/27/2020. Continues to have fever, had 101 fever around 8 PM last night.  CT abdomen repeated yesterday by general surgery shows large amount of residual gas and fluid dissecting through the retroperitoneum, no evidence of enteric fistula or leak.  Patient remains on antibiotics and supportive therapy, management per general surgery.   Acute respiratory failure with hypoxia/possible aspiration pneumonia/congestive heart failure/left pleural effusion: Intubated 10/22/2020 and extubated 10/26/2020.  Currently on 3 L oxygen.  Repeat chest x-ray 10/28/2020 shows worsening/increased left lung opacity concerning for pneumonia or atelectasis and associated left pleural effusion.  Repeat procalcitonin improved significantly.  Lung exam improving.  Continue Lasix.  Try to wean oxygen.   Acute toxic-metabolic encephalopathy: Sepsis +/- ICU delirium: He is more alert and oriented today.  Maintain delirium precautions.  Ileus: Resolving.  Managed by general surgery.   Acute hepatitis: Stable. 2/2 to TPN - CMP daily    Alcohol abuse: Out of the window for withdrawal complications.   - Continue thiamine and folate   Acute Blood Loss Anemia, postsurgical: S/p 1 unit of pRBCs on 9/10 with stabilization in hemoglobin since.  - Monitor H&H and transfuse if less than 7   Severe protein calorie malnutrition - Continue TPN   Diabetes type 2 with hyperglycemia: Hemoglobin A1c was 6.8% on admission.  Blood sugar controlled.  He is not on any Lantus.  Continue SSI.   Hypokalemia: Resolved.  Hypophosphatemia: Replenished.   DVT prophylaxis: heparin injection 5,000 Units Start: 10/22/20 1400 Place and maintain sequential compression device Start: 11-01-20 2202 SCDs Start: 2020/11/01 2145   Code Status:  Full Code  Family Communication: None present at bedside.   Status is: Inpatient  Remains  inpatient appropriate because:Inpatient level of care appropriate due to severity of illness  Dispo: The patient is from: Home              Anticipated d/c is to: SNF              Patient currently is not medically stable to d/c.   Difficult to place patient No        Estimated body mass index is 32.2 kg/m as calculated from the following:   Height as of this encounter: 5\' 9"  (1.753 m).   Weight as of this encounter: 98.9 kg.     Nutritional Assessment: Body mass index is 32.2 kg/m. Seen by dietician.  I agree with the assessment and plan as outlined below: Nutrition Status: Nutrition Problem: Severe Malnutrition Etiology: acute illness (infected necrotic pancreatitis) Signs/Symptoms: moderate fat depletion, moderate muscle depletion, energy intake < or equal to 50% for > or equal to 5 days, percent weight loss (8.4% weight loss in 1.5 months) Percent weight loss: 8.4 % (1.5 months) Interventions: Refer to RD note for recommendations  .  Skin Assessment: I have examined the patient's skin and I agree with the wound assessment as performed by the wound care RN as outlined below:    Consultants:  General surgery  Procedures:  As above  Antimicrobials:  Anti-infectives (From admission, onward)    Start     Dose/Rate Route Frequency Ordered Stop   10/29/20 2200  meropenem (MERREM) 1 g in sodium chloride 0.9 % 100 mL IVPB        1 g 200 mL/hr over 30 Minutes Intravenous Every 8 hours 10/29/20 1428     10/28/20 1615  meropenem (MERREM) 2 g in sodium chloride 0.9 % 100 mL IVPB  Status:  Discontinued        2 g 200 mL/hr over 30 Minutes Intravenous Every 8 hours 10/28/20 1515 10/29/20 1428   10/28/20 1445  metroNIDAZOLE (FLAGYL) IVPB 500 mg  Status:  Discontinued        500 mg 100 mL/hr over 60 Minutes Intravenous Every 12 hours 10/28/20 1348 10/28/20 1505   10/25/20 2300  fluconazole (DIFLUCAN) IVPB 400 mg        400 mg 100 mL/hr over 120 Minutes Intravenous Every  24 hours 10/25/20 0742 10/29/20 0718   10/24/20 2300  fluconazole (DIFLUCAN) IVPB 200 mg  Status:  Discontinued        200 mg 100 mL/hr over 60 Minutes Intravenous Every 24 hours 10/24/20 0943 10/25/20 0742   10/22/20 1800  vancomycin (VANCOREADY) IVPB 1500 mg/300 mL  Status:  Discontinued        1,500 mg 150 mL/hr over 120 Minutes Intravenous Every 24 hours 10/23/2020 2225 10/22/20 0759   10/22/20 0900  linezolid (ZYVOX) IVPB 600 mg  Status:  Discontinued        600 mg 300 mL/hr over 60 Minutes Intravenous Every 12 hours 10/22/20 0800 10/27/20 0852   10/22/20 0300  piperacillin-tazobactam (ZOSYN) IVPB 3.375 g  Status:  Discontinued        3.375 g 12.5 mL/hr over 240 Minutes Intravenous Every 8 hours 10/24/2020 2225 10/28/20 1514   11/02/2020 2335  metroNIDAZOLE (FLAGYL) IVPB 500 mg  Status:  Discontinued        500 mg 100 mL/hr over 60 Minutes Intravenous Every 12 hours 11/11/2020 2142 10/22/20 0747   10/30/2020 2248  fluconazole (DIFLUCAN) IVPB 400 mg  Status:  Discontinued        400 mg 100 mL/hr over 120 Minutes Intravenous Every 24 hours 10/22/2020 2142 10/24/20 0943   11/09/2020 1748  vancomycin (VANCOREADY) IVPB 2000 mg/400 mL        2,000 mg 200 mL/hr over 120 Minutes Intravenous  Once 10/18/2020 1706 10/29/2020 2113   10/16/2020 1715  piperacillin-tazobactam (ZOSYN) IVPB 3.375 g        3.375 g 100 mL/hr over 30 Minutes Intravenous  Once 10/26/2020 1706 10/24/2020 1903          Subjective: Seen and examined.  Wife was not present at bedside.  Patient was more alert and oriented compared to yesterday and looks comfortable.  Did not have any complaints.  No abdominal pain.  Objective: Vitals:   10/30/20 1100 10/30/20 1111 10/30/20 1158 10/30/20 1200  BP: (!) 143/90   (!) 150/84  Pulse: (!) 105   (!) 109  Resp:    17  Temp:   98.4 F (36.9 C)   TempSrc:   Oral   SpO2:    98%  Weight:  98.9 kg    Height:        Intake/Output Summary (Last 24 hours) at 10/30/2020 1443 Last data filed at  10/30/2020 1427 Gross per 24 hour  Intake 3339.58 ml  Output 2640 ml  Net 699.58 ml    Filed Weights   10/29/20 0500 10/30/20 0332 10/30/20 1111  Weight: 99.2 kg 96.8 kg 98.9 kg    Examination:  General exam: Appears calm and comfortable  Respiratory system: Rhonchi and crackles bilaterally but improved compared to yesterday. Respiratory effort normal. Cardiovascular system: S1 & S2 heard, RRR. No JVD, murmurs, rubs, gallops or clicks.  Trace pitting edema bilateral lower extremity Gastrointestinal system: Abdomen is nondistended, soft and nontender. No organomegaly or masses felt. Normal bowel sounds heard. Central nervous system: Alert and oriented x3. No focal neurological deficits. Extremities: Symmetric 5 x 5 power. Skin: No rashes, lesions or ulcers.     Data Reviewed: I have personally reviewed following labs and imaging studies  CBC: Recent Labs  Lab 10/26/20 0808 10/26/20 1056 10/26/20 1811 10/27/20 0500 10/28/20 0212 10/29/20 1008 10/30/20 0328  WBC 16.7*   < > 17.8* 16.5* 16.7* 12.4* 9.8  NEUTROABS 15.0*  --   --  15.7* 15.2* 10.3* 7.3  HGB 6.5*   < > 9.2* 8.2* 8.6* 8.6* 8.3*  HCT 20.4*   < > 28.3* 25.5* 27.3* 26.8* 26.9*  MCV 113.3*   < > 104.0* 104.1* 106.2* 105.9* 119.6*  PLT 150   < > 169 158 140* 114* 104*   < > = values in this interval not displayed.    Basic Metabolic Panel: Recent Labs  Lab 10/24/20 0349 10/24/20 1154 10/25/20 0328 10/26/20 0401 10/26/20 1811 10/27/20 0500 10/28/20 0212 10/29/20 0630 10/30/20 0503  NA 128*   < > 132* 135 136 138 143 143 143  K 4.7   < > 4.5 3.5 3.5 3.3* 3.5 3.7 3.9  CL 92*  --  97* 101 101 104 108 108 107  CO2 24  --  GLUCOSE 285*  --  199* 123* 223* 252* 154* 177* 174*  BUN 51*  --  51* 37* 27* 22* CREATININE 2.18*  --  1.76* 1.12 0.90 0.87 0.69 0.57* 0.52*  CALCIUM 7.8*  --  8.0* 8.1* 8.3* 8.3* 8.7* 8.7*  8.6*  MG 2.6*  --  2.4 2.2  --  2.1 2.0  --   --   PHOS 6.3*   --  4.0 2.3*  --  2.6 2.1*  --  2.6   < > = values in this interval not displayed.    GFR: Estimated Creatinine Clearance: 125.3 mL/min (A) (by C-G formula based on SCr of 0.52 mg/dL (L)). Liver Function Tests: Recent Labs  Lab 10/26/20 0401 10/27/20 0500 10/28/20 0212 10/29/20 0630 10/30/20 0503  AST 84* 44* 35 28 28  ALT 46* 35 ALKPHOS 190* 130* 141* 147* 119  BILITOT 1.0 1.0 0.9 0.6 0.8  PROT 5.3* 5.3* 5.3* 5.6* 5.4*  ALBUMIN 1.5* 1.5* 1.5* 1.5* 1.5*    No results for input(s): LIPASE, AMYLASE in the last 168 hours.  No results for input(s): AMMONIA in the last 168 hours.  Coagulation Profile: No results for input(s): INR, PROTIME in the last 168 hours.  Cardiac Enzymes: No results for input(s): CKTOTAL, CKMB, CKMBINDEX, TROPONINI in the last 168 hours.  BNP (last 3 results) No results for input(s): PROBNP in the last 8760 hours. HbA1C: No results for input(s): HGBA1C in the last 72 hours. CBG: Recent Labs  Lab 10/29/20 1948 10/29/20 2313 10/30/20 0326 10/30/20 0726 10/30/20 1131  GLUCAP 184* 190* 178* 196* 193*    Lipid Profile: No results for input(s): CHOL, HDL, LDLCALC, TRIG, CHOLHDL, LDLDIRECT in the last 72 hours. Thyroid Function Tests: No results for input(s): TSH, T4TOTAL, FREET4, T3FREE, THYROIDAB in the last 72 hours. Anemia Panel: No results for input(s): VITAMINB12, FOLATE, FERRITIN, TIBC, IRON, RETICCTPCT in the last 72 hours. Sepsis Labs: Recent Labs  Lab 10/28/20 1347  PROCALCITON 2.38     Recent Results (from the past 240 hour(s))  Resp Panel by RT-PCR (Flu A&B, Covid) Nasopharyngeal Swab     Status: None   Collection Time: 11/02/2020  8:49 PM   Specimen: Nasopharyngeal Swab; Nasopharyngeal(NP) swabs in vial transport medium  Result Value Ref Range Status   SARS Coronavirus 2 by RT PCR NEGATIVE NEGATIVE Final    Comment: (NOTE) SARS-CoV-2 target nucleic acids are NOT DETECTED.  The SARS-CoV-2 RNA is generally detectable  in upper respiratory specimens during the acute phase of infection. The lowest concentration of SARS-CoV-2 viral copies this assay can detect is 138 copies/mL. A negative result does not preclude SARS-Cov-2 infection and should not be used as the sole basis for treatment or other patient management decisions. A negative result may occur with  improper specimen collection/handling, submission of specimen other than nasopharyngeal swab, presence of viral mutation(s) within the areas targeted by this assay, and inadequate number of viral copies(<138 copies/mL). A negative result must be combined with clinical observations, patient history, and epidemiological information. The expected result is Negative.  Fact Sheet for Patients:  BloggerCourse.com  Fact Sheet for Healthcare Providers:  SeriousBroker.it  This test is no t yet approved or cleared by the [place] FDA and  has been authorized for detection and/or diagnosis of SARS-CoV-2 by FDA under an Emergency Use Authorization (EUA). This EUA will remain  in effect (meaning this test can be used) for the duration of the COVID-19 declaration under Section 564(b)(1) of the Act, 21 U.S.C.section 360bbb-3(b)(1), unless the authorization is terminated  or revoked sooner.       Influenza A by PCR NEGATIVE NEGATIVE Final   Influenza B by PCR NEGATIVE NEGATIVE Final    Comment: (NOTE) The Xpert Xpress SARS-CoV-2/FLU/RSV plus  assay is intended as an aid in the diagnosis of influenza from Nasopharyngeal swab specimens and should not be used as a sole basis for treatment. Nasal washings and aspirates are unacceptable for Xpert Xpress SARS-CoV-2/FLU/RSV testing.  Fact Sheet for Patients: BloggerCourse.com  Fact Sheet for Healthcare Providers: SeriousBroker.it  This test is not yet approved or cleared by the [place] FDA and has  been authorized for detection and/or diagnosis of SARS-CoV-2 by FDA under an Emergency Use Authorization (EUA). This EUA will remain in effect (meaning this test can be used) for the duration of the COVID-19 declaration under Section 564(b)(1) of the Act, 21 U.S.C. section 360bbb-3(b)(1), unless the authorization is terminated or revoked.  Performed at [facility], [address] [place], [place] [address]   Culture, blood (routine x 2)     Status: None   Collection Time: 10/31/2020 10:17 PM   Specimen: BLOOD  Result Value Ref Range Status   Specimen Description BLOOD LEFT ANTECUBITAL  Final   Special Requests   Final    BOTTLES DRAWN AEROBIC AND ANAEROBIC Blood Culture adequate volume   Culture   Final    NO GROWTH 5 DAYS Performed at [facility], [address] [place], [place] [address]    Report Status 10/26/2020 FINAL  Final  MRSA Next Gen by PCR, Nasal     Status: None   Collection Time: 10/22/20  2:00 AM  Result Value Ref Range Status   MRSA by PCR Next Gen NOT DETECTED NOT DETECTED Final    Comment: (NOTE) The GeneXpert MRSA Assay (FDA approved for NASAL specimens only), is one component of a comprehensive MRSA colonization surveillance program. It is not intended to diagnose MRSA infection nor to guide or monitor treatment for MRSA infections. Test performance is not FDA approved in patients less than 13 years old. Performed at [facility], [address], [place], [place] [address]   Culture, blood (routine x 2)     Status: None   Collection Time: 10/22/20  3:52 AM   Specimen: BLOOD  Result Value Ref Range Status   Specimen Description BLOOD LEFT ANTECUBITAL  Final   Special Requests   Final    BOTTLES DRAWN AEROBIC AND ANAEROBIC Blood Culture adequate volume   Culture   Final    NO GROWTH 5 DAYS Performed at [facility], [address] [place], [place] [address]    Report Status 10/27/2020 FINAL  Final  Expectorated Sputum  Assessment w Gram Stain, Rflx to Resp Cult     Status: None (Preliminary result)   Collection Time: 10/28/20  2:57 PM   Specimen: Expectorated Sputum  Result Value Ref Range Status   Specimen Description EXPECTORATED SPUTUM  Final   Special Requests NONE  Final   Sputum evaluation   Final    RARE SQUAMOUS EPITHELIAL CELLS PRESENT FEW WBC PRESENT,BOTH PMN AND MONONUCLEAR FEW GRAM NEGATIVE RODS THIS SPECIMEN IS ACCEPTABLE FOR SPUTUM CULTURE Performed at [facility], [address] [place], [place] [address]    Report Status PENDING  Incomplete  Culture, Respiratory w Gram Stain     Status: None (Preliminary result)   Collection Time: 10/28/20  2:57 PM  Result Value Ref Range Status   Specimen Description EXPECTORATED SPUTUM  Final   Special Requests NONE Reflexed from [id number]  Final   Gram Stain   Final    FEW SQUAMOUS EPITHELIAL CELLS PRESENT MODERATE WBC PRESENT,BOTH PMN AND  MONONUCLEAR ABUNDANT GRAM NEGATIVE RODS    Culture   Final    TOO YOUNG TO READ Performed at [facility], [address], [place], [place] [address]    Report Status PENDING  Incomplete       Radiology Studies: CT ABDOMEN PELVIS W CONTRAST  Result Date: 10/30/2020 CLINICAL DATA:  Abdominal pain. EXAM: CT ABDOMEN AND PELVIS WITH CONTRAST TECHNIQUE: Multidetector CT imaging of the abdomen and pelvis was performed using the standard protocol following bolus administration of intravenous contrast. CONTRAST:  OMNIPAQUE IOHEXOL 300 MG/ML  SOLN COMPARISON:  2020/11/17 FINDINGS: Lower chest: Moderate to marked severity areas of consolidation are seen within the visualized portions of the bilateral lower lobes. This is increased in severity when compared to the prior study. There is a small right pleural effusion. A moderate to large left pleural effusion is also seen. These are increased in size when compared to the prior exam. Hepatobiliary: No focal liver abnormality is seen. No  gallstones, gallbladder wall thickening, or biliary dilatation. Pancreas: The pancreas remains poorly visualized. Spleen: Normal in size without focal abnormality. Adrenals/Urinary Tract: Adrenal glands are unremarkable. Kidneys are normal, without renal calculi, focal lesion, or hydronephrosis. Bladder is unremarkable. Stomach/Bowel: A nasogastric tube is in place. Stomach is within normal limits. Appendix appears normal. No evidence of bowel dilatation. Radiopaque contrast is seen throughout the large and small bowel without evidence of contrast extravasation. Two surgical drains are seen entering via the anterolateral aspect of the mid right abdominal wall. The distal tip of the first catheter sits just above the gallbladder fossa, while the distal tip of the second catheter is seen within the region posterior to the gastric fundus. A large amount of gas and fluid is again seen dissecting through the retroperitoneum. This extends from the posteromedial aspect of the left upper quadrant throughout the mid and left abdomen into the posterior aspect of the pelvis and is decreased in severity when compared to the prior study. Vascular/Lymphatic: No significant vascular findings are present. No enlarged abdominal or pelvic lymph nodes. Reproductive: Prostate is unremarkable. Other: Two surgical drains are seen entering via the anterolateral aspect of the mid right abdominal wall. This represents a new finding. The distal tip of the first catheter sits just above the gallbladder fossa, while the distal tip of the second catheter is seen within the region posterior to the gastric fundus. A large amount of gas and fluid is again seen dissecting through the retroperitoneum. This extends from the posteromedial aspect of the left upper quadrant throughout the mid and left abdomen into the posterior aspect of the pelvis and is decreased in severity when compared to the prior study. Moderate severity diffuse mesenteric  inflammatory fat stranding is noted, most prominent within the mid and upper abdomen. A mild-to-moderate amount of ascites is seen. Musculoskeletal: No acute or significant osseous findings. IMPRESSION: 1. Interval abdominal surgical drain placement and positioning, as described above, with a large amount of residual gas and fluid dissecting through the retroperitoneum. 2. Moderate to marked severity bilateral lower lobe consolidation with bilateral pleural effusions, left greater than right. 3. Mild to moderate amount of ascites. Electronically Signed   By: Aram Candela M.D.   On: 10/30/2020 00:25   DG Abd Portable 1V  Result Date: 10/29/2020 CLINICAL DATA:  Feeding tube placement. EXAM: PORTABLE ABDOMEN - 1 VIEW COMPARISON:  One-view abdomen 10/26/2020 FINDINGS: Tip of a small bore feeding tube is in the distal stomach. Are gas  pattern is unremarkable. Heart is enlarged. Left lower lobe airspace disease is again seen. IMPRESSION: Tip of small bore feeding tube in the distal stomach. Electronically Signed   By: Marin Roberts M.D.   On: 10/29/2020 14:21   ECHOCARDIOGRAM COMPLETE  Result Date: 10/28/2020    ECHOCARDIOGRAM REPORT   Patient Name:   KARANVEER RAMAKRISHNAN Date of Exam: 10/28/2020 Medical Rec #:  [id number]    Height:       69.0 in Accession #:    [id number]   Weight:       225.3 lb Date of Birth:  September 19, 1965   BSA:          2.173 m Patient Age:    54 years     BP:           132/84 mmHg Patient Gender: M            HR:           103 bpm. Exam Location:  Inpatient Procedure: 2D Echo Indications:    acute systolic chf  History:        Patient has no prior history of Echocardiogram examinations.                 Sepsis, Signs/Symptoms:Altered Mental Status; Risk                 Factors:Current Smoker.  Sonographer:    Delcie Roch RDCS Referring Phys: [id number] Gwendola Hornaday  Sonographer Comments: Image acquisition challenging due to uncooperative patient. IMPRESSIONS  1. Left ventricular ejection  fraction, by estimation, is 65 to 70%. The left ventricle has normal function. The left ventricle has no regional wall motion abnormalities. The left ventricular internal cavity size was mildly dilated. Left ventricular diastolic parameters were normal.  2. Right ventricular systolic function is normal. The right ventricular size is normal.  3. The mitral valve is normal in structure. Trivial mitral valve regurgitation.  4. The aortic valve is normal in structure. Aortic valve regurgitation is not visualized. FINDINGS  Left Ventricle: Left ventricular ejection fraction, by estimation, is 65 to 70%. The left ventricle has normal function. The left ventricle has no regional wall motion abnormalities. The left ventricular internal cavity size was mildly dilated. There is  no left ventricular hypertrophy. Left ventricular diastolic parameters were normal. Right Ventricle: The right ventricular size is normal. Right vetricular wall thickness was not assessed. Right ventricular systolic function is normal. Left Atrium: Left atrial size was normal in size. Right Atrium: Right atrial size was normal in size. Pericardium: There is no evidence of pericardial effusion. Mitral Valve: The mitral valve is normal in structure. Trivial mitral valve regurgitation. Tricuspid Valve: The tricuspid valve is normal in structure. Tricuspid valve regurgitation is trivial. Aortic Valve: The aortic valve is normal in structure. Aortic valve regurgitation is not visualized. Pulmonic Valve: The pulmonic valve was normal in structure. Pulmonic valve regurgitation is not visualized. Aorta: The aortic root and ascending aorta are structurally normal, with no evidence of dilitation. Venous: The inferior vena cava was not well visualized. IAS/Shunts: No atrial level shunt detected by color flow Doppler.  LEFT VENTRICLE PLAX 2D LVIDd:         5.40 cm  Diastology LVIDs:         3.50 cm  LV e' medial:    8.05 cm/s LV PW:         0.90 cm  LV E/e'  medial:  10.2 LV IVS:  0.70 cm  LV e' lateral:   10.30 cm/s LVOT diam:     1.90 cm  LV E/e' lateral: 8.0 LV SV:         55 LV SV Index:   25 LVOT Area:     2.84 cm  RIGHT VENTRICLE RV S prime:     20.10 cm/s TAPSE (M-mode): 1.8 cm LEFT ATRIUM           Index       RIGHT ATRIUM           Index LA diam:      3.00 cm 1.38 cm/m  RA Area:     13.20 cm LA Vol (A2C): 34.7 ml 15.97 ml/m RA Volume:   29.40 ml  13.53 ml/m LA Vol (A4C): 33.1 ml 15.23 ml/m  AORTIC VALVE LVOT Vmax:   133.00 cm/s LVOT Vmean:  88.100 cm/s LVOT VTI:    0.195 m  AORTA Ao Root diam: 3.30 cm Ao Asc diam:  3.20 cm MV E velocity: 82.00 cm/s MV A velocity: 110.00 cm/s  SHUNTS MV E/A ratio:  0.75         Systemic VTI:  0.20 m                             Systemic Diam: 1.90 cm Dietrich Pates MD Electronically signed by Dietrich Pates MD Signature Date/Time: 10/28/2020/5:29:43 PM    Final     Scheduled Meds:  chlorhexidine  15 mL Mouth Rinse BID   Chlorhexidine Gluconate Cloth  6 each Topical Q0600   fluticasone furoate-vilanterol  1 puff Inhalation Daily   And   umeclidinium bromide  1 puff Inhalation Daily   furosemide  40 mg Intravenous Daily   heparin  5,000 Units Subcutaneous Q8H   insulin aspart  0-20 Units Subcutaneous Q4H   insulin aspart  3 Units Subcutaneous Q4H   mouth rinse  15 mL Mouth Rinse q12n4p   pantoprazole (PROTONIX) IV  40 mg Intravenous Q24H   sodium chloride flush  10-40 mL Intracatheter Q12H   sodium chloride flush  10-40 mL Intracatheter Q12H   Continuous Infusions:  sodium chloride Stopped (10/27/20 1129)   meropenem (MERREM) IV 1 g (10/30/20 1330)   TPN ADULT (ION) 100 mL/hr at 10/30/20 1200   TPN ADULT (ION)       LOS: 9 days   Time spent: 29 minutes   Hughie Closs, MD [facility]  10/30/2020, 2:43 PM  Please page via Amion and do not message via secure chat for anything urgent. Secure chat can be used for anything non urgent and I will respond at my earliest availability.  How to  contact the [facility] Attending or Consulting provider 7A - 7P or covering provider during after hours 7P -7A, for this patient?  Check the care team in [facility] and look for a) attending/consulting TRH provider listed and b) the [facility] team listed. Page or secure chat 7A-7P. Log into [email or web address] and use [place]'s universal password to access. If you do not have the password, please contact the hospital operator. Locate the [facility] provider you are looking for under [facility] and page to a number that you can be directly reached. If you still have difficulty reaching the provider, please page the [facility] (Director on Call) for the Hospitalists listed on amion for assistance.

## 2020-10-31 ENCOUNTER — Inpatient Hospital Stay (HOSPITAL_COMMUNITY): Payer: BC Managed Care – PPO

## 2020-10-31 DIAGNOSIS — A419 Sepsis, unspecified organism: Secondary | ICD-10-CM | POA: Diagnosis not present

## 2020-10-31 DIAGNOSIS — R6520 Severe sepsis without septic shock: Secondary | ICD-10-CM | POA: Diagnosis not present

## 2020-10-31 LAB — COMPREHENSIVE METABOLIC PANEL
ALT: 25 U/L (ref 0–44)
AST: 31 U/L (ref 15–41)
Albumin: 1.5 g/dL — ABNORMAL LOW (ref 3.5–5.0)
Alkaline Phosphatase: 94 U/L (ref 38–126)
Anion gap: 7 (ref 5–15)
BUN: 16 mg/dL (ref 6–20)
CO2: 30 mmol/L (ref 22–32)
Calcium: 8.6 mg/dL — ABNORMAL LOW (ref 8.9–10.3)
Chloride: 107 mmol/L (ref 98–111)
Creatinine, Ser: 0.61 mg/dL (ref 0.61–1.24)
GFR, Estimated: >60 mL/min (ref >60)
Glucose, Bld: 129 mg/dL — ABNORMAL HIGH (ref 70–99)
Potassium: 4.2 mmol/L (ref 3.5–5.1)
Sodium: 144 mmol/L (ref 135–145)
Total Bilirubin: 0.8 mg/dL (ref 0.3–1.2)
Total Protein: 5.6 g/dL — ABNORMAL LOW (ref 6.5–8.1)

## 2020-10-31 LAB — CBC WITH DIFFERENTIAL/PLATELET
Abs Immature Granulocytes: 0 10*3/uL (ref 0.00–0.07)
Band Neutrophils: 2 %
Basophils Absolute: 0 10*3/uL (ref 0.0–0.1)
Basophils Relative: 0 %
Eosinophils Absolute: 0.1 10*3/uL (ref 0.0–0.5)
Eosinophils Relative: 1 %
HCT: 27 % — ABNORMAL LOW (ref 39.0–52.0)
Hemoglobin: 8.4 g/dL — ABNORMAL LOW (ref 13.0–17.0)
Lymphocytes Relative: 13 %
Lymphs Abs: 1.4 10*3/uL (ref 0.7–4.0)
MCH: 33.5 pg (ref 26.0–34.0)
MCHC: 31.1 g/dL (ref 30.0–36.0)
MCV: 107.6 fL — ABNORMAL HIGH (ref 80.0–100.0)
Monocytes Absolute: 1.6 10*3/uL — ABNORMAL HIGH (ref 0.1–1.0)
Monocytes Relative: 14 %
Neutro Abs: 8 10*3/uL — ABNORMAL HIGH (ref 1.7–7.7)
Neutrophils Relative %: 70 %
Platelets: 112 10*3/uL — ABNORMAL LOW (ref 150–400)
RBC: 2.51 MIL/uL — ABNORMAL LOW (ref 4.22–5.81)
RDW: 18.5 % — ABNORMAL HIGH (ref 11.5–15.5)
Smear Review: DECREASED
WBC: 11.1 10*3/uL — ABNORMAL HIGH (ref 4.0–10.5)
nRBC: 0.5 % — ABNORMAL HIGH (ref 0.0–0.2)

## 2020-10-31 LAB — PROCALCITONIN: Procalcitonin: 1.12 ng/mL

## 2020-10-31 LAB — GLUCOSE, CAPILLARY
Glucose-Capillary: 133 mg/dL — ABNORMAL HIGH (ref 70–99)
Glucose-Capillary: 144 mg/dL — ABNORMAL HIGH (ref 70–99)
Glucose-Capillary: 146 mg/dL — ABNORMAL HIGH (ref 70–99)
Glucose-Capillary: 147 mg/dL — ABNORMAL HIGH (ref 70–99)
Glucose-Capillary: 145 mg/dL — ABNORMAL HIGH (ref 70–99)
Glucose-Capillary: 163 mg/dL — ABNORMAL HIGH (ref 70–99)

## 2020-10-31 LAB — BRAIN NATRIURETIC PEPTIDE: B Natriuretic Peptide: 160 pg/mL — ABNORMAL HIGH (ref 0.0–100.0)

## 2020-10-31 MED ORDER — TRAVASOL 10 % IV SOLN
INTRAVENOUS | Status: AC
  Filled 2020-10-31: qty 1320

## 2020-10-31 NOTE — Progress Notes (Signed)
PROGRESS NOTE    Paul Chambers  VCB:[id number] DOB: [birth year] DOA: 11/03/2020 PCP: Pcp, No   Brief Narrative:  This 55 y.o. Caucasian male smoker presented to the [facility] Emergency Department via private vehicle with complaints of abdominal pain and altered mental status. The patient's wife reports that this is the third presentation to a healthcare facility in the past month for abdominal pain ([facility], [facility] and now [facility]).  The patient's deteriorating mental status over the past 2 days prompted the wife to force the patient to come to the hospital.  In the ER, CT head was negative for acute process, but CT abdomen showed an impressive pneumoperitoneum without an obvious location of perforation.  Surgery evaluated the patient and decided for exploratory laparotomy which was performed on 10/22/2020.  Sequence of events as below. Significant Hospital Events: Including procedures, antibiotic start and stop dates in addition to other pertinent events   9/8 - Presented to [facility] with abdominal pain. CT A/P with extensive pneumoperitoneum 9/9 - Ex-lap (no colonic perforation discovered, large necrotic fluid present surrounding pancreas. Transferred to ICU post-op, intubated and sedated.  9/10-received 1 unit PRBC for hemoglobin 6.7, went back on Levophed for short period of time 9/12 - Extubated to Venturi mask 9/13 - Off Fentanyl and Propofol gtt 10/28/20.  Patient transferred under TRH.  Assessment & Plan:   Principal Problem:   Severe sepsis (HCC) Active Problems:   Acute necrotizing pancreatitis   Toxic encephalopathy   Hyponatremia   Hypoalbuminemia   Macrocytic anemia   Pressure injury of skin   Protein-calorie malnutrition, severe   Infected Necrotizing Pancreatitis with Pneumoperitoneum s/p ex-lap by Dr. Drexel Iha on 10/22/2020: - General surgery following; appreciate their recommendations.  - Continue IV antibiotics with Zosyn and Fluconazole, 7  day planned - Discontinued Linezolid, as he has completed course.  - Continue TPN.  He pulled out his NG tube on 10/27/2020. Continues to have fever, had 101 fever around 8 PM last night.  CT abdomen repeated 10/29/2020 by general surgery shows large amount of residual gas and fluid dissecting through the retroperitoneum, no evidence of enteric fistula or leak.  Still has intermittent tachycardia and tachypnea but no fever. Patient remains on antibiotics and supportive therapy, management per general surgery.   Acute respiratory failure with hypoxia/possible aspiration pneumonia/congestive heart failure/left pleural effusion: Intubated 10/22/2020 and extubated 10/26/2020.  Currently on 3 L oxygen.  Repeat chest x-ray 10/28/2020 shows worsening/increased left lung opacity concerning for pneumonia or atelectasis and associated left pleural effusion.  Antibiotics transition to Merrem.  Repeat procalcitonin improving.  Patient's respiratory status and lung exam is also improving while he is on Lasix.  We will recheck BNP today as well as chest x-ray.  Continue current Lasix.   Acute toxic-metabolic encephalopathy: Sepsis +/- ICU delirium: He is more alert and oriented today.  Maintain delirium precautions.  Ileus: Resolving.  Managed by general surgery.   Acute hepatitis: Stable. 2/2 to TPN - CMP daily    Alcohol abuse: Out of the window for withdrawal complications.   - Continue thiamine and folate   Acute Blood Loss Anemia, postsurgical: S/p 1 unit of pRBCs on 9/10 with stabilization in hemoglobin since.  - Monitor H&H and transfuse if less than 7   Severe protein calorie malnutrition - Continue TPN   Diabetes type 2 with hyperglycemia: Hemoglobin A1c was 6.8% on admission.  Blood sugar controlled.  He is not on any Lantus.  Continue SSI.   Hypokalemia: Resolved.  Hypophosphatemia: Replenished.   DVT prophylaxis: heparin injection 5,000 Units Start: 10/22/20 1400 Place and maintain sequential  compression device Start: 10/29/2020 2202 SCDs Start: 10/22/2020 2145   Code Status: Full Code  Family Communication: None present at bedside.   Status is: Inpatient  Remains inpatient appropriate because:Inpatient level of care appropriate due to severity of illness  Dispo: The patient is from: Home              Anticipated d/c is to: SNF              Patient currently is not medically stable to d/c.   Difficult to place patient No        Estimated body mass index is 31.97 kg/m as calculated from the following:   Height as of this encounter: 5\' 9"  (1.753 m).   Weight as of this encounter: 98.2 kg.     Nutritional Assessment: Body mass index is 31.97 kg/m. Seen by dietician.  I agree with the assessment and plan as outlined below: Nutrition Status: Nutrition Problem: Severe Malnutrition Etiology: acute illness (infected necrotic pancreatitis) Signs/Symptoms: moderate fat depletion, moderate muscle depletion, energy intake < or equal to 50% for > or equal to 5 days, percent weight loss (8.4% weight loss in 1.5 months) Percent weight loss: 8.4 % (1.5 months) Interventions: Refer to RD note for recommendations  .  Skin Assessment: I have examined the patient's skin and I agree with the wound assessment as performed by the wound care RN as outlined below:    Consultants:  General surgery  Procedures:  As above  Antimicrobials:  Anti-infectives (From admission, onward)    Start     Dose/Rate Route Frequency Ordered Stop   10/29/20 2200  meropenem (MERREM) 1 g in sodium chloride 0.9 % 100 mL IVPB        1 g 200 mL/hr over 30 Minutes Intravenous Every 8 hours 10/29/20 1428     10/28/20 1615  meropenem (MERREM) 2 g in sodium chloride 0.9 % 100 mL IVPB  Status:  Discontinued        2 g 200 mL/hr over 30 Minutes Intravenous Every 8 hours 10/28/20 1515 10/29/20 1428   10/28/20 1445  metroNIDAZOLE (FLAGYL) IVPB 500 mg  Status:  Discontinued        500 mg 100 mL/hr over  60 Minutes Intravenous Every 12 hours 10/28/20 1348 10/28/20 1505   10/25/20 2300  fluconazole (DIFLUCAN) IVPB 400 mg        400 mg 100 mL/hr over 120 Minutes Intravenous Every 24 hours 10/25/20 0742 10/29/20 0718   10/24/20 2300  fluconazole (DIFLUCAN) IVPB 200 mg  Status:  Discontinued        200 mg 100 mL/hr over 60 Minutes Intravenous Every 24 hours 10/24/20 0943 10/25/20 0742   10/22/20 1800  vancomycin (VANCOREADY) IVPB 1500 mg/300 mL  Status:  Discontinued        1,500 mg 150 mL/hr over 120 Minutes Intravenous Every 24 hours 10/24/2020 2225 10/22/20 0759   10/22/20 0900  linezolid (ZYVOX) IVPB 600 mg  Status:  Discontinued        600 mg 300 mL/hr over 60 Minutes Intravenous Every 12 hours 10/22/20 0800 10/27/20 0852   10/22/20 0300  piperacillin-tazobactam (ZOSYN) IVPB 3.375 g  Status:  Discontinued        3.375 g 12.5 mL/hr over 240 Minutes Intravenous Every 8 hours 10/28/2020 2225 10/28/20 1514   10/24/2020 2335  metroNIDAZOLE (FLAGYL) IVPB 500 mg  Status:  Discontinued        500 mg 100 mL/hr over 60 Minutes Intravenous Every 12 hours 10/24/2020 2142 10/22/20 0747   10-24-2020 2248  fluconazole (DIFLUCAN) IVPB 400 mg  Status:  Discontinued        400 mg 100 mL/hr over 120 Minutes Intravenous Every 24 hours Oct 24, 2020 2142 10/24/20 0943   24-Oct-2020 1748  vancomycin (VANCOREADY) IVPB 2000 mg/400 mL        2,000 mg 200 mL/hr over 120 Minutes Intravenous  Once 24-Oct-2020 1706 2020-10-24 2113   2020/10/24 1715  piperacillin-tazobactam (ZOSYN) IVPB 3.375 g        3.375 g 100 mL/hr over 30 Minutes Intravenous  Once 24-Oct-2020 1706 10-24-2020 1903          Subjective: Patient seen and examined.  He is more awake alert and oriented compared to yesterday.  Complains of abdominal pain today.  Denied any shortness of breath.  Objective: Vitals:   10/31/20 0700 10/31/20 0800 10/31/20 0900 10/31/20 1000  BP: (!) 143/82 140/83 (!) 148/75 (!) 143/85  Pulse: 97 94 (!) 101 (!) 103  Resp: 20 (!) 21 (!) 25  (!) 21  Temp: 98.9 F (37.2 C)     TempSrc: Oral     SpO2: 96% 96% 98% 96%  Weight:      Height:        Intake/Output Summary (Last 24 hours) at 10/31/2020 1123 Last data filed at 10/31/2020 1100 Gross per 24 hour  Intake 2632.22 ml  Output 2256 ml  Net 376.22 ml    Filed Weights   10/30/20 0332 10/30/20 1111 10/31/20 0350  Weight: 96.8 kg 98.9 kg 98.2 kg    Examination:  General exam: Appears calm and comfortable  Respiratory system: Coarse breath sounds with rhonchi and crackles bilaterally. Respiratory effort normal. Cardiovascular system: S1 & S2 heard, RRR. No JVD, murmurs, rubs, gallops or clicks. No pedal edema. Gastrointestinal system: Abdomen is nondistended, soft and nontender. No organomegaly or masses felt. Normal bowel sounds heard. Central nervous system: Alert and oriented x3. No focal neurological deficits. Extremities: Symmetric 5 x 5 power. Skin: No rashes, lesions or ulcers.  Psychiatry: Judgement and insight appear poor   Data Reviewed: I have personally reviewed following labs and imaging studies  CBC: Recent Labs  Lab 10/27/20 0500 10/28/20 0212 10/29/20 1008 10/30/20 0328 10/31/20 0352  WBC 16.5* 16.7* 12.4* 9.8 11.1*  NEUTROABS 15.7* 15.2* 10.3* 7.3 8.0*  HGB 8.2* 8.6* 8.6* 8.3* 8.4*  HCT 25.5* 27.3* 26.8* 26.9* 27.0*  MCV 104.1* 106.2* 105.9* 119.6* 107.6*  PLT 158 140* 114* 104* 112*    Basic Metabolic Panel: Recent Labs  Lab 10/25/20 0328 10/26/20 0401 10/26/20 1811 10/27/20 0500 10/28/20 0212 10/29/20 0630 10/30/20 0503 10/31/20 0352  NA 132* 135   < > 138 143 143 143 144  K 4.5 3.5   < > 3.3* 3.5 3.7 3.9 4.2  CL 97* 101   < > 104 108 108 107 107  CO2 25 25   < > GLUCOSE 199* 123*   < > 252* 154* 177* 174* 129*  BUN 51* 37*   < > 22* CREATININE 1.76* 1.12   < > 0.87 0.69 0.57* 0.52* 0.61  CALCIUM 8.0* 8.1*   < > 8.3* 8.7* 8.7* 8.6* 8.6*  MG 2.4 2.2  --  2.1 2.0  --   --   --   PHOS 4.0 2.3*   --  2.6 2.1*  --  2.6  --    < > = values in this interval not displayed.    GFR: Estimated Creatinine Clearance: 124.8 mL/min (by C-G formula based on SCr of 0.61 mg/dL). Liver Function Tests: Recent Labs  Lab 10/27/20 0500 10/28/20 0212 10/29/20 0630 10/30/20 0503 10/31/20 0352  AST 44* 35 28 28 31   ALT 35 30 28 27 25   ALKPHOS 130* 141* 147* 119 94  BILITOT 1.0 0.9 0.6 0.8 0.8  PROT 5.3* 5.3* 5.6* 5.4* 5.6*  ALBUMIN 1.5* 1.5* 1.5* 1.5* 1.5*    No results for input(s): LIPASE, AMYLASE in the last 168 hours.  No results for input(s): AMMONIA in the last 168 hours.  Coagulation Profile: No results for input(s): INR, PROTIME in the last 168 hours.  Cardiac Enzymes: No results for input(s): CKTOTAL, CKMB, CKMBINDEX, TROPONINI in the last 168 hours.  BNP (last 3 results) No results for input(s): PROBNP in the last 8760 hours. HbA1C: No results for input(s): HGBA1C in the last 72 hours. CBG: Recent Labs  Lab 10/30/20 1923 10/30/20 2327 10/31/20 0331 10/31/20 0717 10/31/20 1102  GLUCAP 156* 154* 133* 144* 146*    Lipid Profile: No results for input(s): CHOL, HDL, LDLCALC, TRIG, CHOLHDL, LDLDIRECT in the last 72 hours. Thyroid Function Tests: No results for input(s): TSH, T4TOTAL, FREET4, T3FREE, THYROIDAB in the last 72 hours. Anemia Panel: No results for input(s): VITAMINB12, FOLATE, FERRITIN, TIBC, IRON, RETICCTPCT in the last 72 hours. Sepsis Labs: Recent Labs  Lab 10/28/20 1347 10/31/20 0937  PROCALCITON 2.38 1.12     Recent Results (from the past 240 hour(s))  Resp Panel by RT-PCR (Flu A&B, Covid) Nasopharyngeal Swab     Status: None   Collection Time: 10-23-20  8:49 PM   Specimen: Nasopharyngeal Swab; Nasopharyngeal(NP) swabs in vial transport medium  Result Value Ref Range Status   SARS Coronavirus 2 by RT PCR NEGATIVE NEGATIVE Final    Comment: (NOTE) SARS-CoV-2 target nucleic acids are NOT DETECTED.  The SARS-CoV-2 RNA is generally detectable  in upper respiratory specimens during the acute phase of infection. The lowest concentration of SARS-CoV-2 viral copies this assay can detect is 138 copies/mL. A negative result does not preclude SARS-Cov-2 infection and should not be used as the sole basis for treatment or other patient management decisions. A negative result may occur with  improper specimen collection/handling, submission of specimen other than nasopharyngeal swab, presence of viral mutation(s) within the areas targeted by this assay, and inadequate number of viral copies(<138 copies/mL). A negative result must be combined with clinical observations, patient history, and epidemiological information. The expected result is Negative.  Fact Sheet for Patients:  11/02/20  Fact Sheet for Healthcare Providers:  12/21/20  This test is no t yet approved or cleared by the [employer] FDA and  has been authorized for detection and/or diagnosis of SARS-CoV-2 by FDA under an Emergency Use Authorization (EUA). This EUA will remain  in effect (meaning this test can be used) for the duration of the COVID-19 declaration under Section 564(b)(1) of the Act, 21 U.S.C.section 360bbb-3(b)(1), unless the authorization is terminated  or revoked sooner.       Influenza A by PCR NEGATIVE NEGATIVE Final   Influenza B by PCR NEGATIVE NEGATIVE Final    Comment: (NOTE) The Xpert Xpress SARS-CoV-2/FLU/RSV plus assay is intended as an aid in the diagnosis of influenza from Nasopharyngeal swab specimens and should not be used as a sole basis for treatment. Nasal washings  and aspirates are unacceptable for Xpert Xpress SARS-CoV-2/FLU/RSV testing.  Fact Sheet for Patients: [employer]  Fact Sheet for Healthcare Providers: SeriousBroker.it  This test is not yet approved or cleared by the [place] FDA and has  been authorized for detection and/or diagnosis of SARS-CoV-2 by FDA under an Emergency Use Authorization (EUA). This EUA will remain in effect (meaning this test can be used) for the duration of the COVID-19 declaration under Section 564(b)(1) of the Act, 21 U.S.C. section 360bbb-3(b)(1), unless the authorization is terminated or revoked.  Performed at [facility], [address] [place], [place] [address]   Culture, blood (routine x 2)     Status: None   Collection Time: 10/24/2020 10:17 PM   Specimen: BLOOD  Result Value Ref Range Status   Specimen Description BLOOD LEFT ANTECUBITAL  Final   Special Requests   Final    BOTTLES DRAWN AEROBIC AND ANAEROBIC Blood Culture adequate volume   Culture   Final    NO GROWTH 5 DAYS Performed at [facility], [address] [place], [place] [address]    Report Status 10/26/2020 FINAL  Final  MRSA Next Gen by PCR, Nasal     Status: None   Collection Time: 10/22/20  2:00 AM  Result Value Ref Range Status   MRSA by PCR Next Gen NOT DETECTED NOT DETECTED Final    Comment: (NOTE) The GeneXpert MRSA Assay (FDA approved for NASAL specimens only), is one component of a comprehensive MRSA colonization surveillance program. It is not intended to diagnose MRSA infection nor to guide or monitor treatment for MRSA infections. Test performance is not FDA approved in patients less than 39 years old. Performed at [facility], [address] [place], [place] [address]   Culture, blood (routine x 2)     Status: None   Collection Time: 10/22/20  3:52 AM   Specimen: BLOOD  Result Value Ref Range Status   Specimen Description BLOOD LEFT ANTECUBITAL  Final   Special Requests   Final    BOTTLES DRAWN AEROBIC AND ANAEROBIC Blood Culture adequate volume   Culture   Final    NO GROWTH 5 DAYS Performed at [facility], [address] [place], [place] [address]    Report Status 10/27/2020 FINAL  Final  Expectorated Sputum  Assessment w Gram Stain, Rflx to Resp Cult     Status: None   Collection Time: 10/28/20  2:57 PM   Specimen: Expectorated Sputum  Result Value Ref Range Status   Specimen Description EXPECTORATED SPUTUM  Final   Special Requests NONE  Final   Sputum evaluation   Final    THIS SPECIMEN IS ACCEPTABLE FOR SPUTUM CULTURE Performed at [facility], [address] [place], [place] [address]    Report Status 10/30/2020 FINAL  Final  Culture, Respiratory w Gram Stain     Status: None (Preliminary result)   Collection Time: 10/28/20  2:57 PM  Result Value Ref Range Status   Specimen Description EXPECTORATED SPUTUM  Final   Special Requests NONE Reflexed from [id number]  Final   Gram Stain   Final    FEW SQUAMOUS EPITHELIAL CELLS PRESENT MODERATE WBC PRESENT,BOTH PMN AND MONONUCLEAR ABUNDANT GRAM NEGATIVE RODS    Culture   Final    MODERATE STENOTROPHOMONAS MALTOPHILIA SUSCEPTIBILITIES TO FOLLOW Performed at [facility], [address] [place], [place] [address]    Report Status PENDING  Incomplete  Radiology Studies: CT ABDOMEN PELVIS W CONTRAST  Result Date: 10/30/2020 CLINICAL DATA:  Abdominal pain. EXAM: CT ABDOMEN AND PELVIS WITH CONTRAST TECHNIQUE: Multidetector CT imaging of the abdomen and pelvis was performed using the standard protocol following bolus administration of intravenous contrast. CONTRAST:  OMNIPAQUE IOHEXOL 300 MG/ML  SOLN COMPARISON:  October 21, 2020 FINDINGS: Lower chest: Moderate to marked severity areas of consolidation are seen within the visualized portions of the bilateral lower lobes. This is increased in severity when compared to the prior study. There is a small right pleural effusion. A moderate to large left pleural effusion is also seen. These are increased in size when compared to the prior exam. Hepatobiliary: No focal liver abnormality is seen. No gallstones, gallbladder wall thickening, or biliary dilatation. Pancreas: The  pancreas remains poorly visualized. Spleen: Normal in size without focal abnormality. Adrenals/Urinary Tract: Adrenal glands are unremarkable. Kidneys are normal, without renal calculi, focal lesion, or hydronephrosis. Bladder is unremarkable. Stomach/Bowel: A nasogastric tube is in place. Stomach is within normal limits. Appendix appears normal. No evidence of bowel dilatation. Radiopaque contrast is seen throughout the large and small bowel without evidence of contrast extravasation. Two surgical drains are seen entering via the anterolateral aspect of the mid right abdominal wall. The distal tip of the first catheter sits just above the gallbladder fossa, while the distal tip of the second catheter is seen within the region posterior to the gastric fundus. A large amount of gas and fluid is again seen dissecting through the retroperitoneum. This extends from the posteromedial aspect of the left upper quadrant throughout the mid and left abdomen into the posterior aspect of the pelvis and is decreased in severity when compared to the prior study. Vascular/Lymphatic: No significant vascular findings are present. No enlarged abdominal or pelvic lymph nodes. Reproductive: Prostate is unremarkable. Other: Two surgical drains are seen entering via the anterolateral aspect of the mid right abdominal wall. This represents a new finding. The distal tip of the first catheter sits just above the gallbladder fossa, while the distal tip of the second catheter is seen within the region posterior to the gastric fundus. A large amount of gas and fluid is again seen dissecting through the retroperitoneum. This extends from the posteromedial aspect of the left upper quadrant throughout the mid and left abdomen into the posterior aspect of the pelvis and is decreased in severity when compared to the prior study. Moderate severity diffuse mesenteric inflammatory fat stranding is noted, most prominent within the mid and upper  abdomen. A mild-to-moderate amount of ascites is seen. Musculoskeletal: No acute or significant osseous findings. IMPRESSION: 1. Interval abdominal surgical drain placement and positioning, as described above, with a large amount of residual gas and fluid dissecting through the retroperitoneum. 2. Moderate to marked severity bilateral lower lobe consolidation with bilateral pleural effusions, left greater than right. 3. Mild to moderate amount of ascites. Electronically Signed   By: Aram Candela M.D.   On: 10/30/2020 00:25   DG Abd Portable 1V  Result Date: 10/29/2020 CLINICAL DATA:  Feeding tube placement. EXAM: PORTABLE ABDOMEN - 1 VIEW COMPARISON:  One-view abdomen 10/26/2020 FINDINGS: Tip of a small bore feeding tube is in the distal stomach. Are gas pattern is unremarkable. Heart is enlarged. Left lower lobe airspace disease is again seen. IMPRESSION: Tip of small bore feeding tube in the distal stomach. Electronically Signed   By: Marin Roberts M.D.   On: 10/29/2020 14:21    Scheduled Meds:  chlorhexidine  15 mL Mouth Rinse BID   Chlorhexidine Gluconate Cloth  6 each Topical Q0600   fluticasone furoate-vilanterol  1 puff Inhalation Daily   And   umeclidinium bromide  1 puff Inhalation Daily   furosemide  40 mg Intravenous Daily   heparin  5,000 Units Subcutaneous Q8H   insulin aspart  0-20 Units Subcutaneous Q4H   mouth rinse  15 mL Mouth Rinse q12n4p   pantoprazole (PROTONIX) IV  40 mg Intravenous Q24H   sodium chloride flush  10-40 mL Intracatheter Q12H   sodium chloride flush  10-40 mL Intracatheter Q12H   Continuous Infusions:  sodium chloride Stopped (10/31/20 0131)   meropenem (MERREM) IV Stopped (10/31/20 0603)   TPN ADULT (ION) 100 mL/hr at 10/31/20 1100   TPN ADULT (ION)       LOS: 10 days   Time spent: 28 minutes   Hughie Closs, MD [facility]  10/31/2020, 11:23 AM  Please page via Loretha Stapler and do not message via secure chat for anything urgent.  Secure chat can be used for anything non urgent and I will respond at my earliest availability.  How to contact the [facility] Attending or Consulting provider 7A - 7P or covering provider during after hours 7P -7A, for this patient?  Check the care team in [facility] and look for a) attending/consulting TRH provider listed and b) the [facility] team listed. Page or secure chat 7A-7P. Log into [email or web address] and use [place]'s universal password to access. If you do not have the password, please contact the hospital operator. Locate the [facility] provider you are looking for under [facility] and page to a number that you can be directly reached. If you still have difficulty reaching the provider, please page the [facility] (Director on Call) for the Hospitalists listed on amion for assistance.

## 2020-10-31 NOTE — Progress Notes (Signed)
PHARMACY - TOTAL PARENTERAL NUTRITION CONSULT NOTE   Indication:  severe pancreatitis  Patient Measurements: Height: 5\' 9"  (175.3 cm) Weight: 98.2 kg (216 lb 7.9 oz) IBW/kg (Calculated) : 70.7 TPN AdjBW (KG): 78 Body mass index is 31.97 kg/m.  Assessment: 64 yom presenting 9/8 with septic shock due to necrotizing pancreatitis with pneumoperitoneum s/p ex-lap 9/9. Pt transferred to ICU post-op, intubated and sedated. Pharmacy consulted to start TPN for severe pancreatitis. Pt at risk of refeeding with minimal PO intake x 1 week PTA, decreased PO intake x 1.5 months PTA with significant weight loss and hx heavy alcohol abuse.  Glucose / Insulin: A1c 6.8 (no meds pta). CBGs improved to 130-150s with 70 units insulin R in TPN, 15 units SSI since new TPN with inc insulin  Electrolytes: Na 144, K 4.2, Phos 2.6, Cl/HCO3 wnl, CoCa 10.6 Renal: AKI resolved - SCr 0.61, BUN wnl Hepatic: LFTs / Tbili normalized, TG WNL, albumin 1.5; Triglycerides 97 Intake / Output; MIVF: NGT clamped 9/14, Abdominal JP drains x 2 output/24h: 560>235, UOP up to 1 ml/kg/hr. LBM 9/17 GI Imaging:  9/8 CT A/P - extensive pneumoperitoneum 9/16 Repeat CT A/P - large amount of residual gas and fluid dissecting through the retroperitoneum, no evidence of enteric fistula or leak  GI Surgeries / Procedures: 9/9 ex-lap (no colonic perforation, large necrotic fluid present surrounding pancreas)  Central access: CVC 9/9 TPN start date: 9/10  Nutritional Goals: Goal TPN rate is 100 mL/hr (provides 132 g of protein and 2390 kcals per day) meeting 100% of needs  RD Assessment: Estimated Needs Total Energy Estimated Needs: 2350-2550 Total Protein Estimated Needs: 125-145 grams Total Fluid Estimated Needs: >/= 2.0 L  Current Nutrition:  NPO and TPN  Plan:  Continue TPN to 100 mL/hr at 1800  Electrolytes in TPN: Na to 46mEq/L,K 40 meq/L; cont Phos 10 meq/L, Ca 31mEq/L, cont Mg 5 mEq/L. Cl:Ac to 1:1  Add standard MVI, trace  elements, folic acid 1mg , and thiamine 100mg  to TPN Continue Resistant q4h SSI Continue insulin R in TPN bag at 70 units and adjust as needed TPN labs in AM  11m, PharmD Clinical Pharmacist ED Pharmacist Phone # [phone] 10/31/2020 7:14 AM

## 2020-10-31 NOTE — Progress Notes (Signed)
Inpatient Rehab Admissions:  Inpatient Rehab Consult received.  I met with patient, Paul Chambers (wife), and Faith (sister) at the bedside for rehabilitation assessment and to discuss goals and expectations of an inpatient rehab admission.  Pt was asleep so spoke with wife and sister.  Both acknowledged understanding of CIR goals and expectations. Both interested in pt pursuing CIR.  Will continue to follow medical workup and progress with therapies.  Signed: Gayland Curry, [place], [place] Admissions Coordinator [phone]

## 2020-10-31 NOTE — Progress Notes (Signed)
[facility] Progress Note  10 Days Post-Op  Subjective: CC-  No acute events overnight.  Denies abdominal pain or nausea this morning but reports headache and is somewhat confused.  Objective: Vital signs in last 24 hours: Temp:  [97.6 F (36.4 C)-101.5 F (38.6 C)] 100.1 F (37.8 C) (09/18 0331) Pulse Rate:  [94-119] 94 (09/18 0800) Resp:  [17-26] 21 (09/18 0800) BP: (128-161)/(72-90) 140/83 (09/18 0800) SpO2:  [87 %-98 %] 96 % (09/18 0800) Weight:  [98.2 kg-98.9 kg] 98.2 kg (09/18 0350) Last BM Date: 10/30/20  Intake/Output from previous day: 09/17 0701 - 09/18 0700 In: 2866.9 [I.V.:2479.7; IV Piggyback:387.1] Out: 2511 [Urine:2275; Drains:235; Stool:1] Intake/Output this shift: Total I/O In: 213 [I.V.:200; IV Piggyback:12.9] Out: -   PE: Gen:  Alert but drowsy, NAD Pulm:  Normal rate and effort  Abd: Distended but soft, generalized tenderness without rigidity or guarding. RUQ drains x2 with brown fluid in bulbs. Midline wound pale pink with early granulation, and clean without dehiscence.    Lab Results:  Recent Labs    10/30/20 0328 10/31/20 0352  WBC 9.8 11.1*  HGB 8.3* 8.4*  HCT 26.9* 27.0*  PLT 104* 112*    BMET Recent Labs    10/30/20 0503 10/31/20 0352  NA 143 144  K 3.9 4.2  CL 107 107  CO2 29 30  GLUCOSE 174* 129*  BUN 15 16  CREATININE 0.52* 0.61  CALCIUM 8.6* 8.6*    PT/INR No results for input(s): LABPROT, INR in the last 72 hours. CMP     Component Value Date/Time   NA 144 10/31/2020 0352   K 4.2 10/31/2020 0352   CL 107 10/31/2020 0352   CO2 30 10/31/2020 0352   GLUCOSE 129 (H) 10/31/2020 0352   BUN 16 10/31/2020 0352   CREATININE 0.61 10/31/2020 0352   CALCIUM 8.6 (L) 10/31/2020 0352   PROT 5.6 (L) 10/31/2020 0352   ALBUMIN 1.5 (L) 10/31/2020 0352   AST 31 10/31/2020 0352   ALT 25 10/31/2020 0352   ALKPHOS 94 10/31/2020 0352   BILITOT 0.8 10/31/2020 0352   GFRNONAA >60 10/31/2020 0352   Lipase      Component Value Date/Time   LIPASE 25 10/28/2020 1506       Studies/Results: CT ABDOMEN PELVIS W CONTRAST  Result Date: 10/30/2020 CLINICAL DATA:  Abdominal pain. EXAM: CT ABDOMEN AND PELVIS WITH CONTRAST TECHNIQUE: Multidetector CT imaging of the abdomen and pelvis was performed using the standard protocol following bolus administration of intravenous contrast. CONTRAST:  OMNIPAQUE IOHEXOL 300 MG/ML  SOLN COMPARISON:  October 21, 2020 FINDINGS: Lower chest: Moderate to marked severity areas of consolidation are seen within the visualized portions of the bilateral lower lobes. This is increased in severity when compared to the prior study. There is a small right pleural effusion. A moderate to large left pleural effusion is also seen. These are increased in size when compared to the prior exam. Hepatobiliary: No focal liver abnormality is seen. No gallstones, gallbladder wall thickening, or biliary dilatation. Pancreas: The pancreas remains poorly visualized. Spleen: Normal in size without focal abnormality. Adrenals/Urinary Tract: Adrenal glands are unremarkable. Kidneys are normal, without renal calculi, focal lesion, or hydronephrosis. Bladder is unremarkable. Stomach/Bowel: A nasogastric tube is in place. Stomach is within normal limits. Appendix appears normal. No evidence of bowel dilatation. Radiopaque contrast is seen throughout the large and small bowel without evidence of contrast extravasation. Two surgical drains are seen entering via the anterolateral aspect of the mid  right abdominal wall. The distal tip of the first catheter sits just above the gallbladder fossa, while the distal tip of the second catheter is seen within the region posterior to the gastric fundus. A large amount of gas and fluid is again seen dissecting through the retroperitoneum. This extends from the posteromedial aspect of the left upper quadrant throughout the mid and left abdomen into the posterior aspect of  the pelvis and is decreased in severity when compared to the prior study. Vascular/Lymphatic: No significant vascular findings are present. No enlarged abdominal or pelvic lymph nodes. Reproductive: Prostate is unremarkable. Other: Two surgical drains are seen entering via the anterolateral aspect of the mid right abdominal wall. This represents a new finding. The distal tip of the first catheter sits just above the gallbladder fossa, while the distal tip of the second catheter is seen within the region posterior to the gastric fundus. A large amount of gas and fluid is again seen dissecting through the retroperitoneum. This extends from the posteromedial aspect of the left upper quadrant throughout the mid and left abdomen into the posterior aspect of the pelvis and is decreased in severity when compared to the prior study. Moderate severity diffuse mesenteric inflammatory fat stranding is noted, most prominent within the mid and upper abdomen. A mild-to-moderate amount of ascites is seen. Musculoskeletal: No acute or significant osseous findings. IMPRESSION: 1. Interval abdominal surgical drain placement and positioning, as described above, with a large amount of residual gas and fluid dissecting through the retroperitoneum. 2. Moderate to marked severity bilateral lower lobe consolidation with bilateral pleural effusions, left greater than right. 3. Mild to moderate amount of ascites. Electronically Signed   By: Aram Candela M.D.   On: 10/30/2020 00:25   DG Abd Portable 1V  Result Date: 10/29/2020 CLINICAL DATA:  Feeding tube placement. EXAM: PORTABLE ABDOMEN - 1 VIEW COMPARISON:  One-view abdomen 10/26/2020 FINDINGS: Tip of a small bore feeding tube is in the distal stomach. Are gas pattern is unremarkable. Heart is enlarged. Left lower lobe airspace disease is again seen. IMPRESSION: Tip of small bore feeding tube in the distal stomach. Electronically Signed   By: Marin Roberts M.D.   On:  10/29/2020 14:21    Anti-infectives: Anti-infectives (From admission, onward)    Start     Dose/Rate Route Frequency Ordered Stop   10/29/20 2200  meropenem (MERREM) 1 g in sodium chloride 0.9 % 100 mL IVPB        1 g 200 mL/hr over 30 Minutes Intravenous Every 8 hours 10/29/20 1428     10/28/20 1615  meropenem (MERREM) 2 g in sodium chloride 0.9 % 100 mL IVPB  Status:  Discontinued        2 g 200 mL/hr over 30 Minutes Intravenous Every 8 hours 10/28/20 1515 10/29/20 1428   10/28/20 1445  metroNIDAZOLE (FLAGYL) IVPB 500 mg  Status:  Discontinued        500 mg 100 mL/hr over 60 Minutes Intravenous Every 12 hours 10/28/20 1348 10/28/20 1505   10/25/20 2300  fluconazole (DIFLUCAN) IVPB 400 mg        400 mg 100 mL/hr over 120 Minutes Intravenous Every 24 hours 10/25/20 0742 10/29/20 0718   10/24/20 2300  fluconazole (DIFLUCAN) IVPB 200 mg  Status:  Discontinued        200 mg 100 mL/hr over 60 Minutes Intravenous Every 24 hours 10/24/20 0943 10/25/20 0742   10/22/20 1800  vancomycin (VANCOREADY) IVPB 1500 mg/300 mL  Status:  Discontinued        1,500 mg 150 mL/hr over 120 Minutes Intravenous Every 24 hours 10/18/2020 2225 10/22/20 0759   10/22/20 0900  linezolid (ZYVOX) IVPB 600 mg  Status:  Discontinued        600 mg 300 mL/hr over 60 Minutes Intravenous Every 12 hours 10/22/20 0800 10/27/20 0852   10/22/20 0300  piperacillin-tazobactam (ZOSYN) IVPB 3.375 g  Status:  Discontinued        3.375 g 12.5 mL/hr over 240 Minutes Intravenous Every 8 hours 11/08/2020 2225 10/28/20 1514   11/06/2020 2335  metroNIDAZOLE (FLAGYL) IVPB 500 mg  Status:  Discontinued        500 mg 100 mL/hr over 60 Minutes Intravenous Every 12 hours 11/07/2020 2142 10/22/20 0747   11/12/2020 2248  fluconazole (DIFLUCAN) IVPB 400 mg  Status:  Discontinued        400 mg 100 mL/hr over 120 Minutes Intravenous Every 24 hours 10/19/2020 2142 10/24/20 0943   11/08/2020 1748  vancomycin (VANCOREADY) IVPB 2000 mg/400 mL        2,000  mg 200 mL/hr over 120 Minutes Intravenous  Once 10/14/2020 1706 10/30/2020 2113   10/20/2020 1715  piperacillin-tazobactam (ZOSYN) IVPB 3.375 g        3.375 g 100 mL/hr over 30 Minutes Intravenous  Once 10/23/2020 1706 11/12/2020 1903        Assessment/Plan POD 9 s/p ex lap with pancreatic debridement for infected necrotic pancreatitis by Dr. Sheliah Hatch on 9/9 - Superior drain under the mesocolon along with the head of the pancreas - Inferior drain under the mesocolon along the left upper abdomen and what is likely a position inferior to the pancreas - Continue drains and monitor - NPO except ice chips 9/15 per SLP - WBC essentially stable.  Intermittent fever. He was switched to [facility] 9/15 from zosyn by primary team for worsening CXR/ pneumonia  -CT 9/17 demonstrates large amount of residual gas and fluid dissecting through the retroperitoneum, no evidence of enteric fistula or leak - Cont TPN for nutrition, if cortrak can be placed well into duodenum consider trying enteral nutrition. Continue current supportive measures for ongoing sequelae of severe infected necrotizing pancreatitis.   FEN: TPN, ice chips ID: merrem VTE: heparin subq Foley - out, voiding    - Per primary -  VDRF - now extubated Abl anemia - s/p 1U PRBC 9/13 w/ appropriate response. ETOH use - weaned off precedex. Consider CIWA AKI - resolved, good uop T2DM   LOS: 10 days    Berna Bue, MD [facility] Surgery 10/31/2020, 8:44 AM Please see Amion for pager number during day hours 7:00am-4:30pm

## 2020-11-01 ENCOUNTER — Inpatient Hospital Stay (HOSPITAL_COMMUNITY): Payer: BC Managed Care – PPO

## 2020-11-01 DIAGNOSIS — R6520 Severe sepsis without septic shock: Secondary | ICD-10-CM | POA: Diagnosis not present

## 2020-11-01 DIAGNOSIS — A419 Sepsis, unspecified organism: Secondary | ICD-10-CM | POA: Diagnosis not present

## 2020-11-01 LAB — COMPREHENSIVE METABOLIC PANEL
ALT: 24 U/L (ref 0–44)
AST: 33 U/L (ref 15–41)
Albumin: 1.5 g/dL — ABNORMAL LOW (ref 3.5–5.0)
Alkaline Phosphatase: 89 U/L (ref 38–126)
Anion gap: 14 (ref 5–15)
BUN: 17 mg/dL (ref 6–20)
CO2: 29 mmol/L (ref 22–32)
Calcium: 8.7 mg/dL — ABNORMAL LOW (ref 8.9–10.3)
Chloride: 99 mmol/L (ref 98–111)
Creatinine, Ser: 0.59 mg/dL — ABNORMAL LOW (ref 0.61–1.24)
GFR, Estimated: >60 mL/min (ref >60)
Glucose, Bld: 212 mg/dL — ABNORMAL HIGH (ref 70–99)
Potassium: 4.2 mmol/L (ref 3.5–5.1)
Sodium: 142 mmol/L (ref 135–145)
Total Bilirubin: 0.7 mg/dL (ref 0.3–1.2)
Total Protein: 5.7 g/dL — ABNORMAL LOW (ref 6.5–8.1)

## 2020-11-01 LAB — CULTURE, RESPIRATORY W GRAM STAIN

## 2020-11-01 LAB — GLUCOSE, CAPILLARY
Glucose-Capillary: 179 mg/dL — ABNORMAL HIGH (ref 70–99)
Glucose-Capillary: 185 mg/dL — ABNORMAL HIGH (ref 70–99)
Glucose-Capillary: 188 mg/dL — ABNORMAL HIGH (ref 70–99)
Glucose-Capillary: 169 mg/dL — ABNORMAL HIGH (ref 70–99)
Glucose-Capillary: 186 mg/dL — ABNORMAL HIGH (ref 70–99)
Glucose-Capillary: 140 mg/dL — ABNORMAL HIGH (ref 70–99)

## 2020-11-01 LAB — MAGNESIUM: Magnesium: 2 mg/dL (ref 1.7–2.4)

## 2020-11-01 LAB — TRIGLYCERIDES: Triglycerides: 68 mg/dL (ref <150)

## 2020-11-01 LAB — PHOSPHORUS: Phosphorus: 2.9 mg/dL (ref 2.5–4.6)

## 2020-11-01 MED ORDER — TRAVASOL 10 % IV SOLN
INTRAVENOUS | Status: AC
  Filled 2020-11-01: qty 1320

## 2020-11-01 MED ORDER — ACETAMINOPHEN 160 MG/5ML PO SOLN
650.0000 mg | Freq: Four times a day (QID) | ORAL | Status: DC | PRN
  Administered 2020-11-01 – 2020-11-15 (×16): 650 mg
  Filled 2020-11-01 (×17): qty 20.3

## 2020-11-01 MED ORDER — LEVOFLOXACIN IN D5W 750 MG/150ML IV SOLN
750.0000 mg | INTRAVENOUS | Status: DC
  Administered 2020-11-01 – 2020-11-06 (×6): 750 mg via INTRAVENOUS
  Filled 2020-11-01 (×6): qty 150

## 2020-11-01 MED ORDER — FUROSEMIDE 10 MG/ML IJ SOLN
40.0000 mg | Freq: Two times a day (BID) | INTRAMUSCULAR | Status: DC
  Administered 2020-11-01 – 2020-11-07 (×13): 40 mg via INTRAVENOUS
  Filled 2020-11-01 (×13): qty 4

## 2020-11-01 MED ORDER — VITAL 1.5 CAL PO LIQD
1000.0000 mL | ORAL | Status: DC
  Administered 2020-11-02 – 2020-11-03 (×3): 1000 mL
  Filled 2020-11-01 (×3): qty 1000

## 2020-11-01 MED ORDER — METRONIDAZOLE 500 MG/100ML IV SOLN
500.0000 mg | Freq: Two times a day (BID) | INTRAVENOUS | Status: DC
  Administered 2020-11-01 – 2020-11-06 (×11): 500 mg via INTRAVENOUS
  Filled 2020-11-01 (×11): qty 100

## 2020-11-01 NOTE — Progress Notes (Signed)
PHARMACY - TOTAL PARENTERAL NUTRITION CONSULT NOTE  Indication:  severe pancreatitis  Patient Measurements: Height: 5\' 9"  (175.3 cm) Weight: 97.9 kg (215 lb 13.3 oz) IBW/kg (Calculated) : 70.7 TPN AdjBW (KG): 78 Body mass index is 31.87 kg/m.  Assessment:  14 yom presenting 9/8 with septic shock due to necrotizing pancreatitis with pneumoperitoneum s/p ex-lap 9/9. Pt transferred to ICU post-op, intubated and sedated. Pharmacy consulted to manage TPN for severe pancreatitis. Pt at risk of refeeding with minimal PO intake x 1 week PTA, decreased PO intake x 1.5 months PTA with significant weight loss and hx heavy alcohol abuse.  Glucose / Insulin: A1c 6.8% (no meds pta). CBGs borderline Used 19 units SSI, 70 units insulin in TPN Electrolytes: all WNL except elevated CoCa at 10.7 (CL low and CO2 high normal, Phos low normal) Renal: AKI resolved - SCr 0.61, BUN wnl (lasix increased to 40mg  IV BID) Hepatic: LFTs / tbili normalized, TG WNL, albumin 1.5 Intake / Output; MIVF: abd JP drains x 2 O/P 11/9, UOP 0.40ml/kg/hr, net +9.6L, LBM 9/17 GI Imaging: 9/8 CT A/P - extensive pneumoperitoneum 9/16 Repeat CT A/P - large amount of residual gas and fluid dissecting through retroperitoneum, no evidence of enteric fistula or leak  GI Surgeries / Procedures:  9/9 ex-lap (no colonic perforation, large necrotic fluid present surrounding pancreas)  Central access: CVC 10/22/20 TPN start date: 10/23/20  Nutritional Goals, RD Estimated Needs Total Energy Estimated Needs: 2350-2550 Total Protein Estimated Needs: 125-145 grams Total Fluid Estimated Needs: >/= 2.0 L  Current Nutrition:  TPN  Plan:  Continue TPN at goal rate of 100 mL/hr to provide 132g AA, 336g CHO and 72g ILE for a total of 2390 kCal, meeting 100% of patient's needs Electrolytes in TPN: Na 37mEq/L, K 40 mEq/L, reduce Ca to 42mEq/L, Mg 74mEq/L, increase Phos slightly to 28mmol/L, change to max CL (~2:1 ratio) Add standard MVI, trace  elements, folic acid 1mg , and thiamine 100mg  to TPN Continue resistant SSI Q4H and increase regular insulin in TPN to 80 units Standard TPN labs on Mon and Thurs Monitor volume status  Tashyra Adduci D. , PharmD, BCPS, BCCCP 11/01/2020, 8:41 AM

## 2020-11-01 NOTE — Progress Notes (Signed)
Physical Therapy Treatment Patient Details Name: Paul Chambers MRN: [id number] DOB: July 27, 1965 Today's Date: 11/01/2020   History of Present Illness Pt adm 9/8 with abdominal pain and AMS. Pt found to have extensive pneumoperitoneum and underwent ex lap on 9/9. Pt with infected necrotizing pancreatitis. Pt with acute hypoxic respiratory failure. Pt intubated 9/9-9/12. PMH - etoh, HTN, gout, pancreatitis.    PT Comments    Pt tolerated treatment well. At beginning of session, pt desat on RA to 87% and O2 was increased up to 4L to maintain sats >90% during session. Pt required decreased assist transfers compared to previous session. Assist on bed mobility remained similar to last session. Pt progressed to ambulation in hallway with min guard +2 for chair follow and management of lines. As pt is progressing toward goals and tolerating treatment, continue to recommend CIR.   Recommendations for follow up therapy are one component of a multi-disciplinary discharge planning process, led by the attending physician.  Recommendations may be updated based on patient status, additional functional criteria and insurance authorization.  Follow Up Recommendations  CIR     Equipment Recommendations  Rolling walker with 5" wheels    Recommendations for Other Services       Precautions / Restrictions Precautions Precautions: Fall Precaution Comments: JP drains out of ostomy bag Restrictions Weight Bearing Restrictions: No     Mobility  Bed Mobility Overal bed mobility: Needs Assistance Bed Mobility: Sit to Supine     Supine to sit: Min assist     General bed mobility comments: Min A to elevate trunk with verbal cues to navigate LEs off EOB Patient Response: Cooperative  Transfers Overall transfer level: Needs assistance Equipment used: Rolling walker (2 wheeled) Transfers: Sit to/from Stand Sit to Stand: Min assist         General transfer comment: Sit to stand x2 with min A to  boost to stand. Verbal cues provided for hand placement. Good recall of hand placement during 2nd trial.  Ambulation/Gait Ambulation/Gait assistance: Min guard;+2 safety/equipment Gait Distance (Feet): 30 Feet Assistive device: Rolling walker (2 wheeled) Gait Pattern/deviations: Step-through pattern;Decreased stride length;Trunk flexed Gait velocity: decreased Gait velocity interpretation: <1.8 ft/sec, indicate of risk for recurrent falls General Gait Details: Ambulated 30' x2 with seated rest in between. First trial +2 for chair follow, 2nd trial +2 for line management. Min guard for safety with verbal cues to maintain body within RW.   Stairs             Wheelchair Mobility    Modified Rankin (Stroke Patients Only)       Balance Overall balance assessment: Needs assistance Sitting-balance support: Feet supported;Bilateral upper extremity supported Sitting balance-Leahy Scale: Fair Sitting balance - Comments: Able to maintain balance without UE use. Used BUE intermittently.   Standing balance support: Bilateral upper extremity supported;During functional activity Standing balance-Leahy Scale: Poor Standing balance comment: Reliant on BUE on RW                            Cognition Arousal/Alertness: Awake/alert Behavior During Therapy: WFL for tasks assessed/performed                     Orientation Level: Disoriented to;Time     Following Commands: Follows one step commands inconsistently       General Comments: Pt disoriented to time during session. Inconsistently followed commands and required verbal cueing      Exercises General Exercises -  Lower Extremity Long Arc Quad: AROM;Seated;Other (comment);20 reps Hip Flexion/Marching: AROM;10 reps;Seated    General Comments General comments (skin integrity, edema, etc.): Pt on 4L upon arrival. Decreased O2 to RA at start of session. Desat on RA to 87% at EOB. Increased O2 up to 4L during  ambulation to maintain sats >90%. Placed on 2L at end of session with sats >90%      Pertinent Vitals/Pain Pain Assessment: No/denies pain    Home Living                      Prior Function            PT Goals (current goals can now be found in the care plan section) Progress towards PT goals: Progressing toward goals    Frequency    Min 3X/week      PT Plan Current plan remains appropriate    Co-evaluation              AM-PAC PT "6 Clicks" Mobility   Outcome Measure  Help needed turning from your back to your side while in a flat bed without using bedrails?: A Lot Help needed moving from lying on your back to sitting on the side of a flat bed without using bedrails?: A Lot Help needed moving to and from a bed to a chair (including a wheelchair)?: A Little Help needed standing up from a chair using your arms (e.g., wheelchair or bedside chair)?: A Little Help needed to walk in hospital room?: A Lot Help needed climbing 3-5 steps with a railing? : Total 6 Click Score: 13    End of Session Equipment Utilized During Treatment: Gait belt Activity Tolerance: Patient tolerated treatment well Patient left: in chair;with chair alarm set;with family/visitor present Nurse Communication: Mobility status PT Visit Diagnosis: Unsteadiness on feet (R26.81);Other abnormalities of gait and mobility (R26.89);Muscle weakness (generalized) (M62.81)     Time: 8115-7262 PT Time Calculation (min) (ACUTE ONLY): 48 min  Charges:  $Gait Training: 8-22 mins $Therapeutic Exercise: 8-22 mins $Therapeutic Activity: 8-22 mins                     Velda Shell, SPT Acute Rehab: [phone]    Vance Gather 11/01/2020, 12:52 PM

## 2020-11-01 NOTE — Progress Notes (Signed)
Speech Language Pathology Treatment: Dysphagia  Patient Details Name: Paul Chambers MRN: [id number] DOB: October 09, 1965 Today's Date: 11/01/2020 Time: 0539-7673 SLP Time Calculation (min) (ACUTE ONLY): 10 min  Assessment / Plan / Recommendation Clinical Impression  Pt demonstrates improvement in strength and swallowing. His vocal quality is clear, RN reports he has diuresed well over the weekend and still has a mild congested cough, but strong and vocal quality clear. He has toelrated ice with staff well. Pt today able to take sips of water without signs of aspiration. He is not cleared for oral intake by surgery yet. From a swallowing standpoint he would be ready to advance, but await clarification from surgeon first. Did not trial any solids today given restrictions. Will f/u.   HPI HPI: 55 y.o. Caucasian male smoker presented to the [facility] Emergency Department via private vehicle with complaints of abdominal pain and altered mental status. 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 ETT 9/9 to 9/12. Pt with post op ileus. Pulled NG, SLP consulted for clinical swallow assessment with clear liquids.      SLP Plan         Recommendations for follow up therapy are one component of a multi-disciplinary discharge planning process, led by the attending physician.  Recommendations may be updated based on patient status, additional functional criteria and insurance authorization.    Recommendations  Medication Administration: Via alternative means                Oral Care Recommendations: Oral care BID Follow up Recommendations: 24 hour supervision/assistance SLP Visit Diagnosis: Dysphagia, unspecified (R13.10)       GO                Paul Chambers, Riley Nearing  11/01/2020, 10:15 AM

## 2020-11-01 NOTE — Progress Notes (Signed)
Nutrition Brief Note  Cortrak has been placed. Tip is postpyloric per Cortrak reading. X-ray confirmation is pending. Okay to begin a trial of tube feeding via Cortrak per Surgery note. Will start trickle tube feedings with Vital 1.5 at 20 ml/h. RD to monitor tube feeding tolerance and advance tube feeding rate as clinically able.   Gabriel Rainwater, RD, LDN, CNSC Please refer to [facility] for contact information.

## 2020-11-01 NOTE — Progress Notes (Signed)
Occupational Therapy Treatment Patient Details Name: Paul Chambers MRN: [id number] DOB: 02/13/1966 Today's Date: 11/01/2020   History of present illness Pt adm 9/8 with abdominal pain and AMS. Pt found to have extensive pneumoperitoneum and underwent ex lap on 9/9. Pt with infected necrotizing pancreatitis. Pt with acute hypoxic respiratory failure. Pt intubated 9/9-9/12. PMH - etoh, HTN, gout, pancreatitis.   OT comments  OT treatment session with focus on self-care re-education, activity tolerance, balance and functional transfers. Patient completed 2/3 grooming tasks seated in recliner. Further mobility/activity limited 2/2 elevated HR. Sit to stand x3 from recliner with Min guard and cues for hand placement. HR increased to 130 bpm. Patient education on recommendation for CIR and expectations of rehab. Patient/family expressed verbal understanding. OT will continue to follow acutely.    Recommendations for follow up therapy are one component of a multi-disciplinary discharge planning process, led by the attending physician.  Recommendations may be updated based on patient status, additional functional criteria and insurance authorization.    Follow Up Recommendations  CIR    Equipment Recommendations  3 in 1 bedside commode;None recommended by OT;Tub/shower seat    Recommendations for Other Services      Precautions / Restrictions Precautions Precautions: Fall Precaution Comments: JP drains out of ostomy bag Restrictions Weight Bearing Restrictions: No       Mobility Bed Mobility Overal bed mobility: Needs Assistance Bed Mobility: Sit to Supine     Supine to sit: Min assist     General bed mobility comments: Seated in recliner upon entry.    Transfers Overall transfer level: Needs assistance Equipment used: Rolling walker (2 wheeled) Transfers: Sit to/from Stand Sit to Stand: Min guard         General transfer comment: Sit to stand from recliner x3 with Min guard  and cues for hand placement.    Balance Overall balance assessment: Needs assistance Sitting-balance support: Feet supported;Bilateral upper extremity supported Sitting balance-Leahy Scale: Fair Sitting balance - Comments: Able to maintain unsupported sitting balance in recliner during seated grooming tasks.   Standing balance support: Bilateral upper extremity supported;During functional activity Standing balance-Leahy Scale: Poor Standing balance comment: Reliant on BUE on RW                           ADL either performed or assessed with clinical judgement   ADL Overall ADL's : Needs assistance/impaired     Grooming: Wash/dry hands;Wash/dry face;Minimal assistance;Sitting Grooming Details (indicate cue type and reason): Seated in recliner. Upper Body Bathing: Minimal assistance;Sitting   Lower Body Bathing: Moderate assistance;Sit to/from stand   Upper Body Dressing : Minimal assistance;Sitting   Lower Body Dressing: Moderate assistance;Sit to/from stand   Toilet Transfer: Min guard;+2 for safety/equipment;RW                   Vision       Perception     Praxis      Cognition Arousal/Alertness: Awake/alert Behavior During Therapy: WFL for tasks assessed/performed                     Orientation Level: Disoriented to;Time     Following Commands: Follows one step commands inconsistently Safety/Judgement: Decreased awareness of safety;Decreased awareness of deficits     General Comments: Inconsistently followed commands and required verbal cueing.        Exercises General Exercises - Lower Extremity Long Arc Quad: AROM;Seated;Other (comment);20 reps Hip Flexion/Marching: AROM;10 reps;Seated  Shoulder Instructions       General Comments Patient on 4L throughout, SpO2 93% at rest. Desat to 78% with grooming tasks in sitting (poor pleth). Family member present at bedside. HR 116 at rest and 130 max with sit to stand transfers.     Pertinent Vitals/ Pain       Pain Assessment: No/denies pain  Home Living                                          Prior Functioning/Environment              Frequency  Min 2X/week        Progress Toward Goals  OT Goals(current goals can now be found in the care plan section)  Progress towards OT goals: Progressing toward goals  Acute Rehab OT Goals Patient Stated Goal: To go to CIR. OT Goal Formulation: With patient Time For Goal Achievement: 11/12/20 Potential to Achieve Goals: Good ADL Goals Pt Will Perform Grooming: with set-up;sitting Pt Will Perform Upper Body Bathing: sitting;with supervision Pt Will Perform Lower Body Bathing: with min assist;sit to/from stand Pt Will Perform Upper Body Dressing: with supervision;sitting Pt Will Perform Lower Body Dressing: with min assist;sit to/from stand Pt Will Transfer to Toilet: with min assist;ambulating;regular height toilet Pt/caregiver will Perform Home Exercise Program: Increased strength;Both right and left upper extremity;With theraband;With Supervision;With written HEP provided  Plan Discharge plan remains appropriate;Frequency remains appropriate    Co-evaluation                 AM-PAC OT "6 Clicks" Daily Activity     Outcome Measure   Help from another person eating meals?: A Little Help from another person taking care of personal grooming?: A Little Help from another person toileting, which includes using toliet, bedpan, or urinal?: A Lot Help from another person bathing (including washing, rinsing, drying)?: A Lot Help from another person to put on and taking off regular upper body clothing?: A Lot Help from another person to put on and taking off regular lower body clothing?: A Lot 6 Click Score: 14    End of Session Equipment Utilized During Treatment: Gait belt;Rolling walker  OT Visit Diagnosis: Unsteadiness on feet (R26.81);Muscle weakness (generalized) (M62.81);Other  symptoms and signs involving cognitive function;Pain Pain - part of body:  (Abdomen)   Activity Tolerance Patient tolerated treatment well   Patient Left in chair;with call bell/phone within reach;with family/visitor present;with restraints reapplied   Nurse Communication Mobility status        Time: 1356-1420 OT Time Calculation (min): 24 min  Charges: OT General Charges $OT Visit: 1 Visit OT Treatments $Self Care/Home Management : 8-22 mins $Therapeutic Activity: 8-22 mins  Manar Smalling H. OTR/L Supplemental OT, Department of rehab services [phone]  Almeter Westhoff R H. 11/01/2020, 2:40 PM

## 2020-11-01 NOTE — Progress Notes (Signed)
11 Days Post-Op   Subjective/Chief Complaint: No acute changes over night Still requiring restraints   Objective: Vital signs in last 24 hours: Temp:  [99.9 F (37.7 C)-101.2 F (38.4 C)] 100.8 F (38.2 C) (09/19 0743) Pulse Rate:  [97-109] 97 (09/19 0600) Resp:  [16-27] 22 (09/19 0600) BP: (132-152)/(71-94) 132/71 (09/19 0600) SpO2:  [90 %-100 %] 95 % (09/19 0902) Weight:  [97.9 kg] 97.9 kg (09/19 0500) Last BM Date: 10/30/20  Intake/Output from previous day: 09/18 0701 - 09/19 0700 In: 2610.9 [I.V.:2387.1; IV Piggyback:223.8] Out: 2310 [Urine:2050; Drains:260] Intake/Output this shift: No intake/output data recorded.  Exam: Awake and alert Abdomen distended, no peritonitis, drains with tan brown fluid  Lab Results:  Recent Labs    10/30/20 0328 10/31/20 0352  WBC 9.8 11.1*  HGB 8.3* 8.4*  HCT 26.9* 27.0*  PLT 104* 112*   BMET Recent Labs    10/31/20 0352 11/01/20 0358  NA 144 142  K 4.2 4.2  CL 107 99  CO2 30 29  GLUCOSE 129* 212*  BUN 16 17  CREATININE 0.61 0.59*  CALCIUM 8.6* 8.7*   PT/INR No results for input(s): LABPROT, INR in the last 72 hours. ABG No results for input(s): PHART, HCO3 in the last 72 hours.  Invalid input(s): PCO2, PO2  Studies/Results: DG CHEST PORT 1 VIEW  Result Date: 10/31/2020 CLINICAL DATA:  Hypoxia and sepsis. EXAM: PORTABLE CHEST 1 VIEW COMPARISON:  Chest x-ray 10/28/2020, chest x-ray 10/17/2020, CT chest 10/29/2020, CT chest 11/12/2020 FINDINGS: Right PICC with tip overlying the expected region of the superior cavoatrial junction. Enteric tube coursing below the hemidiaphragm with tip collimated off view. The heart and mediastinal contours are unchanged. Slightly more prominent mass-like 2.6 cm opacity within the right lower lobe consistent with known infection. Left lower lobe retrocardiac opacity again noted with air bronchograms. No pulmonary edema. Persistent, possibly decreased in size, small left pleural effusion.  Trace right pleural effusion. No pneumothorax. No acute osseous abnormality. IMPRESSION: Bilateral lower lobe airspace opacities with associated bilateral trace to small volume pleural effusions, left greater than right. Stable to possibly slightly improved left pleural effusion. Slightly increased right lower lobe airspace opacity. Followup PA and lateral chest X-ray is recommended in 3-4 weeks following therapy to ensure resolution and exclude underlying malignancy. Right PICC in Electronically Signed   By: Tish Frederickson M.D.   On: 10/31/2020 15:17    Anti-infectives: Anti-infectives (From admission, onward)    Start     Dose/Rate Route Frequency Ordered Stop   11/01/20 0900  levofloxacin (LEVAQUIN) IVPB 750 mg        750 mg 100 mL/hr over 90 Minutes Intravenous Every 24 hours 11/01/20 0824     11/01/20 0900  metroNIDAZOLE (FLAGYL) IVPB 500 mg        500 mg 100 mL/hr over 60 Minutes Intravenous Every 12 hours 11/01/20 0824     10/29/20 2200  meropenem (MERREM) 1 g in sodium chloride 0.9 % 100 mL IVPB  Status:  Discontinued        1 g 200 mL/hr over 30 Minutes Intravenous Every 8 hours 10/29/20 1428 11/01/20 0824   10/28/20 1615  meropenem (MERREM) 2 g in sodium chloride 0.9 % 100 mL IVPB  Status:  Discontinued        2 g 200 mL/hr over 30 Minutes Intravenous Every 8 hours 10/28/20 1515 10/29/20 1428   10/28/20 1445  metroNIDAZOLE (FLAGYL) IVPB 500 mg  Status:  Discontinued  500 mg 100 mL/hr over 60 Minutes Intravenous Every 12 hours 10/28/20 1348 10/28/20 1505   10/25/20 2300  fluconazole (DIFLUCAN) IVPB 400 mg        400 mg 100 mL/hr over 120 Minutes Intravenous Every 24 hours 10/25/20 0742 10/29/20 0718   10/24/20 2300  fluconazole (DIFLUCAN) IVPB 200 mg  Status:  Discontinued        200 mg 100 mL/hr over 60 Minutes Intravenous Every 24 hours 10/24/20 0943 10/25/20 0742   10/22/20 1800  vancomycin (VANCOREADY) IVPB 1500 mg/300 mL  Status:  Discontinued        1,500 mg 150  mL/hr over 120 Minutes Intravenous Every 24 hours 10/15/2020 2225 10/22/20 0759   10/22/20 0900  linezolid (ZYVOX) IVPB 600 mg  Status:  Discontinued        600 mg 300 mL/hr over 60 Minutes Intravenous Every 12 hours 10/22/20 0800 10/27/20 0852   10/22/20 0300  piperacillin-tazobactam (ZOSYN) IVPB 3.375 g  Status:  Discontinued        3.375 g 12.5 mL/hr over 240 Minutes Intravenous Every 8 hours 11/07/2020 2225 10/28/20 1514   10/26/2020 2335  metroNIDAZOLE (FLAGYL) IVPB 500 mg  Status:  Discontinued        500 mg 100 mL/hr over 60 Minutes Intravenous Every 12 hours 10/20/2020 2142 10/22/20 0747   11/05/2020 2248  fluconazole (DIFLUCAN) IVPB 400 mg  Status:  Discontinued        400 mg 100 mL/hr over 120 Minutes Intravenous Every 24 hours 10/25/2020 2142 10/24/20 0943   10/20/2020 1748  vancomycin (VANCOREADY) IVPB 2000 mg/400 mL        2,000 mg 200 mL/hr over 120 Minutes Intravenous  Once 11/03/2020 1706 10/23/2020 2113   10/15/2020 1715  piperacillin-tazobactam (ZOSYN) IVPB 3.375 g        3.375 g 100 mL/hr over 30 Minutes Intravenous  Once 11/11/2020 1706 11/11/2020 1903       Assessment/Plan: POD 10 s/p ex lap with pancreatic debridement for infected necrotic pancreatitis by Dr. Sheliah Hatch on 9/9 - Superior drain under the mesocolon along with the head of the pancreas - Inferior drain under the mesocolon along the left upper abdomen and what is likely a position inferior to the pancreas - Continue drains and monitor - NPO except ice chips 9/15 per SLP - WBC up a little.  Intermittent fever. On merrem 9/15 from zosyn by primary team for worsening CXR/ pneumonia  -CT 9/17 demonstrates large amount of residual gas and fluid dissecting through the retroperitoneum, no evidence of enteric fistula or leak - Cont TPN for nutrition -cortrack placed.  Ok to try tube feeds from a surgical standpoint   Abigail Miyamoto MD 11/01/2020

## 2020-11-01 NOTE — Progress Notes (Signed)
PROGRESS NOTE    Paul Chambers  UVO:[id number] DOB: [birth year] DOA: 10/20/2020 PCP: Pcp, No   Brief Narrative:  This 55 y.o. Caucasian male smoker presented to the [facility] Emergency Department via private vehicle with complaints of abdominal pain and altered mental status. The patient's wife reports that this is the third presentation to a healthcare facility in the past month for abdominal pain ([facility], [facility] and now [facility]).  The patient's deteriorating mental status over the past 2 days prompted the wife to force the patient to come to the hospital.  In the ER, CT head was negative for acute process, but CT abdomen showed an impressive pneumoperitoneum without an obvious location of perforation.  Surgery evaluated the patient and decided for exploratory laparotomy which was performed on 10/22/2020.  Sequence of events as below. Significant Hospital Events: Including procedures, antibiotic start and stop dates in addition to other pertinent events   9/8 - Presented to [facility] with abdominal pain. CT A/P with extensive pneumoperitoneum 9/9 - Ex-lap (no colonic perforation discovered, large necrotic fluid present surrounding pancreas. Transferred to ICU post-op, intubated and sedated.  9/10-received 1 unit PRBC for hemoglobin 6.7, went back on Levophed for short period of time 9/12 - Extubated to Venturi mask 9/13 - Off Fentanyl and Propofol gtt 10/28/20.  Patient transferred under TRH.  Assessment & Plan:   Principal Problem:   Severe sepsis (HCC) Active Problems:   Acute necrotizing pancreatitis   Toxic encephalopathy   Hyponatremia   Hypoalbuminemia   Macrocytic anemia   Pressure injury of skin   Protein-calorie malnutrition, severe   Infected Necrotizing Pancreatitis with Pneumoperitoneum s/p ex-lap by Dr. Drexel Iha on 10/22/2020: - General surgery following; appreciate their recommendations.  - Continue IV antibiotics with Zosyn and Fluconazole, 7  day planned - Discontinued Linezolid, as he has completed course.  - Continue TPN.  He pulled out his NG tube on 10/27/2020. Continues to have fever, had 101.2 fever around 4 PM last night and 100.8 this morning.  CT abdomen repeated 10/29/2020 by general surgery shows large amount of residual gas and fluid dissecting through the retroperitoneum, no evidence of enteric fistula or leak.  Still has intermittent tachycardia and tachypnea. Patient remains on antibiotics and supportive therapy, management per general surgery.   Acute respiratory failure with hypoxia/possible aspiration pneumonia/congestive heart failure/left pleural effusion: Intubated 10/22/2020 and extubated 10/26/2020.  Currently on 2 L oxygen.  Repeat chest x-ray 10/28/2020 shows worsening/increased left lung opacity concerning for pneumonia or atelectasis and associated left pleural effusion.  Antibiotics transition to Merrem.  Repeat procalcitonin improving.  Patient's respiratory status and lung exam is also improving while he is on Lasix.  However repeat chest x-ray on 10/31/2020 shows improved left pleural effusion but increased right lower base opacity, recommendation to repeat chest x-ray in 3 to 4 weeks.  Patient to stenotrophomonas maltophilia in the sputum culture, antibiotics transition to Levaquin and Flagyl today 11/01/2020 per pharmacy recommendations.  Will increase Lasix to 40 mg IV twice daily and reassess tomorrow.   Acute toxic-metabolic encephalopathy: Sepsis +/- ICU delirium: He is more alert and oriented today.  Maintain delirium precautions.  Ileus: Resolving.  Managed by general surgery.   Acute hepatitis: Stable. 2/2 to TPN - CMP daily    Alcohol abuse: Out of the window for withdrawal complications.   - Continue thiamine and folate   Acute Blood Loss Anemia, postsurgical: S/p 1 unit of pRBCs on 9/10 with stabilization in hemoglobin since.  - Monitor  H&H and transfuse if less than 7   Severe protein calorie  malnutrition - Continue TPN   Diabetes type 2 with hyperglycemia: Hemoglobin A1c was 6.8% on admission.  Blood sugar controlled.  He is not on any Lantus.  Continue SSI.   Hypokalemia: Resolved.  Hypophosphatemia: Replenished.  Dysphagia: Assessed by SLP.  Per SLP, patient is now more awake and alert and able to swallow.  Defer to general surgery when to advance the diet.   DVT prophylaxis: heparin injection 5,000 Units Start: 10/22/20 1400 Place and maintain sequential compression device Start: 11/09/2020 2202 SCDs Start: 11/05/2020 2145   Code Status: Full Code  Family Communication: None present at bedside.   Status is: Inpatient  Remains inpatient appropriate because:Inpatient level of care appropriate due to severity of illness  Dispo: The patient is from: Home              Anticipated d/c is to: SNF              Patient currently is not medically stable to d/c.   Difficult to place patient No        Estimated body mass index is 31.87 kg/m as calculated from the following:   Height as of this encounter: 5\' 9"  (1.753 m).   Weight as of this encounter: 97.9 kg.     Nutritional Assessment: Body mass index is 31.87 kg/m. Seen by dietician.  I agree with the assessment and plan as outlined below: Nutrition Status: Nutrition Problem: Severe Malnutrition Etiology: acute illness (infected necrotic pancreatitis) Signs/Symptoms: moderate fat depletion, moderate muscle depletion, energy intake < or equal to 50% for > or equal to 5 days, percent weight loss (8.4% weight loss in 1.5 months) Percent weight loss: 8.4 % (1.5 months) Interventions: Refer to RD note for recommendations  .  Skin Assessment: I have examined the patient's skin and I agree with the wound assessment as performed by the wound care RN as outlined below:    Consultants:  General surgery  Procedures:  As above  Antimicrobials:  Anti-infectives (From admission, onward)    Start     Dose/Rate  Route Frequency Ordered Stop   11/01/20 0900  levofloxacin (LEVAQUIN) IVPB 750 mg        750 mg 100 mL/hr over 90 Minutes Intravenous Every 24 hours 11/01/20 0824     11/01/20 0900  metroNIDAZOLE (FLAGYL) IVPB 500 mg        500 mg 100 mL/hr over 60 Minutes Intravenous Every 12 hours 11/01/20 0824     10/29/20 2200  meropenem (MERREM) 1 g in sodium chloride 0.9 % 100 mL IVPB  Status:  Discontinued        1 g 200 mL/hr over 30 Minutes Intravenous Every 8 hours 10/29/20 1428 11/01/20 0824   10/28/20 1615  meropenem (MERREM) 2 g in sodium chloride 0.9 % 100 mL IVPB  Status:  Discontinued        2 g 200 mL/hr over 30 Minutes Intravenous Every 8 hours 10/28/20 1515 10/29/20 1428   10/28/20 1445  metroNIDAZOLE (FLAGYL) IVPB 500 mg  Status:  Discontinued        500 mg 100 mL/hr over 60 Minutes Intravenous Every 12 hours 10/28/20 1348 10/28/20 1505   10/25/20 2300  fluconazole (DIFLUCAN) IVPB 400 mg        400 mg 100 mL/hr over 120 Minutes Intravenous Every 24 hours 10/25/20 0742 10/29/20 0718   10/24/20 2300  fluconazole (DIFLUCAN) IVPB 200 mg  Status:  Discontinued        200 mg 100 mL/hr over 60 Minutes Intravenous Every 24 hours 10/24/20 0943 10/25/20 0742   10/22/20 1800  vancomycin (VANCOREADY) IVPB 1500 mg/300 mL  Status:  Discontinued        1,500 mg 150 mL/hr over 120 Minutes Intravenous Every 24 hours 10/25/2020 2225 10/22/20 0759   10/22/20 0900  linezolid (ZYVOX) IVPB 600 mg  Status:  Discontinued        600 mg 300 mL/hr over 60 Minutes Intravenous Every 12 hours 10/22/20 0800 10/27/20 0852   10/22/20 0300  piperacillin-tazobactam (ZOSYN) IVPB 3.375 g  Status:  Discontinued        3.375 g 12.5 mL/hr over 240 Minutes Intravenous Every 8 hours 10/19/2020 2225 10/28/20 1514   10/30/2020 2335  metroNIDAZOLE (FLAGYL) IVPB 500 mg  Status:  Discontinued        500 mg 100 mL/hr over 60 Minutes Intravenous Every 12 hours 10/15/2020 2142 10/22/20 0747   10/26/2020 2248  fluconazole (DIFLUCAN) IVPB  400 mg  Status:  Discontinued        400 mg 100 mL/hr over 120 Minutes Intravenous Every 24 hours 11/05/2020 2142 10/24/20 0943   10/19/2020 1748  vancomycin (VANCOREADY) IVPB 2000 mg/400 mL        2,000 mg 200 mL/hr over 120 Minutes Intravenous  Once 10/16/2020 1706 10/14/2020 2113   10/28/2020 1715  piperacillin-tazobactam (ZOSYN) IVPB 3.375 g        3.375 g 100 mL/hr over 30 Minutes Intravenous  Once 10/20/2020 1706 10/25/2020 1903          Subjective: Patient seen and examined.  Primary RN at the bedside.  Patient definitely more alert and oriented and his voice is stronger and he is more fluent today as well.  Denies any complaint.  No shortness of breath, chest pain or abdominal pain.  Objective: Vitals:   11/01/20 0743 11/01/20 0800 11/01/20 0900 11/01/20 0902  BP:  (!) 151/84 139/87   Pulse:  (!) 106 (!) 103   Resp:  18 18   Temp: (!) 100.8 F (38.2 C)     TempSrc: Oral     SpO2:  99% 96% 95%  Weight:      Height:        Intake/Output Summary (Last 24 hours) at 11/01/2020 1112 Last data filed at 11/01/2020 0900 Gross per 24 hour  Intake 2101.67 ml  Output 2860 ml  Net -758.33 ml    Filed Weights   10/30/20 1111 10/31/20 0350 11/01/20 0500  Weight: 98.9 kg 98.2 kg 97.9 kg    Examination:  General exam: Appears calm and comfortable  Respiratory system: Very faint rhonchi bilaterally with slightly diminished breath sounds at the left base. Respiratory effort normal. Cardiovascular system: S1 & S2 heard, RRR. No JVD, murmurs, rubs, gallops or clicks. No pedal edema. Gastrointestinal system: Abdomen is moderately distended, slightly firm but nontender. No organomegaly or masses felt. Normal bowel sounds heard. Central nervous system: Alert and oriented. No focal neurological deficits. Extremities: Symmetric 5 x 5 power. Skin: No rashes, lesions or ulcers.  Psychiatry: Judgement and insight appear normal. Mood & affect appropriate.    Data Reviewed: I have personally  reviewed following labs and imaging studies  CBC: Recent Labs  Lab 10/27/20 0500 10/28/20 0212 10/29/20 1008 10/30/20 0328 10/31/20 0352  WBC 16.5* 16.7* 12.4* 9.8 11.1*  NEUTROABS 15.7* 15.2* 10.3* 7.3 8.0*  HGB 8.2* 8.6* 8.6* 8.3* 8.4*  HCT 25.5*  27.3* 26.8* 26.9* 27.0*  MCV 104.1* 106.2* 105.9* 119.6* 107.6*  PLT 158 140* 114* 104* 112*    Basic Metabolic Panel: Recent Labs  Lab 10/26/20 0401 10/26/20 1811 10/27/20 0500 10/28/20 0212 10/29/20 0630 10/30/20 0503 10/31/20 0352 11/01/20 0358  NA 135   < > 138 143 143 143 144 142  K 3.5   < > 3.3* 3.5 3.7 3.9 4.2 4.2  CL 101   < > 104 108 108 107 107 99  CO2 25   < > 25 27 29 29 30 29   GLUCOSE 123*   < > 252* 154* 177* 174* 129* 212*  BUN 37*   < > 22* 15 13 15 16 17   CREATININE 1.12   < > 0.87 0.69 0.57* 0.52* 0.61 0.59*  CALCIUM 8.1*   < > 8.3* 8.7* 8.7* 8.6* 8.6* 8.7*  MG 2.2  --  2.1 2.0  --   --   --  2.0  PHOS 2.3*  --  2.6 2.1*  --  2.6  --  2.9   < > = values in this interval not displayed.    GFR: Estimated Creatinine Clearance: 124.7 mL/min (A) (by C-G formula based on SCr of 0.59 mg/dL (L)). Liver Function Tests: Recent Labs  Lab 10/28/20 0212 10/29/20 0630 10/30/20 0503 10/31/20 0352 11/01/20 0358  AST 35 28 28 31  33  ALT 30 28 27 25 24   ALKPHOS 141* 147* 119 94 89  BILITOT 0.9 0.6 0.8 0.8 0.7  PROT 5.3* 5.6* 5.4* 5.6* 5.7*  ALBUMIN 1.5* 1.5* 1.5* 1.5* 1.5*    No results for input(s): LIPASE, AMYLASE in the last 168 hours.  No results for input(s): AMMONIA in the last 168 hours.  Coagulation Profile: No results for input(s): INR, PROTIME in the last 168 hours.  Cardiac Enzymes: No results for input(s): CKTOTAL, CKMB, CKMBINDEX, TROPONINI in the last 168 hours.  BNP (last 3 results) No results for input(s): PROBNP in the last 8760 hours. HbA1C: No results for input(s): HGBA1C in the last 72 hours. CBG: Recent Labs  Lab 10/31/20 1531 10/31/20 1920 10/31/20 2317 11/01/20 0403  11/01/20 0742  GLUCAP 147* 145* 163* 179* 185*    Lipid Profile: Recent Labs    11/01/20 0358  TRIG 68   Thyroid Function Tests: No results for input(s): TSH, T4TOTAL, FREET4, T3FREE, THYROIDAB in the last 72 hours. Anemia Panel: No results for input(s): VITAMINB12, FOLATE, FERRITIN, TIBC, IRON, RETICCTPCT in the last 72 hours. Sepsis Labs: Recent Labs  Lab 10/28/20 1347 10/31/20 0937  PROCALCITON 2.38 1.12     Recent Results (from the past 240 hour(s))  Expectorated Sputum Assessment w Gram Stain, Rflx to Resp Cult     Status: None   Collection Time: 10/28/20  2:57 PM   Specimen: Expectorated Sputum  Result Value Ref Range Status   Specimen Description EXPECTORATED SPUTUM  Final   Special Requests NONE  Final   Sputum evaluation   Final    THIS SPECIMEN IS ACCEPTABLE FOR SPUTUM CULTURE Performed at [facility], [address] [place], 10/30/20 [facility]    Report Status 10/30/2020 FINAL  Final  Culture, Respiratory w Gram Stain     Status: None   Collection Time: 10/28/20  2:57 PM  Result Value Ref Range Status   Specimen Description EXPECTORATED SPUTUM  Final   Special Requests NONE Reflexed from [place]  Final   Gram Stain   Final    FEW SQUAMOUS EPITHELIAL CELLS  PRESENT MODERATE WBC PRESENT,BOTH PMN AND MONONUCLEAR ABUNDANT GRAM NEGATIVE RODS Performed at [facility], [address], [place], [place] [address]    Culture MODERATE STENOTROPHOMONAS MALTOPHILIA  Final   Report Status 11/01/2020 FINAL  Final   Organism ID, Bacteria STENOTROPHOMONAS MALTOPHILIA  Final      Susceptibility   Stenotrophomonas maltophilia - MIC*    LEVOFLOXACIN 0.5 SENSITIVE Sensitive     TRIMETH/SULFA <=20 SENSITIVE Sensitive     * MODERATE STENOTROPHOMONAS MALTOPHILIA       Radiology Studies: DG CHEST PORT 1 VIEW  Result Date: 10/31/2020 CLINICAL DATA:  Hypoxia and sepsis. EXAM: PORTABLE CHEST 1 VIEW COMPARISON:  Chest x-ray 10/28/2020, chest x-ray 10/24/2020,  CT chest 10/29/2020, CT chest 11/06/2020 FINDINGS: Right PICC with tip overlying the expected region of the superior cavoatrial junction. Enteric tube coursing below the hemidiaphragm with tip collimated off view. The heart and mediastinal contours are unchanged. Slightly more prominent mass-like 2.6 cm opacity within the right lower lobe consistent with known infection. Left lower lobe retrocardiac opacity again noted with air bronchograms. No pulmonary edema. Persistent, possibly decreased in size, small left pleural effusion. Trace right pleural effusion. No pneumothorax. No acute osseous abnormality. IMPRESSION: Bilateral lower lobe airspace opacities with associated bilateral trace to small volume pleural effusions, left greater than right. Stable to possibly slightly improved left pleural effusion. Slightly increased right lower lobe airspace opacity. Followup PA and lateral chest X-ray is recommended in 3-4 weeks following therapy to ensure resolution and exclude underlying malignancy. Right PICC in Electronically Signed   By: Tish Frederickson M.D.   On: 10/31/2020 15:17    Scheduled Meds:  chlorhexidine  15 mL Mouth Rinse BID   Chlorhexidine Gluconate Cloth  6 each Topical Q0600   fluticasone furoate-vilanterol  1 puff Inhalation Daily   And   umeclidinium bromide  1 puff Inhalation Daily   furosemide  40 mg Intravenous BID   heparin  5,000 Units Subcutaneous Q8H   insulin aspart  0-20 Units Subcutaneous Q4H   mouth rinse  15 mL Mouth Rinse q12n4p   pantoprazole (PROTONIX) IV  40 mg Intravenous Q24H   sodium chloride flush  10-40 mL Intracatheter Q12H   sodium chloride flush  10-40 mL Intracatheter Q12H   Continuous Infusions:  sodium chloride Stopped (10/31/20 0131)   levofloxacin (LEVAQUIN) IV     metronidazole 500 mg (11/01/20 0856)   TPN ADULT (ION) 100 mL/hr at 11/01/20 0600   TPN ADULT (ION)       LOS: 11 days   Time spent: 29 minutes   Hughie Closs, MD [facility]  11/01/2020, 11:12 AM  Please page via Loretha Stapler and do not message via secure chat for anything urgent. Secure chat can be used for anything non urgent and I will respond at my earliest availability.  How to contact the [facility] Attending or Consulting provider 7A - 7P or covering provider during after hours 7P -7A, for this patient?  Check the care team in [facility] and look for a) attending/consulting [facility] provider listed and b) the [facility] team listed. Page or secure chat 7A-7P. Log into [email or web address] and use [place] universal password to access. If you do not have the password, please contact the hospital operator. Locate the [facility] provider you are looking for under [facility] and page to a number that you can be directly reached. If you still have difficulty reaching the provider, please page the [facility] (Director on Call) for the Hospitalists listed  on amion for assistance.

## 2020-11-01 NOTE — Procedures (Signed)
Cortrak  Person Inserting Tube:  Osa Craver, RD Tube Type:  Cortrak - 43 inches Tube Size:  10 Tube Location:  Right nare Initial Placement:  Postpyloric Secured by: Bridle Technique Used to Measure Tube Placement:  Marking at nare/corner of mouth Cortrak Secured At:  99 cm Procedure Comments:  Cortrak Tube Team Note:  Consult received to place a Cortrak feeding tube.   X-ray is required, abdominal x-ray has been ordered by the Cortrak team. Please confirm tube placement before using the Cortrak tube.   If the tube becomes dislodged please keep the tube and contact the Cortrak team at [email or web address] (password [email or web address]) for replacement.  If after hours and replacement cannot be delayed, place a NG tube and confirm placement with an abdominal x-ray.    Romelle Starcher MS, RDN, LDN, CNSC Registered Dietitian III Clinical Nutrition RD Pager and On-Call Pager Number Located in [place]

## 2020-11-02 ENCOUNTER — Inpatient Hospital Stay (HOSPITAL_COMMUNITY): Payer: BC Managed Care – PPO

## 2020-11-02 DIAGNOSIS — R6520 Severe sepsis without septic shock: Secondary | ICD-10-CM | POA: Diagnosis not present

## 2020-11-02 DIAGNOSIS — A419 Sepsis, unspecified organism: Secondary | ICD-10-CM | POA: Diagnosis not present

## 2020-11-02 LAB — CBC WITH DIFFERENTIAL/PLATELET
Abs Immature Granulocytes: 0.3 10*3/uL — ABNORMAL HIGH (ref 0.00–0.07)
Basophils Absolute: 0.2 10*3/uL — ABNORMAL HIGH (ref 0.0–0.1)
Basophils Relative: 2 %
Eosinophils Absolute: 0.3 10*3/uL (ref 0.0–0.5)
Eosinophils Relative: 3 %
HCT: 27.3 % — ABNORMAL LOW (ref 39.0–52.0)
Hemoglobin: 8.6 g/dL — ABNORMAL LOW (ref 13.0–17.0)
Lymphocytes Relative: 8 %
Lymphs Abs: 0.8 10*3/uL (ref 0.7–4.0)
MCH: 33.3 pg (ref 26.0–34.0)
MCHC: 31.5 g/dL (ref 30.0–36.0)
MCV: 105.8 fL — ABNORMAL HIGH (ref 80.0–100.0)
Monocytes Absolute: 0.1 10*3/uL (ref 0.1–1.0)
Monocytes Relative: 1 %
Neutro Abs: 7.8 10*3/uL — ABNORMAL HIGH (ref 1.7–7.7)
Neutrophils Relative %: 83 %
Platelets: 159 10*3/uL (ref 150–400)
Promyelocytes Relative: 3 %
RBC: 2.58 MIL/uL — ABNORMAL LOW (ref 4.22–5.81)
RDW: 17.9 % — ABNORMAL HIGH (ref 11.5–15.5)
WBC: 9.4 10*3/uL (ref 4.0–10.5)
nRBC: 0 /100 WBC
nRBC: 0.2 % (ref 0.0–0.2)

## 2020-11-02 LAB — GLUCOSE, CAPILLARY
Glucose-Capillary: 149 mg/dL — ABNORMAL HIGH (ref 70–99)
Glucose-Capillary: 154 mg/dL — ABNORMAL HIGH (ref 70–99)
Glucose-Capillary: 162 mg/dL — ABNORMAL HIGH (ref 70–99)
Glucose-Capillary: 204 mg/dL — ABNORMAL HIGH (ref 70–99)
Glucose-Capillary: 204 mg/dL — ABNORMAL HIGH (ref 70–99)
Glucose-Capillary: 220 mg/dL — ABNORMAL HIGH (ref 70–99)

## 2020-11-02 LAB — BASIC METABOLIC PANEL
Anion gap: 8 (ref 5–15)
BUN: 17 mg/dL (ref 6–20)
CO2: 27 mmol/L (ref 22–32)
Calcium: 8.6 mg/dL — ABNORMAL LOW (ref 8.9–10.3)
Chloride: 101 mmol/L (ref 98–111)
Creatinine, Ser: 0.61 mg/dL (ref 0.61–1.24)
GFR, Estimated: >60 mL/min (ref >60)
Glucose, Bld: 230 mg/dL — ABNORMAL HIGH (ref 70–99)
Potassium: 4 mmol/L (ref 3.5–5.1)
Sodium: 136 mmol/L (ref 135–145)

## 2020-11-02 MED ORDER — TRAVASOL 10 % IV SOLN
INTRAVENOUS | Status: AC
  Filled 2020-11-02: qty 1320

## 2020-11-02 MED ORDER — INSULIN ASPART 100 UNIT/ML IJ SOLN
3.0000 [IU] | INTRAMUSCULAR | Status: DC
  Administered 2020-11-02 – 2020-11-03 (×7): 3 [IU] via SUBCUTANEOUS

## 2020-11-02 NOTE — Progress Notes (Addendum)
PHARMACY - TOTAL PARENTERAL NUTRITION CONSULT NOTE  Indication:  severe pancreatitis  Patient Measurements: Height: 5\' 9"  (175.3 cm) Weight: 98 kg (216 lb 0.8 oz) IBW/kg (Calculated) : 70.7 TPN AdjBW (KG): 78 Body mass index is 31.91 kg/m.  Assessment:  Paul Chambers presenting 9/8 with septic shock due to necrotizing pancreatitis with pneumoperitoneum s/p ex-lap 9/9. Pt transferred to ICU post-op, intubated and sedated. Pharmacy consulted to manage TPN for severe pancreatitis. Pt at risk of refeeding with minimal PO intake x 1 week PTA, decreased PO intake x 1.5 months PTA with significant weight loss and hx heavy alcohol abuse.  Glucose / Insulin: A1c 6.8% (no meds pta). CBGs trended up with TF initiation Used 23 units SSI, 80 units insulin in TPN Electrolytes: 9/19 labs - all WNL except elevated CoCa at 10.7 (CL low and CO2 high normal, Phos low normal) Renal: AKI resolved - SCr 0.61, BUN wnl (lasix increased to 40mg  IV BID on 9/17) Hepatic: LFTs / tbili normalized, TG WNL, albumin 1.5 Intake / Output; MIVF: abd JP drains x 2 O/P , UOP 0.52ml/kg/hr, net +9.6L, LBM 9/17 GI Imaging: 9/8 CT A/P - extensive pneumoperitoneum 9/16 Repeat CT A/P - large amount of residual gas and fluid dissecting through retroperitoneum, no evidence of enteric fistula or leak  GI Surgeries / Procedures:  9/9 ex-lap (no colonic perforation, large necrotic fluid present surrounding pancreas)  Central access: CVC 10/22/20 TPN start date: 10/23/20  Nutritional Goals, RD Estimated Needs Total Energy Estimated Needs: 2350-2550 Total Protein Estimated Needs: 125-145 grams Total Fluid Estimated Needs: >/= 2.0 L  Current Nutrition:  TPN Vital 1.5 at 20 ml/hr = 720 kCal and 32g AA per day (goal rate 65 ml/hr)  Plan:  Continue TPN at goal rate of 100 ml/hr to provide 132g AA, 336g CHO and 72g ILE for a total of 2390 kCal, meeting 100% of patient's needs TPN and TF will provide 3310 kCal and 164g AA, exceeding  needs during titration Electrolytes in TPN: Na 30mEq/L, K 40 mEq/L, reduce Ca to 65mEq/L on 9/19, Mg 80mEq/L, increase Phos slightly to 74mmol/L on 9/19, change to max CL (~2:1 ratio) on 9/19 - no change on 9/20 Add standard MVI, trace elements, folic acid 1mg , and thiamine 100mg  to TPN Continue resistant SSI Q4H and 80 units regular insulin in TPN Add 3 units TF coverage Q4H, hold for CBG < 120 or if TF is off Standard TPN labs on Mon and Thurs, BMET in AM with increased Lasix dose Monitor volume status, TF advancement to start weaning TPN  Rossi Silvestro D. , PharmD, BCPS, BCCCP 11/02/2020, 10:17 AM  =================================  Addendum: AM labs checked/resulted later: all WNL, no change  Jadis Pitter D. Thu, PharmD, BCPS, BCCCP 11/02/2020, 2:20 PM

## 2020-11-02 NOTE — Progress Notes (Signed)
PA of General Surgery notified that patient tachycardic in 120s, tachypnea with shallow breathing, complaints of "rib" pain and distention pain. Patient burping but not nauseated.  See new orders. Tube feeds held at this time.

## 2020-11-02 NOTE — Progress Notes (Signed)
PROGRESS NOTE    Paul Chambers  OFB:[id number] DOB: 03-Nov-1965 DOA: 10/26/2020 PCP: Pcp, No   Brief Narrative:  This 55 y.o. Caucasian male smoker presented to the [facility] Emergency Department via private vehicle with complaints of abdominal pain and altered mental status. The patient's wife reports that this is the third presentation to a healthcare facility in the past month for abdominal pain ([facility], [facility] and now [facility]).  The patient's deteriorating mental status over the past 2 days prompted the wife to force the patient to come to the hospital.  In the ER, CT head was negative for acute process, but CT abdomen showed an impressive pneumoperitoneum without an obvious location of perforation.  Surgery evaluated the patient and decided for exploratory laparotomy which was performed on 10/22/2020.  Sequence of events as below. Significant Hospital Events: Including procedures, antibiotic start and stop dates in addition to other pertinent events   9/8 - Presented to [facility] with abdominal pain. CT A/P with extensive pneumoperitoneum 9/9 - Ex-lap (no colonic perforation discovered, large necrotic fluid present surrounding pancreas. Transferred to ICU post-op, intubated and sedated.  9/10-received 1 unit PRBC for hemoglobin 6.7, went back on Levophed for short period of time 9/12 - Extubated to Venturi mask 9/13 - Off Fentanyl and Propofol gtt 10/28/20.  Patient transferred under TRH.  Assessment & Plan:   Principal Problem:   Severe sepsis (HCC) Active Problems:   Acute necrotizing pancreatitis   Toxic encephalopathy   Hyponatremia   Hypoalbuminemia   Macrocytic anemia   Pressure injury of skin   Protein-calorie malnutrition, severe   Infected Necrotizing Pancreatitis with Pneumoperitoneum s/p ex-lap by Dr. Drexel Iha on 10/22/2020: - General surgery following; appreciate their recommendations.  - Continue IV antibiotics with Zosyn and Fluconazole, 7  day planned - Discontinued Linezolid, as he has completed course.  - Continue TPN.  He pulled out his NG tube on 10/27/2020. Continues to have fever, had 101.2 fever around 4 PM last night and 100.8 this morning.  CT abdomen repeated 10/29/2020 by general surgery shows large amount of residual gas and fluid dissecting through the retroperitoneum, no evidence of enteric fistula or leak.  Still has intermittent tachycardia and tachypnea. Patient remains on antibiotics and supportive therapy, management per general surgery.   Acute respiratory failure with hypoxia/possible aspiration pneumonia/congestive heart failure/left pleural effusion: Intubated 10/22/2020 and extubated 10/26/2020.   Repeat chest x-ray 10/28/2020 shows worsening/increased left lung opacity concerning for pneumonia or atelectasis and associated left pleural effusion.  Antibiotics transition to Merrem.  Repeat procalcitonin improving.  Patient's respiratory status and lung exam is also improving while he is on Lasix.  However repeat chest x-ray on 10/31/2020 shows improved left pleural effusion but increased right lower base opacity, recommendation to repeat chest x-ray in 3 to 4 weeks.  Patient grew stenotrophomonas maltophilia in the sputum culture, antibiotics transitioned to Levaquin and Flagyl on 11/01/2020 per pharmacy recommendations.  Respiratory status improved significantly but is still having low-grade fever which is also improving, currently on room air.  We will continue IV Lasix twice daily for today and likely reduce dose tomorrow.   Acute toxic-metabolic encephalopathy: Sepsis +/- ICU delirium: He is more alert and oriented today.  Maintain delirium precautions.  Ileus: Resolving.  Managed by general surgery.   Acute hepatitis: Stable. 2/2 to TPN - CMP daily    Alcohol abuse: Out of the window for withdrawal complications.   - Continue thiamine and folate   Acute Blood Loss Anemia,  postsurgical: S/p 1 unit of pRBCs on 9/10  with stabilization in hemoglobin since.  - Monitor H&H and transfuse if less than 7   Severe protein calorie malnutrition - Continue TPN   Diabetes type 2 with hyperglycemia: Hemoglobin A1c was 6.8% on admission.  Blood sugar elevated.  Hyperglycemia managed by pharmacy with insulin in TPN.   Hypokalemia: Resolved.  Hypophosphatemia: Replenished.  Dysphagia: Assessed by SLP.  Per SLP, patient is cleared to swallow but general surgery plans to keep him n.p.o. with jejunal tube feeds for now.   DVT prophylaxis: heparin injection 5,000 Units Start: 10/22/20 1400 Place and maintain sequential compression device Start: 11/10/2020 2202 SCDs Start: 10/15/2020 2145   Code Status: Full Code  Family Communication: Sister present at bedside.   Status is: Inpatient  Remains inpatient appropriate because:Inpatient level of care appropriate due to severity of illness  Dispo: The patient is from: Home              Anticipated d/c is to: CIR              Patient currently is not medically stable to d/c.   Difficult to place patient No        Estimated body mass index is 31.91 kg/m as calculated from the following:   Height as of this encounter: 5\' 9"  (1.753 m).   Weight as of this encounter: 98 kg.     Nutritional Assessment: Body mass index is 31.91 kg/m. Seen by dietician.  I agree with the assessment and plan as outlined below: Nutrition Status: Nutrition Problem: Severe Malnutrition Etiology: acute illness (infected necrotic pancreatitis) Signs/Symptoms: moderate fat depletion, moderate muscle depletion, energy intake < or equal to 50% for > or equal to 5 days, percent weight loss (8.4% weight loss in 1.5 months) Percent weight loss: 8.4 % (1.5 months) Interventions: Refer to RD note for recommendations  .  Skin Assessment: I have examined the patient's skin and I agree with the wound assessment as performed by the wound care RN as outlined below:    Consultants:  General  surgery  Procedures:  As above  Antimicrobials:  Anti-infectives (From admission, onward)    Start     Dose/Rate Route Frequency Ordered Stop   11/01/20 0900  levofloxacin (LEVAQUIN) IVPB 750 mg        750 mg 100 mL/hr over 90 Minutes Intravenous Every 24 hours 11/01/20 0824     11/01/20 0900  metroNIDAZOLE (FLAGYL) IVPB 500 mg        500 mg 100 mL/hr over 60 Minutes Intravenous Every 12 hours 11/01/20 0824     10/29/20 2200  meropenem (MERREM) 1 g in sodium chloride 0.9 % 100 mL IVPB  Status:  Discontinued        1 g 200 mL/hr over 30 Minutes Intravenous Every 8 hours 10/29/20 1428 11/01/20 0824   10/28/20 1615  meropenem (MERREM) 2 g in sodium chloride 0.9 % 100 mL IVPB  Status:  Discontinued        2 g 200 mL/hr over 30 Minutes Intravenous Every 8 hours 10/28/20 1515 10/29/20 1428   10/28/20 1445  metroNIDAZOLE (FLAGYL) IVPB 500 mg  Status:  Discontinued        500 mg 100 mL/hr over 60 Minutes Intravenous Every 12 hours 10/28/20 1348 10/28/20 1505   10/25/20 2300  fluconazole (DIFLUCAN) IVPB 400 mg        400 mg 100 mL/hr over 120 Minutes Intravenous Every 24 hours 10/25/20 0742  10/29/20 0718   10/24/20 2300  fluconazole (DIFLUCAN) IVPB 200 mg  Status:  Discontinued        200 mg 100 mL/hr over 60 Minutes Intravenous Every 24 hours 10/24/20 0943 10/25/20 0742   10/22/20 1800  vancomycin (VANCOREADY) IVPB 1500 mg/300 mL  Status:  Discontinued        1,500 mg 150 mL/hr over 120 Minutes Intravenous Every 24 hours 11-10-2020 2225 10/22/20 0759   10/22/20 0900  linezolid (ZYVOX) IVPB 600 mg  Status:  Discontinued        600 mg 300 mL/hr over 60 Minutes Intravenous Every 12 hours 10/22/20 0800 10/27/20 0852   10/22/20 0300  piperacillin-tazobactam (ZOSYN) IVPB 3.375 g  Status:  Discontinued        3.375 g 12.5 mL/hr over 240 Minutes Intravenous Every 8 hours 11/10/20 2225 10/28/20 1514   11-10-2020 2335  metroNIDAZOLE (FLAGYL) IVPB 500 mg  Status:  Discontinued        500 mg 100  mL/hr over 60 Minutes Intravenous Every 12 hours November 10, 2020 2142 10/22/20 0747   11-10-20 2248  fluconazole (DIFLUCAN) IVPB 400 mg  Status:  Discontinued        400 mg 100 mL/hr over 120 Minutes Intravenous Every 24 hours Nov 10, 2020 2142 10/24/20 0943   11-10-2020 1748  vancomycin (VANCOREADY) IVPB 2000 mg/400 mL        2,000 mg 200 mL/hr over 120 Minutes Intravenous  Once Nov 10, 2020 1706 11-10-2020 2113   Nov 10, 2020 1715  piperacillin-tazobactam (ZOSYN) IVPB 3.375 g        3.375 g 100 mL/hr over 30 Minutes Intravenous  Once 11/10/2020 1706 2020/11/10 1903          Subjective: Patient seen and examined.  Sister at the bedside.  Patient sitting in the chair for the first time.  Fully alert and oriented.  Denies any abdominal pain, shortness of breath or any other complaint.  Asking appropriate questions and engaging in reasonable conversation today  Objective: Vitals:   11/02/20 0800 11/02/20 0806 11/02/20 0900 11/02/20 0942  BP:   130/79   Pulse: 95  95 (!) 102  Resp: 20  20 (!) 26  Temp:      TempSrc:      SpO2: 92% 94% 92% 95%  Weight:      Height:        Intake/Output Summary (Last 24 hours) at 11/02/2020 1112 Last data filed at 11/02/2020 0951 Gross per 24 hour  Intake 2372.52 ml  Output 2258 ml  Net 114.52 ml    Filed Weights   10/31/20 0350 11/01/20 0500 11/02/20 0444  Weight: 98.2 kg 97.9 kg 98 kg    Examination:  General exam: Appears calm and comfortable, obese Respiratory system: Clear to auscultation. Respiratory effort normal. Cardiovascular system: S1 & S2 heard, RRR. No JVD, murmurs, rubs, gallops or clicks. No pedal edema. Gastrointestinal system: Abdomen is moderately distended, soft and nontender. No organomegaly or masses felt. Normal bowel sounds heard. Central nervous system: Alert and oriented. No focal neurological deficits. Extremities: Symmetric 5 x 5 power. Skin: No rashes, lesions or ulcers.  Psychiatry: Judgement and insight appear normal. Mood & affect  appropriate.   Data Reviewed: I have personally reviewed following labs and imaging studies  CBC: Recent Labs  Lab 10/28/20 0212 10/29/20 1008 10/30/20 0328 10/31/20 0352 11/02/20 0925  WBC 16.7* 12.4* 9.8 11.1* 9.4  NEUTROABS 15.2* 10.3* 7.3 8.0* 7.8*  HGB 8.6* 8.6* 8.3* 8.4* 8.6*  HCT 27.3* 26.8* 26.9*  27.0* 27.3*  MCV 106.2* 105.9* 119.6* 107.6* 105.8*  PLT 140* 114* 104* 112* 159    Basic Metabolic Panel: Recent Labs  Lab 10/27/20 0500 10/28/20 0212 10/29/20 0630 10/30/20 0503 10/31/20 0352 11/01/20 0358 11/02/20 0925  NA 138 143 143 143 144 142 136  K 3.3* 3.5 3.7 3.9 4.2 4.2 4.0  CL 104 108 108 107 107 99 101  CO2 GLUCOSE 252* 154* 177* 174* 129* 212* 230*  BUN 22* CREATININE 0.87 0.69 0.57* 0.52* 0.61 0.59* 0.61  CALCIUM 8.3* 8.7* 8.7* 8.6* 8.6* 8.7* 8.6*  MG 2.1 2.0  --   --   --  2.0  --   PHOS 2.6 2.1*  --  2.6  --  2.9  --     GFR: Estimated Creatinine Clearance: 124.7 mL/min (by C-G formula based on SCr of 0.61 mg/dL). Liver Function Tests: Recent Labs  Lab 10/28/20 0212 10/29/20 0630 10/30/20 0503 10/31/20 0352 11/01/20 0358  AST 35 33  ALT ALKPHOS 141* 147* 119 94 89  BILITOT 0.9 0.6 0.8 0.8 0.7  PROT 5.3* 5.6* 5.4* 5.6* 5.7*  ALBUMIN 1.5* 1.5* 1.5* 1.5* 1.5*    No results for input(s): LIPASE, AMYLASE in the last 168 hours.  No results for input(s): AMMONIA in the last 168 hours.  Coagulation Profile: No results for input(s): INR, PROTIME in the last 168 hours.  Cardiac Enzymes: No results for input(s): CKTOTAL, CKMB, CKMBINDEX, TROPONINI in the last 168 hours.  BNP (last 3 results) No results for input(s): PROBNP in the last 8760 hours. HbA1C: No results for input(s): HGBA1C in the last 72 hours. CBG: Recent Labs  Lab 11/01/20 1533 11/01/20 1937 11/01/20 2305 11/02/20 0418 11/02/20 0735  GLUCAP 169* 186* 140* 204* 204*    Lipid Profile: Recent Labs     11/01/20 0358  TRIG 68    Thyroid Function Tests: No results for input(s): TSH, T4TOTAL, FREET4, T3FREE, THYROIDAB in the last 72 hours. Anemia Panel: No results for input(s): VITAMINB12, FOLATE, FERRITIN, TIBC, IRON, RETICCTPCT in the last 72 hours. Sepsis Labs: Recent Labs  Lab 10/28/20 1347 10/31/20 0937  PROCALCITON 2.38 1.12     Recent Results (from the past 240 hour(s))  Expectorated Sputum Assessment w Gram Stain, Rflx to Resp Cult     Status: None   Collection Time: 10/28/20  2:57 PM   Specimen: Expectorated Sputum  Result Value Ref Range Status   Specimen Description EXPECTORATED SPUTUM  Final   Special Requests NONE  Final   Sputum evaluation   Final    THIS SPECIMEN IS ACCEPTABLE FOR SPUTUM CULTURE Performed at [facility], [address] [place], [place] [address]    Report Status 10/30/2020 FINAL  Final  Culture, Respiratory w Gram Stain     Status: None   Collection Time: 10/28/20  2:57 PM  Result Value Ref Range Status   Specimen Description EXPECTORATED SPUTUM  Final   Special Requests NONE Reflexed from [id number]  Final   Gram Stain   Final    FEW SQUAMOUS EPITHELIAL CELLS PRESENT MODERATE WBC PRESENT,BOTH PMN AND MONONUCLEAR ABUNDANT GRAM NEGATIVE RODS Performed at [facility], [address], [place], [place] [address]    Culture MODERATE STENOTROPHOMONAS MALTOPHILIA  Final   Report Status 11/01/2020 FINAL  Final   Organism ID, Bacteria STENOTROPHOMONAS MALTOPHILIA  Final  Susceptibility   Stenotrophomonas maltophilia - MIC*    LEVOFLOXACIN 0.5 SENSITIVE Sensitive     TRIMETH/SULFA <=20 SENSITIVE Sensitive     * MODERATE STENOTROPHOMONAS MALTOPHILIA       Radiology Studies: DG CHEST PORT 1 VIEW  Result Date: 10/31/2020 CLINICAL DATA:  Hypoxia and sepsis. EXAM: PORTABLE CHEST 1 VIEW COMPARISON:  Chest x-ray 10/28/2020, chest x-ray 11/12/2020, CT chest 10/29/2020, CT chest 11/02/2020 FINDINGS: Right PICC with tip overlying the  expected region of the superior cavoatrial junction. Enteric tube coursing below the hemidiaphragm with tip collimated off view. The heart and mediastinal contours are unchanged. Slightly more prominent mass-like 2.6 cm opacity within the right lower lobe consistent with known infection. Left lower lobe retrocardiac opacity again noted with air bronchograms. No pulmonary edema. Persistent, possibly decreased in size, small left pleural effusion. Trace right pleural effusion. No pneumothorax. No acute osseous abnormality. IMPRESSION: Bilateral lower lobe airspace opacities with associated bilateral trace to small volume pleural effusions, left greater than right. Stable to possibly slightly improved left pleural effusion. Slightly increased right lower lobe airspace opacity. Followup PA and lateral chest X-ray is recommended in 3-4 weeks following therapy to ensure resolution and exclude underlying malignancy. Right PICC in Electronically Signed   By: Tish Frederickson M.D.   On: 10/31/2020 15:17   DG Abd Portable 1V  Result Date: 11/01/2020 CLINICAL DATA:  Check feeding catheter placement EXAM: PORTABLE ABDOMEN - 1 VIEW COMPARISON:  10/29/2020 FINDINGS: Feeding catheter is noted extending into the fourth portion of the duodenum/proximal jejunum. Surgical drains are noted in place IMPRESSION: Feeding catheter in the distal duodenum/proximal jejunum. Electronically Signed   By: Alcide Clever M.D.   On: 11/01/2020 15:49    Scheduled Meds:  chlorhexidine  15 mL Mouth Rinse BID   Chlorhexidine Gluconate Cloth  6 each Topical Q0600   fluticasone furoate-vilanterol  1 puff Inhalation Daily   And   umeclidinium bromide  1 puff Inhalation Daily   furosemide  40 mg Intravenous BID   heparin  5,000 Units Subcutaneous Q8H   insulin aspart  0-20 Units Subcutaneous Q4H   insulin aspart  3 Units Subcutaneous Q4H   mouth rinse  15 mL Mouth Rinse q12n4p   pantoprazole (PROTONIX) IV  40 mg Intravenous Q24H   sodium  chloride flush  10-40 mL Intracatheter Q12H   sodium chloride flush  10-40 mL Intracatheter Q12H   Continuous Infusions:  sodium chloride Stopped (10/31/20 0131)   feeding supplement (VITAL 1.5 CAL) 1,000 mL (11/02/20 0128)   levofloxacin (LEVAQUIN) IV 750 mg (11/02/20 0951)   metronidazole Stopped (11/01/20 2225)   TPN ADULT (ION) 100 mL/hr at 11/02/20 0600   TPN ADULT (ION)       LOS: 12 days   Time spent: 29 minutes   Hughie Closs, MD [facility]  11/02/2020, 11:12 AM  Please page via [facility] and do not message via secure chat for anything urgent. Secure chat can be used for anything non urgent and I will respond at my earliest availability.  How to contact the [facility] Attending or Consulting provider 7A - 7P or covering provider during after hours 7P -7A, for this patient?  Check the care team in [facility] and look for a) attending/consulting [facility] provider listed and b) the [facility] team listed. Page or secure chat 7A-7P. Log into [email or web address] and use [facility] universal password to access. If you do not have the password, please contact the hospital operator. Locate the [facility] provider you are looking  for under [facility] and page to a number that you can be directly reached. If you still have difficulty reaching the provider, please page the [facility] (Director on Call) for the Hospitalists listed on amion for assistance.

## 2020-11-02 NOTE — Progress Notes (Addendum)
General Surgery, F.Byerly, paged. Notified MD that patient has had x1 moderate yellow emesis, tube feeds on hold.

## 2020-11-02 NOTE — Progress Notes (Signed)
Nutrition Follow-up  DOCUMENTATION CODES:   Severe malnutrition in context of acute illness/injury  INTERVENTION:   - Continue TPN management per Pharmacy  Continue trickle tube feeds via post-pyloric Cortrak tube: - Vital 1.5 @ 20 ml/hr (480 ml/day)   RD will monitor for ability to advance tube feeds to goal: - Recommend advancing Vital 1.5 by 10 ml q 8 hours to goal rate of 65 ml/hr (1560 ml/day) - ProSource TF 45 ml TID  Recommended tube feeding regimen at goal would provide 2460 kcal, 138 grams of protein, and 1192 ml of H2O.  NUTRITION DIAGNOSIS:   Severe Malnutrition related to acute illness (infected necrotic pancreatitis) as evidenced by moderate fat depletion, moderate muscle depletion, energy intake < or equal to 50% for > or equal to 5 days, percent weight loss (8.4% weight loss in 1.5 months).  Ongoing, being addressed via TPN and trickle TF  GOAL:   Patient will meet greater than or equal to 90% of their needs  Met via TPN  MONITOR:   Diet advancement, Labs, Weight trends, TF tolerance, Skin, I & O's, Other (TPN)  REASON FOR ASSESSMENT:   Ventilator    ASSESSMENT:   55 year old male who presented to the ED on 9/08 with AMS and abdominal pain. Pt seen at [facility] 1 week PTA and was diagnosed with AKI and pancreatitis. PMH of HTN, gout, tobacco abuse, EtOH abuse. Pt admitted with sepsis secondary to infected necrotic pancreatitis.  9/09 - s/p ex-lap with pancreatic debridement for infected necrotic pancreatitis 9/10 - TPN initiated 9/12 - TPN increased to goal rate, extubated 9/14 - NGT clamping trials, NGT removed by pt 9/15 - SLP evaluation with recommendations for NPO except ice chips 9/16 - Cortrak placed for administration of contrast for CT (tip gastric) 9/19 - Cortrak tube advanced post-pyloric, trickle TF initiated  Per Surgery note, continue tube feeds at trickle rate today and continue TPN at goal rate today. RD will monitor for ability to  advance tube feeds to goal tomorrow.  Admit weight: 93.7 kg Current weight: 98 kg  Pt continues to have moderate pitting edema to BUE and BLE.  Current TF: Vital 1.5 @ 20 ml/hr which provides 720 kcal and 32 grams of protein daily  Current TPN: 100 ml/hr which provides 2390 kcal and 132 grams of protein (meets 100% of estimated needs)  Medications reviewed and include: IV lasix, SSI q 4 hours, novolog 3 units q 4 hours, IV protonix, IV abx, TPN  Labs reviewed: hemoglobin 8.6 CBG's: 140-204 x 24 hours  UOP: 2285 ml x 24 hours R abd JP drain 1: 45 ml x 24 hours R abd JP drain 2: 178 ml x 24 hours I/O's: +9.6 L since admit  Diet Order:   Diet Order             Diet NPO time specified Except for: Ice Chips  Diet effective now                   EDUCATION NEEDS:   No education needs have been identified at this time  Skin:  Skin Assessment: Skin Integrity Issues: Stage I: coccyx Incisions: abdomen  Last BM:  10/31/20 type 7  Height:   Ht Readings from Last 1 Encounters:  11/11/2020 5' 9"  (1.753 m)    Weight:   Wt Readings from Last 1 Encounters:  11/02/20 98 kg    BMI:  Body mass index is 31.91 kg/m.  Estimated Nutritional Needs:   Kcal:  2641-5830  Protein:  125-145 grams  Fluid:  >/= 2.0 L    Gustavus Bryant, MS, RD, LDN Inpatient Clinical Dietitian Please see AMiON for contact information.

## 2020-11-02 NOTE — Progress Notes (Signed)
Inpatient Rehab Admissions Coordinator:   Pt. Continues on TPN, not ready for CIR at this time. I will continue to follow for potential admit pending medical readiness, insurance auth, and bed availability.   Megan Salon, MS, CCC-SLP Rehab Admissions Coordinator  [phone] (celll) [phone] (office)

## 2020-11-02 NOTE — Progress Notes (Signed)
12 Days Post-Op  Subjective: CC: Patient reports stable generalized abdominal pain. On tf's at 77ml/hr without n/v. Denies flatus. Last bm 9/17. Up in the chair this morning. Has external cath and voiding.   Objective: Vital signs in last 24 hours: Temp:  [99.7 F (37.6 C)-101.7 F (38.7 C)] 100.1 F (37.8 C) (09/20 0737) Pulse Rate:  [95-126] 102 (09/20 0942) Resp:  [17-26] 26 (09/20 0942) BP: (108-146)/(75-87) 130/79 (09/20 0900) SpO2:  [90 %-97 %] 95 % (09/20 0942) Weight:  [98 kg] 98 kg (09/20 0444) Last BM Date: 10/30/20  Intake/Output from previous day: 09/19 0701 - 09/20 0700 In: 2782.5 [I.V.:2312.3; NG/GT:100; IV Piggyback:370.2] Out: 2508 [Urine:2285; Drains:223] Intake/Output this shift: Total I/O In: -  Out: 300 [Urine:300]  PE: Gen:  Awake and alert, sitting up in chair in nad Heart: Tachycardic  Pulm:  Normal rate and effort  Abd: Distended but soft, generalized tenderness without rigidity or guarding. RUQ drains x2 with brown fluid in bulbs. Midline wound pale pink with early granulation, and clean without dehiscence.     Lab Results:  Recent Labs    10/31/20 0352  WBC 11.1*  HGB 8.4*  HCT 27.0*  PLT 112*   BMET Recent Labs    10/31/20 0352 11/01/20 0358  NA 144 142  K 4.2 4.2  CL 107 99  CO2 30 29  GLUCOSE 129* 212*  BUN 16 17  CREATININE 0.61 0.59*  CALCIUM 8.6* 8.7*   PT/INR No results for input(s): LABPROT, INR in the last 72 hours. CMP     Component Value Date/Time   NA 142 11/01/2020 0358   K 4.2 11/01/2020 0358   CL 99 11/01/2020 0358   CO2 29 11/01/2020 0358   GLUCOSE 212 (H) 11/01/2020 0358   BUN 17 11/01/2020 0358   CREATININE 0.59 (L) 11/01/2020 0358   CALCIUM 8.7 (L) 11/01/2020 0358   PROT 5.7 (L) 11/01/2020 0358   ALBUMIN 1.5 (L) 11/01/2020 0358   AST 33 11/01/2020 0358   ALT 24 11/01/2020 0358   ALKPHOS 89 11/01/2020 0358   BILITOT 0.7 11/01/2020 0358   GFRNONAA >60 11/01/2020 0358   Lipase      Component Value Date/Time   LIPASE 25 10/29/2020 1506    Studies/Results: DG CHEST PORT 1 VIEW  Result Date: 10/31/2020 CLINICAL DATA:  Hypoxia and sepsis. EXAM: PORTABLE CHEST 1 VIEW COMPARISON:  Chest x-ray 10/28/2020, chest x-ray 10/23/2020, CT chest 10/29/2020, CT chest 10/31/2020 FINDINGS: Right PICC with tip overlying the expected region of the superior cavoatrial junction. Enteric tube coursing below the hemidiaphragm with tip collimated off view. The heart and mediastinal contours are unchanged. Slightly more prominent mass-like 2.6 cm opacity within the right lower lobe consistent with known infection. Left lower lobe retrocardiac opacity again noted with air bronchograms. No pulmonary edema. Persistent, possibly decreased in size, small left pleural effusion. Trace right pleural effusion. No pneumothorax. No acute osseous abnormality. IMPRESSION: Bilateral lower lobe airspace opacities with associated bilateral trace to small volume pleural effusions, left greater than right. Stable to possibly slightly improved left pleural effusion. Slightly increased right lower lobe airspace opacity. Followup PA and lateral chest X-ray is recommended in 3-4 weeks following therapy to ensure resolution and exclude underlying malignancy. Right PICC in Electronically Signed   By: Tish Frederickson M.D.   On: 10/31/2020 15:17   DG Abd Portable 1V  Result Date: 11/01/2020 CLINICAL DATA:  Check feeding catheter placement EXAM: PORTABLE ABDOMEN - 1 VIEW COMPARISON:  10/29/2020 FINDINGS: Feeding catheter is noted extending into the fourth portion of the duodenum/proximal jejunum. Surgical drains are noted in place IMPRESSION: Feeding catheter in the distal duodenum/proximal jejunum. Electronically Signed   By: Alcide Clever M.D.   On: 11/01/2020 15:49    Anti-infectives: Anti-infectives (From admission, onward)    Start     Dose/Rate Route Frequency Ordered Stop   11/01/20 0900  levofloxacin (LEVAQUIN) IVPB 750  mg        750 mg 100 mL/hr over 90 Minutes Intravenous Every 24 hours 11/01/20 0824     11/01/20 0900  metroNIDAZOLE (FLAGYL) IVPB 500 mg        500 mg 100 mL/hr over 60 Minutes Intravenous Every 12 hours 11/01/20 0824     10/29/20 2200  meropenem (MERREM) 1 g in sodium chloride 0.9 % 100 mL IVPB  Status:  Discontinued        1 g 200 mL/hr over 30 Minutes Intravenous Every 8 hours 10/29/20 1428 11/01/20 0824   10/28/20 1615  meropenem (MERREM) 2 g in sodium chloride 0.9 % 100 mL IVPB  Status:  Discontinued        2 g 200 mL/hr over 30 Minutes Intravenous Every 8 hours 10/28/20 1515 10/29/20 1428   10/28/20 1445  metroNIDAZOLE (FLAGYL) IVPB 500 mg  Status:  Discontinued        500 mg 100 mL/hr over 60 Minutes Intravenous Every 12 hours 10/28/20 1348 10/28/20 1505   10/25/20 2300  fluconazole (DIFLUCAN) IVPB 400 mg        400 mg 100 mL/hr over 120 Minutes Intravenous Every 24 hours 10/25/20 0742 10/29/20 0718   10/24/20 2300  fluconazole (DIFLUCAN) IVPB 200 mg  Status:  Discontinued        200 mg 100 mL/hr over 60 Minutes Intravenous Every 24 hours 10/24/20 0943 10/25/20 0742   10/22/20 1800  vancomycin (VANCOREADY) IVPB 1500 mg/300 mL  Status:  Discontinued        1,500 mg 150 mL/hr over 120 Minutes Intravenous Every 24 hours 10/18/2020 2225 10/22/20 0759   10/22/20 0900  linezolid (ZYVOX) IVPB 600 mg  Status:  Discontinued        600 mg 300 mL/hr over 60 Minutes Intravenous Every 12 hours 10/22/20 0800 10/27/20 0852   10/22/20 0300  piperacillin-tazobactam (ZOSYN) IVPB 3.375 g  Status:  Discontinued        3.375 g 12.5 mL/hr over 240 Minutes Intravenous Every 8 hours 10/31/2020 2225 10/28/20 1514   11/02/2020 2335  metroNIDAZOLE (FLAGYL) IVPB 500 mg  Status:  Discontinued        500 mg 100 mL/hr over 60 Minutes Intravenous Every 12 hours 10/28/2020 2142 10/22/20 0747   11/03/2020 2248  fluconazole (DIFLUCAN) IVPB 400 mg  Status:  Discontinued        400 mg 100 mL/hr over 120 Minutes  Intravenous Every 24 hours 10/16/2020 2142 10/24/20 0943   10/25/2020 1748  vancomycin (VANCOREADY) IVPB 2000 mg/400 mL        2,000 mg 200 mL/hr over 120 Minutes Intravenous  Once 11/07/2020 1706 10/29/2020 2113   10/27/2020 1715  piperacillin-tazobactam (ZOSYN) IVPB 3.375 g        3.375 g 100 mL/hr over 30 Minutes Intravenous  Once 11/11/2020 1706 10/23/2020 1903        Assessment/Plan POD 11 s/p ex lap with pancreatic debridement for infected necrotic pancreatitis by Dr. Sheliah Hatch on 9/9 - Superior drain under the mesocolon along with the head of  the pancreas - Inferior drain under the mesocolon along the left upper abdomen and what is likely a position inferior to the pancreas - Continue drains and monitor - Keep post pyloric TF's at current rate along with TPN. Consider increasing to 1/2 goal rate TF's and decreasing TPN to 1/2 tomorrow.  - Per speech note, patient would be ready for swallow eval when cleared by our team. Will discuss with attending.  - Febrile w/ leukocytosis in the setting of + resp cx's (stenotrophomonas maltophilia). On abx.  -CT 9/17 demonstrates large amount of residual gas and fluid dissecting through the retroperitoneum, no evidence of enteric fistula or leak. Low threshold to obtain repeat CT scan  - BID WTD - Cont therapies, rec CIR who is following - Pulm toilet - Appreciate TRH's assistance w/ his care   FEN: TPN, TF's at 51ml/hr (goal 81ml/hr).  ID: Levoquin/Flagyl VTE: SCDs, heparin subq Foley - out, ext cath, voiding    - Per primary -  VDRF - now extubated PNA w/ + resp cx's - cx's w/ stenotrophomonas maltophilia - on abx  Abl anemia - s/p 1U PRBC 9/13 w/ appropriate response. Hgb pending this am ETOH use - was weaned off precedex after extubation. Consider CIWA AKI - resolved, good uop T2DM   LOS: 12 days    Jacinto Halim , [facility] 11/02/2020, 10:13 AM Please see Amion for pager number during day hours 7:00am-4:30pm

## 2020-11-02 NOTE — Plan of Care (Signed)

## 2020-11-03 DIAGNOSIS — A419 Sepsis, unspecified organism: Secondary | ICD-10-CM | POA: Diagnosis not present

## 2020-11-03 DIAGNOSIS — R6520 Severe sepsis without septic shock: Secondary | ICD-10-CM | POA: Diagnosis not present

## 2020-11-03 LAB — GLUCOSE, CAPILLARY
Glucose-Capillary: 130 mg/dL — ABNORMAL HIGH (ref 70–99)
Glucose-Capillary: 149 mg/dL — ABNORMAL HIGH (ref 70–99)
Glucose-Capillary: 183 mg/dL — ABNORMAL HIGH (ref 70–99)
Glucose-Capillary: 193 mg/dL — ABNORMAL HIGH (ref 70–99)
Glucose-Capillary: 220 mg/dL — ABNORMAL HIGH (ref 70–99)
Glucose-Capillary: 232 mg/dL — ABNORMAL HIGH (ref 70–99)

## 2020-11-03 MED ORDER — FAT EMUL FISH OIL/PLANT BASED 20% (SMOFLIPID)IV EMUL
INTRAVENOUS | Status: AC
  Filled 2020-11-03: qty 1320

## 2020-11-03 NOTE — Progress Notes (Signed)
Inpatient Rehab Admissions Coordinator:    I do not have a CIR bed for this pt. Today. Not medically ready. Pt. And family updated. I will contact the MD to discuss long term plans for nutrition/hydration for this Pt.  Megan Salon, MS, CCC-SLP Rehab Admissions Coordinator  [phone] (celll) [phone] (office)

## 2020-11-03 NOTE — Progress Notes (Signed)
Physical Therapy Treatment Patient Details Name: Paul Chambers MRN: [id number] DOB: 16-Oct-1965 Today's Date: 11/03/2020   History of Present Illness Pt adm 9/8 with abdominal pain and AMS. Pt found to have extensive pneumoperitoneum and underwent ex lap on 9/9. Pt with infected necrotizing pancreatitis. Pt with acute hypoxic respiratory failure. Pt intubated 9/9-9/12. PMH - etoh, HTN, gout, pancreatitis.    PT Comments    Pt progressing slowly, but steadily toward goals, limited by pain.  Emphasis on transition to EOB, scooting, balance, sit to stands and progression of gait.    Recommendations for follow up therapy are one component of a multi-disciplinary discharge planning process, led by the attending physician.  Recommendations may be updated based on patient status, additional functional criteria and insurance authorization.  Follow Up Recommendations  CIR     Equipment Recommendations  Rolling walker with 5" wheels    Recommendations for Other Services       Precautions / Restrictions Precautions Precautions: Fall Precaution Comments: JP drains out of ostomy bag     Mobility  Bed Mobility Overal bed mobility: Needs Assistance Bed Mobility: Supine to Sit     Supine to sit: Min assist     General bed mobility comments: With [facility] raised, pt came forward up via L UE with pt pulling on therapist's available hand.  Pt able to weakly scoot to EOB    Transfers Overall transfer level: Needs assistance   Transfers: Sit to/from Stand Sit to Stand: Min assist         General transfer comment: cues for hand placement,  assist forward with minor boosting  Ambulation/Gait Ambulation/Gait assistance: Min guard;+2 safety/equipment Gait Distance (Feet): 35 Feet Assistive device: IV Pole Gait Pattern/deviations: Step-through pattern;Decreased stride length;Trunk flexed   Gait velocity interpretation: <1.8 ft/sec, indicate of risk for recurrent falls General Gait  Details: guarded, flexed posture, moderate use of the IV pole,   Stairs             Wheelchair Mobility    Modified Rankin (Stroke Patients Only)       Balance Overall balance assessment: Needs assistance   Sitting balance-Leahy Scale: Fair     Standing balance support: Bilateral upper extremity supported;During functional activity Standing balance-Leahy Scale: Poor Standing balance comment: Reliant on BUE or AD                            Cognition Arousal/Alertness: Awake/alert Behavior During Therapy: Flat affect Overall Cognitive Status: Impaired/Different from baseline Area of Impairment: Following commands;Problem solving                       Following Commands: Follows one step commands with increased time     Problem Solving: Slow processing;Difficulty sequencing;Requires verbal cues;Requires tactile cues        Exercises      General Comments General comments (skin integrity, edema, etc.): vss on RA. sats low to mid 90's      Pertinent Vitals/Pain Pain Assessment: Faces Faces Pain Scale: Hurts little more Pain Location: abdomen Pain Descriptors / Indicators: Sore;Discomfort Pain Intervention(s): Monitored during session    Home Living                      Prior Function            PT Goals (current goals can now be found in the care plan section) Acute Rehab PT Goals Patient  Stated Goal: To go to CIR. PT Goal Formulation: With patient/family Time For Goal Achievement: 11/12/20 Potential to Achieve Goals: Good Progress towards PT goals: Progressing toward goals    Frequency    Min 3X/week      PT Plan Current plan remains appropriate    Co-evaluation              AM-PAC PT "6 Clicks" Mobility   Outcome Measure  Help needed turning from your back to your side while in a flat bed without using bedrails?: A Little Help needed moving from lying on your back to sitting on the side of a flat  bed without using bedrails?: A Little Help needed moving to and from a bed to a chair (including a wheelchair)?: A Little Help needed standing up from a chair using your arms (e.g., wheelchair or bedside chair)?: A Little Help needed to walk in hospital room?: A Little Help needed climbing 3-5 steps with a railing? : Total 6 Click Score: 16    End of Session   Activity Tolerance: Patient tolerated treatment well Patient left: in chair;with chair alarm set;with family/visitor present Nurse Communication: Mobility status PT Visit Diagnosis: Unsteadiness on feet (R26.81);Other abnormalities of gait and mobility (R26.89);Muscle weakness (generalized) (M62.81)     Time: 7672-0947 PT Time Calculation (min) (ACUTE ONLY): 37 min  Charges:  $Gait Training: 8-22 mins $Therapeutic Activity: 8-22 mins                     11/03/2020  Jacinto Halim., PT Acute Rehabilitation Services [phone]  (pager) [phone]  (office)   Paul Chambers 11/03/2020, 4:31 PM

## 2020-11-03 NOTE — Progress Notes (Signed)
PROGRESS NOTE    Paul Chambers  OTR:[id number] DOB: 04/07/1965 DOA: 10/20/2020 PCP: Pcp, No   Brief Narrative:  This 54 y.o. Caucasian male smoker presented to the [facility] Emergency Department via private vehicle with complaints of abdominal pain and altered mental status. The patient's wife reports that this is the third presentation to a healthcare facility in the past month for abdominal pain ([facility], [facility] and now [facility]).  The patient's deteriorating mental status over the past 2 days prompted the wife to force the patient to come to the hospital.  In the ER, CT head was negative for acute process, but CT abdomen showed an impressive pneumoperitoneum without an obvious location of perforation.  Surgery evaluated the patient and decided for exploratory laparotomy which was performed on 10/22/2020.  Sequence of events as below. Significant Hospital Events: Including procedures, antibiotic start and stop dates in addition to other pertinent events   9/8 - Presented to [facility] with abdominal pain. CT A/P with extensive pneumoperitoneum 9/9 - Ex-lap (no colonic perforation discovered, large necrotic fluid present surrounding pancreas. Transferred to ICU post-op, intubated and sedated.  9/10-received 1 unit PRBC for hemoglobin 6.7, went back on Levophed for short period of time 9/12 - Extubated to Venturi mask 9/13 - Off Fentanyl and Propofol gtt 10/28/20.  Patient transferred under [facility]H.  Assessment & Plan:   Principal Problem:   Severe sepsis (HCC) Active Problems:   Acute necrotizing pancreatitis   Toxic encephalopathy   Hyponatremia   Hypoalbuminemia   Macrocytic anemia   Pressure injury of skin   Protein-calorie malnutrition, severe   Infected Necrotizing Pancreatitis with Pneumoperitoneum s/p ex-lap by Dr. Drexel Iha on 10/22/2020: - General surgery following; appreciate their recommendations.  - Discontinued Linezolid, as he has completed course.  -  Continue TPN.  Also has NG tube but due to vomiting, tube feedings were held. Continues to have fever, had 1-2.6 at 3:30 AM today.  CT abdomen repeated 10/30/2020 by general surgery shows large amount of residual gas and fluid dissecting through the retroperitoneum, no evidence of enteric fistula or leak.  Patient remains on antibiotics and supportive therapy, think we should do another CT abdomen and pelvis because I do not think his pneumonia is the source of his fever, discussed with Dr. Magnus Ivan of general surgery but he prefers to wait until tomorrow.  Management per general surgery.  Will order blood cultures .   Acute respiratory failure with hypoxia/possible aspiration pneumonia/congestive heart failure/left pleural effusion: Intubated 10/22/2020 and extubated 10/26/2020.   Repeat chest x-ray 10/28/2020 shows worsening/increased left lung opacity concerning for pneumonia or atelectasis and associated left pleural effusion.  Antibiotics transition to Merrem.  Repeat procalcitonin improving.  Patient's respiratory status and lung exam is also improving while he is on Lasix.  However repeat chest x-ray on 10/31/2020 shows improved left pleural effusion but increased right lower base opacity, recommendation to repeat chest x-ray in 3 to 4 weeks.  Patient grew stenotrophomonas maltophilia in the sputum culture, antibiotics transitioned to Levaquin and Flagyl on 11/01/2020 per pharmacy recommendations.  Respiratory status improved significantly .  Continue Lasix for another day and reassess tomorrow.   Acute toxic-metabolic encephalopathy: Sepsis +/- ICU delirium: He is more alert and oriented today.  Maintain delirium precautions.  Ileus: Resolving.  Managed by general surgery.   Acute hepatitis: Stable. 2/2 to TPN - CMP daily    Alcohol abuse: Out of the window for withdrawal complications.   - Continue thiamine and folate  Acute Blood Loss Anemia, postsurgical: S/p 1 unit of pRBCs on 9/10 with  stabilization in hemoglobin since.  - Monitor H&H and transfuse if less than 7   Severe protein calorie malnutrition - Continue TPN   Diabetes type 2 with hyperglycemia: Hemoglobin A1c was 6.8% on admission.  Blood sugar elevated.  Hyperglycemia managed by pharmacy with insulin in TPN.   Hypokalemia: Resolved.  Hypophosphatemia: Replenished.  Dysphagia: Assessed by SLP.  Per SLP, patient is cleared to swallow but general surgery plans to keep him n.p.o. with jejunal tube feeds for now.   DVT prophylaxis: heparin injection 5,000 Units Start: 10/22/20 1400 Place and maintain sequential compression device Start: 11/02/2020 2202 SCDs Start: 10/23/2020 2145   Code Status: Full Code  Family Communication: Sister present at bedside.   Status is: Inpatient  Remains inpatient appropriate because:Inpatient level of care appropriate due to severity of illness  Dispo: The patient is from: Home              Anticipated d/c is to: CIR              Patient currently is not medically stable to d/c.   Difficult to place patient No        Estimated body mass index is 31.94 kg/m as calculated from the following:   Height as of this encounter: 5\' 9"  (1.753 m).   Weight as of this encounter: 98.1 kg.     Nutritional Assessment: Body mass index is 31.94 kg/m. Seen by dietician.  I agree with the assessment and plan as outlined below: Nutrition Status: Nutrition Problem: Severe Malnutrition Etiology: acute illness (infected necrotic pancreatitis) Signs/Symptoms: moderate fat depletion, moderate muscle depletion, energy intake < or equal to 50% for > or equal to 5 days, percent weight loss (8.4% weight loss in 1.5 months) Percent weight loss: 8.4 % (1.5 months) Interventions: TPN, Tube feeding, Refer to RD note for recommendations  .  Skin Assessment: I have examined the patient's skin and I agree with the wound assessment as performed by the wound care RN as outlined below:     Consultants:  General surgery  Procedures:  As above  Antimicrobials:  Anti-infectives (From admission, onward)    Start     Dose/Rate Route Frequency Ordered Stop   11/01/20 0900  levofloxacin (LEVAQUIN) IVPB 750 mg        750 mg 100 mL/hr over 90 Minutes Intravenous Every 24 hours 11/01/20 0824     11/01/20 0900  metroNIDAZOLE (FLAGYL) IVPB 500 mg        500 mg 100 mL/hr over 60 Minutes Intravenous Every 12 hours 11/01/20 0824     10/29/20 2200  meropenem (MERREM) 1 g in sodium chloride 0.9 % 100 mL IVPB  Status:  Discontinued        1 g 200 mL/hr over 30 Minutes Intravenous Every 8 hours 10/29/20 1428 11/01/20 0824   10/28/20 1615  meropenem (MERREM) 2 g in sodium chloride 0.9 % 100 mL IVPB  Status:  Discontinued        2 g 200 mL/hr over 30 Minutes Intravenous Every 8 hours 10/28/20 1515 10/29/20 1428   10/28/20 1445  metroNIDAZOLE (FLAGYL) IVPB 500 mg  Status:  Discontinued        500 mg 100 mL/hr over 60 Minutes Intravenous Every 12 hours 10/28/20 1348 10/28/20 1505   10/25/20 2300  fluconazole (DIFLUCAN) IVPB 400 mg        400 mg 100 mL/hr over 120  Minutes Intravenous Every 24 hours 10/25/20 0742 10/29/20 0718   10/24/20 2300  fluconazole (DIFLUCAN) IVPB 200 mg  Status:  Discontinued        200 mg 100 mL/hr over 60 Minutes Intravenous Every 24 hours 10/24/20 0943 10/25/20 0742   10/22/20 1800  vancomycin (VANCOREADY) IVPB 1500 mg/300 mL  Status:  Discontinued        1,500 mg 150 mL/hr over 120 Minutes Intravenous Every 24 hours 11/09/2020 2225 10/22/20 0759   10/22/20 0900  linezolid (ZYVOX) IVPB 600 mg  Status:  Discontinued        600 mg 300 mL/hr over 60 Minutes Intravenous Every 12 hours 10/22/20 0800 10/27/20 0852   10/22/20 0300  piperacillin-tazobactam (ZOSYN) IVPB 3.375 g  Status:  Discontinued        3.375 g 12.5 mL/hr over 240 Minutes Intravenous Every 8 hours 10/25/2020 2225 10/28/20 1514   11/05/2020 2335  metroNIDAZOLE (FLAGYL) IVPB 500 mg  Status:   Discontinued        500 mg 100 mL/hr over 60 Minutes Intravenous Every 12 hours 10/31/2020 2142 10/22/20 0747   10/23/2020 2248  fluconazole (DIFLUCAN) IVPB 400 mg  Status:  Discontinued        400 mg 100 mL/hr over 120 Minutes Intravenous Every 24 hours 10/25/2020 2142 10/24/20 0943   10/17/2020 1748  vancomycin (VANCOREADY) IVPB 2000 mg/400 mL        2,000 mg 200 mL/hr over 120 Minutes Intravenous  Once 10/23/2020 1706 10/31/2020 2113   10/25/2020 1715  piperacillin-tazobactam (ZOSYN) IVPB 3.375 g        3.375 g 100 mL/hr over 30 Minutes Intravenous  Once 11/01/2020 1706 10/29/2020 1903          Subjective: Patient seen and examined.  Slightly lethargic compared to yesterday.  In the bed.  Wants to get out and be moving.  Denies any shortness of breath, abdominal pain or other complaint.  Remains oriented.  Objective: Vitals:   11/03/20 0800 11/03/20 0834 11/03/20 0900 11/03/20 1000  BP: (!) 90/50  (!) 143/83 136/83  Pulse: (!) 103  100 (!) 105  Resp: (!) 27  20 (!) 21  Temp:      TempSrc:      SpO2: 93% 95% 94% 93%  Weight:      Height:        Intake/Output Summary (Last 24 hours) at 11/03/2020 1040 Last data filed at 11/03/2020 1026 Gross per 24 hour  Intake 2920.9 ml  Output 3835 ml  Net -914.1 ml    Filed Weights   11/01/20 0500 11/02/20 0444 11/03/20 0346  Weight: 97.9 kg 98 kg 98.1 kg    Examination:  General exam: Appears calm and comfortable, obese Respiratory system: Diminished breath sound at bases. Respiratory effort normal. Cardiovascular system: S1 & S2 heard, RRR. No JVD, murmurs, rubs, gallops or clicks. No pedal edema. Gastrointestinal system: Abdomen is moderately distended, slightly firm and nontender with 2 JP drains. No organomegaly or masses felt. Normal bowel sounds heard. Central nervous system: Alert and oriented. No focal neurological deficits. Extremities: Symmetric 5 x 5 power. Skin: No rashes, lesions or ulcers.  Psychiatry: Judgement and insight  appear normal. Mood & affect appropriate.   Data Reviewed: I have personally reviewed following labs and imaging studies  CBC: Recent Labs  Lab 10/28/20 0212 10/29/20 1008 10/30/20 0328 10/31/20 0352 11/02/20 0925  WBC 16.7* 12.4* 9.8 11.1* 9.4  NEUTROABS 15.2* 10.3* 7.3 8.0* 7.8*  HGB 8.6* 8.6*  8.3* 8.4* 8.6*  HCT 27.3* 26.8* 26.9* 27.0* 27.3*  MCV 106.2* 105.9* 119.6* 107.6* 105.8*  PLT 140* 114* 104* 112* 159    Basic Metabolic Panel: Recent Labs  Lab 10/28/20 0212 10/29/20 0630 10/30/20 0503 10/31/20 0352 11/01/20 0358 11/02/20 0925  NA 143 143 143 144 142 136  K 3.5 3.7 3.9 4.2 4.2 4.0  CL 108 108 107 107 99 101  CO2 27 29 29 30 29 27   GLUCOSE 154* 177* 174* 129* 212* 230*  BUN 15 13 15 16 17 17   CREATININE 0.69 0.57* 0.52* 0.61 0.59* 0.61  CALCIUM 8.7* 8.7* 8.6* 8.6* 8.7* 8.6*  MG 2.0  --   --   --  2.0  --   PHOS 2.1*  --  2.6  --  2.9  --     GFR: Estimated Creatinine Clearance: 124.8 mL/min (by C-G formula based on SCr of 0.61 mg/dL). Liver Function Tests: Recent Labs  Lab 10/28/20 0212 10/29/20 0630 10/30/20 0503 10/31/20 0352 11/01/20 0358  AST 35 28 28 31  33  ALT 30 28 27 25 24   ALKPHOS 141* 147* 119 94 89  BILITOT 0.9 0.6 0.8 0.8 0.7  PROT 5.3* 5.6* 5.4* 5.6* 5.7*  ALBUMIN 1.5* 1.5* 1.5* 1.5* 1.5*    No results for input(s): LIPASE, AMYLASE in the last 168 hours.  No results for input(s): AMMONIA in the last 168 hours.  Coagulation Profile: No results for input(s): INR, PROTIME in the last 168 hours.  Cardiac Enzymes: No results for input(s): CKTOTAL, CKMB, CKMBINDEX, TROPONINI in the last 168 hours.  BNP (last 3 results) No results for input(s): PROBNP in the last 8760 hours. HbA1C: No results for input(s): HGBA1C in the last 72 hours. CBG: Recent Labs  Lab 11/02/20 1520 11/02/20 1951 11/02/20 2346 11/03/20 0324 11/03/20 0722  GLUCAP 162* 154* 149* 130* 149*    Lipid Profile: Recent Labs    11/01/20 0358  TRIG 68     Thyroid Function Tests: No results for input(s): TSH, T4TOTAL, FREET4, T3FREE, THYROIDAB in the last 72 hours. Anemia Panel: No results for input(s): VITAMINB12, FOLATE, FERRITIN, TIBC, IRON, RETICCTPCT in the last 72 hours. Sepsis Labs: Recent Labs  Lab 10/28/20 1347 10/31/20 0937  PROCALCITON 2.38 1.12     Recent Results (from the past 240 hour(s))  Expectorated Sputum Assessment w Gram Stain, Rflx to Resp Cult     Status: None   Collection Time: 10/28/20  2:57 PM   Specimen: Expectorated Sputum  Result Value Ref Range Status   Specimen Description EXPECTORATED SPUTUM  Final   Special Requests NONE  Final   Sputum evaluation   Final    THIS SPECIMEN IS ACCEPTABLE FOR SPUTUM CULTURE Performed at [facility], [address], [place], [place] [address]    Report Status 10/30/2020 FINAL  Final  Culture, Respiratory w Gram Stain     Status: None   Collection Time: 10/28/20  2:57 PM  Result Value Ref Range Status   Specimen Description EXPECTORATED SPUTUM  Final   Special Requests NONE Reflexed from [id number]  Final   Gram Stain   Final    FEW SQUAMOUS EPITHELIAL CELLS PRESENT MODERATE WBC PRESENT,BOTH PMN AND MONONUCLEAR ABUNDANT GRAM NEGATIVE RODS Performed at [facility], [address], [place], [place] [address]    Culture MODERATE STENOTROPHOMONAS MALTOPHILIA  Final   Report Status 11/01/2020 FINAL  Final   Organism ID, Bacteria STENOTROPHOMONAS MALTOPHILIA  Final  Susceptibility   Stenotrophomonas maltophilia - MIC*    LEVOFLOXACIN 0.5 SENSITIVE Sensitive     TRIMETH/SULFA <=20 SENSITIVE Sensitive     * MODERATE STENOTROPHOMONAS MALTOPHILIA       Radiology Studies: DG Abd Portable 1V  Result Date: 11/02/2020 CLINICAL DATA:  Abdominal pain for several days EXAM: PORTABLE ABDOMEN - 1 VIEW COMPARISON:  Film from the previous day. FINDINGS: Feeding catheter is again noted in the proximal aspect of the jejunum. Scattered large and small bowel gas  is noted. Surgical drains are again seen consistent with the known history. No new focal abnormality is noted. IMPRESSION: Stable appearance of the abdomen when compare with the previous day. Surgical drains remain in place. Electronically Signed   By: Alcide Clever M.D.   On: 11/02/2020 15:24   DG Abd Portable 1V  Result Date: 11/01/2020 CLINICAL DATA:  Check feeding catheter placement EXAM: PORTABLE ABDOMEN - 1 VIEW COMPARISON:  10/29/2020 FINDINGS: Feeding catheter is noted extending into the fourth portion of the duodenum/proximal jejunum. Surgical drains are noted in place IMPRESSION: Feeding catheter in the distal duodenum/proximal jejunum. Electronically Signed   By: Alcide Clever M.D.   On: 11/01/2020 15:49    Scheduled Meds:  chlorhexidine  15 mL Mouth Rinse BID   Chlorhexidine Gluconate Cloth  6 each Topical Q0600   fluticasone furoate-vilanterol  1 puff Inhalation Daily   And   umeclidinium bromide  1 puff Inhalation Daily   furosemide  40 mg Intravenous BID   heparin  5,000 Units Subcutaneous Q8H   insulin aspart  0-20 Units Subcutaneous Q4H   mouth rinse  15 mL Mouth Rinse q12n4p   pantoprazole (PROTONIX) IV  40 mg Intravenous Q24H   sodium chloride flush  10-40 mL Intracatheter Q12H   sodium chloride flush  10-40 mL Intracatheter Q12H   Continuous Infusions:  sodium chloride Stopped (10/31/20 0131)   feeding supplement (VITAL 1.5 CAL) Stopped (11/02/20 1800)   levofloxacin (LEVAQUIN) IV Stopped (11/02/20 1123)   metronidazole 500 mg (11/03/20 1022)   TPN ADULT (ION) 100 mL/hr at 11/03/20 1000   TPN ADULT (ION)       LOS: 13 days   Time spent: 28 minutes   Hughie Closs, MD [facility]  11/03/2020, 10:40 AM  Please page via Loretha Stapler and do not message via secure chat for anything urgent. Secure chat can be used for anything non urgent and I will respond at my earliest availability.  How to contact the [facility] Attending or Consulting provider 7A - 7P or covering  provider during after hours 7P -7A, for this patient?  Check the care team in [facility] and look for a) attending/consulting [facility] provider listed and b) the [facility] team listed. Page or secure chat 7A-7P. Log into [email or web address] and use [facility] universal password to access. If you do not have the password, please contact the hospital operator. Locate the [facility] provider you are looking for under [facility] and page to a number that you can be directly reached. If you still have difficulty reaching the provider, please page the [facility] (Director on Call) for the Hospitalists listed on amion for assistance.

## 2020-11-03 NOTE — Progress Notes (Signed)
13 Days Post-Op   Subjective/Chief Complaint: Tube feeds held again secondary to emesis last evening   Objective: Vital signs in last 24 hours: Temp:  [98.6 F (37 C)-102.6 F (39.2 C)] 98.7 F (37.1 C) (09/21 0724) Pulse Rate:  [94-115] 101 (09/21 0700) Resp:  [15-33] 23 (09/21 0700) BP: (113-145)/(67-104) 118/77 (09/21 0700) SpO2:  [82 %-97 %] 94 % (09/21 0700) Weight:  [98.1 kg] 98.1 kg (09/21 0346) Last BM Date: 10/30/20  Intake/Output from previous day: 09/20 0701 - 09/21 0700 In: 2925.8 [I.V.:2386; NG/GT:189.7; IV Piggyback:350.2] Out: 3205 [Urine:3050; Drains:155] Intake/Output this shift: No intake/output data recorded.  Exam: Awake and alert Abdomen seems the same, full in the epigastrium, drains consistent with pancreatic necrosis  Lab Results:  Recent Labs    11/02/20 0925  WBC 9.4  HGB 8.6*  HCT 27.3*  PLT 159   BMET Recent Labs    11/01/20 0358 11/02/20 0925  NA 142 136  K 4.2 4.0  CL 99 101  CO2 29 27  GLUCOSE 212* 230*  BUN 17 17  CREATININE 0.59* 0.61  CALCIUM 8.7* 8.6*   PT/INR No results for input(s): LABPROT, INR in the last 72 hours. ABG No results for input(s): PHART, HCO3 in the last 72 hours.  Invalid input(s): PCO2, PO2  Studies/Results: DG Abd Portable 1V  Result Date: 11/02/2020 CLINICAL DATA:  Abdominal pain for several days EXAM: PORTABLE ABDOMEN - 1 VIEW COMPARISON:  Film from the previous day. FINDINGS: Feeding catheter is again noted in the proximal aspect of the jejunum. Scattered large and small bowel gas is noted. Surgical drains are again seen consistent with the known history. No new focal abnormality is noted. IMPRESSION: Stable appearance of the abdomen when compare with the previous day. Surgical drains remain in place. Electronically Signed   By: Alcide Clever M.D.   On: 11/02/2020 15:24   DG Abd Portable 1V  Result Date: 11/01/2020 CLINICAL DATA:  Check feeding catheter placement EXAM: PORTABLE ABDOMEN - 1 VIEW  COMPARISON:  10/29/2020 FINDINGS: Feeding catheter is noted extending into the fourth portion of the duodenum/proximal jejunum. Surgical drains are noted in place IMPRESSION: Feeding catheter in the distal duodenum/proximal jejunum. Electronically Signed   By: Alcide Clever M.D.   On: 11/01/2020 15:49    Anti-infectives: Anti-infectives (From admission, onward)    Start     Dose/Rate Route Frequency Ordered Stop   11/01/20 0900  levofloxacin (LEVAQUIN) IVPB 750 mg        750 mg 100 mL/hr over 90 Minutes Intravenous Every 24 hours 11/01/20 0824     11/01/20 0900  metroNIDAZOLE (FLAGYL) IVPB 500 mg        500 mg 100 mL/hr over 60 Minutes Intravenous Every 12 hours 11/01/20 0824     10/29/20 2200  meropenem (MERREM) 1 g in sodium chloride 0.9 % 100 mL IVPB  Status:  Discontinued        1 g 200 mL/hr over 30 Minutes Intravenous Every 8 hours 10/29/20 1428 11/01/20 0824   10/28/20 1615  meropenem (MERREM) 2 g in sodium chloride 0.9 % 100 mL IVPB  Status:  Discontinued        2 g 200 mL/hr over 30 Minutes Intravenous Every 8 hours 10/28/20 1515 10/29/20 1428   10/28/20 1445  metroNIDAZOLE (FLAGYL) IVPB 500 mg  Status:  Discontinued        500 mg 100 mL/hr over 60 Minutes Intravenous Every 12 hours 10/28/20 1348 10/28/20 1505   10/25/20  2300  fluconazole (DIFLUCAN) IVPB 400 mg        400 mg 100 mL/hr over 120 Minutes Intravenous Every 24 hours 10/25/20 0742 10/29/20 0718   10/24/20 2300  fluconazole (DIFLUCAN) IVPB 200 mg  Status:  Discontinued        200 mg 100 mL/hr over 60 Minutes Intravenous Every 24 hours 10/24/20 0943 10/25/20 0742   10/22/20 1800  vancomycin (VANCOREADY) IVPB 1500 mg/300 mL  Status:  Discontinued        1,500 mg 150 mL/hr over 120 Minutes Intravenous Every 24 hours 11/07/2020 2225 10/22/20 0759   10/22/20 0900  linezolid (ZYVOX) IVPB 600 mg  Status:  Discontinued        600 mg 300 mL/hr over 60 Minutes Intravenous Every 12 hours 10/22/20 0800 10/27/20 0852   10/22/20  0300  piperacillin-tazobactam (ZOSYN) IVPB 3.375 g  Status:  Discontinued        3.375 g 12.5 mL/hr over 240 Minutes Intravenous Every 8 hours 10/31/2020 2225 10/28/20 1514   10/31/2020 2335  metroNIDAZOLE (FLAGYL) IVPB 500 mg  Status:  Discontinued        500 mg 100 mL/hr over 60 Minutes Intravenous Every 12 hours 11/12/2020 2142 10/22/20 0747   10/16/2020 2248  fluconazole (DIFLUCAN) IVPB 400 mg  Status:  Discontinued        400 mg 100 mL/hr over 120 Minutes Intravenous Every 24 hours 10/20/2020 2142 10/24/20 0943   10/28/2020 1748  vancomycin (VANCOREADY) IVPB 2000 mg/400 mL        2,000 mg 200 mL/hr over 120 Minutes Intravenous  Once 10/17/2020 1706 10/16/2020 2113   10/18/2020 1715  piperacillin-tazobactam (ZOSYN) IVPB 3.375 g        3.375 g 100 mL/hr over 30 Minutes Intravenous  Once 10/18/2020 1706 11/08/2020 1903       Assessment/Plan: POD 12 s/p ex lap with pancreatic debridement for infected necrotic pancreatitis by Dr. Sheliah Hatch on 9/9 - Superior drain under the mesocolon along with the head of the pancreas - Inferior drain under the mesocolon along the left upper abdomen and what is likely a position inferior to the pancreas - Continue drains and monitor  Continue to hold tube feeds today given emesis Continue TPN Will resume feeds tomottow    LOS: 13 days    Paul Chambers 11/03/2020

## 2020-11-03 NOTE — Progress Notes (Signed)
PHARMACY - TOTAL PARENTERAL NUTRITION CONSULT NOTE  Indication:  severe pancreatitis  Patient Measurements: Height: 5\' 9"  (175.3 cm) Weight: 98.1 kg (216 lb 4.3 oz) IBW/kg (Calculated) : 70.7 TPN AdjBW (KG): 78 Body mass index is 31.94 kg/m.  Assessment:  70 yom presenting 9/8 with septic shock due to necrotizing pancreatitis with pneumoperitoneum s/p ex-lap 9/9. Pt transferred to ICU post-op, intubated and sedated. Pharmacy consulted to manage TPN for severe pancreatitis. Pt at risk of refeeding with minimal PO intake x 1 week PTA, decreased PO intake x 1.5 months PTA with significant weight loss and hx heavy alcohol abuse.  Glucose / Insulin: A1c 6.8% (no meds pta). CBGs trended up with TF initiation>>on hold 2/2 emesis 80 units insulin in TPN, Novolog 3u q4hr, + SSI  Electrolytes: 9/120labs - all WNL except elevated CoCa at 10.7 Renal: AKI resolved - SCr 0.61, BUN wnl (lasix increased to 40mg  IV BID on 9/17) Hepatic: LFTs / tbili normalized, TG WNL, albumin 1.5 Intake / Output; MIVF: abd JP drains x 2 O/P , UOP 1.68ml/kg/hr, net +9.8L, LBM 9/17 GI Imaging: 9/8 CT A/P - extensive pneumoperitoneum 9/16 Repeat CT A/P - large amount of residual gas and fluid dissecting through retroperitoneum, no evidence of enteric fistula or leak  GI Surgeries / Procedures:  9/9 ex-lap (no colonic perforation, large necrotic fluid present surrounding pancreas)  Central access: CVC 10/22/20 TPN start date: 10/23/20  Nutritional Goals, RD Estimated Needs Total Energy Estimated Needs: 2350-2550 Total Protein Estimated Needs: 125-145 grams Total Fluid Estimated Needs: >/= 2.0 L  Current Nutrition:  TPN Vital on hold 2/2 emesis  Plan:  **TF held 2/2 emesis. Plan to continue TPN at full rate and re-trial TF tomorrow.  Continue TPN at goal rate of 100 ml/hr to provide 132g AA, 336g CHO and 72g ILE for a total of 2390 kCal, meeting 100% of patient's needs Electrolytes in TPN: Na 110mEq/L, K 40  mEq/L, reduce Ca to 35mEq/L on 9/19, Mg 4mEq/L, Phos 44mmol/L, Max CL (~2:1 ratio) Add standard MVI, trace elements, folic acid 1mg , and thiamine 100mg  to TPN Continue resistant SSI Q4H and 80 units regular insulin in TPN Resume novolog 3u q4hr upon initiation of TF 9/22 Standard TPN labs on Mon and Thurs Monitor volume status, TF advancement to start weaning TPN  , PharmD Clinical Pharmacist  Please check AMION for all [facility] Pharmacy numbers After 10:00 PM, call Main Pharmacy [phone]

## 2020-11-03 NOTE — Progress Notes (Signed)
SLP Cancellation Note  Patient Details Name: Paul Chambers MRN: [id number] DOB: 05-05-1965   Cancelled treatment:       Reason Eval/Treat Not Completed: Patient not medically ready. Pt continues to follow for readiness to advance diet. Tube feeds currently held due to emesis, so no progress today.    Durga Saldarriaga, Riley Nearing 11/03/2020, 8:54 AM

## 2020-11-03 NOTE — Progress Notes (Signed)
Occupational Therapy Treatment Patient Details Name: Paul Chambers MRN: [id number] DOB: 1965/10/22 Today's Date: 11/03/2020   History of present illness Pt adm 9/8 with abdominal pain and AMS. Pt found to have extensive pneumoperitoneum and underwent ex lap on 9/9. Pt with infected necrotizing pancreatitis. Pt with acute hypoxic respiratory failure. Pt intubated 9/9-9/12. PMH - etoh, HTN, gout, pancreatitis.   OT comments  Pt to EOB with min assist. Stood x 2 from elevated bed with min guard assist and transferred to chair with RW. Cues needed for hand placement. Pt completed oral care once in chair with set up. Progressing steadily. Continues to be appropriate for CIR.    Recommendations for follow up therapy are one component of a multi-disciplinary discharge planning process, led by the attending physician.  Recommendations may be updated based on patient status, additional functional criteria and insurance authorization.    Follow Up Recommendations  CIR    Equipment Recommendations  3 in 1 bedside commode;Tub/shower seat    Recommendations for Other Services      Precautions / Restrictions Precautions Precautions: Fall Precaution Comments: JP drains out of ostomy bag       Mobility Bed Mobility Overal bed mobility: Needs Assistance Bed Mobility: Supine to Sit     Supine to sit: Min assist     General bed mobility comments: assist to pivot to side of bed, declined rolling technique to protect abdomen    Transfers Overall transfer level: Needs assistance Equipment used: Rolling walker (2 wheeled) Transfers: Sit to/from [employer] Sit to Stand: Min guard Stand pivot transfers: Min guard       General transfer comment: cues for hand placement, stood x 2 from bed    Balance Overall balance assessment: Needs assistance   Sitting balance-Leahy Scale: Fair     Standing balance support: Bilateral upper extremity supported;During functional  activity Standing balance-Leahy Scale: Poor Standing balance comment: Reliant on BUE on RW                           ADL either performed or assessed with clinical judgement   ADL Overall ADL's : Needs assistance/impaired     Grooming: Oral care;Sitting;Set up                       Toileting- Clothing Manipulation and Hygiene: Total assistance;Sit to/from stand               Vision       Perception     Praxis      Cognition Arousal/Alertness: Awake/alert Behavior During Therapy: Flat affect Overall Cognitive Status: Impaired/Different from baseline Area of Impairment: Following commands;Problem solving                       Following Commands: Follows one step commands with increased time     Problem Solving: Slow processing;Difficulty sequencing;Requires verbal cues;Requires tactile cues          Exercises     Shoulder Instructions       General Comments      Pertinent Vitals/ Pain       Pain Assessment: Faces Faces Pain Scale: Hurts little more Pain Location: abdomen Pain Descriptors / Indicators: Sore;Discomfort Pain Intervention(s): Monitored during session;Repositioned  Home Living  Prior Functioning/Environment              Frequency  Min 2X/week        Progress Toward Goals  OT Goals(current goals can now be found in the care plan section)  Progress towards OT goals: Progressing toward goals  Acute Rehab OT Goals Patient Stated Goal: To go to CIR. OT Goal Formulation: With patient Time For Goal Achievement: 11/12/20 Potential to Achieve Goals: Good  Plan Discharge plan remains appropriate;Frequency remains appropriate    Co-evaluation                 AM-PAC OT "6 Clicks" Daily Activity     Outcome Measure   Help from another person eating meals?: A Little Help from another person taking care of personal grooming?: A  Little Help from another person toileting, which includes using toliet, bedpan, or urinal?: Total Help from another person bathing (including washing, rinsing, drying)?: A Lot Help from another person to put on and taking off regular upper body clothing?: A Little Help from another person to put on and taking off regular lower body clothing?: Total 6 Click Score: 13    End of Session Equipment Utilized During Treatment: Rolling walker;Oxygen (2L)  OT Visit Diagnosis: Unsteadiness on feet (R26.81);Muscle weakness (generalized) (M62.81);Other symptoms and signs involving cognitive function;Pain   Activity Tolerance Patient tolerated treatment well   Patient Left in chair;with call bell/phone within reach;with chair alarm set   Nurse Communication Other (comment) (pt with scant brown drainage upon standing)        Time: 6962-9528 OT Time Calculation (min): 38 min  Charges: OT General Charges $OT Visit: 1 Visit OT Treatments $Self Care/Home Management : 38-52 mins  Martie Round, OTR/L Acute Rehabilitation Services Pager: [phone] Office: [phone]   Evern Bio 11/03/2020, 12:51 PM

## 2020-11-04 ENCOUNTER — Inpatient Hospital Stay (HOSPITAL_COMMUNITY): Payer: BC Managed Care – PPO

## 2020-11-04 DIAGNOSIS — A419 Sepsis, unspecified organism: Secondary | ICD-10-CM | POA: Diagnosis not present

## 2020-11-04 DIAGNOSIS — R6520 Severe sepsis without septic shock: Secondary | ICD-10-CM | POA: Diagnosis not present

## 2020-11-04 LAB — GLUCOSE, CAPILLARY
Glucose-Capillary: 184 mg/dL — ABNORMAL HIGH (ref 70–99)
Glucose-Capillary: 192 mg/dL — ABNORMAL HIGH (ref 70–99)
Glucose-Capillary: 210 mg/dL — ABNORMAL HIGH (ref 70–99)
Glucose-Capillary: 211 mg/dL — ABNORMAL HIGH (ref 70–99)
Glucose-Capillary: 211 mg/dL — ABNORMAL HIGH (ref 70–99)
Glucose-Capillary: 217 mg/dL — ABNORMAL HIGH (ref 70–99)
Glucose-Capillary: 219 mg/dL — ABNORMAL HIGH (ref 70–99)

## 2020-11-04 LAB — CBC WITH DIFFERENTIAL/PLATELET
Abs Immature Granulocytes: 0 10*3/uL (ref 0.00–0.07)
Basophils Absolute: 0.1 10*3/uL (ref 0.0–0.1)
Basophils Relative: 1 %
Eosinophils Absolute: 0.2 10*3/uL (ref 0.0–0.5)
Eosinophils Relative: 2 %
HCT: 25.1 % — ABNORMAL LOW (ref 39.0–52.0)
Hemoglobin: 7.8 g/dL — ABNORMAL LOW (ref 13.0–17.0)
Lymphocytes Relative: 11 %
Lymphs Abs: 0.9 10*3/uL (ref 0.7–4.0)
MCH: 32.5 pg (ref 26.0–34.0)
MCHC: 31.1 g/dL (ref 30.0–36.0)
MCV: 104.6 fL — ABNORMAL HIGH (ref 80.0–100.0)
Monocytes Absolute: 0.7 10*3/uL (ref 0.1–1.0)
Monocytes Relative: 8 %
Neutro Abs: 6.4 10*3/uL (ref 1.7–7.7)
Neutrophils Relative %: 78 %
Platelets: 191 10*3/uL (ref 150–400)
RBC: 2.4 MIL/uL — ABNORMAL LOW (ref 4.22–5.81)
RDW: 17.8 % — ABNORMAL HIGH (ref 11.5–15.5)
WBC: 8.2 10*3/uL (ref 4.0–10.5)
nRBC: 0 /100 WBC
nRBC: 0.5 % — ABNORMAL HIGH (ref 0.0–0.2)

## 2020-11-04 LAB — COMPREHENSIVE METABOLIC PANEL
ALT: 17 U/L (ref 0–44)
AST: 23 U/L (ref 15–41)
Albumin: 1.4 g/dL — ABNORMAL LOW (ref 3.5–5.0)
Alkaline Phosphatase: 76 U/L (ref 38–126)
Anion gap: 6 (ref 5–15)
BUN: 16 mg/dL (ref 6–20)
CO2: 27 mmol/L (ref 22–32)
Calcium: 8.2 mg/dL — ABNORMAL LOW (ref 8.9–10.3)
Chloride: 104 mmol/L (ref 98–111)
Creatinine, Ser: 0.58 mg/dL — ABNORMAL LOW (ref 0.61–1.24)
GFR, Estimated: >60 mL/min (ref >60)
Glucose, Bld: 236 mg/dL — ABNORMAL HIGH (ref 70–99)
Potassium: 4 mmol/L (ref 3.5–5.1)
Sodium: 137 mmol/L (ref 135–145)
Total Bilirubin: 0.5 mg/dL (ref 0.3–1.2)
Total Protein: 5.6 g/dL — ABNORMAL LOW (ref 6.5–8.1)

## 2020-11-04 LAB — PROCALCITONIN: Procalcitonin: 1.55 ng/mL

## 2020-11-04 LAB — MAGNESIUM: Magnesium: 1.9 mg/dL (ref 1.7–2.4)

## 2020-11-04 LAB — PHOSPHORUS: Phosphorus: 3.1 mg/dL (ref 2.5–4.6)

## 2020-11-04 MED ORDER — INSULIN ASPART 100 UNIT/ML IJ SOLN
3.0000 [IU] | INTRAMUSCULAR | Status: DC
  Administered 2020-11-04 – 2020-11-05 (×6): 3 [IU] via SUBCUTANEOUS

## 2020-11-04 MED ORDER — FAT EMUL FISH OIL/PLANT BASED 20% (SMOFLIPID)IV EMUL
INTRAVENOUS | Status: AC
  Filled 2020-11-04: qty 1320

## 2020-11-04 NOTE — Progress Notes (Signed)
PROGRESS NOTE    Paul Chambers  ZOX:[id number] DOB: 1965/04/14 DOA: 11/12/2020 PCP: Pcp, No   Brief Narrative:  This 55 y.o. Caucasian male smoker presented to the [facility] Emergency Department via private vehicle with complaints of abdominal pain and altered mental status. The patient's wife reports that this is the third presentation to a healthcare facility in the past month for abdominal pain ([facility], [facility] and now [facility]).  The patient's deteriorating mental status over the past 2 days prompted the wife to force the patient to come to the hospital.  In the ER, CT head was negative for acute process, but CT abdomen showed an impressive pneumoperitoneum without an obvious location of perforation.  Surgery evaluated the patient and decided for exploratory laparotomy which was performed on 10/22/2020.  Sequence of events as below. Significant Hospital Events: Including procedures, antibiotic start and stop dates in addition to other pertinent events   9/8 - Presented to [facility] with abdominal pain. CT A/P with extensive pneumoperitoneum 9/9 - Ex-lap (no colonic perforation discovered, large necrotic fluid present surrounding pancreas. Transferred to ICU post-op, intubated and sedated.  9/10-received 1 unit PRBC for hemoglobin 6.7, went back on Levophed for short period of time 9/12 - Extubated to Venturi mask 9/13 - Off Fentanyl and Propofol gtt 10/28/20.  Patient transferred under TRH.  Assessment & Plan:   Principal Problem:   Severe sepsis (HCC) Active Problems:   Acute necrotizing pancreatitis   Toxic encephalopathy   Hyponatremia   Hypoalbuminemia   Macrocytic anemia   Pressure injury of skin   Protein-calorie malnutrition, severe   Infected Necrotizing Pancreatitis with Pneumoperitoneum s/p ex-lap by Dr. Drexel Iha on 10/22/2020: - General surgery following; appreciate their recommendations.  - Discontinued Linezolid, as he has completed course.  -  Continue TPN.  Also has NG tube but due to vomiting, tube feedings were held. Continues to have fever, had 1-2.6 at 3:30 AM today.  CT abdomen repeated 10/30/2020 by general surgery shows large amount of residual gas and fluid dissecting through the retroperitoneum, no evidence of enteric fistula or leak.  Patient remains on antibiotics and supportive therapy, continues to have fever but now low-grade, think we should do another CT abdomen and pelvis because I do not think his pneumonia is the source of his fever, discussed with Dr. Magnus Ivan of general surgery yesterday but he preferred to wait at least 24 hours.  Defer to general surgery if they would like to obtain CT abdomen again.  Management per general surgery.  Blood culture remain negative.  No leukocytosis.  Procalcitonin was also improving.  We will recheck again today.   Acute respiratory failure with hypoxia/possible aspiration pneumonia/congestive heart failure/left pleural effusion: Intubated 10/22/2020 and extubated 10/26/2020.   Repeat chest x-ray 10/28/2020 shows worsening/increased left lung opacity concerning for pneumonia or atelectasis and associated left pleural effusion.  Antibiotics transition to Merrem.  Repeat procalcitonin improving.  Patient's respiratory status and lung exam is also improving while he is on Lasix.  However repeat chest x-ray on 10/31/2020 shows improved left pleural effusion but increased right lower base opacity, recommendation to repeat chest x-ray in 3 to 4 weeks.  Patient grew stenotrophomonas maltophilia in the sputum culture, antibiotics transitioned to Levaquin and Flagyl on 11/01/2020 per pharmacy recommendations.  Patient's respiratory status improved, he was off of oxygen but currently he is again on 4 L.  Has diminished breath sounds at the bases..  Repeat chest x-ray today.  Continue Lasix for another day  and reassess tomorrow.   Acute toxic-metabolic encephalopathy: Sepsis +/- ICU delirium: He is more alert  and oriented today.  Maintain delirium precautions.  Ileus: Resolving.  Managed by general surgery.   Acute hepatitis: Stable. 2/2 to TPN - CMP daily    Alcohol abuse: Out of the window for withdrawal complications.   - Continue thiamine and folate   Acute Blood Loss Anemia, postsurgical: S/p 1 unit of pRBCs on 9/10 with stabilization in hemoglobin since.  - Monitor H&H and transfuse if less than 7   Severe protein calorie malnutrition - Continue TPN   Diabetes type 2 with hyperglycemia: Hemoglobin A1c was 6.8% on admission.  Blood sugar elevated.  Hyperglycemia managed by pharmacy with insulin in TPN.   Hypokalemia: Resolved.  Hypophosphatemia: Replenished.  Dysphagia: Assessed by SLP.  Per SLP, patient is cleared to swallow but general surgery plans to keep him n.p.o. with jejunal tube feeds for now.   DVT prophylaxis: heparin injection 5,000 Units Start: 10/22/20 1400 Place and maintain sequential compression device Start: 11/09/2020 2202 SCDs Start: 10/14/2020 2145   Code Status: Full Code  Family Communication: Sister present at bedside.   Status is: Inpatient  Remains inpatient appropriate because:Inpatient level of care appropriate due to severity of illness  Dispo: The patient is from: Home              Anticipated d/c is to: CIR              Patient currently is not medically stable to d/c.   Difficult to place patient No   Estimated body mass index is 32.62 kg/m as calculated from the following:   Height as of this encounter: 5\' 9"  (1.753 m).   Weight as of this encounter: 100.2 kg.  Nutritional Assessment: Body mass index is 32.62 kg/m. Seen by dietician.  I agree with the assessment and plan as outlined below: Nutrition Status: Nutrition Problem: Severe Malnutrition Etiology: acute illness (infected necrotic pancreatitis) Signs/Symptoms: moderate fat depletion, moderate muscle depletion, energy intake < or equal to 50% for > or equal to 5 days, percent  weight loss (8.4% weight loss in 1.5 months) Percent weight loss: 8.4 % (1.5 months) Interventions: TPN, Tube feeding, Refer to RD note for recommendations   Skin Assessment: I have examined the patient's skin and I agree with the wound assessment as performed by the wound care RN as outlined below:   Consultants:  General surgery  Procedures:  As above  Antimicrobials:  Anti-infectives (From admission, onward)    Start     Dose/Rate Route Frequency Ordered Stop   11/01/20 0900  levofloxacin (LEVAQUIN) IVPB 750 mg        750 mg 100 mL/hr over 90 Minutes Intravenous Every 24 hours 11/01/20 0824     11/01/20 0900  metroNIDAZOLE (FLAGYL) IVPB 500 mg        500 mg 100 mL/hr over 60 Minutes Intravenous Every 12 hours 11/01/20 0824     10/29/20 2200  meropenem (MERREM) 1 g in sodium chloride 0.9 % 100 mL IVPB  Status:  Discontinued        1 g 200 mL/hr over 30 Minutes Intravenous Every 8 hours 10/29/20 1428 11/01/20 0824   10/28/20 1615  meropenem (MERREM) 2 g in sodium chloride 0.9 % 100 mL IVPB  Status:  Discontinued        2 g 200 mL/hr over 30 Minutes Intravenous Every 8 hours 10/28/20 1515 10/29/20 1428   10/28/20 1445  metroNIDAZOLE (  FLAGYL) IVPB 500 mg  Status:  Discontinued        500 mg 100 mL/hr over 60 Minutes Intravenous Every 12 hours 10/28/20 1348 10/28/20 1505   10/25/20 2300  fluconazole (DIFLUCAN) IVPB 400 mg        400 mg 100 mL/hr over 120 Minutes Intravenous Every 24 hours 10/25/20 0742 10/29/20 0718   10/24/20 2300  fluconazole (DIFLUCAN) IVPB 200 mg  Status:  Discontinued        200 mg 100 mL/hr over 60 Minutes Intravenous Every 24 hours 10/24/20 0943 10/25/20 0742   10/22/20 1800  vancomycin (VANCOREADY) IVPB 1500 mg/300 mL  Status:  Discontinued        1,500 mg 150 mL/hr over 120 Minutes Intravenous Every 24 hours 10/15/2020 2225 10/22/20 0759   10/22/20 0900  linezolid (ZYVOX) IVPB 600 mg  Status:  Discontinued        600 mg 300 mL/hr over 60 Minutes  Intravenous Every 12 hours 10/22/20 0800 10/27/20 0852   10/22/20 0300  piperacillin-tazobactam (ZOSYN) IVPB 3.375 g  Status:  Discontinued        3.375 g 12.5 mL/hr over 240 Minutes Intravenous Every 8 hours 10/23/2020 2225 10/28/20 1514   10/14/2020 2335  metroNIDAZOLE (FLAGYL) IVPB 500 mg  Status:  Discontinued        500 mg 100 mL/hr over 60 Minutes Intravenous Every 12 hours 11/12/2020 2142 10/22/20 0747   10/25/2020 2248  fluconazole (DIFLUCAN) IVPB 400 mg  Status:  Discontinued        400 mg 100 mL/hr over 120 Minutes Intravenous Every 24 hours 11/01/2020 2142 10/24/20 0943   10/23/2020 1748  vancomycin (VANCOREADY) IVPB 2000 mg/400 mL        2,000 mg 200 mL/hr over 120 Minutes Intravenous  Once 10/28/2020 1706 10/30/2020 2113   11/12/2020 1715  piperacillin-tazobactam (ZOSYN) IVPB 3.375 g        3.375 g 100 mL/hr over 30 Minutes Intravenous  Once 11/03/2020 1706 11/03/2020 1903          Subjective: Patient seen and examined.  He denies any complaint.  No shortness of breath or abdominal pain.  Wife at the bedside.  Objective: Vitals:   11/04/20 0603 11/04/20 0723 11/04/20 0806 11/04/20 1200  BP:  135/73  136/74  Pulse:  100 (!) 102   Resp:  14 16   Temp:  99.6 F (37.6 C)  99.2 F (37.3 C)  TempSrc:  Oral  Oral  SpO2:  (!) 83% 90% 93%  Weight: 100.2 kg     Height:        Intake/Output Summary (Last 24 hours) at 11/04/2020 1224 Last data filed at 11/04/2020 0600 Gross per 24 hour  Intake --  Output 650 ml  Net -650 ml    Filed Weights   11/02/20 0444 11/03/20 0346 11/04/20 0603  Weight: 98 kg 98.1 kg 100.2 kg    Examination:  General exam: Appears calm and comfortable  Respiratory system: Diminished breath sounds.  Poor inspiratory effort Cardiovascular system: S1 & S2 heard, RRR. No JVD, murmurs, rubs, gallops or clicks. No pedal edema. Gastrointestinal system: Abdomen is nondistended, soft and nontender. No organomegaly or masses felt. Normal bowel sounds heard. Central  nervous system: Alert and oriented. No focal neurological deficits. Extremities: Symmetric 5 x 5 power. Skin: No rashes, lesions or ulcers.  Psychiatry: Judgement and insight appear normal. Mood & affect appropriate.    Data Reviewed: I have personally reviewed following labs and imaging  studies  CBC: Recent Labs  Lab 10/29/20 1008 10/30/20 0328 10/31/20 0352 11/02/20 0925 11/04/20 0847  WBC 12.4* 9.8 11.1* 9.4 8.2  NEUTROABS 10.3* 7.3 8.0* 7.8* 6.4  HGB 8.6* 8.3* 8.4* 8.6* 7.8*  HCT 26.8* 26.9* 27.0* 27.3* 25.1*  MCV 105.9* 119.6* 107.6* 105.8* 104.6*  PLT 114* 104* 112* 159 191    Basic Metabolic Panel: Recent Labs  Lab 10/30/20 0503 10/31/20 0352 11/01/20 0358 11/02/20 0925 11/04/20 0847  NA 143 144 142 136 137  K 3.9 4.2 4.2 4.0 4.0  CL 107 107 99 101 104  CO2 29 30 29 27 27   GLUCOSE 174* 129* 212* 230* 236*  BUN 15 16 17 17 16   CREATININE 0.52* 0.61 0.59* 0.61 0.58*  CALCIUM 8.6* 8.6* 8.7* 8.6* 8.2*  MG  --   --  2.0  --  1.9  PHOS 2.6  --  2.9  --  3.1    GFR: Estimated Creatinine Clearance: 126 mL/min (A) (by C-G formula based on SCr of 0.58 mg/dL (L)). Liver Function Tests: Recent Labs  Lab 10/29/20 0630 10/30/20 0503 10/31/20 0352 11/01/20 0358 11/04/20 0847  AST 28 28 31  33 23  ALT 28 27 25 24 17   ALKPHOS 147* 119 94 89 76  BILITOT 0.6 0.8 0.8 0.7 0.5  PROT 5.6* 5.4* 5.6* 5.7* 5.6*  ALBUMIN 1.5* 1.5* 1.5* 1.5* 1.4*    No results for input(s): LIPASE, AMYLASE in the last 168 hours.  No results for input(s): AMMONIA in the last 168 hours.  Coagulation Profile: No results for input(s): INR, PROTIME in the last 168 hours.  Cardiac Enzymes: No results for input(s): CKTOTAL, CKMB, CKMBINDEX, TROPONINI in the last 168 hours.  BNP (last 3 results) No results for input(s): PROBNP in the last 8760 hours. HbA1C: No results for input(s): HGBA1C in the last 72 hours. CBG: Recent Labs  Lab 11/03/20 1924 11/03/20 2303 11/04/20 0308  11/04/20 0735 11/04/20 1157  GLUCAP 220* 232* 211* 210* 217*    Lipid Profile: No results for input(s): CHOL, HDL, LDLCALC, TRIG, CHOLHDL, LDLDIRECT in the last 72 hours.  Thyroid Function Tests: No results for input(s): TSH, T4TOTAL, FREET4, T3FREE, THYROIDAB in the last 72 hours. Anemia Panel: No results for input(s): VITAMINB12, FOLATE, FERRITIN, TIBC, IRON, RETICCTPCT in the last 72 hours. Sepsis Labs: Recent Labs  Lab 10/28/20 1347 10/31/20 0937  PROCALCITON 2.38 1.12     Recent Results (from the past 240 hour(s))  Expectorated Sputum Assessment w Gram Stain, Rflx to Resp Cult     Status: None   Collection Time: 10/28/20  2:57 PM   Specimen: Expectorated Sputum  Result Value Ref Range Status   Specimen Description EXPECTORATED SPUTUM  Final   Special Requests NONE  Final   Sputum evaluation   Final    THIS SPECIMEN IS ACCEPTABLE FOR SPUTUM CULTURE Performed at [facility], [address], [place], 10/30/20 [facility]    Report Status 10/30/2020 FINAL  Final  Culture, Respiratory w Gram Stain     Status: None   Collection Time: 10/28/20  2:57 PM  Result Value Ref Range Status   Specimen Description EXPECTORATED SPUTUM  Final   Special Requests NONE Reflexed from [place]  Final   Gram Stain   Final    FEW SQUAMOUS EPITHELIAL CELLS PRESENT MODERATE WBC PRESENT,BOTH PMN AND MONONUCLEAR ABUNDANT GRAM NEGATIVE RODS Performed at [facility], [address] [place], [address] [facility]    Culture MODERATE STENOTROPHOMONAS  MALTOPHILIA  Final   Report Status 11/01/2020 FINAL  Final   Organism ID, Bacteria STENOTROPHOMONAS MALTOPHILIA  Final      Susceptibility   Stenotrophomonas maltophilia - MIC*    LEVOFLOXACIN 0.5 SENSITIVE Sensitive     TRIMETH/SULFA <=20 SENSITIVE Sensitive     * MODERATE STENOTROPHOMONAS MALTOPHILIA  Culture, blood (Routine X 2) w Reflex to ID Panel     Status: None (Preliminary result)   Collection Time: 11/03/20 11:23 AM   Specimen:  BLOOD  Result Value Ref Range Status   Specimen Description BLOOD LEFT ANTECUBITAL  Final   Special Requests   Final    BOTTLES DRAWN AEROBIC ONLY Blood Culture adequate volume   Culture   Final    NO GROWTH < 24 HOURS Performed at [facility], [address], [place], [place] [address]    Report Status PENDING  Incomplete  Culture, blood (Routine X 2) w Reflex to ID Panel     Status: None (Preliminary result)   Collection Time: 11/03/20 11:24 AM   Specimen: BLOOD LEFT HAND  Result Value Ref Range Status   Specimen Description BLOOD LEFT HAND  Final   Special Requests   Final    BOTTLES DRAWN AEROBIC ONLY Blood Culture adequate volume   Culture   Final    NO GROWTH < 24 HOURS Performed at [facility], [address] [place], [place] [address]    Report Status PENDING  Incomplete       Radiology Studies: DG Abd Portable 1V  Result Date: 11/02/2020 CLINICAL DATA:  Abdominal pain for several days EXAM: PORTABLE ABDOMEN - 1 VIEW COMPARISON:  Film from the previous day. FINDINGS: Feeding catheter is again noted in the proximal aspect of the jejunum. Scattered large and small bowel gas is noted. Surgical drains are again seen consistent with the known history. No new focal abnormality is noted. IMPRESSION: Stable appearance of the abdomen when compare with the previous day. Surgical drains remain in place. Electronically Signed   By: Alcide Clever M.D.   On: 11/02/2020 15:24    Scheduled Meds:  chlorhexidine  15 mL Mouth Rinse BID   Chlorhexidine Gluconate Cloth  6 each Topical Q0600   fluticasone furoate-vilanterol  1 puff Inhalation Daily   And   umeclidinium bromide  1 puff Inhalation Daily   furosemide  40 mg Intravenous BID   heparin  5,000 Units Subcutaneous Q8H   insulin aspart  0-20 Units Subcutaneous Q4H   insulin aspart  3 Units Subcutaneous Q4H   mouth rinse  15 mL Mouth Rinse q12n4p   pantoprazole (PROTONIX) IV  40 mg Intravenous Q24H   sodium chloride  flush  10-40 mL Intracatheter Q12H   sodium chloride flush  10-40 mL Intracatheter Q12H   Continuous Infusions:  sodium chloride Stopped (10/31/20 0131)   feeding supplement (VITAL 1.5 CAL) 1,000 mL (11/03/20 1100)   levofloxacin (LEVAQUIN) IV 750 mg (11/04/20 1042)   metronidazole 500 mg (11/04/20 0831)   TPN ADULT (ION) 100 mL/hr at 11/03/20 1724   TPN ADULT (ION)       LOS: 14 days   Time spent: 29 minutes   Hughie Closs, MD [facility]  11/04/2020, 12:24 PM  Please page via Amion and do not message via secure chat for anything urgent. Secure chat can be used for anything non urgent and I will respond at my earliest availability.  How to contact the [facility] Attending or Consulting provider 7A -  7P or covering provider during after hours 7P -7A, for this patient?  Check the care team in [facility] and look for a) attending/consulting [facility] provider listed and b) the [facility] team listed. Page or secure chat 7A-7P. Log into [email or web address] and use [place]'s universal password to access. If you do not have the password, please contact the hospital operator. Locate the [facility], The provider you are looking for under [facility] and page to a number that you can be directly reached. If you still have difficulty reaching the provider, please page the [facility] (Director on Call) for the Hospitalists listed on amion for assistance.

## 2020-11-04 NOTE — Progress Notes (Signed)
Update given to wife when she called

## 2020-11-04 NOTE — Progress Notes (Signed)
IP rehab admissions - Noted patient on TPN and TF 20 ml/hr.  Not medically ready for inpatient rehab at this point.  Call for questions.  [phone]

## 2020-11-04 NOTE — Progress Notes (Signed)
PHARMACY - TOTAL PARENTERAL NUTRITION CONSULT NOTE  Indication:  severe pancreatitis  Patient Measurements: Height: 5\' 9"  (175.3 cm) Weight: 100.2 kg (220 lb 14.4 oz) IBW/kg (Calculated) : 70.7 TPN AdjBW (KG): 78 Body mass index is 32.62 kg/m.  Assessment:  29 yom presenting 9/8 with septic shock due to necrotizing pancreatitis with pneumoperitoneum s/p ex-lap 9/9. Pt transferred to ICU post-op, intubated and sedated. Pharmacy consulted to manage TPN for severe pancreatitis. Pt at risk of refeeding with minimal PO intake x 1 week PTA, decreased PO intake x 1.5 months PTA with significant weight loss and hx heavy alcohol abuse.  Glucose / Insulin: A1c 6.8% (no meds pta). CBGs elevated in the 200s with TF on board Used 21 units SSI in past 24 hrs, 80 units insulin in TPN Electrolytes: all WNL (CoCa down to 10.2 post reduction on 9/19) Renal: AKI resolved - SCr < 1 stable , BUN WNL (Lasix increased to 40mg  IV BID on 9/17) Hepatic: LFTs / tbili normalized, TG WNL, albumin 1.4 Intake / Output; MIVF: abd JP drains x 2 O/P 63mL, UOP 0.6 ml/kg/hr, net +8.5L (down), LBM 9/21, emesis 9/20 GI Imaging: 9/8 CT - extensive pneumoperitoneum 9/16 CT - large amount of residual gas and fluid dissecting through retroperitoneum, no evidence of enteric fistula or leak  9/19 abd XR - feeding catheter in distal duodenum/proximal jejunum GI Surgeries / Procedures:  9/9 ex-lap (no colonic perforation, large necrotic fluid present surrounding pancreas)  Central access: CVC 10/22/20 TPN start date: 10/23/20  Nutritional Goals, RD Estimated Needs Total Energy Estimated Needs: 2350-2550 Total Protein Estimated Needs: 125-145 grams Total Fluid Estimated Needs: >/= 2.0 L  Current Nutrition:  TPN Vital 1.5 on hold on 9/20 d/t emesis *per RN, trickle feed infusing since 9/21 AM prior to transfer to 3W.  Request RN to chart.  Plan:  Continue TPN at goal rate of 100 ml/hr to provide 132g AA, 336g CHO and 72g ILE  for a total of 2390 kCal, meeting 100% of patient's needs Electrolytes in TPN: Na 71mEq/L, K 40 mEq/L, Ca 10mEq/L, Mg 60mEq/L, Phos 57mmol/L, max CL (~2:1 ratio) - no change today 9/22 Add standard MVI, trace elements, folic acid 1mg , and thiamine 100mg  to TPN Continue resistant SSI Q4H and 80 units regular insulin in TPN Resume Novolog 3 units Q4H, hold for CBG < 100 or if TF is held Standard TPN labs on Mon and Thurs Monitor volume status, repeat CT, TF tolerance to increase rate and start weaning TPN  Ezmae Speers D. , PharmD, BCPS, BCCCP 11/04/2020, 10:34 AM

## 2020-11-04 NOTE — Progress Notes (Signed)
14 Days Post-Op  Subjective: CC: Patient feels his abdomen is less tight. Reports pain across the middle of his abdomen. No nausea or emesis. Back on TF's at 39ml/hr. Passing flatus. BM yesterday.   Objective: Vital signs in last 24 hours: Temp:  [98.7 F (37.1 C)-100.7 F (38.2 C)] 99.6 F (37.6 C) (09/22 0723) Pulse Rate:  [100-108] 102 (09/22 0806) Resp:  [14-21] 16 (09/22 0806) BP: (126-139)/(71-83) 135/73 (09/22 0723) SpO2:  [83 %-93 %] 90 % (09/22 0806) Weight:  [100.2 kg] 100.2 kg (09/22 0603) Last BM Date: 11/03/20  Intake/Output from previous day: 09/21 0701 - 09/22 0700 In: 310.1 [I.V.:310.1] Out: 1580 [Urine:1500; Drains:80] Intake/Output this shift: No intake/output data recorded.  PE: Gen:  Awake and alert, sitting up in chair in nad Heart: Tachycardic  Pulm:  Normal rate and effort  Abd: Distended but soft, mild generalized tenderness without rigidity or guarding. RUQ drains x2 with brown fluid in bulbs. Midline wound pale pink with early granulation, and clean without dehiscence.  Msk: 2+ pitting edema of the b/l LE's  Lab Results:  Recent Labs    11/02/20 0925 11/04/20 0847  WBC 9.4 8.2  HGB 8.6* 7.8*  HCT 27.3* 25.1*  PLT 159 191   BMET Recent Labs    11/02/20 0925  NA 136  K 4.0  CL 101  CO2 27  GLUCOSE 230*  BUN 17  CREATININE 0.61  CALCIUM 8.6*   PT/INR No results for input(s): LABPROT, INR in the last 72 hours. CMP     Component Value Date/Time   NA 136 11/02/2020 0925   K 4.0 11/02/2020 0925   CL 101 11/02/2020 0925   CO2 27 11/02/2020 0925   GLUCOSE 230 (H) 11/02/2020 0925   BUN 17 11/02/2020 0925   CREATININE 0.61 11/02/2020 0925   CALCIUM 8.6 (L) 11/02/2020 0925   PROT 5.7 (L) 11/01/2020 0358   ALBUMIN 1.5 (L) 11/01/2020 0358   AST 33 11/01/2020 0358   ALT 24 11/01/2020 0358   ALKPHOS 89 11/01/2020 0358   BILITOT 0.7 11/01/2020 0358   GFRNONAA >60 11/02/2020 0925   Lipase     Component Value Date/Time    LIPASE 25 11/12/2020 1506    Studies/Results: DG Abd Portable 1V  Result Date: 11/02/2020 CLINICAL DATA:  Abdominal pain for several days EXAM: PORTABLE ABDOMEN - 1 VIEW COMPARISON:  Film from the previous day. FINDINGS: Feeding catheter is again noted in the proximal aspect of the jejunum. Scattered large and small bowel gas is noted. Surgical drains are again seen consistent with the known history. No new focal abnormality is noted. IMPRESSION: Stable appearance of the abdomen when compare with the previous day. Surgical drains remain in place. Electronically Signed   By: Alcide Clever M.D.   On: 11/02/2020 15:24    Anti-infectives: Anti-infectives (From admission, onward)    Start     Dose/Rate Route Frequency Ordered Stop   11/01/20 0900  levofloxacin (LEVAQUIN) IVPB 750 mg        750 mg 100 mL/hr over 90 Minutes Intravenous Every 24 hours 11/01/20 0824     11/01/20 0900  metroNIDAZOLE (FLAGYL) IVPB 500 mg        500 mg 100 mL/hr over 60 Minutes Intravenous Every 12 hours 11/01/20 0824     10/29/20 2200  meropenem (MERREM) 1 g in sodium chloride 0.9 % 100 mL IVPB  Status:  Discontinued        1 g 200 mL/hr over  30 Minutes Intravenous Every 8 hours 10/29/20 1428 11/01/20 0824   10/28/20 1615  meropenem (MERREM) 2 g in sodium chloride 0.9 % 100 mL IVPB  Status:  Discontinued        2 g 200 mL/hr over 30 Minutes Intravenous Every 8 hours 10/28/20 1515 10/29/20 1428   10/28/20 1445  metroNIDAZOLE (FLAGYL) IVPB 500 mg  Status:  Discontinued        500 mg 100 mL/hr over 60 Minutes Intravenous Every 12 hours 10/28/20 1348 10/28/20 1505   10/25/20 2300  fluconazole (DIFLUCAN) IVPB 400 mg        400 mg 100 mL/hr over 120 Minutes Intravenous Every 24 hours 10/25/20 0742 10/29/20 0718   10/24/20 2300  fluconazole (DIFLUCAN) IVPB 200 mg  Status:  Discontinued        200 mg 100 mL/hr over 60 Minutes Intravenous Every 24 hours 10/24/20 0943 10/25/20 0742   10/22/20 1800  vancomycin (VANCOREADY)  IVPB 1500 mg/300 mL  Status:  Discontinued        1,500 mg 150 mL/hr over 120 Minutes Intravenous Every 24 hours 10/25/20 2225 10/22/20 0759   10/22/20 0900  linezolid (ZYVOX) IVPB 600 mg  Status:  Discontinued        600 mg 300 mL/hr over 60 Minutes Intravenous Every 12 hours 10/22/20 0800 10/27/20 0852   10/22/20 0300  piperacillin-tazobactam (ZOSYN) IVPB 3.375 g  Status:  Discontinued        3.375 g 12.5 mL/hr over 240 Minutes Intravenous Every 8 hours 10/25/2020 2225 10/28/20 1514   10/25/2020 2335  metroNIDAZOLE (FLAGYL) IVPB 500 mg  Status:  Discontinued        500 mg 100 mL/hr over 60 Minutes Intravenous Every 12 hours 10-25-20 2142 10/22/20 0747   2020-10-25 2248  fluconazole (DIFLUCAN) IVPB 400 mg  Status:  Discontinued        400 mg 100 mL/hr over 120 Minutes Intravenous Every 24 hours 2020-10-25 2142 10/24/20 0943   10-25-20 1748  vancomycin (VANCOREADY) IVPB 2000 mg/400 mL        2,000 mg 200 mL/hr over 120 Minutes Intravenous  Once October 25, 2020 1706 Oct 25, 2020 2113   10-25-20 1715  piperacillin-tazobactam (ZOSYN) IVPB 3.375 g        3.375 g 100 mL/hr over 30 Minutes Intravenous  Once 2020-10-25 1706 2020/10/25 1903        Assessment/Plan POD 13 s/p ex lap with pancreatic debridement for infected necrotic pancreatitis by Dr. Sheliah Hatch on 9/9 - Superior drain under the mesocolon along with the head of the pancreas - Inferior drain under the mesocolon along the left upper abdomen and what is likely a position inferior to the pancreas - Continue drains and monitor - Keep post pyloric TF's at current rate along with TPN. Consider increasing to 1/2 goal rate TF's and decreasing TPN to 1/2 tomorrow.   -CT 9/17 demonstrates large amount of residual gas and fluid dissecting through the retroperitoneum, no evidence of enteric fistula or leak. Primary planning on repeating a CT scan w/ IV contrast today - BID WTD - Cont therapies, rec CIR who is following - Pulm toilet - Appreciate TRH's  assistance w/ his care   FEN: TPN, TF's at 79ml/hr (goal 55ml/hr).  ID: Levoquin/Flagyl VTE: SCDs, heparin subq Foley - out, ext cath, voiding    - Per primary -  VDRF - now extubated PNA w/ + resp cx's - cx's w/ stenotrophomonas maltophilia - on abx  Abl anemia - hgb 7.8. Am  CBC ETOH use  AKI - resolved T2DM   LOS: 14 days    Jacinto Halim , [facility] 11/04/2020, 9:12 AM Please see Amion for pager number during day hours 7:00am-4:30pm

## 2020-11-05 ENCOUNTER — Inpatient Hospital Stay (HOSPITAL_COMMUNITY): Payer: BC Managed Care – PPO

## 2020-11-05 DIAGNOSIS — R609 Edema, unspecified: Secondary | ICD-10-CM | POA: Diagnosis not present

## 2020-11-05 DIAGNOSIS — A419 Sepsis, unspecified organism: Secondary | ICD-10-CM | POA: Diagnosis not present

## 2020-11-05 DIAGNOSIS — R6520 Severe sepsis without septic shock: Secondary | ICD-10-CM | POA: Diagnosis not present

## 2020-11-05 LAB — CBC
HCT: 25 % — ABNORMAL LOW (ref 39.0–52.0)
Hemoglobin: 8 g/dL — ABNORMAL LOW (ref 13.0–17.0)
MCH: 33.3 pg (ref 26.0–34.0)
MCHC: 32 g/dL (ref 30.0–36.0)
MCV: 104.2 fL — ABNORMAL HIGH (ref 80.0–100.0)
Platelets: 215 10*3/uL (ref 150–400)
RBC: 2.4 MIL/uL — ABNORMAL LOW (ref 4.22–5.81)
RDW: 17.9 % — ABNORMAL HIGH (ref 11.5–15.5)
WBC: 8.6 10*3/uL (ref 4.0–10.5)
nRBC: 0.9 % — ABNORMAL HIGH (ref 0.0–0.2)

## 2020-11-05 LAB — GLUCOSE, CAPILLARY
Glucose-Capillary: 171 mg/dL — ABNORMAL HIGH (ref 70–99)
Glucose-Capillary: 185 mg/dL — ABNORMAL HIGH (ref 70–99)
Glucose-Capillary: 192 mg/dL — ABNORMAL HIGH (ref 70–99)
Glucose-Capillary: 202 mg/dL — ABNORMAL HIGH (ref 70–99)
Glucose-Capillary: 228 mg/dL — ABNORMAL HIGH (ref 70–99)

## 2020-11-05 MED ORDER — HALOPERIDOL LACTATE 5 MG/ML IJ SOLN
5.0000 mg | Freq: Four times a day (QID) | INTRAMUSCULAR | Status: DC | PRN
  Administered 2020-11-05 – 2020-11-28 (×6): 5 mg via INTRAVENOUS
  Filled 2020-11-05 (×10): qty 1

## 2020-11-05 MED ORDER — TRAVASOL 10 % IV SOLN
INTRAVENOUS | Status: AC
  Filled 2020-11-05: qty 660

## 2020-11-05 MED ORDER — INSULIN ASPART 100 UNIT/ML IJ SOLN
6.0000 [IU] | INTRAMUSCULAR | Status: DC
  Administered 2020-11-05 – 2020-11-06 (×6): 6 [IU] via SUBCUTANEOUS

## 2020-11-05 MED ORDER — PROSOURCE TF PO LIQD
45.0000 mL | Freq: Three times a day (TID) | ORAL | Status: DC
  Administered 2020-11-05 – 2020-11-17 (×30): 45 mL
  Filled 2020-11-05 (×34): qty 45

## 2020-11-05 MED ORDER — VITAL 1.5 CAL PO LIQD
1000.0000 mL | ORAL | Status: DC
  Administered 2020-11-05 – 2020-11-16 (×12): 1000 mL
  Filled 2020-11-05 (×14): qty 1000

## 2020-11-05 NOTE — Progress Notes (Signed)
PT Cancellation Note  Patient Details Name: Paul Chambers MRN: [id number] DOB: 1965/02/24   Cancelled Treatment:    Reason Eval/Treat Not Completed: Patient declined, no reason specified;Other (comment) Pt visibly upset upon PT arrival and getting increasingly more agitated with discussion, "I am going home, I am done with this place!." Holding PT tx at this time as pt adamantly refusing. Will follow.   Blake Divine A Kannon Granderson 11/05/2020, 2:00 PM [place], PT, DPT Acute Rehabilitation Services Pager [phone] Office [phone]

## 2020-11-05 NOTE — Progress Notes (Signed)
PHARMACY - TOTAL PARENTERAL NUTRITION CONSULT NOTE  Indication:  severe pancreatitis  Patient Measurements: Height: 5\' 9"  (175.3 cm) Weight: 100.2 kg (220 lb 14.4 oz) IBW/kg (Calculated) : 70.7 TPN AdjBW (KG): 78 Body mass index is 32.62 kg/m.  Assessment:  12 yom presenting 9/8 with septic shock due to necrotizing pancreatitis with pneumoperitoneum s/p ex-lap 9/9. Pt transferred to ICU post-op, intubated and sedated. Pharmacy consulted to manage TPN for severe pancreatitis. Pt at risk of refeeding with minimal PO intake x 1 week PTA, decreased PO intake x 1.5 months PTA with significant weight loss and hx heavy alcohol abuse.  Glucose / Insulin: A1c 6.8% (no meds pta). CBGs improving Used 32 units SSI in past 24 hrs, 80 units insulin in TPN, Novolog 3u Q4H (9) Electrolytes: all WNL on 9/22 (CoCa down to 10.2 post reduction on 9/19) Renal: AKI resolved - SCr < 1 stable , BUN WNL (Lasix increased to 40mg  IV BID on 9/17) Hepatic: LFTs / tbili normalized, TG WNL, albumin 1.4 Intake / Output; MIVF: abd JP drains x 2 O/P , UOP not charted, net +7.L (down), LBM 9/22, emesis 9/20 GI Imaging: 9/8 CT - extensive pneumoperitoneum 9/16 CT - large amount of residual gas and fluid dissecting through retroperitoneum, no evidence of enteric fistula or leak  9/19 abd XR - feeding catheter in distal duodenum/proximal jejunum GI Surgeries / Procedures:  9/9 ex-lap (no colonic perforation, large necrotic fluid present surrounding pancreas)  Central access: CVC 10/22/20 TPN start date: 10/23/20  Nutritional Goals, RD Estimated Needs Total Energy Estimated Needs: 2350-2550 Total Protein Estimated Needs: 125-145 grams Total Fluid Estimated Needs: >/= 2.0 L  Current Nutrition:  TPN Vital 1.5 at 20 ml/hr >> increase to 30 ml/hr then advance by 10 ml Q8H until goal rate 65 ml/hr  Plan:  1/2 TPN with TF being advanced - TPN at 50 ml/hr Electrolytes in TPN: Na 56mEq/L, K 40 mEq/L, Ca 65mEq/L, Mg  65mEq/L, Phos 28mmol/L, max CL (~2:1 ratio)  Add standard MVI, trace elements, folic acid 1mg , and thiamine 100mg  to TPN Continue resistant SSI Q4H and reduce regular insulin in TPN to 40 units Increase Novolog 6 units Q4H, hold for CBG < 100 or if TF is held Standard TPN labs on Mon and Thurs Monitor volume status, CBGs, TF advancement to wean off of TPN  Paul Chambers D. , PharmD, BCPS, BCCCP 11/05/2020, 9:19 AM

## 2020-11-05 NOTE — Progress Notes (Signed)
Pt immediately goes to sleep after receiving IV Dilaudid for  Frontal HA across the eyebrows. After sleeping for around 3 hrs he wakes up with the same thing. The only thing the Dilaudid is going is letting him get sleep. He doesn't c/o abd or surgical pain.

## 2020-11-05 NOTE — Progress Notes (Signed)
15 Days Post-Op  Subjective: CC: Complains of a HA. Having some mild right upper/epigastric abdominal pain at rest that increases with movement. Tolerating tf's without n/v. Passing flatus. BM yesterday. Drain output down but similar in characteristic. Up to chair yesterday per patient. Voiding.   Objective: Vital signs in last 24 hours: Temp:  [98.2 F (36.8 C)-99.2 F (37.3 C)] 98.3 F (36.8 C) (09/23 0800) Pulse Rate:  [101-109] 102 (09/23 0800) Resp:  [16-22] 16 (09/23 0800) BP: (131-147)/(74-84) 133/75 (09/23 0800) SpO2:  [91 %-93 %] 93 % (09/23 0800) Last BM Date: 11/04/20  Intake/Output from previous day: 09/22 0701 - 09/23 0700 In: 10 [I.V.:10] Out: 115 [Drains:115] D1 - 15cc/24 hours D2 - 100cc/24 hours.  Intake/Output this shift: No intake/output data recorded.  PE: Gen:  Awake and alert, sitting up in chair in nad Heart: Tachycardic  Pulm:  Normal rate and effort  Abd: Distended but soft, mild generalized tenderness greatest around the top of his midline incision without rigidity or guarding. RUQ drains x2 with brown fluid in bulbs. I recharged these. Midline wound pale pink with early granulation, and clean without dehiscence. Laparoscopic site with staple in place, c/d/i Msk: 2+ pitting edema of the b/l LE's with RLE calf tenderness    Lab Results:  Recent Labs    11/04/20 0847 11/05/20 0345  WBC 8.2 8.6  HGB 7.8* 8.0*  HCT 25.1* 25.0*  PLT 191 215   BMET Recent Labs    11/02/20 0925 11/04/20 0847  NA 136 137  K 4.0 4.0  CL 101 104  CO2 27 27  GLUCOSE 230* 236*  BUN 17 16  CREATININE 0.61 0.58*  CALCIUM 8.6* 8.2*   PT/INR No results for input(s): LABPROT, INR in the last 72 hours. CMP     Component Value Date/Time   NA 137 11/04/2020 0847   K 4.0 11/04/2020 0847   CL 104 11/04/2020 0847   CO2 27 11/04/2020 0847   GLUCOSE 236 (H) 11/04/2020 0847   BUN 16 11/04/2020 0847   CREATININE 0.58 (L) 11/04/2020 0847   CALCIUM 8.2 (L)  11/04/2020 0847   PROT 5.6 (L) 11/04/2020 0847   ALBUMIN 1.4 (L) 11/04/2020 0847   AST 23 11/04/2020 0847   ALT 17 11/04/2020 0847   ALKPHOS 76 11/04/2020 0847   BILITOT 0.5 11/04/2020 0847   GFRNONAA >60 11/04/2020 0847   Lipase     Component Value Date/Time   LIPASE 25 11/10/2020 1506    Studies/Results: DG CHEST PORT 1 VIEW  Result Date: 11/04/2020 CLINICAL DATA:  Hypoxia and altered mental status. EXAM: PORTABLE CHEST 1 VIEW COMPARISON:  October 31, 2020 FINDINGS: There is stable right-sided PICC line and nasogastric tube positioning. Mild, stable bibasilar atelectasis and/or infiltrate is seen. Small bilateral pleural effusions are noted, right greater than left. This is mildly increased in size on the right when compared to the prior study. No pneumothorax is identified the heart size and mediastinal contours are within normal limits. The visualized skeletal structures are unremarkable. IMPRESSION: 1. Mild, stable bibasilar atelectasis and/or infiltrate. 2. Small bilateral pleural effusions, right greater than left. This is mildly increased in size on the right when compared to the prior study. Electronically Signed   By: Aram Candela M.D.   On: 11/04/2020 15:21    Anti-infectives: Anti-infectives (From admission, onward)    Start     Dose/Rate Route Frequency Ordered Stop   11/01/20 0900  levofloxacin (LEVAQUIN) IVPB 750 mg  750 mg 100 mL/hr over 90 Minutes Intravenous Every 24 hours 11/01/20 0824     11/01/20 0900  metroNIDAZOLE (FLAGYL) IVPB 500 mg        500 mg 100 mL/hr over 60 Minutes Intravenous Every 12 hours 11/01/20 0824     10/29/20 2200  meropenem (MERREM) 1 g in sodium chloride 0.9 % 100 mL IVPB  Status:  Discontinued        1 g 200 mL/hr over 30 Minutes Intravenous Every 8 hours 10/29/20 1428 11/01/20 0824   10/28/20 1615  meropenem (MERREM) 2 g in sodium chloride 0.9 % 100 mL IVPB  Status:  Discontinued        2 g 200 mL/hr over 30 Minutes  Intravenous Every 8 hours 10/28/20 1515 10/29/20 1428   10/28/20 1445  metroNIDAZOLE (FLAGYL) IVPB 500 mg  Status:  Discontinued        500 mg 100 mL/hr over 60 Minutes Intravenous Every 12 hours 10/28/20 1348 10/28/20 1505   10/25/20 2300  fluconazole (DIFLUCAN) IVPB 400 mg        400 mg 100 mL/hr over 120 Minutes Intravenous Every 24 hours 10/25/20 0742 10/29/20 0718   10/24/20 2300  fluconazole (DIFLUCAN) IVPB 200 mg  Status:  Discontinued        200 mg 100 mL/hr over 60 Minutes Intravenous Every 24 hours 10/24/20 0943 10/25/20 0742   10/22/20 1800  vancomycin (VANCOREADY) IVPB 1500 mg/300 mL  Status:  Discontinued        1,500 mg 150 mL/hr over 120 Minutes Intravenous Every 24 hours 2020/11/17 2225 10/22/20 0759   10/22/20 0900  linezolid (ZYVOX) IVPB 600 mg  Status:  Discontinued        600 mg 300 mL/hr over 60 Minutes Intravenous Every 12 hours 10/22/20 0800 10/27/20 0852   10/22/20 0300  piperacillin-tazobactam (ZOSYN) IVPB 3.375 g  Status:  Discontinued        3.375 g 12.5 mL/hr over 240 Minutes Intravenous Every 8 hours 11/17/2020 2225 10/28/20 1514   11-17-2020 2335  metroNIDAZOLE (FLAGYL) IVPB 500 mg  Status:  Discontinued        500 mg 100 mL/hr over 60 Minutes Intravenous Every 12 hours 17-Nov-2020 2142 10/22/20 0747   2020/11/17 2248  fluconazole (DIFLUCAN) IVPB 400 mg  Status:  Discontinued        400 mg 100 mL/hr over 120 Minutes Intravenous Every 24 hours Nov 17, 2020 2142 10/24/20 0943   11-17-2020 1748  vancomycin (VANCOREADY) IVPB 2000 mg/400 mL        2,000 mg 200 mL/hr over 120 Minutes Intravenous  Once 11-17-2020 1706 11/17/20 2113   11/17/20 1715  piperacillin-tazobactam (ZOSYN) IVPB 3.375 g        3.375 g 100 mL/hr over 30 Minutes Intravenous  Once 11/17/20 1706 17-Nov-2020 1903        Assessment/Plan POD 14 s/p ex lap with pancreatic debridement for infected necrotic pancreatitis by Dr. Sheliah Hatch on 9/9 - Superior drain under the mesocolon along with the head of the  pancreas - Inferior drain under the mesocolon along the left upper abdomen and what is likely a position inferior to the pancreas - Continue drains and monitor - Keep post pyloric TF's at current rate along with TPN. Increasing to 1/2 goal rate TF's and decreasing TPN to 1/2. Plan to increase TF's to goal in the AM if tolerates and d/c TPN. Will discuss with MD when patient will be able to take PO. -CT 9/17 demonstrates large amount  of residual gas and fluid dissecting through the retroperitoneum, no evidence of enteric fistula or leak. Continue to monitor fever and wbc curve to determine if patient will need repeat CT.  - BID WTD - D/c staple - Cont therapies, rec CIR who is following - Pulm toilet - Appreciate TRH's assistance w/ his care   FEN: 1/2 TPN, inc TF's  ID: Levoquin/Flagyl for PNA. Afebrile and wbc normalized. VTE: SCDs, heparin subq. Noted LE edema w/ R calf tenderness. Will check LE [place] to r/o DVT Foley - out, ext cath, voiding    - Per primary -  VDRF - now extubated PNA w/ + resp cx's - cx's w/ stenotrophomonas maltophilia - on abx  Abl anemia - hgb stable ETOH use  AKI - resolved T2DM   LOS: 15 days    Jacinto Halim , [facility] 11/05/2020, 8:51 AM Please see Amion for pager number during day hours 7:00am-4:30pm

## 2020-11-05 NOTE — Progress Notes (Signed)
Lower extremity venous has been completed.   Preliminary results in CV Proc.   Aundra Millet Jakobie Henslee 11/05/2020 10:37 AM

## 2020-11-05 NOTE — Progress Notes (Signed)
HOSPITAL MEDICINE OVERNIGHT EVENT NOTE    Notified by nursing the patient's MEWS score is now yellow, primarily caused by ongoing tachycardia and now fever of 101.1 F.  Chart reviewed, patient is currently being managed for necrotizing pancreatitis with pneumoperitoneum status post exploratory laparotomy on 9/9.  Patient is currently on antibiotics with levofloxacin and Flagyl.  Last blood cultures obtained were on 9/21 and currently exhibit no growth.  Nursing also reports that patient is quite agitated and continues to threaten to leave AGAINST MEDICAL ADVICE but has not acted on this.  Daytime provider has placed an order for as needed Haldol for agitation.  Nursing to administer Tylenol, Haldol and Dilaudid for his ongoing pain.  We will continue to follow closely.  Marinda Elk  MD [facility]

## 2020-11-05 NOTE — Progress Notes (Signed)
I was notified by primary nurse that patient was very agitated and was threatening to leave AGAINST MEDICAL ADVICE.  I spoke to patient's wife over the phone who was at the time at the bedside.  According to the wife, patient is complaining about poor nursing care.  He is complaining that he has been calling the nurses for his needs but it is taking very long for them to attend to him.  He was sitting in his feces for very long time before they would come and wash him/clean him.  Per wife, earlier he was seeming to have some sort of visual hallucinations as well.  She is very well aware what delirium is since this patient had delirium when he was in ICU.  She kind of thinks that currently patient is likely going through the same thing.  She was requesting to give him something to calm him down.  She does not want him to leave AGAINST MEDICAL ADVICE.  I have ordered Haldol as needed.  I have notified patient's primary nurse about this conversation and the medication order as well.  Wife very grateful at this point in time after talking to her.

## 2020-11-05 NOTE — Progress Notes (Signed)
Nutrition Follow-up  DOCUMENTATION CODES:   Severe malnutrition in context of acute illness/injury  INTERVENTION:   - Continue TPN management per Pharmacy, plan is to decrease to half rate today   Slowly advance tube feeds to goal via post-pyloric Cortrak tube: - Start Vital 1.5 @ 30 ml/hr and advance by 10 ml q 8 hours to goal rate of 65 ml/hr (1560 ml/day) - ProSource TF 45 ml TID   Tube feeding regimen at goal provides 2460 kcal, 138 grams of protein, and 1192 ml of H2O.  NUTRITION DIAGNOSIS:   Severe Malnutrition related to acute illness (infected necrotic pancreatitis) as evidenced by moderate fat depletion, moderate muscle depletion, energy intake < or equal to 50% for > or equal to 5 days, percent weight loss (8.4% weight loss in 1.5 months).  Ongoing, being addressed via TPN and trickle TF  GOAL:   Patient will meet greater than or equal to 90% of their needs  Met via TPN and TF  MONITOR:   Diet advancement, Labs, Weight trends, TF tolerance, Skin, I & O's, Other (TPN)  REASON FOR ASSESSMENT:   Ventilator    ASSESSMENT:   55 year old male who presented to the ED on 9/08 with AMS and abdominal pain. Pt seen at [facility] 1 week PTA and was diagnosed with AKI and pancreatitis. PMH of HTN, gout, tobacco abuse, EtOH abuse. Pt admitted with sepsis secondary to infected necrotic pancreatitis.  9/09 - s/p ex-lap with pancreatic debridement for infected necrotic pancreatitis 9/10 - TPN initiated 9/12 - TPN increased to goal rate, extubated 9/14 - NGT clamping trials, NGT removed by pt 9/15 - SLP evaluation with recommendations for NPO except ice chips 9/16 - Cortrak placed for administration of contrast for CT (tip gastric) 9/19 - Cortrak tube advanced post-pyloric, trickle TF initiated 9/20 - emesis, TF held 9/21 - trickle TF restarted 9/23 - TPN at half rate, starting to advance TF to goal  Spoke with pt at bedside. Pt lethargic and somewhat confused. He states  that he has consumed "a lot of water" today but is NPO. Pt denies any N/V or abdominal discomfort at this time.  Spoke with Surgery and Pharmacy. Plan is to decrease TPN to half rate today and begin advancing tube feeds to goal via post-pyloric Cortrak. Discussed plan with RN.  Admit weight: 93.7 kg Current weight: 100.2 kg  Weight trending up since admission. Pt is net positive 8.4 L. Pt currently with deep pitting edema to BUE and BLE.  Medications reviewed and include: IV lasix, SSI q 4 hours, novolog 6 units q 4 hours, IV protonix, IV abx, TPN  Labs reviewed: hemoglobin 8.0 CBG's: 171-219 x 24 hours  R abd JP drain 1: 15 ml x 24 hours R abd JP drain 2: 100 ml x 24 hours I/O's: +8.4 L since admit  Diet Order:   Diet Order             Diet NPO time specified Except for: Ice Chips  Diet effective now                   EDUCATION NEEDS:   No education needs have been identified at this time  Skin:  Skin Assessment: Skin Integrity Issues: Stage I: coccyx Incisions: abdomen  Last BM:  11/04/20 large type 6/type 7  Height:   Ht Readings from Last 1 Encounters:  10/18/2020 5' 9"  (1.753 m)    Weight:   Wt Readings from Last 1 Encounters:  11/04/20  100.2 kg    BMI:  Body mass index is 32.62 kg/m.  Estimated Nutritional Needs:   Kcal:  6734-1937  Protein:  125-145 grams  Fluid:  >/= 2.0 L    Gustavus Bryant, MS, RD, LDN Inpatient Clinical Dietitian Please see [facility] for contact information.

## 2020-11-05 NOTE — Progress Notes (Addendum)
PROGRESS NOTE    Paul Chambers  KGM:[id number] DOB: [birth year] DOA: 11/09/2020 PCP: Pcp, No   Brief Narrative:  This 55 y.o. Caucasian male smoker presented to the [facility] Emergency Department via private vehicle with complaints of abdominal pain and altered mental status. The patient's wife reports that this is the third presentation to a healthcare facility in the past month for abdominal pain ([facility], [facility] and now [facility]).  The patient's deteriorating mental status over the past 2 days prompted the wife to force the patient to come to the hospital.  In the ER, CT head was negative for acute process, but CT abdomen showed an impressive pneumoperitoneum without an obvious location of perforation.  Surgery evaluated the patient and decided for exploratory laparotomy which was performed on 10/22/2020.  Sequence of events as below. Significant Hospital Events: Including procedures, antibiotic start and stop dates in addition to other pertinent events   9/8 - Presented to [facility] with abdominal pain. CT A/P with extensive pneumoperitoneum 9/9 - Ex-lap (no colonic perforation discovered, large necrotic fluid present surrounding pancreas. Transferred to ICU post-op, intubated and sedated.  9/10-received 1 unit PRBC for hemoglobin 6.7, went back on Levophed for short period of time 9/12 - Extubated to Venturi mask 9/13 - Off Fentanyl and Propofol gtt 10/28/20.  Patient transferred under [facility].  Assessment & Plan:   Principal Problem:   Severe sepsis (HCC) Active Problems:   Acute necrotizing pancreatitis   Toxic encephalopathy   Hyponatremia   Hypoalbuminemia   Macrocytic anemia   Pressure injury of skin   Protein-calorie malnutrition, severe   Infected Necrotizing Pancreatitis with Pneumoperitoneum s/p ex-lap by Dr. Drexel Iha on 10/22/2020: - General surgery following; appreciate their recommendations.  - Discontinued Linezolid, as he has completed course.  -  Continue TPN.  Also has NG tube but due to vomiting, tube feedings were held. Continues to have fever, had 1-2.6 at 3:30 AM today.  CT abdomen repeated 10/30/2020 by general surgery shows large amount of residual gas and fluid dissecting through the retroperitoneum, no evidence of enteric fistula or leak.  Patient remains on antibiotics and supportive therapy, Finally it appears that fever has subsided. Management per general surgery.  Blood culture remain negative.  No leukocytosis.  Procalcitonin was also improving.     Acute respiratory failure with hypoxia/possible aspiration pneumonia/congestive heart failure/left pleural effusion: Intubated 10/22/2020 and extubated 10/26/2020.   Repeat chest x-ray 10/28/2020 shows worsening/increased left lung opacity concerning for pneumonia or atelectasis and associated left pleural effusion.  Antibiotics transitioned to Merrem.  Repeat procalcitonin improving.  Patient's respiratory status and lung exam is also improving while he is on Lasix.  However repeat chest x-ray on 10/31/2020 shows improved left pleural effusion but increased right lower base opacity. Patient grew stenotrophomonas maltophilia in the sputum culture, antibiotics transitioned to Levaquin and Flagyl on 11/01/2020 per pharmacy recommendations.  Patient's respiratory status improved, he was off of oxygen but currently back on 4 L.  Repeat x-ray shows a stable atelectasis/infiltrates.  Encouraged incentive spirometry.  Continue antibiotics.  Still seems to have some pleural effusion on the left base, will continue Lasix 40 mg IV twice daily for 1-2 more days   Acute toxic-metabolic encephalopathy: Sepsis +/- ICU delirium: He is more alert and oriented today.  Maintain delirium precautions.  Ileus: Resolving.  Managed by general surgery.   Acute hepatitis: Stable. 2/2 to TPN - CMP daily    Alcohol abuse: Out of the window for withdrawal complications.   -  Continue thiamine and folate   Acute Blood  Loss Anemia, postsurgical: S/p 1 unit of pRBCs on 9/10 with stabilization in hemoglobin since.  - Monitor H&H and transfuse if less than 7   Severe protein calorie malnutrition - Continue TPN   Diabetes type 2 with hyperglycemia: Hemoglobin A1c was 6.8% on admission.  Blood sugar elevated.  Hyperglycemia managed by pharmacy with insulin in TPN.   Hypokalemia: Resolved.  Hypophosphatemia: Replenished.  Dysphagia: Assessed by SLP.  Per SLP, patient is cleared to swallow but general surgery plans to keep him n.p.o. with jejunal tube feeds for now.  Defer to general surgery.   DVT prophylaxis: heparin injection 5,000 Units Start: 10/22/20 1400 Place and maintain sequential compression device Start: 10-22-2020 2202 SCDs Start: 10/22/20 2145   Code Status: Full Code  Family Communication: None at bedside Status is: Inpatient  Remains inpatient appropriate because:Inpatient level of care appropriate due to severity of illness  Dispo: The patient is from: Home              Anticipated d/c is to: [facility]              Patient currently is not medically stable to d/c.   Difficult to place patient No   Estimated body mass index is 32.62 kg/m as calculated from the following:   Height as of this encounter: 5\' 9"  (1.753 m).   Weight as of this encounter: 100.2 kg.  Nutritional Assessment: Body mass index is 32.62 kg/m. Seen by dietician.  I agree with the assessment and plan as outlined below: Nutrition Status: Nutrition Problem: Severe Malnutrition Etiology: acute illness (infected necrotic pancreatitis) Signs/Symptoms: moderate fat depletion, moderate muscle depletion, energy intake < or equal to 50% for > or equal to 5 days, percent weight loss (8.4% weight loss in 1.5 months) Percent weight loss: 8.4 % (1.5 months) Interventions: TPN, Tube feeding, Refer to RD note for recommendations   Skin Assessment: I have examined the patient's skin and I agree with the wound assessment as  performed by the wound care RN as outlined below:   Consultants:  General surgery  Procedures:  As above  Antimicrobials:  Anti-infectives (From admission, onward)    Start     Dose/Rate Route Frequency Ordered Stop   11/01/20 0900  levofloxacin (LEVAQUIN) IVPB 750 mg        750 mg 100 mL/hr over 90 Minutes Intravenous Every 24 hours 11/01/20 0824     11/01/20 0900  metroNIDAZOLE (FLAGYL) IVPB 500 mg        500 mg 100 mL/hr over 60 Minutes Intravenous Every 12 hours 11/01/20 0824     10/29/20 2200  meropenem (MERREM) 1 g in sodium chloride 0.9 % 100 mL IVPB  Status:  Discontinued        1 g 200 mL/hr over 30 Minutes Intravenous Every 8 hours 10/29/20 1428 11/01/20 0824   10/28/20 1615  meropenem (MERREM) 2 g in sodium chloride 0.9 % 100 mL IVPB  Status:  Discontinued        2 g 200 mL/hr over 30 Minutes Intravenous Every 8 hours 10/28/20 1515 10/29/20 1428   10/28/20 1445  metroNIDAZOLE (FLAGYL) IVPB 500 mg  Status:  Discontinued        500 mg 100 mL/hr over 60 Minutes Intravenous Every 12 hours 10/28/20 1348 10/28/20 1505   10/25/20 2300  fluconazole (DIFLUCAN) IVPB 400 mg        400 mg 100 mL/hr over 120 Minutes Intravenous  Every 24 hours 10/25/20 0742 10/29/20 0718   10/24/20 2300  fluconazole (DIFLUCAN) IVPB 200 mg  Status:  Discontinued        200 mg 100 mL/hr over 60 Minutes Intravenous Every 24 hours 10/24/20 0943 10/25/20 0742   10/22/20 1800  vancomycin (VANCOREADY) IVPB 1500 mg/300 mL  Status:  Discontinued        1,500 mg 150 mL/hr over 120 Minutes Intravenous Every 24 hours 10/16/2020 2225 10/22/20 0759   10/22/20 0900  linezolid (ZYVOX) IVPB 600 mg  Status:  Discontinued        600 mg 300 mL/hr over 60 Minutes Intravenous Every 12 hours 10/22/20 0800 10/27/20 0852   10/22/20 0300  piperacillin-tazobactam (ZOSYN) IVPB 3.375 g  Status:  Discontinued        3.375 g 12.5 mL/hr over 240 Minutes Intravenous Every 8 hours 10/30/2020 2225 10/28/20 1514   11/06/2020 2335   metroNIDAZOLE (FLAGYL) IVPB 500 mg  Status:  Discontinued        500 mg 100 mL/hr over 60 Minutes Intravenous Every 12 hours 10/15/2020 2142 10/22/20 0747   10/24/2020 2248  fluconazole (DIFLUCAN) IVPB 400 mg  Status:  Discontinued        400 mg 100 mL/hr over 120 Minutes Intravenous Every 24 hours 11/12/2020 2142 10/24/20 0943   10/15/2020 1748  vancomycin (VANCOREADY) IVPB 2000 mg/400 mL        2,000 mg 200 mL/hr over 120 Minutes Intravenous  Once 11/10/2020 1706 10/18/2020 2113   11/03/2020 1715  piperacillin-tazobactam (ZOSYN) IVPB 3.375 g        3.375 g 100 mL/hr over 30 Minutes Intravenous  Once 11/05/2020 1706 10/29/2020 1903          Subjective: Seen and examined.  Denies any abdominal pain or shortness of breath.  Fully alert and oriented.  Physically very weak.  Objective: Vitals:   11/05/20 0344 11/05/20 0756 11/05/20 0800 11/05/20 1110  BP: 132/78  133/75 134/80  Pulse: (!) 107 (!) 102 (!) 102 (!) 107  Resp:  20 16 20   Temp: 99 F (37.2 C)  98.3 F (36.8 C) 99 F (37.2 C)  TempSrc: Oral  Oral Oral  SpO2: 93% 92% 93% 93%  Weight:      Height:        Intake/Output Summary (Last 24 hours) at 11/05/2020 1349 Last data filed at 11/05/2020 0600 Gross per 24 hour  Intake 10 ml  Output 115 ml  Net -105 ml    Filed Weights   11/02/20 0444 11/03/20 0346 11/04/20 0603  Weight: 98 kg 98.1 kg 100.2 kg    Examination: General exam: Appears calm and comfortable  Respiratory system: Clear to auscultation. Respiratory effort normal. Cardiovascular system: S1 & S2 heard, RRR. No JVD, murmurs, rubs, gallops or clicks. No pedal edema. Gastrointestinal system: Abdomen is nondistended, soft and nontender. No organomegaly or masses felt. Normal bowel sounds heard. Central nervous system: Alert and oriented. No focal neurological deficits. Extremities: Symmetric 5 x 5 power. Skin: No rashes, lesions or ulcers.  Psychiatry: Judgement and insight appear normal. Mood & affect appropriate.    Data Reviewed: I have personally reviewed following labs and imaging studies  CBC: Recent Labs  Lab 10/30/20 0328 10/31/20 0352 11/02/20 0925 11/04/20 0847 11/05/20 0345  WBC 9.8 11.1* 9.4 8.2 8.6  NEUTROABS 7.3 8.0* 7.8* 6.4  --   HGB 8.3* 8.4* 8.6* 7.8* 8.0*  HCT 26.9* 27.0* 27.3* 25.1* 25.0*  MCV 119.6* 107.6* 105.8* 104.6* 104.2*  PLT 104* 112* 159 191 215    Basic Metabolic Panel: Recent Labs  Lab 10/30/20 0503 10/31/20 0352 11/01/20 0358 11/02/20 0925 11/04/20 0847  NA 143 144 142 136 137  K 3.9 4.2 4.2 4.0 4.0  CL 107 107 99 101 104  CO2 GLUCOSE 174* 129* 212* 230* 236*  BUN CREATININE 0.52* 0.61 0.59* 0.61 0.58*  CALCIUM 8.6* 8.6* 8.7* 8.6* 8.2*  MG  --   --  2.0  --  1.9  PHOS 2.6  --  2.9  --  3.1    GFR: Estimated Creatinine Clearance: 126 mL/min (A) (by C-G formula based on SCr of 0.58 mg/dL (L)). Liver Function Tests: Recent Labs  Lab 10/30/20 0503 10/31/20 0352 11/01/20 0358 11/04/20 0847  AST 28 31 33 23  ALT ALKPHOS 119 94 89 76  BILITOT 0.8 0.8 0.7 0.5  PROT 5.4* 5.6* 5.7* 5.6*  ALBUMIN 1.5* 1.5* 1.5* 1.4*    No results for input(s): LIPASE, AMYLASE in the last 168 hours.  No results for input(s): AMMONIA in the last 168 hours.  Coagulation Profile: No results for input(s): INR, PROTIME in the last 168 hours.  Cardiac Enzymes: No results for input(s): CKTOTAL, CKMB, CKMBINDEX, TROPONINI in the last 168 hours.  BNP (last 3 results) No results for input(s): PROBNP in the last 8760 hours. HbA1C: No results for input(s): HGBA1C in the last 72 hours. CBG: Recent Labs  Lab 11/04/20 2022 11/04/20 2349 11/05/20 0351 11/05/20 0814 11/05/20 1104  GLUCAP 211* 184* 171* 185* 192*    Lipid Profile: No results for input(s): CHOL, HDL, LDLCALC, TRIG, CHOLHDL, LDLDIRECT in the last 72 hours.  Thyroid Function Tests: No results for input(s): TSH, T4TOTAL, FREET4, T3FREE, THYROIDAB in  the last 72 hours. Anemia Panel: No results for input(s): VITAMINB12, FOLATE, FERRITIN, TIBC, IRON, RETICCTPCT in the last 72 hours. Sepsis Labs: Recent Labs  Lab 10/31/20 0937 11/04/20 0847  PROCALCITON 1.12 1.55     Recent Results (from the past 240 hour(s))  Expectorated Sputum Assessment w Gram Stain, Rflx to Resp Cult     Status: None   Collection Time: 10/28/20  2:57 PM   Specimen: Expectorated Sputum  Result Value Ref Range Status   Specimen Description EXPECTORATED SPUTUM  Final   Special Requests NONE  Final   Sputum evaluation   Final    THIS SPECIMEN IS ACCEPTABLE FOR SPUTUM CULTURE Performed at [facility], [address], [place], [place] [address]    Report Status 10/30/2020 FINAL  Final  Culture, Respiratory w Gram Stain     Status: None   Collection Time: 10/28/20  2:57 PM  Result Value Ref Range Status   Specimen Description EXPECTORATED SPUTUM  Final   Special Requests NONE Reflexed from [id number]  Final   Gram Stain   Final    FEW SQUAMOUS EPITHELIAL CELLS PRESENT MODERATE WBC PRESENT,BOTH PMN AND MONONUCLEAR ABUNDANT GRAM NEGATIVE RODS Performed at [facility], [address] [place], [place] [address]    Culture MODERATE STENOTROPHOMONAS MALTOPHILIA  Final   Report Status 11/01/2020 FINAL  Final   Organism ID, Bacteria STENOTROPHOMONAS MALTOPHILIA  Final      Susceptibility   Stenotrophomonas maltophilia - MIC*    LEVOFLOXACIN 0.5 SENSITIVE Sensitive     TRIMETH/SULFA <=20 SENSITIVE Sensitive     * MODERATE STENOTROPHOMONAS MALTOPHILIA  Culture, blood (Routine X 2)  w Reflex to ID Panel     Status: None (Preliminary result)   Collection Time: 11/03/20 11:23 AM   Specimen: BLOOD  Result Value Ref Range Status   Specimen Description BLOOD LEFT ANTECUBITAL  Final   Special Requests   Final    BOTTLES DRAWN AEROBIC ONLY Blood Culture adequate volume   Culture   Final    NO GROWTH < 24 HOURS Performed at [facility], [address] [place], [place] [address]    Report Status PENDING  Incomplete  Culture, blood (Routine X 2) w Reflex to ID Panel     Status: None (Preliminary result)   Collection Time: 11/03/20 11:24 AM   Specimen: BLOOD LEFT HAND  Result Value Ref Range Status   Specimen Description BLOOD LEFT HAND  Final   Special Requests   Final    BOTTLES DRAWN AEROBIC ONLY Blood Culture adequate volume   Culture   Final    NO GROWTH < 24 HOURS Performed at [facility], [address], [place], [place] [address]    Report Status PENDING  Incomplete       Radiology Studies: DG CHEST PORT 1 VIEW  Result Date: 11/04/2020 CLINICAL DATA:  Hypoxia and altered mental status. EXAM: PORTABLE CHEST 1 VIEW COMPARISON:  October 31, 2020 FINDINGS: There is stable right-sided PICC line and nasogastric tube positioning. Mild, stable bibasilar atelectasis and/or infiltrate is seen. Small bilateral pleural effusions are noted, right greater than left. This is mildly increased in size on the right when compared to the prior study. No pneumothorax is identified the heart size and mediastinal contours are within normal limits. The visualized skeletal structures are unremarkable. IMPRESSION: 1. Mild, stable bibasilar atelectasis and/or infiltrate. 2. Small bilateral pleural effusions, right greater than left. This is mildly increased in size on the right when compared to the prior study. Electronically Signed   By: Aram Candela M.D.   On: 11/04/2020 15:21   VAS [place] LOWER EXTREMITY VENOUS (DVT)  Result Date: 11/05/2020  Lower Venous DVT Study Patient Name:  Paul Chambers  Date of Exam:   11/05/2020 Medical Rec #: [id number]     Accession #:    [id number] Date of Birth: 1965/08/09    Patient Gender: M Patient Age:   39 years Exam Location:  [facility] Procedure:      VAS [place] LOWER EXTREMITY VENOUS (DVT) Referring Phys: MICHAEL MACZIS --------------------------------------------------------------------------------   Indications: Edema.  Comparison Study: no prior Performing Technologist: [place] Ponder RVS  Examination Guidelines: A complete evaluation includes B-mode imaging, spectral Doppler, color Doppler, and power Doppler as needed of all accessible portions of each vessel. Bilateral testing is considered an integral part of a complete examination. Limited examinations for reoccurring indications may be performed as noted. The reflux portion of the exam is performed with the patient in reverse Trendelenburg.  +---------+---------------+---------+-----------+----------+--------------+ RIGHT    CompressibilityPhasicitySpontaneityPropertiesThrombus Aging +---------+---------------+---------+-----------+----------+--------------+ CFV      Full           Yes      Yes                                 +---------+---------------+---------+-----------+----------+--------------+ SFJ      Full                                                        +---------+---------------+---------+-----------+----------+--------------+  FV Prox  Full                                                        +---------+---------------+---------+-----------+----------+--------------+ FV Mid   Full                                                        +---------+---------------+---------+-----------+----------+--------------+ FV DistalFull                                                        +---------+---------------+---------+-----------+----------+--------------+ PFV      Full                                                        +---------+---------------+---------+-----------+----------+--------------+ POP      Full           Yes      Yes                                 +---------+---------------+---------+-----------+----------+--------------+ PTV      Full                                                         +---------+---------------+---------+-----------+----------+--------------+ PERO     Full                                                        +---------+---------------+---------+-----------+----------+--------------+   +---------+---------------+---------+-----------+----------+--------------+ LEFT     CompressibilityPhasicitySpontaneityPropertiesThrombus Aging +---------+---------------+---------+-----------+----------+--------------+ CFV      Full           Yes      Yes                                 +---------+---------------+---------+-----------+----------+--------------+ SFJ      Full                                                        +---------+---------------+---------+-----------+----------+--------------+ FV Prox  Full                                                        +---------+---------------+---------+-----------+----------+--------------+  FV Mid   Full                                                        +---------+---------------+---------+-----------+----------+--------------+ FV DistalFull                                                        +---------+---------------+---------+-----------+----------+--------------+ PFV      Full                                                        +---------+---------------+---------+-----------+----------+--------------+ POP      Full           Yes      Yes                                 +---------+---------------+---------+-----------+----------+--------------+ PTV      Full                                                        +---------+---------------+---------+-----------+----------+--------------+ PERO     Full                                                        +---------+---------------+---------+-----------+----------+--------------+    Summary: BILATERAL: - No evidence of deep vein thrombosis seen in the lower extremities, bilaterally. -No evidence of  popliteal cyst, bilaterally.   *See table(s) above for measurements and observations.    Preliminary     Scheduled Meds:  chlorhexidine  15 mL Mouth Rinse BID   Chlorhexidine Gluconate Cloth  6 each Topical Q0600   feeding supplement (PROSource TF)  45 mL Per Tube TID   fluticasone furoate-vilanterol  1 puff Inhalation Daily   And   umeclidinium bromide  1 puff Inhalation Daily   furosemide  40 mg Intravenous BID   heparin  5,000 Units Subcutaneous Q8H   insulin aspart  0-20 Units Subcutaneous Q4H   insulin aspart  6 Units Subcutaneous Q4H   mouth rinse  15 mL Mouth Rinse q12n4p   pantoprazole (PROTONIX) IV  40 mg Intravenous Q24H   sodium chloride flush  10-40 mL Intracatheter Q12H   sodium chloride flush  10-40 mL Intracatheter Q12H   Continuous Infusions:  sodium chloride Stopped (10/31/20 0131)   feeding supplement (VITAL 1.5 CAL) 1,000 mL (11/05/20 0947)   levofloxacin (LEVAQUIN) IV 750 mg (11/05/20 1135)   metronidazole 500 mg (11/05/20 0928)   TPN ADULT (ION) 100 mL/hr at 11/04/20 1730   TPN ADULT (ION)       LOS: 15 days   Time spent: 28 minutes  Hughie Closs, MD [facility]  11/05/2020, 1:49 PM  Please page via Amion and do not message via secure chat for anything urgent. Secure chat can be used for anything non urgent and I will respond at my earliest availability.  How to contact the [facility] Attending or Consulting provider 7A - 7P or covering provider during after hours 7P -7A, for this patient?  Check the care team in [facility] and look for a) attending/consulting [facility] provider listed and b) the [facility] team listed. Page or secure chat 7A-7P. Log into [email or web address] and use [place]'s universal password to access. If you do not have the password, please contact the hospital operator. Locate the [facility] provider you are looking for under [facility] and page to a number that you can be directly reached. If you still have difficulty reaching the provider, please page  the [facility] (Director on Call) for the Hospitalists listed on amion for assistance.

## 2020-11-05 NOTE — Progress Notes (Signed)
Paul Chambers notified pt is a yellow MEWS and needs VS at 02 and 04. Voiced understanding

## 2020-11-06 ENCOUNTER — Inpatient Hospital Stay (HOSPITAL_COMMUNITY): Payer: BC Managed Care – PPO

## 2020-11-06 DIAGNOSIS — E8809 Other disorders of plasma-protein metabolism, not elsewhere classified: Secondary | ICD-10-CM | POA: Diagnosis not present

## 2020-11-06 DIAGNOSIS — K668 Other specified disorders of peritoneum: Secondary | ICD-10-CM

## 2020-11-06 DIAGNOSIS — D539 Nutritional anemia, unspecified: Secondary | ICD-10-CM | POA: Diagnosis not present

## 2020-11-06 DIAGNOSIS — R410 Disorientation, unspecified: Secondary | ICD-10-CM

## 2020-11-06 DIAGNOSIS — K651 Peritoneal abscess: Secondary | ICD-10-CM | POA: Diagnosis not present

## 2020-11-06 DIAGNOSIS — G9341 Metabolic encephalopathy: Secondary | ICD-10-CM

## 2020-11-06 DIAGNOSIS — R6520 Severe sepsis without septic shock: Secondary | ICD-10-CM | POA: Diagnosis not present

## 2020-11-06 DIAGNOSIS — R188 Other ascites: Secondary | ICD-10-CM

## 2020-11-06 DIAGNOSIS — Z9889 Other specified postprocedural states: Secondary | ICD-10-CM

## 2020-11-06 DIAGNOSIS — Z789 Other specified health status: Secondary | ICD-10-CM

## 2020-11-06 DIAGNOSIS — K8591 Acute pancreatitis with uninfected necrosis, unspecified: Secondary | ICD-10-CM | POA: Diagnosis not present

## 2020-11-06 DIAGNOSIS — J690 Pneumonitis due to inhalation of food and vomit: Secondary | ICD-10-CM

## 2020-11-06 DIAGNOSIS — J9811 Atelectasis: Secondary | ICD-10-CM

## 2020-11-06 DIAGNOSIS — A419 Sepsis, unspecified organism: Secondary | ICD-10-CM | POA: Diagnosis not present

## 2020-11-06 DIAGNOSIS — E1165 Type 2 diabetes mellitus with hyperglycemia: Secondary | ICD-10-CM

## 2020-11-06 LAB — CBC WITH DIFFERENTIAL/PLATELET
Abs Immature Granulocytes: 0 10*3/uL (ref 0.00–0.07)
Basophils Absolute: 0 10*3/uL (ref 0.0–0.1)
Basophils Relative: 0 %
Eosinophils Absolute: 0.2 10*3/uL (ref 0.0–0.5)
Eosinophils Relative: 2 %
HCT: 27.1 % — ABNORMAL LOW (ref 39.0–52.0)
Hemoglobin: 8.5 g/dL — ABNORMAL LOW (ref 13.0–17.0)
Lymphocytes Relative: 16 %
Lymphs Abs: 1.7 10*3/uL (ref 0.7–4.0)
MCH: 33.1 pg (ref 26.0–34.0)
MCHC: 31.4 g/dL (ref 30.0–36.0)
MCV: 105.4 fL — ABNORMAL HIGH (ref 80.0–100.0)
Monocytes Absolute: 0.7 10*3/uL (ref 0.1–1.0)
Monocytes Relative: 7 %
Neutro Abs: 8 10*3/uL — ABNORMAL HIGH (ref 1.7–7.7)
Neutrophils Relative %: 75 %
Platelets: 235 10*3/uL (ref 150–400)
RBC: 2.57 MIL/uL — ABNORMAL LOW (ref 4.22–5.81)
RDW: 18 % — ABNORMAL HIGH (ref 11.5–15.5)
WBC: 10.7 10*3/uL — ABNORMAL HIGH (ref 4.0–10.5)
nRBC: 2 % — ABNORMAL HIGH (ref 0.0–0.2)
nRBC: 2 /100 WBC — ABNORMAL HIGH

## 2020-11-06 LAB — RENAL FUNCTION PANEL
Albumin: 1.3 g/dL — ABNORMAL LOW (ref 3.5–5.0)
Anion gap: 7 (ref 5–15)
BUN: 17 mg/dL (ref 6–20)
CO2: 27 mmol/L (ref 22–32)
Calcium: 7.9 mg/dL — ABNORMAL LOW (ref 8.9–10.3)
Chloride: 105 mmol/L (ref 98–111)
Creatinine, Ser: 0.51 mg/dL — ABNORMAL LOW (ref 0.61–1.24)
GFR, Estimated: >60 mL/min (ref >60)
Glucose, Bld: 215 mg/dL — ABNORMAL HIGH (ref 70–99)
Phosphorus: 2.9 mg/dL (ref 2.5–4.6)
Potassium: 3.8 mmol/L (ref 3.5–5.1)
Sodium: 139 mmol/L (ref 135–145)

## 2020-11-06 LAB — GLUCOSE, CAPILLARY
Glucose-Capillary: 144 mg/dL — ABNORMAL HIGH (ref 70–99)
Glucose-Capillary: 192 mg/dL — ABNORMAL HIGH (ref 70–99)
Glucose-Capillary: 207 mg/dL — ABNORMAL HIGH (ref 70–99)
Glucose-Capillary: 212 mg/dL — ABNORMAL HIGH (ref 70–99)
Glucose-Capillary: 256 mg/dL — ABNORMAL HIGH (ref 70–99)
Glucose-Capillary: 268 mg/dL — ABNORMAL HIGH (ref 70–99)

## 2020-11-06 LAB — HEPATIC FUNCTION PANEL
ALT: 16 U/L (ref 0–44)
AST: 26 U/L (ref 15–41)
Albumin: 1.3 g/dL — ABNORMAL LOW (ref 3.5–5.0)
Alkaline Phosphatase: 89 U/L (ref 38–126)
Bilirubin, Direct: 0.2 mg/dL (ref 0.0–0.2)
Indirect Bilirubin: 0.4 mg/dL (ref 0.3–0.9)
Total Bilirubin: 0.6 mg/dL (ref 0.3–1.2)
Total Protein: 5.9 g/dL — ABNORMAL LOW (ref 6.5–8.1)

## 2020-11-06 MED ORDER — INSULIN ASPART 100 UNIT/ML IJ SOLN
9.0000 [IU] | INTRAMUSCULAR | Status: DC
  Administered 2020-11-06 – 2020-11-07 (×5): 9 [IU] via SUBCUTANEOUS

## 2020-11-06 MED ORDER — INSULIN GLARGINE-YFGN 100 UNIT/ML ~~LOC~~ SOLN
15.0000 [IU] | Freq: Every day | SUBCUTANEOUS | Status: DC
  Administered 2020-11-06 – 2020-11-07 (×2): 15 [IU] via SUBCUTANEOUS
  Filled 2020-11-06 (×2): qty 0.15

## 2020-11-06 MED ORDER — FOLIC ACID 1 MG PO TABS
1.0000 mg | ORAL_TABLET | Freq: Every day | ORAL | Status: DC
  Administered 2020-11-07 – 2020-11-17 (×11): 1 mg
  Filled 2020-11-06 (×11): qty 1

## 2020-11-06 MED ORDER — PIPERACILLIN-TAZOBACTAM 3.375 G IVPB
3.3750 g | Freq: Three times a day (TID) | INTRAVENOUS | Status: DC
  Administered 2020-11-06 – 2020-11-09 (×9): 3.375 g via INTRAVENOUS
  Filled 2020-11-06 (×9): qty 50

## 2020-11-06 MED ORDER — THIAMINE HCL 100 MG PO TABS
100.0000 mg | ORAL_TABLET | Freq: Every day | ORAL | Status: DC
  Administered 2020-11-07 – 2020-11-17 (×11): 100 mg
  Filled 2020-11-06 (×11): qty 1

## 2020-11-06 MED ORDER — ALBUMIN HUMAN 25 % IV SOLN
25.0000 g | Freq: Four times a day (QID) | INTRAVENOUS | Status: AC
  Administered 2020-11-06 – 2020-11-07 (×3): 25 g via INTRAVENOUS
  Filled 2020-11-06 (×3): qty 100

## 2020-11-06 MED ORDER — IOHEXOL 300 MG/ML  SOLN
80.0000 mL | Freq: Once | INTRAMUSCULAR | Status: AC | PRN
  Administered 2020-11-06: 80 mL via INTRAVENOUS

## 2020-11-06 NOTE — Progress Notes (Signed)
Pt is alert and oriented x4 but is saying bizarre statements: "see my belly is really getting big and it wasn't that way until that new antibiotic and shot you just gave me (Heparin) was started. Did that shot put air into my belly?" Explained to him the shot was a bld thinner and it doesn't put air into your stomach. I told him the new antibiotic doesn't do that either it was started because of the results of your CT of your stomach today. I stated you have more air and fluid in your belly and that's why it is getting bigger.

## 2020-11-06 NOTE — Progress Notes (Addendum)
PROGRESS NOTE  Paul Chambers ZYY:[id number] DOB: 11-Oct-1965   PCP: Pcp, No  Patient is from: Home  DOA: 11/03/2020 LOS: 16  Chief complaints:  Chief Complaint  Patient presents with   Altered Mental Status     Brief Narrative / Interim history: 55 year old M with PMH of DM-2, EtOH abuse, pancreatitis and tobacco use presenting with altered mental status and abdominal pain.  Per wife, started presentation to healthcare facilities ([facility], [facility] and finally [facility]) with abdominal pain.  In ED, CTH negative.  CT abdomen and pelvis raise concern for pneumoperitoneum without an obvious location of perforation.  He was started on vancomycin and Zosyn.  General surgery consulted.  He underwent ex lap on 9/9 and found to have large necrotic fluid surrounding pancreas concerning for necrotic pancreatitis but no bowel perforation.  He had 2 drains placed.  No fluid culture sent.  He was transferred to ICU postop.  He was extubated on 9/12 to Venturi mask.  Came off sedation on 9/13 and transferred to [facility] service on 9/15.  Hospital course complicated by ARF in the setting of possible aspiration pneumonia, exudative left pleural effusion, acute blood loss anemia requiring transfusion, acute metabolic encephalopathy/delirium and severe malnutrition from prolonged n.p.o. requiring TPN initiation.  Patient is on IV Zosyn and vancomycin 9/8-9/15, IV meropenem 9/15-9/19, and Levaquin and Flagyl 9/19 to date.   Lately, general surgery weaning of TPN to TF with postpyloric core track.  Still with intermittent fever and tachycardia despite antibiotics.  Infectious disease consulted on 9/24.  General surgery following.   Subjective: Seen and examined earlier this morning.  Patient had an episode of what looks like delirium overnight.  Also some drainage around the JP drains.  Reportedly had a course of breath requiring breathing treatments.  Also spiked fever to 101.1 yesterday evening.  Slightly  tachycardic.  CXR with pulmonary hyperinflation, small bilateral pleural effusions and associated retrocardiac collapse and consolidation.  Patient reports mild headache and shortness of breath.  Currently saturating in mid 90s on 4 L by [place].  Very poor inspiratory effort on incentive spirometry.  He denies chest pain.  Tolerating tube feed.  Also started clear liquid diet.  He had about 5 bowel movements charted in the last 24 hours likely from tube feed.  Patient's wife at bedside.  Objective: Vitals:   11/06/20 0400 11/06/20 0727 11/06/20 0815 11/06/20 1108  BP: 135/82 116/81  127/80  Pulse: (!) 112 (!) 117 (!) 109 (!) 110  Resp: (!) 24 (!) 22  (!) 22  Temp: 98.4 F (36.9 C) 98.4 F (36.9 C)  99.6 F (37.6 C)  TempSrc: Oral Oral  Oral  SpO2: 93% 94%  95%  Weight:      Height:        Intake/Output Summary (Last 24 hours) at 11/06/2020 1156 Last data filed at 11/06/2020 0957 Gross per 24 hour  Intake 100 ml  Output 1550 ml  Net -1450 ml   Filed Weights   11/02/20 0444 11/03/20 0346 11/04/20 0603  Weight: 98 kg 98.1 kg 100.2 kg    Examination:  GENERAL: No apparent distress.  Nontoxic. HEENT: MMM.  Vision and hearing grossly intact.  NECK: Supple.  No apparent JVD.  RESP: 95% on 4 L by [place].  No IWOB.  Rhonchi bilaterally.  About 300 cc on IS CVS:  RRR. Heart sounds normal.  ABD/GI/GU: BS+. Abd full.  Diffuse tenderness.  Dressing over midline incision DCI.  Drains in place, brown fluid in bulbs.  MSK/EXT:  Moves extremities. No apparent deformity.  1+ pitting edema in BLEs. SKIN: no apparent skin lesion or wound NEURO: Awake and alert.  Oriented x4.  Fair insight.  Some difficulty understanding instructions.  No apparent focal neuro deficit. PSYCH: Calm. Normal affect.   Procedures:  9/9-ex lap with drain placement by Dr. Dr. Sheliah Hatch 9/9-9/13-intubation and mechanical ventilation  Microbiology summarized: 9/8-COVID-19 and influenza PCR nonreactive. 9/9-MRSA PCR screen  negative. 9/8 and 9/9-blood cultures NGTD 9/15-sputum culture with STENOTROPHOMONAS MALTOPHILIA sensitive to Levaquin and Bactrim 9/21-blood cultures NGTD.  Assessment & Plan: Sepsis due to infected necrotizing pancreatitis with pneumoperitoneum and possible aspiration pneumonia -Manage individual problems as below.    Infected necrotizing pancreatitis with pneumoperitoneum and retroperitoneal fluid collection: S/p ex-lap and drain placement by Dr. Drexel Iha on 9/9.  Culture data as above.  Unfortunately, no fluid culture to guide antibiotics from surgical drain -Repeat CT a/p  on 9/17 with large amount of residual gas and fluid dissecting through the retroperitoneum -9/8 >>Vanco and Zosyn >>9/9>> Zyvox and Zosyn>> 9/14 >meropenem>> 9/19>> Levaquin and Flagyl -Ex lap wound care per general surgery. -Weaning of TPN to TF via postpyloric cortrack.  Also on clear liquid diet. -IS/OOB/PT/OT -ID consulted for guidance on antibiotics   Acute respiratory failure with hypoxia/possible aspiration pneumonia/atelectasis: Recent chest x-ray with hypoinflation and retrocardiac opacity.  Still spiking fever but fever curve downtrending.  No leukocytosis. -ETT postop 9/9-10/2011.  -Antibiotic course as above.  -Aggressive pulmonary toileting, IS, OOB, PT/OT.  Has very poor inspiratory effort on IS. -ID consulted for guidance for antibiotic  Anasarca/edema-likely due to malnutrition and hypoalbuminemia.  BNP 160.  TTE on 9/15 basically normal. -Continue IV Lasix 40 mg twice daily -Add albumin to mobilize fluid intravascularly. -Closely monitor fluid status, renal functions and electrolytes  Acute toxic-metabolic encephalopathy/delirium: Encephalopathy likely from severe sepsis and ICU delirium.  Patient was started on as needed Haldol last night.  He is awake and oriented x4.  Fair insight as well but the difficulty comprehending or following some instructions.   -Treat treatable causes  -Reorientation  and delirium precautions.   -Fall and aspiration precautions    Ileus: Seems to have resolved.  5 bowel movements after starting TF.  Diarrhea: Likely from TF.  Low suspicion for C. difficile.  However, he is at risk for C. difficile due to prolonged IV antibiotics   Acute hepatitis: Likely from TPN.  Expect improvement now that he is coming off TPN. -Monitor   Alcohol abuse: Out of the window for withdrawal complications.   -Continue thiamine and folate   Acute Blood Loss Anemia, postsurgical: S/p 1 unit of pRBCs on 9/10 with stabilization in hemoglobin since.  Recent Labs    10/26/20 1056 10/26/20 1811 10/27/20 0500 10/28/20 0212 10/29/20 1008 10/30/20 0328 10/31/20 0352 11/02/20 0925 11/04/20 0847 11/05/20 0345  HGB 6.5* 9.2* 8.2* 8.6* 8.6* 8.3* 8.4* 8.6* 7.8* 8.0*  -Continue monitoring  Controlled DM-2 with hyperglycemia: A1c 6.8% on admission. Recent Labs  Lab 11/05/20 2147 11/06/20 0036 11/06/20 0348 11/06/20 0739 11/06/20 1128  GLUCAP 228* 212* 207* 192* 256*  -Add Lantus 15 units daily -Continue SSI-high every 4 hours -Continue TF coverage 9 units every 4 hours -Further adjustment as appropriate   Hypokalemia/hypomagnesemia/hypophosphatemia: Resolved.   Dysphagia: Cleared by SLP -General surgery started clear liquid diet -Continue TF and TPN as above  Debility/physical deconditioning  Severe protein calorie malnutrition in the setting of n.p.o. as evidenced by anasarca, hypoalbuminemia -Tube feed and TPN as above. Body  mass index is 32.62 kg/m. Nutrition Problem: Severe Malnutrition Etiology: acute illness (infected necrotic pancreatitis) Signs/Symptoms: moderate fat depletion, moderate muscle depletion, energy intake < or equal to 50% for > or equal to 5 days, percent weight loss (8.4% weight loss in 1.5 months) Percent weight loss: 8.4 % (1.5 months) Interventions: TPN, Tube feeding, Refer to RD note for recommendations   DVT prophylaxis:   heparin injection 5,000 Units Start: 10/22/20 1400 Place and maintain sequential compression device Start: 10/14/2020 2202 SCDs Start: 11/05/2020 2145  Code Status: Full code Family Communication: Updated patient's wife at bedside. Level of care: Progressive Status is: Inpatient  Remains inpatient appropriate because:Hemodynamically unstable, Ongoing active pain requiring inpatient pain management, Ongoing diagnostic testing needed not appropriate for outpatient work up, IV treatments appropriate due to intensity of illness or inability to take PO, and Inpatient level of care appropriate due to severity of illness  Dispo: The patient is from: Home              Anticipated d/c is to: [facility]              Patient currently is not medically stable to d/c.   Difficult to place patient No       Consultants:  General surgery Infectious disease   Sch Meds:  Scheduled Meds:  chlorhexidine  15 mL Mouth Rinse BID   Chlorhexidine Gluconate Cloth  6 each Topical Q0600   feeding supplement (PROSource TF)  45 mL Per Tube TID   fluticasone furoate-vilanterol  1 puff Inhalation Daily   And   umeclidinium bromide  1 puff Inhalation Daily   [START ON 11/07/2020] folic acid  1 mg Per Tube Daily   furosemide  40 mg Intravenous BID   heparin  5,000 Units Subcutaneous Q8H   insulin aspart  0-20 Units Subcutaneous Q4H   insulin aspart  9 Units Subcutaneous Q4H   mouth rinse  15 mL Mouth Rinse q12n4p   pantoprazole (PROTONIX) IV  40 mg Intravenous Q24H   sodium chloride flush  10-40 mL Intracatheter Q12H   sodium chloride flush  10-40 mL Intracatheter Q12H   [START ON 11/07/2020] thiamine  100 mg Per Tube Daily   Continuous Infusions:  sodium chloride Stopped (10/31/20 0131)   feeding supplement (VITAL 1.5 CAL) 1,000 mL (11/06/20 1000)   levofloxacin (LEVAQUIN) IV 750 mg (11/06/20 0928)   metronidazole 500 mg (11/06/20 0915)   TPN ADULT (ION) 50 mL/hr at 11/05/20 1759   PRN Meds:.sodium chloride,  acetaminophen (TYLENOL) oral liquid 160 mg/5 mL, acetaminophen, albuterol, diazepam, docusate, haloperidol lactate, HYDROmorphone (DILAUDID) injection, ondansetron (ZOFRAN) IV, polyethylene glycol, sodium chloride flush, sodium chloride flush  Antimicrobials: Anti-infectives (From admission, onward)    Start     Dose/Rate Route Frequency Ordered Stop   11/01/20 0900  levofloxacin (LEVAQUIN) IVPB 750 mg        750 mg 100 mL/hr over 90 Minutes Intravenous Every 24 hours 11/01/20 0824     11/01/20 0900  metroNIDAZOLE (FLAGYL) IVPB 500 mg        500 mg 100 mL/hr over 60 Minutes Intravenous Every 12 hours 11/01/20 0824     10/29/20 2200  meropenem (MERREM) 1 g in sodium chloride 0.9 % 100 mL IVPB  Status:  Discontinued        1 g 200 mL/hr over 30 Minutes Intravenous Every 8 hours 10/29/20 1428 11/01/20 0824   10/28/20 1615  meropenem (MERREM) 2 g in sodium chloride 0.9 % 100 mL IVPB  Status:  Discontinued        2 g 200 mL/hr over 30 Minutes Intravenous Every 8 hours 10/28/20 1515 10/29/20 1428   10/28/20 1445  metroNIDAZOLE (FLAGYL) IVPB 500 mg  Status:  Discontinued        500 mg 100 mL/hr over 60 Minutes Intravenous Every 12 hours 10/28/20 1348 10/28/20 1505   10/25/20 2300  fluconazole (DIFLUCAN) IVPB 400 mg        400 mg 100 mL/hr over 120 Minutes Intravenous Every 24 hours 10/25/20 0742 10/29/20 0718   10/24/20 2300  fluconazole (DIFLUCAN) IVPB 200 mg  Status:  Discontinued        200 mg 100 mL/hr over 60 Minutes Intravenous Every 24 hours 10/24/20 0943 10/25/20 0742   10/22/20 1800  vancomycin (VANCOREADY) IVPB 1500 mg/300 mL  Status:  Discontinued        1,500 mg 150 mL/hr over 120 Minutes Intravenous Every 24 hours 11/12/2020 2225 10/22/20 0759   10/22/20 0900  linezolid (ZYVOX) IVPB 600 mg  Status:  Discontinued        600 mg 300 mL/hr over 60 Minutes Intravenous Every 12 hours 10/22/20 0800 10/27/20 0852   10/22/20 0300  piperacillin-tazobactam (ZOSYN) IVPB 3.375 g  Status:   Discontinued        3.375 g 12.5 mL/hr over 240 Minutes Intravenous Every 8 hours 10/14/2020 2225 10/28/20 1514   10/20/2020 2335  metroNIDAZOLE (FLAGYL) IVPB 500 mg  Status:  Discontinued        500 mg 100 mL/hr over 60 Minutes Intravenous Every 12 hours 10/19/2020 2142 10/22/20 0747   10/23/2020 2248  fluconazole (DIFLUCAN) IVPB 400 mg  Status:  Discontinued        400 mg 100 mL/hr over 120 Minutes Intravenous Every 24 hours 10/26/2020 2142 10/24/20 0943   10/24/2020 1748  vancomycin (VANCOREADY) IVPB 2000 mg/400 mL        2,000 mg 200 mL/hr over 120 Minutes Intravenous  Once 10/26/2020 1706 10/15/2020 2113   10/29/2020 1715  piperacillin-tazobactam (ZOSYN) IVPB 3.375 g        3.375 g 100 mL/hr over 30 Minutes Intravenous  Once 10/19/2020 1706 10/18/2020 1903        I have personally reviewed the following labs and images: CBC: Recent Labs  Lab 10/31/20 0352 11/02/20 0925 11/04/20 0847 11/05/20 0345  WBC 11.1* 9.4 8.2 8.6  NEUTROABS 8.0* 7.8* 6.4  --   HGB 8.4* 8.6* 7.8* 8.0*  HCT 27.0* 27.3* 25.1* 25.0*  MCV 107.6* 105.8* 104.6* 104.2*  PLT 112* 159 191 215   BMP &GFR Recent Labs  Lab 10/31/20 0352 11/01/20 0358 11/02/20 0925 11/04/20 0847 11/06/20 0617  NA 144 142 136 137 139  K 4.2 4.2 4.0 4.0 3.8  CL 107 99 101 104 105  CO2 GLUCOSE 129* 212* 230* 236* 215*  BUN CREATININE 0.61 0.59* 0.61 0.58* 0.51*  CALCIUM 8.6* 8.7* 8.6* 8.2* 7.9*  MG  --  2.0  --  1.9  --   PHOS  --  2.9  --  3.1 2.9   Estimated Creatinine Clearance: 126 mL/min (A) (by C-G formula based on SCr of 0.51 mg/dL (L)). Liver & Pancreas: Recent Labs  Lab 10/31/20 0352 11/01/20 0358 11/04/20 0847 11/06/20 0617 11/06/20 0618  AST 31 33 23  --  26  ALT --  16  ALKPHOS 94 89 76  --  89  BILITOT 0.8 0.7 0.5  --  0.6  PROT 5.6* 5.7* 5.6*  --  5.9*  ALBUMIN 1.5* 1.5* 1.4* 1.3* 1.3*   No results for input(s): LIPASE, AMYLASE in the last 168 hours. No results for  input(s): AMMONIA in the last 168 hours. Diabetic: No results for input(s): HGBA1C in the last 72 hours. Recent Labs  Lab 11/05/20 2147 11/06/20 0036 11/06/20 0348 11/06/20 0739 11/06/20 1128  GLUCAP 228* 212* 207* 192* 256*   Cardiac Enzymes: No results for input(s): CKTOTAL, CKMB, CKMBINDEX, TROPONINI in the last 168 hours. No results for input(s): PROBNP in the last 8760 hours. Coagulation Profile: No results for input(s): INR, PROTIME in the last 168 hours. Thyroid Function Tests: No results for input(s): TSH, T4TOTAL, FREET4, T3FREE, THYROIDAB in the last 72 hours. Lipid Profile: No results for input(s): CHOL, HDL, LDLCALC, TRIG, CHOLHDL, LDLDIRECT in the last 72 hours. Anemia Panel: No results for input(s): VITAMINB12, FOLATE, FERRITIN, TIBC, IRON, RETICCTPCT in the last 72 hours. Urine analysis:    Component Value Date/Time   COLORURINE AMBER (A) 10-24-20 1549   APPEARANCEUR CLEAR 24-Oct-2020 1549   LABSPEC 1.015 Oct 24, 2020 1549   PHURINE 6.0 10-24-20 1549   GLUCOSEU NEGATIVE 10/24/20 1549   HGBUR TRACE (A) 10-24-20 1549   BILIRUBINUR SMALL (A) 2020-10-24 1549   KETONESUR 15 (A) 24-Oct-2020 1549   PROTEINUR NEGATIVE 24-Oct-2020 1549   NITRITE NEGATIVE 10/24/2020 1549   LEUKOCYTESUR NEGATIVE 10/24/20 1549   Sepsis Labs: Invalid input(s): PROCALCITONIN, LACTICIDVEN  Microbiology: Recent Results (from the past 240 hour(s))  Expectorated Sputum Assessment w Gram Stain, Rflx to Resp Cult     Status: None   Collection Time: 10/28/20  2:57 PM   Specimen: Expectorated Sputum  Result Value Ref Range Status   Specimen Description EXPECTORATED SPUTUM  Final   Special Requests NONE  Final   Sputum evaluation   Final    THIS SPECIMEN IS ACCEPTABLE FOR SPUTUM CULTURE Performed at [facility], [address], [place], [place] [address]    Report Status 10/30/2020 FINAL  Final  Culture, Respiratory w Gram Stain     Status: None   Collection Time: 10/28/20   2:57 PM  Result Value Ref Range Status   Specimen Description EXPECTORATED SPUTUM  Final   Special Requests NONE Reflexed from [id number]  Final   Gram Stain   Final    FEW SQUAMOUS EPITHELIAL CELLS PRESENT MODERATE WBC PRESENT,BOTH PMN AND MONONUCLEAR ABUNDANT GRAM NEGATIVE RODS Performed at [facility], [address], [place], [place] [address]    Culture MODERATE STENOTROPHOMONAS MALTOPHILIA  Final   Report Status 11/01/2020 FINAL  Final   Organism ID, Bacteria STENOTROPHOMONAS MALTOPHILIA  Final      Susceptibility   Stenotrophomonas maltophilia - MIC*    LEVOFLOXACIN 0.5 SENSITIVE Sensitive     TRIMETH/SULFA <=20 SENSITIVE Sensitive     * MODERATE STENOTROPHOMONAS MALTOPHILIA  Culture, blood (Routine X 2) w Reflex to ID Panel     Status: None (Preliminary result)   Collection Time: 11/03/20 11:23 AM   Specimen: BLOOD  Result Value Ref Range Status   Specimen Description BLOOD LEFT ANTECUBITAL  Final   Special Requests   Final    BOTTLES DRAWN AEROBIC ONLY Blood Culture adequate volume   Culture   Final    NO GROWTH 3 DAYS Performed at [facility], [address] [place], [place] [address]    Report Status PENDING  Incomplete  Culture, blood (Routine X 2) w Reflex to ID Panel     Status: None (Preliminary result)   Collection Time: 11/03/20 11:24 AM   Specimen: BLOOD LEFT HAND  Result Value Ref Range Status   Specimen Description BLOOD LEFT HAND  Final   Special Requests   Final    BOTTLES DRAWN AEROBIC ONLY Blood Culture adequate volume   Culture   Final    NO GROWTH 3 DAYS Performed at [facility], [address] [place], [place] [address]    Report Status PENDING  Incomplete    Radiology Studies: DG Chest Port 1 View  Result Date: 11/06/2020 CLINICAL DATA:  Dyspnea EXAM: PORTABLE CHEST 1 VIEW COMPARISON:  11/04/2020 FINDINGS: Nasoenteric feeding tube extends into the upper abdomen beyond the margin of the examination. Lung volumes are small.  Small bilateral pleural effusions are present. Retrocardiac opacification is present representing atelectasis, infiltrate, or posteriorly layering pleural fluid in this location. Right upper extremity PICC line tip noted within the superior cavoatrial junction. Cardiac size is within normal limits. No acute bone abnormality. IMPRESSION: Stable support lines and tubes. Pulmonary hypoinflation. Small bilateral pleural effusions. Associated retrocardiac collapse and consolidation Electronically Signed   By: Helyn Numbers M.D.   On: 11/06/2020 03:48     60 minutes with more than 50% spent in reviewing records, counseling patient/family and coordinating care.   Nalina Yeatman T. Yuriy Cui [facility]  If 7PM-7AM, please contact night-coverage [email or web address] 11/06/2020, 11:56 AM

## 2020-11-06 NOTE — Progress Notes (Signed)
16 Days Post-Op  Subjective: CC: Temp of 101.1 overnight. Per notes, patient had increased respiratory drive, wheezing and coarse breath sounds last night. CXR showed bilateral pleural effusions with associated retrocardiac collapse and consolidation. He is on 4L. He reports no abdominal pain this morning but does have some mild tenderness of his upper abdomen similar to yesterday. Tolerating tf's without n/v. Having liquid bm's and passing flatus. Voiding.   Objective: Vital signs in last 24 hours: Temp:  [98.1 F (36.7 C)-101.1 F (38.4 C)] 98.4 F (36.9 C) (09/24 0727) Pulse Rate:  [101-119] 109 (09/24 0815) Resp:  [18-24] 22 (09/24 0727) BP: (114-148)/(77-84) 116/81 (09/24 0727) SpO2:  [83 %-94 %] 94 % (09/24 0727) Last BM Date: 11/05/20  Intake/Output from previous day: 09/23 0701 - 09/24 0700 In: 100 [NG/GT:100] Out: 1150 [Urine:1000; Drains:150] Intake/Output this shift: No intake/output data recorded.  PE: Gen:  Awake and alert, sitting up in chair in nad Heart: Tachycardic  Pulm:  Distant breath sounds throughout with coarse breath sounds at the bases. On o2 Abd: Distended but soft, mild generalized tenderness greatest around the top of his midline incision that is stable to slightly improved from yesterday without rigidity or guarding. RUQ drains x2 with brown fluid in bulbs. IMidline wound pale pink with early granulation, and clean without dehiscence. Laparoscopic site with staple in place, c/d/i Msk: 2+ pitting edema of the b/l LE's   Lab Results:  Recent Labs    11/04/20 0847 11/05/20 0345  WBC 8.2 8.6  HGB 7.8* 8.0*  HCT 25.1* 25.0*  PLT 191 215   BMET Recent Labs    11/04/20 0847 11/06/20 0617  NA 137 139  K 4.0 3.8  CL 104 105  CO2 27 27  GLUCOSE 236* 215*  BUN 16 17  CREATININE 0.58* 0.51*  CALCIUM 8.2* 7.9*   PT/INR No results for input(s): LABPROT, INR in the last 72 hours. CMP     Component Value Date/Time   NA 139 11/06/2020 0617    K 3.8 11/06/2020 0617   CL 105 11/06/2020 0617   CO2 27 11/06/2020 0617   GLUCOSE 215 (H) 11/06/2020 0617   BUN 17 11/06/2020 0617   CREATININE 0.51 (L) 11/06/2020 0617   CALCIUM 7.9 (L) 11/06/2020 0617   PROT 5.9 (L) 11/06/2020 0618   ALBUMIN 1.3 (L) 11/06/2020 0618   AST 26 11/06/2020 0618   ALT 16 11/06/2020 0618   ALKPHOS 89 11/06/2020 0618   BILITOT 0.6 11/06/2020 0618   GFRNONAA >60 11/06/2020 0617   Lipase     Component Value Date/Time   LIPASE 25 11/09/2020 1506    Studies/Results: DG Chest Port 1 View  Result Date: 11/06/2020 CLINICAL DATA:  Dyspnea EXAM: PORTABLE CHEST 1 VIEW COMPARISON:  11/04/2020 FINDINGS: Nasoenteric feeding tube extends into the upper abdomen beyond the margin of the examination. Lung volumes are small. Small bilateral pleural effusions are present. Retrocardiac opacification is present representing atelectasis, infiltrate, or posteriorly layering pleural fluid in this location. Right upper extremity PICC line tip noted within the superior cavoatrial junction. Cardiac size is within normal limits. No acute bone abnormality. IMPRESSION: Stable support lines and tubes. Pulmonary hypoinflation. Small bilateral pleural effusions. Associated retrocardiac collapse and consolidation Electronically Signed   By: Helyn Numbers M.D.   On: 11/06/2020 03:48   DG CHEST PORT 1 VIEW  Result Date: 11/04/2020 CLINICAL DATA:  Hypoxia and altered mental status. EXAM: PORTABLE CHEST 1 VIEW COMPARISON:  October 31, 2020 FINDINGS: There  is stable right-sided PICC line and nasogastric tube positioning. Mild, stable bibasilar atelectasis and/or infiltrate is seen. Small bilateral pleural effusions are noted, right greater than left. This is mildly increased in size on the right when compared to the prior study. No pneumothorax is identified the heart size and mediastinal contours are within normal limits. The visualized skeletal structures are unremarkable. IMPRESSION: 1.  Mild, stable bibasilar atelectasis and/or infiltrate. 2. Small bilateral pleural effusions, right greater than left. This is mildly increased in size on the right when compared to the prior study. Electronically Signed   By: Aram Candela M.D.   On: 11/04/2020 15:21   VAS [place] LOWER EXTREMITY VENOUS (DVT)  Result Date: 11/05/2020  Lower Venous DVT Study Patient Name:  Paul Chambers  Date of Exam:   11/05/2020 Medical Rec #: [id number]     Accession #:    [id number] Date of Birth: 03-Aug-1965    Patient Gender: M Patient Age:   55 years Exam Location:  [facility] Procedure:      VAS [place] LOWER EXTREMITY VENOUS (DVT) Referring Phys: Ensley Blas --------------------------------------------------------------------------------  Indications: Edema.  Comparison Study: no prior Performing Technologist: [place] Ponder RVS  Examination Guidelines: A complete evaluation includes B-mode imaging, spectral Doppler, color Doppler, and power Doppler as needed of all accessible portions of each vessel. Bilateral testing is considered an integral part of a complete examination. Limited examinations for reoccurring indications may be performed as noted. The reflux portion of the exam is performed with the patient in reverse Trendelenburg.  +---------+---------------+---------+-----------+----------+--------------+ RIGHT    CompressibilityPhasicitySpontaneityPropertiesThrombus Aging +---------+---------------+---------+-----------+----------+--------------+ CFV      Full           Yes      Yes                                 +---------+---------------+---------+-----------+----------+--------------+ SFJ      Full                                                        +---------+---------------+---------+-----------+----------+--------------+ FV Prox  Full                                                        +---------+---------------+---------+-----------+----------+--------------+ FV Mid    Full                                                        +---------+---------------+---------+-----------+----------+--------------+ FV DistalFull                                                        +---------+---------------+---------+-----------+----------+--------------+ PFV      Full                                                        +---------+---------------+---------+-----------+----------+--------------+  POP      Full           Yes      Yes                                 +---------+---------------+---------+-----------+----------+--------------+ PTV      Full                                                        +---------+---------------+---------+-----------+----------+--------------+ PERO     Full                                                        +---------+---------------+---------+-----------+----------+--------------+   +---------+---------------+---------+-----------+----------+--------------+ LEFT     CompressibilityPhasicitySpontaneityPropertiesThrombus Aging +---------+---------------+---------+-----------+----------+--------------+ CFV      Full           Yes      Yes                                 +---------+---------------+---------+-----------+----------+--------------+ SFJ      Full                                                        +---------+---------------+---------+-----------+----------+--------------+ FV Prox  Full                                                        +---------+---------------+---------+-----------+----------+--------------+ FV Mid   Full                                                        +---------+---------------+---------+-----------+----------+--------------+ FV DistalFull                                                        +---------+---------------+---------+-----------+----------+--------------+ PFV      Full                                                         +---------+---------------+---------+-----------+----------+--------------+ POP      Full           Yes      Yes                                 +---------+---------------+---------+-----------+----------+--------------+  PTV      Full                                                        +---------+---------------+---------+-----------+----------+--------------+ PERO     Full                                                        +---------+---------------+---------+-----------+----------+--------------+     Summary: BILATERAL: - No evidence of deep vein thrombosis seen in the lower extremities, bilaterally. -No evidence of popliteal cyst, bilaterally.   *See table(s) above for measurements and observations. Electronically signed by Lemar Livings MD on 11/05/2020 at 3:03:21 PM.    Final     Anti-infectives: Anti-infectives (From admission, onward)    Start     Dose/Rate Route Frequency Ordered Stop   11/01/20 0900  levofloxacin (LEVAQUIN) IVPB 750 mg        750 mg 100 mL/hr over 90 Minutes Intravenous Every 24 hours 11/01/20 0824     11/01/20 0900  metroNIDAZOLE (FLAGYL) IVPB 500 mg        500 mg 100 mL/hr over 60 Minutes Intravenous Every 12 hours 11/01/20 0824     10/29/20 2200  meropenem (MERREM) 1 g in sodium chloride 0.9 % 100 mL IVPB  Status:  Discontinued        1 g 200 mL/hr over 30 Minutes Intravenous Every 8 hours 10/29/20 1428 11/01/20 0824   10/28/20 1615  meropenem (MERREM) 2 g in sodium chloride 0.9 % 100 mL IVPB  Status:  Discontinued        2 g 200 mL/hr over 30 Minutes Intravenous Every 8 hours 10/28/20 1515 10/29/20 1428   10/28/20 1445  metroNIDAZOLE (FLAGYL) IVPB 500 mg  Status:  Discontinued        500 mg 100 mL/hr over 60 Minutes Intravenous Every 12 hours 10/28/20 1348 10/28/20 1505   10/25/20 2300  fluconazole (DIFLUCAN) IVPB 400 mg        400 mg 100 mL/hr over 120 Minutes Intravenous Every 24 hours 10/25/20 0742 10/29/20 0718    10/24/20 2300  fluconazole (DIFLUCAN) IVPB 200 mg  Status:  Discontinued        200 mg 100 mL/hr over 60 Minutes Intravenous Every 24 hours 10/24/20 0943 10/25/20 0742   10/22/20 1800  vancomycin (VANCOREADY) IVPB 1500 mg/300 mL  Status:  Discontinued        1,500 mg 150 mL/hr over 120 Minutes Intravenous Every 24 hours 10/27/2020 2225 10/22/20 0759   10/22/20 0900  linezolid (ZYVOX) IVPB 600 mg  Status:  Discontinued        600 mg 300 mL/hr over 60 Minutes Intravenous Every 12 hours 10/22/20 0800 10/27/20 0852   10/22/20 0300  piperacillin-tazobactam (ZOSYN) IVPB 3.375 g  Status:  Discontinued        3.375 g 12.5 mL/hr over 240 Minutes Intravenous Every 8 hours 11/12/2020 2225 10/28/20 1514   10/26/2020 2335  metroNIDAZOLE (FLAGYL) IVPB 500 mg  Status:  Discontinued        500 mg 100 mL/hr over 60 Minutes Intravenous Every 12 hours 10/25/2020 2142 10/22/20 0747   10/20/2020 2248  fluconazole (DIFLUCAN) IVPB 400 mg  Status:  Discontinued        400 mg 100 mL/hr over 120 Minutes Intravenous Every 24 hours 10/28/2020 2142 10/24/20 0943   10/20/2020 1748  vancomycin (VANCOREADY) IVPB 2000 mg/400 mL        2,000 mg 200 mL/hr over 120 Minutes Intravenous  Once 11/01/2020 1706 10/20/2020 2113   11/04/2020 1715  piperacillin-tazobactam (ZOSYN) IVPB 3.375 g        3.375 g 100 mL/hr over 30 Minutes Intravenous  Once 11/04/2020 1706 10/30/2020 1903        Assessment/Plan POD 15 s/p ex lap with pancreatic debridement for infected necrotic pancreatitis by Dr. Sheliah Hatch on 9/9 - Superior drain under the mesocolon along with the head of the pancreas - Inferior drain under the mesocolon along the left upper abdomen and what is likely a position inferior to the pancreas - Continue drains and monitor - Post pyloric TF's to goal and d/c TPN. Speech can see to eval if passes swallow.  -CT 9/17 demonstrates large amount of residual gas and fluid dissecting through the retroperitoneum, no evidence of enteric fistula or leak.  Fever overnight but suspect this is coming from respiratory source given + resp cx's, cxr findings, stable drain output and minimal tenderness on abdominal exam. Consider CT if develops any change in abdominal exam or symptoms.  - BID WTD - D/c staple (spoke with RN) - Cont therapies, rec CIR who is following - Pulm toilet - Appreciate TRH's assistance w/ his care   FEN: D/c TPN, TF's to goal. Speech eval ID: Levoquin/Flagyl for PNA. T 101.1 overnight.  VTE: SCDs, heparin subq. LE [place] neg for DVT's Foley - out, ext cath, voiding    - Per primary -  VDRF - now extubated PNA w/ + resp cx's - cx's w/ stenotrophomonas maltophilia - on abx. On 4L  B/l pleural effusions Abl anemia - hgb stable ETOH use  AKI - resolved T2DM   LOS: 16 days    Jacinto Halim , [facility] 11/06/2020, 8:34 AM Please see Amion for pager number during day hours 7:00am-4:30pm

## 2020-11-06 NOTE — Progress Notes (Signed)
HOSPITAL MEDICINE OVERNIGHT EVENT NOTE    Received a call from nursing staff due to concerns about observed increase in jaundice of the skin.  In addition of this nursing is concerned that patient is developing more coarse breath sounds with wheezing.  Of note, patient's oxygen requirement has not increased in the past several days.  Will obtain repeat chemistry and hepatic function panel now as well stat chest x-ray to assess nursing concerns.  Nursing is additionally contacted respiratory therapy to provide a breathing treatment for the patient.  Will continue to monitor.  Marinda Elk  MD [facility]

## 2020-11-06 NOTE — Plan of Care (Signed)
?  Problem: Respiratory: ?Goal: Ability to maintain a clear airway and adequate ventilation will improve ?Outcome: Progressing ?  ?Problem: Education: ?Goal: Knowledge of General Education information will improve ?Description: Including pain rating scale, medication(s)/side effects and non-pharmacologic comfort measures ?Outcome: Progressing ?  ?Problem: Health Behavior/Discharge Planning: ?Goal: Ability to manage health-related needs will improve ?Outcome: Progressing ?  ?Problem: Clinical Measurements: ?Goal: Ability to maintain clinical measurements within normal limits will improve ?Outcome: Progressing ?Goal: Will remain free from infection ?Outcome: Progressing ?Goal: Diagnostic test results will improve ?Outcome: Progressing ?Goal: Respiratory complications will improve ?Outcome: Progressing ?Goal: Cardiovascular complication will be avoided ?Outcome: Progressing ?  ?Problem: Activity: ?Goal: Risk for activity intolerance will decrease ?Outcome: Progressing ?  ?Problem: Nutrition: ?Goal: Adequate nutrition will be maintained ?Outcome: Progressing ?  ?Problem: Coping: ?Goal: Level of anxiety will decrease ?Outcome: Progressing ?  ?Problem: Elimination: ?Goal: Will not experience complications related to bowel motility ?Outcome: Progressing ?Goal: Will not experience complications related to urinary retention ?Outcome: Progressing ?  ?Problem: Pain Managment: ?Goal: General experience of comfort will improve ?Outcome: Progressing ?  ?Problem: Safety: ?Goal: Ability to remain free from injury will improve ?Outcome: Progressing ?  ?Problem: Skin Integrity: ?Goal: Risk for impaired skin integrity will decrease ?Outcome: Progressing ?  ?

## 2020-11-06 NOTE — Consult Note (Signed)
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 No prior culture but did appear infected  No other source of focal infectious syndrome otherwise No obvious sign of drug related fever   Would repeat abd pelv ct to see if there is any drainable abscess that had organized to facilitate abx tx Would change levo/flagyl to iv piptazo given burden of disease and piptazo has higher barier to resistance and cover enterococcus better  If abd ct showed organized abscess would discuss with ir for drainage and culture to see if any organism we are missing or resistant esbl mdr presence  If above done and without improvement, would consider removing picc line and give line holiday  Plan: Stop levo/flagyl Restart piptazo Abd pelv ct with contrast Dr Luciana Axe to take over Monday.  Discussed with primary team   I spent 60 minute reviewing data/chart, and coordinating care and >50% direct face to face time providing counseling/discussing diagnostics/treatment plan with patient       ------------------------------------------------ Principal Problem:   Severe sepsis (HCC) Active Problems:   Acute necrotizing pancreatitis   Toxic encephalopathy   Hyponatremia   Hypoalbuminemia   Macrocytic anemia   Pressure injury of  skin   Protein-calorie malnutrition, severe    HPI: Paul Chambers is a 55 y.o. male alcoholic, obese, admitted 9/08 for ongoing abd pain found to have presumed infected pancreatic necrosis with clinical pancreatitis, course with ongoing fever    Patient underwent exlap on admission for concern of pneumoperitoneum. No visceral perforation found. Presumed severe necrotic pancreatitis with infection. Placed on abx  Unfortunately no specific culture sent  He has been on tpn, which is scheduled to stop today as tube feed is being tolerated  Blood cx's have been negative most recent 9/21 9/15 has srepiratory cx that showed steno.  Most recent cxr no focal opacity He is on 3-4 liters o2 supplement; no cough currently  Just complained of discomfort in abdomen and inability to get help to get out of bed to do pt/ot  No rash, joint pain No diarrhea  Objective review Initial leukemoid reaction on admission had resolved He has intermittent low grade temp but recently without hemodynamic disturbance or worsening leukocytosis His last febrile episode was 09/23. It appears to be continuous since 9/15  Abx course: 9/08-15 vanc/piptazo/fluconazole 9/16-19 meropenem 9/19-c levo 9/19-c flagyl  His last abd ct 9/16 showed still diffuse air/fluid throughout peritoneal cavity The drain continues to place out purulent material      Family History  Problem Relation Age of Onset   Diverticulitis Mother    Liver cancer Maternal Aunt        cholangiocarcinoma    Social History   Tobacco Use   Smoking status: Every Day    Types: Cigarettes  Substance  Use Topics   Alcohol use: Not Currently    Comment: quit drinking whiskey 2 weeks ago    No Known Allergies  Review of Systems: ROS All Other ROS was negative, except mentioned above   Past Medical History:  Diagnosis Date   Alcoholism (HCC)    Gout    Hypertension    Pancreatitis    Tobacco abuse        Scheduled Meds:   chlorhexidine  15 mL Mouth Rinse BID   Chlorhexidine Gluconate Cloth  6 each Topical Q0600   feeding supplement (PROSource TF)  45 mL Per Tube TID   fluticasone furoate-vilanterol  1 puff Inhalation Daily   And   umeclidinium bromide  1 puff Inhalation Daily   [START ON 11/07/2020] folic acid  1 mg Per Tube Daily   furosemide  40 mg Intravenous BID   heparin  5,000 Units Subcutaneous Q8H   insulin aspart  0-20 Units Subcutaneous Q4H   insulin aspart  9 Units Subcutaneous Q4H   insulin glargine-yfgn  15 Units Subcutaneous Daily   mouth rinse  15 mL Mouth Rinse q12n4p   pantoprazole (PROTONIX) IV  40 mg Intravenous Q24H   sodium chloride flush  10-40 mL Intracatheter Q12H   sodium chloride flush  10-40 mL Intracatheter Q12H   [START ON 11/07/2020] thiamine  100 mg Per Tube Daily   Continuous Infusions:  sodium chloride Stopped (10/31/20 0131)   albumin human 25 g (11/06/20 1336)   feeding supplement (VITAL 1.5 CAL) 1,000 mL (11/06/20 1000)   levofloxacin (LEVAQUIN) IV 750 mg (11/06/20 0928)   metronidazole 500 mg (11/06/20 0915)   TPN ADULT (ION) 50 mL/hr at 11/05/20 1759   PRN Meds:.sodium chloride, acetaminophen (TYLENOL) oral liquid 160 mg/5 mL, acetaminophen, albuterol, docusate, haloperidol lactate, HYDROmorphone (DILAUDID) injection, ondansetron (ZOFRAN) IV, polyethylene glycol, sodium chloride flush, sodium chloride flush   OBJECTIVE: Blood pressure 127/80, pulse (!) 110, temperature 99.6 F (37.6 C), temperature source Oral, resp. rate (!) 22, height 5\' 9"  (1.753 m), weight 100.2 kg, SpO2 95 %.  Physical Exam  General/constitutional: no distress, conversant HEENT: Normocephalic, PER, Conj Clear, EOMI, Oropharynx clear Neck supple CV: rrr no mrg Lungs: clear to auscultation, normal respiratory effort Abd: Soft but distended; nontender; wound site good granulating tissue no surrounding erythema/discharge; the 2 perc drain with sedimentous/purulent output Ext: no  edema Skin: No Rash Neuro: nonfocal MSK: no peripheral joint swelling/tenderness/warmth; back spines nontender   Central line presence: rue picc site no erythema/purulence; external foley catheter in place   Lab Results Lab Results  Component Value Date   WBC 8.6 11/05/2020   HGB 8.0 (L) 11/05/2020   HCT 25.0 (L) 11/05/2020   MCV 104.2 (H) 11/05/2020   PLT 215 11/05/2020    Lab Results  Component Value Date   CREATININE 0.51 (L) 11/06/2020   BUN 17 11/06/2020   NA 139 11/06/2020   K 3.8 11/06/2020   CL 105 11/06/2020   CO2 27 11/06/2020    Lab Results  Component Value Date   ALT 16 11/06/2020   AST 26 11/06/2020   ALKPHOS 89 11/06/2020   BILITOT 0.6 11/06/2020      Microbiology: Recent Results (from the past 240 hour(s))  Expectorated Sputum Assessment w Gram Stain, Rflx to Resp Cult     Status: None   Collection Time: 10/28/20  2:57 PM   Specimen: Expectorated Sputum  Result Value Ref Range Status   Specimen Description EXPECTORATED SPUTUM  Final  Special Requests NONE  Final   Sputum evaluation   Final    THIS SPECIMEN IS ACCEPTABLE FOR SPUTUM CULTURE Performed at [facility], [address], [place], [place] [address]    Report Status 10/30/2020 FINAL  Final  Culture, Respiratory w Gram Stain     Status: None   Collection Time: 10/28/20  2:57 PM  Result Value Ref Range Status   Specimen Description EXPECTORATED SPUTUM  Final   Special Requests NONE Reflexed from [id number]  Final   Gram Stain   Final    FEW SQUAMOUS EPITHELIAL CELLS PRESENT MODERATE WBC PRESENT,BOTH PMN AND MONONUCLEAR ABUNDANT GRAM NEGATIVE RODS Performed at [facility], [address] [place], [place] [address]    Culture MODERATE STENOTROPHOMONAS MALTOPHILIA  Final   Report Status 11/01/2020 FINAL  Final   Organism ID, Bacteria STENOTROPHOMONAS MALTOPHILIA  Final      Susceptibility   Stenotrophomonas maltophilia - MIC*    LEVOFLOXACIN 0.5 SENSITIVE Sensitive      TRIMETH/SULFA <=20 SENSITIVE Sensitive     * MODERATE STENOTROPHOMONAS MALTOPHILIA  Culture, blood (Routine X 2) w Reflex to ID Panel     Status: None (Preliminary result)   Collection Time: 11/03/20 11:23 AM   Specimen: BLOOD  Result Value Ref Range Status   Specimen Description BLOOD LEFT ANTECUBITAL  Final   Special Requests   Final    BOTTLES DRAWN AEROBIC ONLY Blood Culture adequate volume   Culture   Final    NO GROWTH 3 DAYS Performed at [facility], [address], [place], [place] [address]    Report Status PENDING  Incomplete  Culture, blood (Routine X 2) w Reflex to ID Panel     Status: None (Preliminary result)   Collection Time: 11/03/20 11:24 AM   Specimen: BLOOD LEFT HAND  Result Value Ref Range Status   Specimen Description BLOOD LEFT HAND  Final   Special Requests   Final    BOTTLES DRAWN AEROBIC ONLY Blood Culture adequate volume   Culture   Final    NO GROWTH 3 DAYS Performed at [facility], [address] [place], [place] [address]    Report Status PENDING  Incomplete     Serology:    Imaging: If present, new imagings (plain films, ct scans, and mri) have been personally visualized and interpreted; radiology reports have been reviewed. Decision making incorporated into the Impression / Recommendations.  9/24 cxr Stable support lines and tubes.   Pulmonary hypoinflation.   Small bilateral pleural effusions. Associated retrocardiac collapse and consolidation  9/23 vascular u/s LE No dvt bilateral le  9/16 ct abd pelv 1. Interval abdominal surgical drain placement and positioning, as described above, with a large amount of residual gas and fluid dissecting through the retroperitoneum. 2. Moderate to marked severity bilateral lower lobe consolidation with bilateral pleural effusions, left greater than right. 3. Mild to moderate amount of ascites. Raymondo Band, MD [facility] [phone] pager    11/06/2020, 1:53 PM

## 2020-11-06 NOTE — Progress Notes (Signed)
Tonight condition has deteriorated. His skin is now jaundice in areas mostly hips, upper thighs, and abd around JP Drain sites. The JP Drain sites themselves has a sudden gush of thin tan liquid getting on gown and linen. His stools had been soft now they are liquid. Pt is more depressed about still being in the hospital, not eating, and not getting out of bed. Pt's currently at 4 l/m [place] oxygen for the last 36 hours approx but sats remain around 90%. Abd is back to being more distended and firm.

## 2020-11-06 NOTE — Progress Notes (Signed)
Pharmacy Antibiotic Note  Paul Chambers is a 55 y.o. male admitted on 10/26/2020 with abdominal pain found to be infected necrotizing pancreatitis with pneumoperitoneum. TPN to be stopped 9/24. Surgical drain in RUQ placed 9/8 and continues to have purulent output. Infectious diseases has recommended switch from levofloxacin/flagyl to zosyn due to higher barrier to resistance, burden of disease, and covering of enterococcus. Pharmacy has been consulted for Zosyn dosing.  Plan: - START Zosyn 3.375 g IV Q8H - F/U ID recs - F/U renal function, cultures, abd/pelvis CT   Height: 5\' 9"  (175.3 cm) Weight: 100.2 kg (220 lb 14.4 oz) IBW/kg (Calculated) : 70.7  Temp (24hrs), Avg:99.3 F (37.4 C), Min:98.1 F (36.7 C), Max:101.1 F (38.4 C)  Recent Labs  Lab 10/31/20 0352 11/01/20 0358 11/02/20 0925 11/04/20 0847 11/05/20 0345 11/06/20 0617 11/06/20 0620  WBC 11.1*  --  9.4 8.2 8.6  --  10.7*  CREATININE 0.61 0.59* 0.61 0.58*  --  0.51*  --     Estimated Creatinine Clearance: 126 mL/min (A) (by C-G formula based on SCr of 0.51 mg/dL (L)).    No Known Allergies  Antimicrobials this admission: 9/8 vancomycin>>9/15 9/8 fluconazole>>9/15 9/8 zosyn>>9/15; 9/24>> 9/16 meropenem>>9/19 9/19 levofloxacin>>9/24 9/19 flagyl>>9/24   Microbiology results: 9/21 BCx: ngtd 9/15 Sputum: abundant GNRs, moderate stenotrophomonas maltophilia    10/15, PharmD PGY-1 Acute Care Resident  11/06/2020 3:03 PM

## 2020-11-06 NOTE — Progress Notes (Signed)
Speech Language Pathology Treatment: Dysphagia  Patient Details Name: Paul Chambers MRN: [id number] DOB: [birth year] Today's Date: 11/06/2020 Time: 0071-2197 SLP Time Calculation (min) (ACUTE ONLY): 26 min  Assessment / Plan / Recommendation Clinical Impression  Pt eager for PO intake.  He completed his own oral care.  Initially with coughing on first sip of thin liquid, but no further coughing nor other signs of impaired airway protection on subsequent intake.  Consumed thin water, juice, and a container of applesauce good oral attention, normal manipulation, the appearance of a brisk swallow, and no s/s of aspiration.    Reached out to surgery to confirm initiation of a full liquid diet.  Pt pleased with progress.  D/W RN.    HPI HPI: 55 y.o. Caucasian male smoker presented to the [facility] Emergency Department via private vehicle with complaints of abdominal pain and altered mental status. Found to have necrotic alcoholic pancreatitis s/p ex lap, sepsis, toxic encephalopathy,  ETT 9/9 to 9/12. Pt with post op ileus. Pt has had NG with trickle feeds, TPN; fluctuating ability to tolerate TF. New swallow orders 9/24 per surgery, given improvements in tolerating TF without n/v.      SLP Plan  Continue with current plan of care      Recommendations for follow up therapy are one component of a multi-disciplinary discharge planning process, led by the attending physician.  Recommendations may be updated based on patient status, additional functional criteria and insurance authorization.    Recommendations  Diet recommendations: Other(comment) (full liquids) Liquids provided via: Cup;Straw Medication Administration: Whole meds with puree Supervision: Intermittent supervision to cue for compensatory strategies Compensations: Minimize environmental distractions Postural Changes and/or Swallow Maneuvers: Seated upright 90 degrees                Oral Care  Recommendations: Oral care BID SLP Visit Diagnosis: Dysphagia, unspecified (R13.10) Plan: Continue with current plan of care       Ellouise Mcwhirter L. Samson Frederic, MA CCC/SLP Acute Rehabilitation Services Office number [phone] Pager [phone]                 Blenda Mounts Laurice 11/06/2020, 9:20 AM

## 2020-11-06 NOTE — Progress Notes (Signed)
PHARMACY - TOTAL PARENTERAL NUTRITION CONSULT NOTE  Indication:  severe pancreatitis  Patient Measurements: Height: 5\' 9"  (175.3 cm) Weight: 100.2 kg (220 lb 14.4 oz) IBW/kg (Calculated) : 70.7 TPN AdjBW (KG): 78 Body mass index is 32.62 kg/m.  Assessment:  53 yom presenting 9/8 with septic shock due to necrotizing pancreatitis with pneumoperitoneum s/p ex-lap 9/9. Pt transferred to ICU post-op, intubated and sedated. Pharmacy consulted to manage TPN for severe pancreatitis. Pt at risk of refeeding with minimal PO intake x 1 week PTA, decreased PO intake x 1.5 months PTA with significant weight loss and hx heavy alcohol abuse.  Glucose / Insulin: A1c 6.8% (no meds pta). CBGs 200s with TF at goal Used 30 units SSI in past 24 hrs, 40 units insulin in TPN, Novolog 6u Q4H (27 units) Electrolytes: all WNL (Phos low normal) Renal: SCr < 1 stable , BUN WNL (Lasix increased to 40mg  IV BID on 9/17) Hepatic: LFTs / tbili normalized (jaundice per documentation), TG WNL, albumin 1.4 Intake / Output; MIVF: abd JP drains x 2 O/P 25mL, UOP 0.4 ml/kg/hr, net +3.2L (down), BM x5, emesis 9/20 GI Imaging: 9/8 CT - extensive pneumoperitoneum 9/16 CT - large amount of residual gas and fluid dissecting through retroperitoneum, no evidence of enteric fistula or leak  9/19 abd XR - feeding catheter in distal duodenum/proximal jejunum GI Surgeries / Procedures:  9/9 ex-lap (no colonic perforation, large necrotic fluid present surrounding pancreas)  Central access: CVC 10/22/20 TPN start date: 10/23/20  Nutritional Goals, RD Estimated Needs Total Energy Estimated Needs: 2350-2550 Total Protein Estimated Needs: 125-145 grams Total Fluid Estimated Needs: >/= 2.0 L  Current Nutrition:  TPN Vital 1.5 at 40 ml/hr per RN (although charting is at goal rate of 65 ml/hr) Prosource TID - 3 charted given yesterday  Plan:  TF to be increased to 50 ml/hr at 1000, then 60 ml/hr at 1800 and then goal by AM D/C TPN at  1800 as TF will meet > 60% of needs - informed Surgery Add folate 1mg  and thiamine 100mg  PT daily Continue resistant SSI Q4H Increase Novolog to 9 units Q4H - further adjustment per MD D/C TPN labs and nursing care orders  Tierrah Anastos D. 12/23/20, PharmD, BCPS, BCCCP 11/06/2020, 9:31 AM

## 2020-11-06 NOTE — Progress Notes (Signed)
Nutrition Follow-up  DOCUMENTATION CODES:   Severe malnutrition in context of acute illness/injury  INTERVENTION:   -Continue Vital 1.5 @ 50 ml/hr and advance by 10 ml/hr every 8 hours to goal rate of 65 ml/hr  45 ml Prosource TF TID  Tube feeding regimen at goal provides 2460 kcal, 138 grams of protein, and 1192 ml of H2O.  -Continue full liquid diet  -Magic cup TID with meals, each supplement provides 290 kcal and 9 grams of protein   NUTRITION DIAGNOSIS:   Severe Malnutrition related to acute illness (infected necrotic pancreatitis) as evidenced by moderate fat depletion, moderate muscle depletion, energy intake < or equal to 50% for > or equal to 5 days, percent weight loss (8.4% weight loss in 1.5 months).  Ongoing  GOAL:   Patient will meet greater than or equal to 90% of their needs  Progressing   MONITOR:   Diet advancement, Labs, Weight trends, TF tolerance, Skin, I & O's, Other (Comment) (TPN)  REASON FOR ASSESSMENT:   Consult Enteral/tube feeding initiation and management  ASSESSMENT:   55 year old male who presented to the ED on 9/08 with AMS and abdominal pain. Pt seen at [facility] 1 week PTA and was diagnosed with AKI and pancreatitis. PMH of HTN, gout, tobacco abuse, EtOH abuse. Pt admitted with sepsis secondary to infected necrotic pancreatitis.  9/09 - s/p ex-lap with pancreatic debridement for infected necrotic pancreatitis 9/10 - TPN initiated 9/12 - TPN increased to goal rate, extubated 9/14 - NGT clamping trials, NGT removed by pt 9/15 - SLP evaluation with recommendations for NPO except ice chips 9/16 - Cortrak placed for administration of contrast for CT (tip gastric) 9/19 - Cortrak tube advanced post-pyloric, trickle TF initiated 9/20 - emesis, TF held 9/21 - trickle TF restarted 9/23 - TPN at half rate, starting to advance TF to goal 9/24- TPN d/c, s/p BSE- advanced to full liquid diet  Reviewed I/O's: -1.1 L x 24 hours and +3.1 L since  10/23/20  UOP: 1 L x 24 hours  Drain output: 150 ml x 24 hours   Pt unavailable at time of visit. Attempted to speak with pt via call to hospital room phone, however, unable to reach.  TPN was d/c yesterday. SLP has seen pt and advanced him to a full liquid diet. No meal completions currently documented at this time. Pt is very eager to eat.   TF currently infusing at 50 ml/hr. General surgery is requesting advancement to TF to goal rate (65 ml/hr). Pt will be advanced to goal rate by today. Will continue with titration to promote tolerance.   Medications reviewed and include folic acid, lasix, and thiamine  Labs reviewed: CBGS: 192-207 (inpatient orders for glycemic control are 0-20 units insulin aspart every 4 hours and 9 units insulin aspart every 4 hours).    Diet Order:   Diet Order             Diet full liquid Room service appropriate? Yes; Fluid consistency: Thin  Diet effective now                   EDUCATION NEEDS:   No education needs have been identified at this time  Skin:  Skin Assessment: Skin Integrity Issues: Skin Integrity Issues:: Stage I, Incisions Stage I: coccyx Incisions: abdomen  Last BM:  11/05/20  Height:   Ht Readings from Last 1 Encounters:  10/26/2020 5\' 9"  (1.753 m)    Weight:   Wt Readings from Last  1 Encounters:  11/04/20 100.2 kg   BMI:  Body mass index is 32.62 kg/m.  Estimated Nutritional Needs:   Kcal:  1438-8875  Protein:  125-145 grams  Fluid:  >/= 2.0 L    Levada Schilling, RD, LDN, CDCES Registered Dietitian II Certified Diabetes Care and Education Specialist Please refer to [facility] for RD and/or RD on-call/weekend/after hours pager

## 2020-11-07 DIAGNOSIS — D539 Nutritional anemia, unspecified: Secondary | ICD-10-CM | POA: Diagnosis not present

## 2020-11-07 DIAGNOSIS — E8809 Other disorders of plasma-protein metabolism, not elsewhere classified: Secondary | ICD-10-CM | POA: Diagnosis not present

## 2020-11-07 DIAGNOSIS — K8591 Acute pancreatitis with uninfected necrosis, unspecified: Secondary | ICD-10-CM | POA: Diagnosis not present

## 2020-11-07 DIAGNOSIS — A419 Sepsis, unspecified organism: Secondary | ICD-10-CM | POA: Diagnosis not present

## 2020-11-07 LAB — COMPREHENSIVE METABOLIC PANEL
ALT: 15 U/L (ref 0–44)
AST: 24 U/L (ref 15–41)
Albumin: 1.9 g/dL — ABNORMAL LOW (ref 3.5–5.0)
Alkaline Phosphatase: 82 U/L (ref 38–126)
Anion gap: 7 (ref 5–15)
BUN: 16 mg/dL (ref 6–20)
CO2: 29 mmol/L (ref 22–32)
Calcium: 8.1 mg/dL — ABNORMAL LOW (ref 8.9–10.3)
Chloride: 100 mmol/L (ref 98–111)
Creatinine, Ser: 0.65 mg/dL (ref 0.61–1.24)
GFR, Estimated: >60 mL/min (ref >60)
Glucose, Bld: 155 mg/dL — ABNORMAL HIGH (ref 70–99)
Potassium: 3.9 mmol/L (ref 3.5–5.1)
Sodium: 136 mmol/L (ref 135–145)
Total Bilirubin: 0.7 mg/dL (ref 0.3–1.2)
Total Protein: 5.9 g/dL — ABNORMAL LOW (ref 6.5–8.1)

## 2020-11-07 LAB — GLUCOSE, CAPILLARY
Glucose-Capillary: 143 mg/dL — ABNORMAL HIGH (ref 70–99)
Glucose-Capillary: 162 mg/dL — ABNORMAL HIGH (ref 70–99)
Glucose-Capillary: 163 mg/dL — ABNORMAL HIGH (ref 70–99)
Glucose-Capillary: 166 mg/dL — ABNORMAL HIGH (ref 70–99)
Glucose-Capillary: 90 mg/dL (ref 70–99)
Glucose-Capillary: 96 mg/dL (ref 70–99)

## 2020-11-07 LAB — CK: Total CK: 10 U/L — ABNORMAL LOW (ref 49–397)

## 2020-11-07 LAB — AMMONIA: Ammonia: 38 umol/L — ABNORMAL HIGH (ref 9–35)

## 2020-11-07 LAB — BRAIN NATRIURETIC PEPTIDE: B Natriuretic Peptide: 87.2 pg/mL (ref 0.0–100.0)

## 2020-11-07 LAB — PHOSPHORUS: Phosphorus: 3.2 mg/dL (ref 2.5–4.6)

## 2020-11-07 LAB — MAGNESIUM: Magnesium: 1.8 mg/dL (ref 1.7–2.4)

## 2020-11-07 LAB — TSH: TSH: 8.574 u[IU]/mL — ABNORMAL HIGH (ref 0.350–4.500)

## 2020-11-07 MED ORDER — DEXTROSE 10 % IV SOLN
INTRAVENOUS | Status: DC
  Filled 2020-11-07 (×3): qty 1000

## 2020-11-07 NOTE — Plan of Care (Signed)
?  Problem: Respiratory: ?Goal: Ability to maintain a clear airway and adequate ventilation will improve ?Outcome: Progressing ?  ?Problem: Education: ?Goal: Knowledge of General Education information will improve ?Description: Including pain rating scale, medication(s)/side effects and non-pharmacologic comfort measures ?Outcome: Progressing ?  ?Problem: Health Behavior/Discharge Planning: ?Goal: Ability to manage health-related needs will improve ?Outcome: Progressing ?  ?Problem: Clinical Measurements: ?Goal: Ability to maintain clinical measurements within normal limits will improve ?Outcome: Progressing ?Goal: Will remain free from infection ?Outcome: Progressing ?Goal: Diagnostic test results will improve ?Outcome: Progressing ?Goal: Respiratory complications will improve ?Outcome: Progressing ?Goal: Cardiovascular complication will be avoided ?Outcome: Progressing ?  ?Problem: Activity: ?Goal: Risk for activity intolerance will decrease ?Outcome: Progressing ?  ?Problem: Nutrition: ?Goal: Adequate nutrition will be maintained ?Outcome: Progressing ?  ?Problem: Coping: ?Goal: Level of anxiety will decrease ?Outcome: Progressing ?  ?Problem: Elimination: ?Goal: Will not experience complications related to bowel motility ?Outcome: Progressing ?Goal: Will not experience complications related to urinary retention ?Outcome: Progressing ?  ?Problem: Pain Managment: ?Goal: General experience of comfort will improve ?Outcome: Progressing ?  ?Problem: Safety: ?Goal: Ability to remain free from injury will improve ?Outcome: Progressing ?  ?Problem: Skin Integrity: ?Goal: Risk for impaired skin integrity will decrease ?Outcome: Progressing ?  ?

## 2020-11-07 NOTE — Progress Notes (Signed)
NGT feedings stopped and pt made strictly NPO per new orders. Pt informed

## 2020-11-07 NOTE — Plan of Care (Signed)
  Problem: Respiratory: Goal: Ability to maintain a clear airway and adequate ventilation will improve 11/07/2020 1516 by Mamie Nick I, RN Outcome: Progressing 11/07/2020 1454 by Mamie Nick I, RN Outcome: Progressing   Problem: Education: Goal: Knowledge of General Education information will improve Description: Including pain rating scale, medication(s)/side effects and non-pharmacologic comfort measures 11/07/2020 1516 by Mamie Nick I, RN Outcome: Progressing 11/07/2020 1454 by Mamie Nick I, RN Outcome: Progressing   Problem: Health Behavior/Discharge Planning: Goal: Ability to manage health-related needs will improve 11/07/2020 1516 by Mamie Nick I, RN Outcome: Progressing 11/07/2020 1454 by Mamie Nick I, RN Outcome: Progressing   Problem: Clinical Measurements: Goal: Ability to maintain clinical measurements within normal limits will improve 11/07/2020 1516 by Mamie Nick I, RN Outcome: Progressing 11/07/2020 1454 by Mamie Nick I, RN Outcome: Progressing Goal: Will remain free from infection 11/07/2020 1516 by Mamie Nick I, RN Outcome: Progressing 11/07/2020 1454 by Mamie Nick I, RN Outcome: Progressing Goal: Diagnostic test results will improve 11/07/2020 1516 by Mamie Nick I, RN Outcome: Progressing 11/07/2020 1454 by Mamie Nick I, RN Outcome: Progressing Goal: Respiratory complications will improve 11/07/2020 1516 by Mamie Nick I, RN Outcome: Progressing 11/07/2020 1454 by Mamie Nick I, RN Outcome: Progressing Goal: Cardiovascular complication will be avoided 11/07/2020 1516 by Mamie Nick I, RN Outcome: Progressing 11/07/2020 1454 by Mamie Nick I, RN Outcome: Progressing   Problem: Activity: Goal: Risk for activity intolerance will decrease 11/07/2020 1516 by Mamie Nick I, RN Outcome: Progressing 11/07/2020 1454 by Mamie Nick I, RN Outcome:  Progressing   Problem: Nutrition: Goal: Adequate nutrition will be maintained 11/07/2020 1516 by Mamie Nick I, RN Outcome: Progressing 11/07/2020 1454 by Mamie Nick I, RN Outcome: Progressing   Problem: Coping: Goal: Level of anxiety will decrease 11/07/2020 1516 by Mamie Nick I, RN Outcome: Progressing 11/07/2020 1454 by Mamie Nick I, RN Outcome: Progressing   Problem: Elimination: Goal: Will not experience complications related to bowel motility 11/07/2020 1516 by Mamie Nick I, RN Outcome: Progressing 11/07/2020 1454 by Mamie Nick I, RN Outcome: Progressing Goal: Will not experience complications related to urinary retention 11/07/2020 1516 by Mamie Nick I, RN Outcome: Progressing 11/07/2020 1454 by Mamie Nick I, RN Outcome: Progressing   Problem: Pain Managment: Goal: General experience of comfort will improve 11/07/2020 1516 by Mamie Nick I, RN Outcome: Progressing 11/07/2020 1454 by Mamie Nick I, RN Outcome: Progressing   Problem: Safety: Goal: Ability to remain free from injury will improve 11/07/2020 1516 by Mamie Nick I, RN Outcome: Progressing 11/07/2020 1454 by Mamie Nick I, RN Outcome: Progressing   Problem: Skin Integrity: Goal: Risk for impaired skin integrity will decrease 11/07/2020 1516 by Mamie Nick I, RN Outcome: Progressing 11/07/2020 1454 by Mamie Nick I, RN Outcome: Progressing

## 2020-11-07 NOTE — Progress Notes (Signed)
Id brief note   Still febrile Just changed levo/flagyl to piptazo 9/24 for slightly better coverage Abd ct with perihepatic fluid which surgery had reviewed and asked ir to drain   -continue piptazo -will repeat bcx -discussed with surgery to send fresh fluid culture (to be done tomorrow the drain placement)

## 2020-11-07 NOTE — Progress Notes (Signed)
HOSPITAL MEDICINE OVERNIGHT EVENT NOTE    Nursing is notified me the patient is exhibiting an elevated mews score.  Patient is exhibiting a fever of 102.2 F with ongoing tachycardia.  Chart reviewed, patient currently being managed for infected necrotizing pancreatitis with pneumoperitoneum and retroperitoneal fluid collection status post exploratory laparotomy and drain placement on 9/9.  Patient has just been restarted on Zosyn by the day team.  Of note, patient has no new complaints and is hemodynamically stable.  Nursing to administer Tylenol.  No change in antibiotics at this time.  We will continue to closely clinically monitor.  Marinda Elk  MD [facility]

## 2020-11-07 NOTE — Progress Notes (Signed)
17 Days Post-Op  Subjective: CC: Patient reports fullness and right sided abdominal pain after taking in liquids yesterday. No n/v. Continues to pass flatus. Liquid bm this am.   Seen by ID. CT A/P obtained yesterday. This showed Bilateral pleural effusions w/ atelectasis or infiltrates of both lower lobes; Grossly stable gas and fluid collections are seen throughout the retroperitoneum which are drained by JP tubes; and mildly increased amount of fluid is noted around the liver with an increased amount of air seen in the non dependent portion concerning for bowel leak. Febrile to 102.2 overnight. Remains tachycardic. He was made npo and TF's was held.   Objective: Vital signs in last 24 hours: Temp:  [97.9 F (36.6 C)-102.2 F (39 C)] 98.5 F (36.9 C) (09/25 0249) Pulse Rate:  [106-115] 108 (09/25 0730) Resp:  [18-22] 18 (09/25 0730) BP: (125-133)/(76-80) 132/76 (09/25 0730) SpO2:  [80 %-95 %] 92 % (09/25 0730) Last BM Date: 11/06/20  Intake/Output from previous day: 09/24 0701 - 09/25 0700 In: 2623 [P.O.:240; I.V.:1097.2; NG/GT:150; IV Piggyback:1135.9] Out: 1155 [Urine:1000; Drains:155] D1 -50 D2 - 105 Intake/Output this shift: No intake/output data recorded.  PE: Gen:  Awake and alert, sitting up in chair in nad Heart: Tachycardic  Pulm:  Distant breath sounds throughout with coarse breath sounds at the bases. On o2 Abd: Distended but soft, tenderness at the top of his midline incision and in the RUQ without rigidity or guarding. RUQ drains x2 with brown fluid in bulbs. Midline wound pale pink with early granulation, and clean without dehiscence. Laparoscopic site c/d/i Msk: 2+ pitting edema of the b/l LE's   Lab Results:  Recent Labs    11/05/20 0345 11/06/20 0620  WBC 8.6 10.7*  HGB 8.0* 8.5*  HCT 25.0* 27.1*  PLT 215 235   BMET Recent Labs    11/06/20 0617 11/07/20 0440  NA 139 136  K 3.8 3.9  CL 105 100  CO2 27 29  GLUCOSE 215* 155*  BUN 17 16   CREATININE 0.51* 0.65  CALCIUM 7.9* 8.1*   PT/INR No results for input(s): LABPROT, INR in the last 72 hours. CMP     Component Value Date/Time   NA 136 11/07/2020 0440   K 3.9 11/07/2020 0440   CL 100 11/07/2020 0440   CO2 29 11/07/2020 0440   GLUCOSE 155 (H) 11/07/2020 0440   BUN 16 11/07/2020 0440   CREATININE 0.65 11/07/2020 0440   CALCIUM 8.1 (L) 11/07/2020 0440   PROT 5.9 (L) 11/07/2020 0440   ALBUMIN 1.9 (L) 11/07/2020 0440   AST 24 11/07/2020 0440   ALT 15 11/07/2020 0440   ALKPHOS 82 11/07/2020 0440   BILITOT 0.7 11/07/2020 0440   GFRNONAA >60 11/07/2020 0440   Lipase     Component Value Date/Time   LIPASE 25 11/07/2020 1506    Studies/Results: CT ABDOMEN PELVIS W CONTRAST  Result Date: 11/06/2020 CLINICAL DATA:  Abdominal abscess or infection. EXAM: CT ABDOMEN AND PELVIS WITH CONTRAST TECHNIQUE: Multidetector CT imaging of the abdomen and pelvis was performed using the standard protocol following bolus administration of intravenous contrast. CONTRAST:  51mL OMNIPAQUE IOHEXOL 300 MG/ML  SOLN COMPARISON:  October 29, 2020. FINDINGS: Lower chest: Bilateral pleural effusions are noted with adjacent atelectasis or infiltrates of both lower lobes. Hepatobiliary: No focal liver abnormality is seen. No gallstones, gallbladder wall thickening, or biliary dilatation. Pancreas: Unremarkable. No pancreatic ductal dilatation or surrounding inflammatory changes. Spleen: Normal in size without focal abnormality. Adrenals/Urinary Tract:  Adrenal glands are unremarkable. Kidneys are normal, without renal calculi, focal lesion, or hydronephrosis. Bladder is unremarkable. Stomach/Bowel: Feeding tube is seen passing through the stomach in the duodenum with distal tip in expected position of proximal jejunum. Stomach is otherwise unremarkable. No abnormal bowel dilatation or inflammation is noted. The appendix appears normal. Vascular/Lymphatic: No significant vascular findings are present.  No enlarged abdominal or pelvic lymph nodes. Reproductive: Prostate is unremarkable. Other: Stable position of 2 surgical drains entering right lower quadrant, with 1 tip seen inferior to the liver and the other tip extending into the left upper quadrant inferior to proximal stomach. Grossly stable gas and fluid collections are seen in the retroperitoneum which are drain by these tubes. There is an increased amount of fluid noted around the liver with an increased amount of gas in its non dependent portion suggesting persistent bowel leak. Remains a large amount of fluid and gas in a collection in the left pericolic gutter. Musculoskeletal: No acute or significant osseous findings. IMPRESSION: Bilateral pleural effusions are again noted with adjacent atelectasis or infiltrates of both lower lobes. Stable position of 2 surgical drains entering right lower quadrant, with tip seen inferior to the liver and inferior to the proximal stomach in the left upper quadrant. Grossly stable gas and fluid collections are seen throughout the retroperitoneum which are drain by these tubes. Stable gas and fluid collection is noted in left pericolic gutter. Mildly increased amount of fluid is noted around the liver with an increased amount of air seen in the non dependent portion concerning for persistent bowel leak. Distal tip of feeding tube appears to be in the proximal jejunum. Electronically Signed   By: Lupita Raider M.D.   On: 11/06/2020 18:58   DG Chest Port 1 View  Result Date: 11/06/2020 CLINICAL DATA:  Dyspnea EXAM: PORTABLE CHEST 1 VIEW COMPARISON:  11/04/2020 FINDINGS: Nasoenteric feeding tube extends into the upper abdomen beyond the margin of the examination. Lung volumes are small. Small bilateral pleural effusions are present. Retrocardiac opacification is present representing atelectasis, infiltrate, or posteriorly layering pleural fluid in this location. Right upper extremity PICC line tip noted within the  superior cavoatrial junction. Cardiac size is within normal limits. No acute bone abnormality. IMPRESSION: Stable support lines and tubes. Pulmonary hypoinflation. Small bilateral pleural effusions. Associated retrocardiac collapse and consolidation Electronically Signed   By: Helyn Numbers M.D.   On: 11/06/2020 03:48   VAS [place] LOWER EXTREMITY VENOUS (DVT)  Result Date: 11/05/2020  Lower Venous DVT Study Patient Name:  Paul Chambers  Date of Exam:   11/05/2020 Medical Rec #: [id number]     Accession #:    [id number] Date of Birth: 10-05-1965    Patient Gender: M Patient Age:   55 years Exam Location:  [facility] Procedure:      VAS [place] LOWER EXTREMITY VENOUS (DVT) Referring Phys: Jameisha Stofko --------------------------------------------------------------------------------  Indications: Edema.  Comparison Study: no prior Performing Technologist: [place] Ponder RVS  Examination Guidelines: A complete evaluation includes B-mode imaging, spectral Doppler, color Doppler, and power Doppler as needed of all accessible portions of each vessel. Bilateral testing is considered an integral part of a complete examination. Limited examinations for reoccurring indications may be performed as noted. The reflux portion of the exam is performed with the patient in reverse Trendelenburg.  +---------+---------------+---------+-----------+----------+--------------+ RIGHT    CompressibilityPhasicitySpontaneityPropertiesThrombus Aging +---------+---------------+---------+-----------+----------+--------------+ CFV      Full           Yes  Yes                                 +---------+---------------+---------+-----------+----------+--------------+ SFJ      Full                                                        +---------+---------------+---------+-----------+----------+--------------+ FV Prox  Full                                                         +---------+---------------+---------+-----------+----------+--------------+ FV Mid   Full                                                        +---------+---------------+---------+-----------+----------+--------------+ FV DistalFull                                                        +---------+---------------+---------+-----------+----------+--------------+ PFV      Full                                                        +---------+---------------+---------+-----------+----------+--------------+ POP      Full           Yes      Yes                                 +---------+---------------+---------+-----------+----------+--------------+ PTV      Full                                                        +---------+---------------+---------+-----------+----------+--------------+ PERO     Full                                                        +---------+---------------+---------+-----------+----------+--------------+   +---------+---------------+---------+-----------+----------+--------------+ LEFT     CompressibilityPhasicitySpontaneityPropertiesThrombus Aging +---------+---------------+---------+-----------+----------+--------------+ CFV      Full           Yes      Yes                                 +---------+---------------+---------+-----------+----------+--------------+ SFJ      Full                                                        +---------+---------------+---------+-----------+----------+--------------+  FV Prox  Full                                                        +---------+---------------+---------+-----------+----------+--------------+ FV Mid   Full                                                        +---------+---------------+---------+-----------+----------+--------------+ FV DistalFull                                                         +---------+---------------+---------+-----------+----------+--------------+ PFV      Full                                                        +---------+---------------+---------+-----------+----------+--------------+ POP      Full           Yes      Yes                                 +---------+---------------+---------+-----------+----------+--------------+ PTV      Full                                                        +---------+---------------+---------+-----------+----------+--------------+ PERO     Full                                                        +---------+---------------+---------+-----------+----------+--------------+     Summary: BILATERAL: - No evidence of deep vein thrombosis seen in the lower extremities, bilaterally. -No evidence of popliteal cyst, bilaterally.   *See table(s) above for measurements and observations. Electronically signed by Lemar Livings MD on 11/05/2020 at 3:03:21 PM.    Final     Anti-infectives: Anti-infectives (From admission, onward)    Start     Dose/Rate Route Frequency Ordered Stop   11/06/20 1545  piperacillin-tazobactam (ZOSYN) IVPB 3.375 g        3.375 g 12.5 mL/hr over 240 Minutes Intravenous Every 8 hours 11/06/20 1453     11/01/20 0900  levofloxacin (LEVAQUIN) IVPB 750 mg  Status:  Discontinued        750 mg 100 mL/hr over 90 Minutes Intravenous Every 24 hours 11/01/20 0824 11/06/20 1408   11/01/20 0900  metroNIDAZOLE (FLAGYL) IVPB 500 mg  Status:  Discontinued        500 mg 100 mL/hr over 60 Minutes Intravenous Every 12 hours 11/01/20 0824 11/06/20 1408  10/29/20 2200  meropenem (MERREM) 1 g in sodium chloride 0.9 % 100 mL IVPB  Status:  Discontinued        1 g 200 mL/hr over 30 Minutes Intravenous Every 8 hours 10/29/20 1428 11/01/20 0824   10/28/20 1615  meropenem (MERREM) 2 g in sodium chloride 0.9 % 100 mL IVPB  Status:  Discontinued        2 g 200 mL/hr over 30 Minutes Intravenous Every 8 hours  10/28/20 1515 10/29/20 1428   10/28/20 1445  metroNIDAZOLE (FLAGYL) IVPB 500 mg  Status:  Discontinued        500 mg 100 mL/hr over 60 Minutes Intravenous Every 12 hours 10/28/20 1348 10/28/20 1505   10/25/20 2300  fluconazole (DIFLUCAN) IVPB 400 mg        400 mg 100 mL/hr over 120 Minutes Intravenous Every 24 hours 10/25/20 0742 10/29/20 0718   10/24/20 2300  fluconazole (DIFLUCAN) IVPB 200 mg  Status:  Discontinued        200 mg 100 mL/hr over 60 Minutes Intravenous Every 24 hours 10/24/20 0943 10/25/20 0742   10/22/20 1800  vancomycin (VANCOREADY) IVPB 1500 mg/300 mL  Status:  Discontinued        1,500 mg 150 mL/hr over 120 Minutes Intravenous Every 24 hours 11/01/2020 2225 10/22/20 0759   10/22/20 0900  linezolid (ZYVOX) IVPB 600 mg  Status:  Discontinued        600 mg 300 mL/hr over 60 Minutes Intravenous Every 12 hours 10/22/20 0800 10/27/20 0852   10/22/20 0300  piperacillin-tazobactam (ZOSYN) IVPB 3.375 g  Status:  Discontinued        3.375 g 12.5 mL/hr over 240 Minutes Intravenous Every 8 hours Nov 01, 2020 2225 10/28/20 1514   2020-11-01 2335  metroNIDAZOLE (FLAGYL) IVPB 500 mg  Status:  Discontinued        500 mg 100 mL/hr over 60 Minutes Intravenous Every 12 hours 11/01/2020 2142 10/22/20 0747   11/01/2020 2248  fluconazole (DIFLUCAN) IVPB 400 mg  Status:  Discontinued        400 mg 100 mL/hr over 120 Minutes Intravenous Every 24 hours 2020-11-01 2142 10/24/20 0943   2020-11-01 1748  vancomycin (VANCOREADY) IVPB 2000 mg/400 mL        2,000 mg 200 mL/hr over 120 Minutes Intravenous  Once 11-01-2020 1706 01-Nov-2020 2113   Nov 01, 2020 1715  piperacillin-tazobactam (ZOSYN) IVPB 3.375 g        3.375 g 100 mL/hr over 30 Minutes Intravenous  Once 11/01/2020 1706 01-Nov-2020 1903        Assessment/Plan POD 16 s/p ex lap with pancreatic debridement for infected necrotic pancreatitis by Dr. Sheliah Hatch on 9/9 - Superior drain under the mesocolon along with the head of the pancreas - Inferior drain under  the mesocolon along the left upper abdomen and what is likely a position inferior to the pancreas - CT 9/24 w/ gas and fluid collections that are grossly stable in the retroperitoneum and are drained by JP tubes. Cont drains - CT 9/24 also with mildly increased amount of fluid is noted around the liver with an increased amount of air seen in the non dependent portion concerning for bowel leak. Febrile to 102.2 overnight. Remains tachycardic. Npo and TF's held. Reviewed scan with my attending. Will ask IR to aspirate/drain to determine characteristic of fluid - BID WTD - Cont therapies, rec CIR who is following - Pulm toilet - Appreciate TRH's and ID's assistance w/ his care   FEN: NPO, TF's  held ID: Zosyn VTE: SCDs, heparin subq. LE [place] neg for DVT's Foley - out, ext cath, voiding    - Per primary -  VDRF - now extubated PNA w/ + resp cx's - cx's w/ stenotrophomonas maltophilia - on abx B/l pleural effusions Abl anemia  ETOH use  AKI - resolved T2DM   LOS: 17 days    Jacinto Halim , [facility] 11/07/2020, 8:20 AM Please see Amion for pager number during day hours 7:00am-4:30pm

## 2020-11-07 NOTE — Progress Notes (Signed)
Pt with a 102.2 ax  temp HR 111 and oxygen sat 80% discovered pt was on rm air instead of oxygen. Oxygen connected and oxygen increased to 6 l/M [place]. Pt given 650 mg Tylenol per NGT. MEWS score is Red. Doctor Shalhoub paged and Ray RN CN notified of situation. Will continue to monitor

## 2020-11-07 NOTE — H&P (Signed)
Chief Complaint: Necrotizing Pancreatitis  Referring Physician(s): Maczis, Elmer Sow, PA-C  Supervising Physician: Simonne Come  Patient Status: [facility] - In-pt  History of Present Illness: Paul Chambers is a 55 y.o. male Paul Chambers is a 55 y.o. male with medical issues including alcoholism, HTN, and obesity.   He was admitted 10/21/2020 for abdominal pain.   CT abdomen showed an impressive pneumoperitoneum without an obvious location of perforation.   He underwent exporatory laparotomy with pancreatic debridement and placement of 2 drains= - Superior drain under the mesocolon along with the head of the pancreas - Inferior drain under the mesocolon along the left upper abdomen and what is likely a position inferior to the pancreas   Per chart review, it appears he started to slowly improve until he developed fevers again.   CT scan done yesterday showed=   Stable position of 2 surgical drains entering right lower quadrant, with tip seen inferior to the liver and inferior to the proximal stomach in the left upper quadrant. Grossly stable gas and fluid collections are seen throughout the retroperitoneum which are drain by these tubes. Stable gas and fluid collection is noted in left pericolic gutter.   Mildly increased amount of fluid is noted around the liver with an increased amount of air seen in the non dependent portion concerning for persistent bowel leak.  We are asked to evaluate for placement of additional drains.  Past Medical History:  Diagnosis Date   Alcoholism (HCC)    Gout    Hypertension    Pancreatitis    Tobacco abuse     Past Surgical History:  Procedure Laterality Date   LAPAROSCOPY N/A 10/20/2020   Procedure: LAPAROSCOPY DIAGNOSTIC;  Surgeon: Sheliah Hatch, De Blanch, MD;  Location: [facility] OR;  Service: General;  Laterality: N/A;   LAPAROTOMY N/A 10/31/2020   Procedure: EXPLORATORY LAPAROTOMY AND PANCREATIC DEBRIDEMENT;  Surgeon: Sheliah Hatch De Blanch,  MD;  Location: [facility] OR;  Service: General;  Laterality: N/A;    Allergies: Patient has no known allergies.  Medications: Prior to Admission medications   Medication Sig Start Date End Date Taking? Authorizing Provider  acetaminophen (TYLENOL) 500 MG tablet Take 500 mg by mouth every 6 (six) hours as needed for mild pain.   Yes [provider]  albuterol (VENTOLIN HFA) 108 (90 Base) MCG/ACT inhaler Inhale 1-2 puffs into the lungs every 6 (six) hours as needed for wheezing or shortness of breath.   Yes [provider]  allopurinol (ZYLOPRIM) 300 MG tablet Take 300 mg by mouth daily.   Yes [provider]  ALPRAZolam Prudy Feeler) 0.5 MG tablet Take 0.5 mg by mouth 3 (three) times daily as needed for anxiety.   Yes [provider]  amLODipine (NORVASC) 5 MG tablet Take 5 mg by mouth daily.   Yes [provider]  Fluticasone-Umeclidin-Vilant (TRELEGY ELLIPTA) 100-62.5-25 MCG/INH AEPB Inhale 1 puff into the lungs daily.   Yes [provider]  folic acid (FOLVITE) 1 MG tablet Take 1 mg by mouth daily.   Yes [provider]  furosemide (LASIX) 40 MG tablet Take 40 mg by mouth 2 (two) times daily.   Yes [provider]  Multiple Vitamins-Minerals (PRESERVISION AREDS 2+MULTI VIT PO) Take 2 tablets by mouth daily.   Yes [provider]  Omega-3 Krill Oil 500 MG CAPS Take 1,000 mg by mouth daily.   Yes [provider]  omeprazole (PRILOSEC) 40 MG capsule Take 40 mg by mouth daily.   Yes  [provider]  oxyCODONE (OXY IR/ROXICODONE) 5 MG immediate release tablet Take 2.5-5 mg by mouth every 6 (six) hours as needed for severe pain.   Yes [provider]  thiamine (VITAMIN B-1) 100 MG tablet Take 100 mg by mouth daily.   Yes [provider]     Family History  Problem Relation Age of Onset   Diverticulitis Mother    Liver cancer Maternal Aunt        cholangiocarcinoma    Social History    Socioeconomic History   Marital status: Married    Spouse name: Not on file   Number of children: Not on file   Years of education: Not on file   Highest education level: Not on file  Occupational History   Not on file  Tobacco Use   Smoking status: Every Day    Types: Cigarettes   Smokeless tobacco: Not on file  Substance and Sexual Activity   Alcohol use: Not Currently    Comment: quit drinking whiskey 2 weeks ago   Drug use: Not on file   Sexual activity: Not on file  Other Topics Concern   Not on file  Social History Narrative   Not on file   Social Determinants of Health   Financial Resource Strain: Not on file  Food Insecurity: Not on file  Transportation Needs: Not on file  Physical Activity: Not on file  Stress: Not on file  Social Connections: Not on file     Review of Systems: A 12 point ROS discussed and pertinent positives are indicated in the HPI above.  All other systems are negative.  Review of Systems  Vital Signs: BP 121/80 (BP Location: Left Arm)   Pulse (!) 104   Temp 98.9 F (37.2 C) (Oral)   Resp 18   Ht 5\' 9"  (1.753 m)   Wt 100.2 kg   SpO2 93%   BMI 32.62 kg/m   Physical Exam Constitutional:      Appearance: He is ill-appearing.  HENT:     Head: Normocephalic and atraumatic.  Cardiovascular:     Rate and Rhythm: Normal rate and regular rhythm.  Pulmonary:     Effort: Pulmonary effort is normal. No respiratory distress.  Abdominal:     Palpations: Abdomen is soft.     Comments: 2 Large bore drains present with purulent drainage.  Skin:    General: Skin is warm and dry.  Neurological:     General: No focal deficit present.     Mental Status: He is alert and oriented to person, place, and time.  Psychiatric:        Mood and Affect: Mood normal.        Behavior: Behavior normal.        Thought Content: Thought content normal.        Judgment: Judgment normal.    Imaging: DG Abd 1 View  Result Date: 10/25/2020 CLINICAL  DATA:  Insert tube placement EXAM: ABDOMEN - 1 VIEW COMPARISON:  10/27/20 CT abdomen/pelvis FINDINGS: Enteric tube terminates in the proximal stomach with side port in the proximal stomach. Superior approach central venous catheter terminates at the cavoatrial junction. Surgical drains terminate in the medial upper abdomen bilaterally. Small amount of scattered retroperitoneal gas noted in the upper left abdomen as seen on prior CT. Small left pleural effusion. IMPRESSION: Enteric tube terminates in the proximal stomach. Electronically Signed   By: 12/21/2020 M.D.   On: 10/25/2020 13:53   CT  Head Wo Contrast  Result Date: 11/06/2020 CLINICAL DATA:  Altered mental status. EXAM: CT HEAD WITHOUT CONTRAST TECHNIQUE: Contiguous axial images were obtained from the base of the skull through the vertex without intravenous contrast. COMPARISON:  None. FINDINGS: Brain: No evidence of acute infarction, hemorrhage, hydrocephalus, extra-axial collection or mass lesion/mass effect. Vascular: No hyperdense vessel or unexpected calcification. Skull: Normal. Negative for fracture or focal lesion. Sinuses/Orbits: No acute finding. Other: None. IMPRESSION: No acute intracranial abnormality seen. Electronically Signed   By: Lupita Raider M.D.   On: 11/10/2020 18:56   CT ABDOMEN PELVIS W CONTRAST  Result Date: 11/06/2020 CLINICAL DATA:  Abdominal abscess or infection. EXAM: CT ABDOMEN AND PELVIS WITH CONTRAST TECHNIQUE: Multidetector CT imaging of the abdomen and pelvis was performed using the standard protocol following bolus administration of intravenous contrast. CONTRAST:  80mL OMNIPAQUE IOHEXOL 300 MG/ML  SOLN COMPARISON:  October 29, 2020. FINDINGS: Lower chest: Bilateral pleural effusions are noted with adjacent atelectasis or infiltrates of both lower lobes. Hepatobiliary: No focal liver abnormality is seen. No gallstones, gallbladder wall thickening, or biliary dilatation. Pancreas: Unremarkable. No pancreatic  ductal dilatation or surrounding inflammatory changes. Spleen: Normal in size without focal abnormality. Adrenals/Urinary Tract: Adrenal glands are unremarkable. Kidneys are normal, without renal calculi, focal lesion, or hydronephrosis. Bladder is unremarkable. Stomach/Bowel: Feeding tube is seen passing through the stomach in the duodenum with distal tip in expected position of proximal jejunum. Stomach is otherwise unremarkable. No abnormal bowel dilatation or inflammation is noted. The appendix appears normal. Vascular/Lymphatic: No significant vascular findings are present. No enlarged abdominal or pelvic lymph nodes. Reproductive: Prostate is unremarkable. Other: Stable position of 2 surgical drains entering right lower quadrant, with 1 tip seen inferior to the liver and the other tip extending into the left upper quadrant inferior to proximal stomach. Grossly stable gas and fluid collections are seen in the retroperitoneum which are drain by these tubes. There is an increased amount of fluid noted around the liver with an increased amount of gas in its non dependent portion suggesting persistent bowel leak. Remains a large amount of fluid and gas in a collection in the left pericolic gutter. Musculoskeletal: No acute or significant osseous findings. IMPRESSION: Bilateral pleural effusions are again noted with adjacent atelectasis or infiltrates of both lower lobes. Stable position of 2 surgical drains entering right lower quadrant, with tip seen inferior to the liver and inferior to the proximal stomach in the left upper quadrant. Grossly stable gas and fluid collections are seen throughout the retroperitoneum which are drain by these tubes. Stable gas and fluid collection is noted in left pericolic gutter. Mildly increased amount of fluid is noted around the liver with an increased amount of air seen in the non dependent portion concerning for persistent bowel leak. Distal tip of feeding tube appears to be  in the proximal jejunum. Electronically Signed   By: Lupita Raider M.D.   On: 11/06/2020 18:58   CT ABDOMEN PELVIS W CONTRAST  Result Date: 10/30/2020 CLINICAL DATA:  Abdominal pain. EXAM: CT ABDOMEN AND PELVIS WITH CONTRAST TECHNIQUE: Multidetector CT imaging of the abdomen and pelvis was performed using the standard protocol following bolus administration of intravenous contrast. CONTRAST:  OMNIPAQUE IOHEXOL 300 MG/ML  SOLN COMPARISON:  October 21, 2020 FINDINGS: Lower chest: Moderate to marked severity areas of consolidation are seen within the visualized portions of the bilateral lower lobes. This is increased in severity when compared to the prior study. There is a small  right pleural effusion. A moderate to large left pleural effusion is also seen. These are increased in size when compared to the prior exam. Hepatobiliary: No focal liver abnormality is seen. No gallstones, gallbladder wall thickening, or biliary dilatation. Pancreas: The pancreas remains poorly visualized. Spleen: Normal in size without focal abnormality. Adrenals/Urinary Tract: Adrenal glands are unremarkable. Kidneys are normal, without renal calculi, focal lesion, or hydronephrosis. Bladder is unremarkable. Stomach/Bowel: A nasogastric tube is in place. Stomach is within normal limits. Appendix appears normal. No evidence of bowel dilatation. Radiopaque contrast is seen throughout the large and small bowel without evidence of contrast extravasation. Two surgical drains are seen entering via the anterolateral aspect of the mid right abdominal wall. The distal tip of the first catheter sits just above the gallbladder fossa, while the distal tip of the second catheter is seen within the region posterior to the gastric fundus. A large amount of gas and fluid is again seen dissecting through the retroperitoneum. This extends from the posteromedial aspect of the left upper quadrant throughout the mid and left abdomen into the  posterior aspect of the pelvis and is decreased in severity when compared to the prior study. Vascular/Lymphatic: No significant vascular findings are present. No enlarged abdominal or pelvic lymph nodes. Reproductive: Prostate is unremarkable. Other: Two surgical drains are seen entering via the anterolateral aspect of the mid right abdominal wall. This represents a new finding. The distal tip of the first catheter sits just above the gallbladder fossa, while the distal tip of the second catheter is seen within the region posterior to the gastric fundus. A large amount of gas and fluid is again seen dissecting through the retroperitoneum. This extends from the posteromedial aspect of the left upper quadrant throughout the mid and left abdomen into the posterior aspect of the pelvis and is decreased in severity when compared to the prior study. Moderate severity diffuse mesenteric inflammatory fat stranding is noted, most prominent within the mid and upper abdomen. A mild-to-moderate amount of ascites is seen. Musculoskeletal: No acute or significant osseous findings. IMPRESSION: 1. Interval abdominal surgical drain placement and positioning, as described above, with a large amount of residual gas and fluid dissecting through the retroperitoneum. 2. Moderate to marked severity bilateral lower lobe consolidation with bilateral pleural effusions, left greater than right. 3. Mild to moderate amount of ascites. Electronically Signed   By: Aram Candela M.D.   On: 10/30/2020 00:25   CT ABDOMEN PELVIS W CONTRAST  Result Date: 10/22/2020 CLINICAL DATA:  Abdominal abscess/infection suspected EXAM: CT ABDOMEN AND PELVIS WITH CONTRAST TECHNIQUE: Multidetector CT imaging of the abdomen and pelvis was performed using the standard protocol following bolus administration of intravenous contrast. CONTRAST:  OMNIPAQUE IOHEXOL 350 MG/ML SOLN COMPARISON:  None. FINDINGS: Lower chest: Small left pleural effusion.  Bilateral lower lobe airspace opacities, left greater than right. Hepatobiliary: No focal hepatic abnormality. Gallbladder unremarkable. Pancreas: Pancreas is poorly visualized due to extensive fluid and gas dissecting throughout the retroperitoneum. Fluid in the region of the pancreatic body and tail. Spleen: No focal abnormality.  Normal size. Adrenals/Urinary Tract: No adrenal abnormality. No focal renal abnormality. No stones or hydronephrosis. Urinary bladder is unremarkable. Stomach/Bowel: There is extensive gas and fluid dissecting throughout the retroperitoneum. This continues into the peritoneum with pneumoperitoneum and ascites. This most likely reflects perforated hollow viscus. Given the degree of involvement of the retroperitoneum, I would suspect a retroperitoneal perforation, possibly perforated duodenal ulcer. Other possible source would be the descending duodenum although  no real concerning appearance of the descending duodenum. Less likely but possible would be perforated peritoneal bowel. Vascular/Lymphatic: No evidence of aneurysm or adenopathy. Reproductive: No visible focal abnormality. Other: Extensive retroperitoneal fluid and gas as well as pneumoperitoneum and ascites as described above. Musculoskeletal: No acute bony abnormality. IMPRESSION: Extensive gas and fluid dissecting throughout the retroperitoneum and likely extending into the peritoneum where there is pneumoperitoneum and ascites. Appearance is most compatible with perforated bowel, likely in the retroperitoneum with duodenal the most likely source although exact source is not readily apparent. Pancreas poorly visualized with fluid and gas in the region of the body and tail. While necrotizing pancreatitis could have this appearance, this is felt less likely given the extent of retroperitoneal gas and fluid and a normal lipase. Critical Value/emergent results were called by telephone at the time of interpretation on 11/10/2020 at  8:42 pm to provider Gerhard Munch , who verbally acknowledged these results. Electronically Signed   By: Charlett Nose M.D.   On: 10/31/2020 20:49   DG Chest Port 1 View  Result Date: 11/06/2020 CLINICAL DATA:  Dyspnea EXAM: PORTABLE CHEST 1 VIEW COMPARISON:  11/04/2020 FINDINGS: Nasoenteric feeding tube extends into the upper abdomen beyond the margin of the examination. Lung volumes are small. Small bilateral pleural effusions are present. Retrocardiac opacification is present representing atelectasis, infiltrate, or posteriorly layering pleural fluid in this location. Right upper extremity PICC line tip noted within the superior cavoatrial junction. Cardiac size is within normal limits. No acute bone abnormality. IMPRESSION: Stable support lines and tubes. Pulmonary hypoinflation. Small bilateral pleural effusions. Associated retrocardiac collapse and consolidation Electronically Signed   By: Helyn Numbers M.D.   On: 11/06/2020 03:48   DG CHEST PORT 1 VIEW  Result Date: 11/04/2020 CLINICAL DATA:  Hypoxia and altered mental status. EXAM: PORTABLE CHEST 1 VIEW COMPARISON:  October 31, 2020 FINDINGS: There is stable right-sided PICC line and nasogastric tube positioning. Mild, stable bibasilar atelectasis and/or infiltrate is seen. Small bilateral pleural effusions are noted, right greater than left. This is mildly increased in size on the right when compared to the prior study. No pneumothorax is identified the heart size and mediastinal contours are within normal limits. The visualized skeletal structures are unremarkable. IMPRESSION: 1. Mild, stable bibasilar atelectasis and/or infiltrate. 2. Small bilateral pleural effusions, right greater than left. This is mildly increased in size on the right when compared to the prior study. Electronically Signed   By: Aram Candela M.D.   On: 11/04/2020 15:21   DG CHEST PORT 1 VIEW  Result Date: 10/31/2020 CLINICAL DATA:  Hypoxia and sepsis. EXAM:  PORTABLE CHEST 1 VIEW COMPARISON:  Chest x-ray 10/28/2020, chest x-ray 11/01/2020, CT chest 10/29/2020, CT chest 11/02/2020 FINDINGS: Right PICC with tip overlying the expected region of the superior cavoatrial junction. Enteric tube coursing below the hemidiaphragm with tip collimated off view. The heart and mediastinal contours are unchanged. Slightly more prominent mass-like 2.6 cm opacity within the right lower lobe consistent with known infection. Left lower lobe retrocardiac opacity again noted with air bronchograms. No pulmonary edema. Persistent, possibly decreased in size, small left pleural effusion. Trace right pleural effusion. No pneumothorax. No acute osseous abnormality. IMPRESSION: Bilateral lower lobe airspace opacities with associated bilateral trace to small volume pleural effusions, left greater than right. Stable to possibly slightly improved left pleural effusion. Slightly increased right lower lobe airspace opacity. Followup PA and lateral chest X-ray is recommended in 3-4 weeks following therapy to ensure resolution and exclude underlying  malignancy. Right PICC in Electronically Signed   By: Tish Frederickson M.D.   On: 10/31/2020 15:17   DG CHEST PORT 1 VIEW  Result Date: 10/28/2020 CLINICAL DATA:  Dyspnea. EXAM: PORTABLE CHEST 1 VIEW COMPARISON:  October 22, 2020. FINDINGS: Stable cardiomegaly. Interval placement of right-sided PICC line with distal tip in expected position of cavoatrial junction. Right lung is clear. Endotracheal and nasogastric tubes have been removed. Left midlung and basilar opacity is noted concerning for pneumonia or atelectasis with associated pleural effusion. Bony thorax is unremarkable. IMPRESSION: Increased left lung opacity is noted concerning for pneumonia or atelectasis with associated left pleural effusion. Electronically Signed   By: Lupita Raider M.D.   On: 10/28/2020 12:33   DG CHEST PORT 1 VIEW  Result Date: 10/22/2020 CLINICAL DATA:  Central line  placement EXAM: PORTABLE CHEST 1 VIEW COMPARISON:  10/19/2020 FINDINGS: Endotracheal tube is 4.5 cm above the carina. Right central line tip in the SVC. No pneumothorax. NG tube is in the stomach. Small left pleural effusion. Bilateral lower lobe airspace opacities could reflect atelectasis or infiltrates. IMPRESSION: Support devices in expected position.  No pneumothorax. Bilateral lower lobe atelectasis or infiltrates. Small left effusion. Electronically Signed   By: Charlett Nose M.D.   On: 10/22/2020 02:23   DG Chest Port 1 View  Result Date: 11/10/2020 CLINICAL DATA:  sob EXAM: PORTABLE CHEST 1 VIEW COMPARISON:  None. FINDINGS: The cardiomediastinal silhouette is within normal limits. No pleural effusion. No pneumothorax. Left lung base consolidation. No acute osseous abnormality. IMPRESSION: Left lung base consolidation suggestive of pneumonia in the appropriate clinical context. Electronically Signed   By: Olive Bass M.D.   On: 10/19/2020 16:33   DG Abd Portable 1V  Result Date: 11/02/2020 CLINICAL DATA:  Abdominal pain for several days EXAM: PORTABLE ABDOMEN - 1 VIEW COMPARISON:  Film from the previous day. FINDINGS: Feeding catheter is again noted in the proximal aspect of the jejunum. Scattered large and small bowel gas is noted. Surgical drains are again seen consistent with the known history. No new focal abnormality is noted. IMPRESSION: Stable appearance of the abdomen when compare with the previous day. Surgical drains remain in place. Electronically Signed   By: Alcide Clever M.D.   On: 11/02/2020 15:24   DG Abd Portable 1V  Result Date: 11/01/2020 CLINICAL DATA:  Check feeding catheter placement EXAM: PORTABLE ABDOMEN - 1 VIEW COMPARISON:  10/29/2020 FINDINGS: Feeding catheter is noted extending into the fourth portion of the duodenum/proximal jejunum. Surgical drains are noted in place IMPRESSION: Feeding catheter in the distal duodenum/proximal jejunum. Electronically Signed   By:  Alcide Clever M.D.   On: 11/01/2020 15:49   DG Abd Portable 1V  Result Date: 10/29/2020 CLINICAL DATA:  Feeding tube placement. EXAM: PORTABLE ABDOMEN - 1 VIEW COMPARISON:  One-view abdomen 10/26/2020 FINDINGS: Tip of a small bore feeding tube is in the distal stomach. Are gas pattern is unremarkable. Heart is enlarged. Left lower lobe airspace disease is again seen. IMPRESSION: Tip of small bore feeding tube in the distal stomach. Electronically Signed   By: Marin Roberts M.D.   On: 10/29/2020 14:21   DG Abd Portable 1V  Result Date: 10/26/2020 CLINICAL DATA:  NG tube placement. EXAM: PORTABLE ABDOMEN - 1 VIEW COMPARISON:  Abdominal x-ray 10/25/2020. FINDINGS: Enteric tube tip terminates in the mid stomach. Lines overlie the mid abdomen, unchanged. No dilated bowel loops are visualized. IMPRESSION: 1. Enteric tube tip terminates in the mid stomach. Electronically Signed  By: Darliss Cheney M.D.   On: 10/26/2020 19:23   ECHOCARDIOGRAM COMPLETE  Result Date: 10/28/2020    ECHOCARDIOGRAM REPORT   Patient Name:   Paul Chambers Date of Exam: 10/28/2020 Medical Rec #:  [id number]    Height:       69.0 in Accession #:    [id number]   Weight:       225.3 lb Date of Birth:  1965-10-18   BSA:          2.173 m Patient Age:    54 years     BP:           132/84 mmHg Patient Gender: M            HR:           103 bpm. Exam Location:  Inpatient Procedure: 2D Echo Indications:    acute systolic chf  History:        Patient has no prior history of Echocardiogram examinations.                 Sepsis, Signs/Symptoms:Altered Mental Status; Risk                 Factors:Current Smoker.  Sonographer:    Delcie Roch RDCS Referring Phys: [id number] RAVI PAHWANI  Sonographer Comments: Image acquisition challenging due to uncooperative patient. IMPRESSIONS  1. Left ventricular ejection fraction, by estimation, is 65 to 70%. The left ventricle has normal function. The left ventricle has no regional wall motion  abnormalities. The left ventricular internal cavity size was mildly dilated. Left ventricular diastolic parameters were normal.  2. Right ventricular systolic function is normal. The right ventricular size is normal.  3. The mitral valve is normal in structure. Trivial mitral valve regurgitation.  4. The aortic valve is normal in structure. Aortic valve regurgitation is not visualized. FINDINGS  Left Ventricle: Left ventricular ejection fraction, by estimation, is 65 to 70%. The left ventricle has normal function. The left ventricle has no regional wall motion abnormalities. The left ventricular internal cavity size was mildly dilated. There is  no left ventricular hypertrophy. Left ventricular diastolic parameters were normal. Right Ventricle: The right ventricular size is normal. Right vetricular wall thickness was not assessed. Right ventricular systolic function is normal. Left Atrium: Left atrial size was normal in size. Right Atrium: Right atrial size was normal in size. Pericardium: There is no evidence of pericardial effusion. Mitral Valve: The mitral valve is normal in structure. Trivial mitral valve regurgitation. Tricuspid Valve: The tricuspid valve is normal in structure. Tricuspid valve regurgitation is trivial. Aortic Valve: The aortic valve is normal in structure. Aortic valve regurgitation is not visualized. Pulmonic Valve: The pulmonic valve was normal in structure. Pulmonic valve regurgitation is not visualized. Aorta: The aortic root and ascending aorta are structurally normal, with no evidence of dilitation. Venous: The inferior vena cava was not well visualized. IAS/Shunts: No atrial level shunt detected by color flow Doppler.  LEFT VENTRICLE PLAX 2D LVIDd:         5.40 cm  Diastology LVIDs:         3.50 cm  LV e' medial:    8.05 cm/s LV PW:         0.90 cm  LV E/e' medial:  10.2 LV IVS:        0.70 cm  LV e' lateral:   10.30 cm/s LVOT diam:     1.90 cm  LV E/e' lateral: 8.0 LV SV:  55 LV  SV Index:   25 LVOT Area:     2.84 cm  RIGHT VENTRICLE RV S prime:     20.10 cm/s TAPSE (M-mode): 1.8 cm LEFT ATRIUM           Index       RIGHT ATRIUM           Index LA diam:      3.00 cm 1.38 cm/m  RA Area:     13.20 cm LA Vol (A2C): 34.7 ml 15.97 ml/m RA Volume:   29.40 ml  13.53 ml/m LA Vol (A4C): 33.1 ml 15.23 ml/m  AORTIC VALVE LVOT Vmax:   133.00 cm/s LVOT Vmean:  88.100 cm/s LVOT VTI:    0.195 m  AORTA Ao Root diam: 3.30 cm Ao Asc diam:  3.20 cm MV E velocity: 82.00 cm/s MV A velocity: 110.00 cm/s  SHUNTS MV E/A ratio:  0.75         Systemic VTI:  0.20 m                             Systemic Diam: 1.90 cm Dietrich Pates MD Electronically signed by Dietrich Pates MD Signature Date/Time: 10/28/2020/5:29:43 PM    Final    VAS [place] LOWER EXTREMITY VENOUS (DVT)  Result Date: 11/05/2020  Lower Venous DVT Study Patient Name:  Paul Chambers  Date of Exam:   11/05/2020 Medical Rec #: [id number]     Accession #:    [id number] Date of Birth: Apr 10, 1965    Patient Gender: M Patient Age:   35 years Exam Location:  [facility] Procedure:      VAS [place] LOWER EXTREMITY VENOUS (DVT) Referring Phys: MICHAEL MACZIS --------------------------------------------------------------------------------  Indications: Edema.  Comparison Study: no prior Performing Technologist: [place] Ponder RVS  Examination Guidelines: A complete evaluation includes B-mode imaging, spectral Doppler, color Doppler, and power Doppler as needed of all accessible portions of each vessel. Bilateral testing is considered an integral part of a complete examination. Limited examinations for reoccurring indications may be performed as noted. The reflux portion of the exam is performed with the patient in reverse Trendelenburg.  +---------+---------------+---------+-----------+----------+--------------+ RIGHT    CompressibilityPhasicitySpontaneityPropertiesThrombus Aging +---------+---------------+---------+-----------+----------+--------------+ CFV       Full           Yes      Yes                                 +---------+---------------+---------+-----------+----------+--------------+ SFJ      Full                                                        +---------+---------------+---------+-----------+----------+--------------+ FV Prox  Full                                                        +---------+---------------+---------+-----------+----------+--------------+ FV Mid   Full                                                        +---------+---------------+---------+-----------+----------+--------------+  FV DistalFull                                                        +---------+---------------+---------+-----------+----------+--------------+ PFV      Full                                                        +---------+---------------+---------+-----------+----------+--------------+ POP      Full           Yes      Yes                                 +---------+---------------+---------+-----------+----------+--------------+ PTV      Full                                                        +---------+---------------+---------+-----------+----------+--------------+ PERO     Full                                                        +---------+---------------+---------+-----------+----------+--------------+   +---------+---------------+---------+-----------+----------+--------------+ LEFT     CompressibilityPhasicitySpontaneityPropertiesThrombus Aging +---------+---------------+---------+-----------+----------+--------------+ CFV      Full           Yes      Yes                                 +---------+---------------+---------+-----------+----------+--------------+ SFJ      Full                                                        +---------+---------------+---------+-----------+----------+--------------+ FV Prox  Full                                                         +---------+---------------+---------+-----------+----------+--------------+ FV Mid   Full                                                        +---------+---------------+---------+-----------+----------+--------------+ FV DistalFull                                                        +---------+---------------+---------+-----------+----------+--------------+   PFV      Full                                                        +---------+---------------+---------+-----------+----------+--------------+ POP      Full           Yes      Yes                                 +---------+---------------+---------+-----------+----------+--------------+ PTV      Full                                                        +---------+---------------+---------+-----------+----------+--------------+ PERO     Full                                                        +---------+---------------+---------+-----------+----------+--------------+     Summary: BILATERAL: - No evidence of deep vein thrombosis seen in the lower extremities, bilaterally. -No evidence of popliteal cyst, bilaterally.   *See table(s) above for measurements and observations. Electronically signed by Lemar Livings MD on 11/05/2020 at 3:03:21 PM.    Final    [place] EKG SITE RITE  Result Date: 10/25/2020 If Site Rite image not attached, placement could not be confirmed due to current cardiac rhythm.   Labs:  CBC: Recent Labs    11/02/20 0925 11/04/20 0847 11/05/20 0345 11/06/20 0620  WBC 9.4 8.2 8.6 10.7*  HGB 8.6* 7.8* 8.0* 8.5*  HCT 27.3* 25.1* 25.0* 27.1*  PLT 159 191 215 235    COAGS: Recent Labs    11-01-20 2102 10/22/20 0357 10/22/20 0826  INR 2.1* 2.1* 2.0*    BMP: Recent Labs    11/02/20 0925 11/04/20 0847 11/06/20 0617 11/07/20 0440  NA 136 137 139 136  K 4.0 4.0 3.8 3.9  CL 101 104 105 100  CO2 GLUCOSE 230* 236* 215* 155*  BUN CALCIUM 8.6* 8.2* 7.9* 8.1*  CREATININE 0.61 0.58* 0.51* 0.65  GFRNONAA >60 >60 >60 >60    LIVER FUNCTION TESTS: Recent Labs    11/01/20 0358 11/04/20 0847 11/06/20 0617 11/06/20 0618 11/07/20 0440  BILITOT 0.7 0.5  --  0.6 0.7  AST 33 23  --  26 24  ALT 24 17  --  16 15  ALKPHOS 89 76  --  89 82  PROT 5.7* 5.6*  --  5.9* 5.9*  ALBUMIN 1.5* 1.4* 1.3* 1.3* 1.9*    TUMOR MARKERS: No results for input(s): AFPTM, CEA, CA199, CHROMGRNA in the last 8760 hours.  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  CT showed = Mildly increased amount of fluid is noted around the liver with an increased amount of air seen in the non dependent portion concerning for persistent bowel leak.  Images reviewed by  Dr. Grace Isaac.   Will proceed with placement of possibly 2 drains tomorrow.  Risks and benefits discussed with the patient including bleeding, infection, damage to adjacent structures, bowel perforation/fistula connection, and sepsis.  All of the patient's questions were answered, patient is agreeable to proceed. Consent signed and in chart.  Thank you for allowing our service to participate in Paul Chambers 's care.  Electronically Signed: Gwynneth Macleod, PA-C   11/07/2020, 12:49 PM      I spent a total of 40 Minutes  in face to face in clinical consultation, greater than 50% of which was counseling/coordinating care for drain placement.

## 2020-11-07 NOTE — Progress Notes (Signed)
PROGRESS NOTE  Paul Chambers BDZ:[id number] DOB: August 25, 1965   PCP: Pcp, No  Patient is from: Home  DOA: 10/22/2020 LOS: 17  Chief complaints:  Chief Complaint  Patient presents with   Altered Mental Status     Brief Narrative / Interim history: 55 year old M with PMH of DM-2, EtOH abuse, pancreatitis and tobacco use presenting with altered mental status and abdominal pain.  Per wife, started presentation to healthcare facilities ([facility], [facility] and finally [facility]) with abdominal pain.  In ED, CTH negative.  CT abdomen and pelvis raise concern for pneumoperitoneum without an obvious location of perforation.  He was started on vancomycin and Zosyn.  General surgery consulted.  He underwent ex lap on 9/9 and found to have large necrotic fluid surrounding pancreas concerning for necrotic pancreatitis but no bowel perforation.  He had 2 drains placed.  No fluid culture sent.  He was transferred to ICU postop.  He was extubated on 9/12 to Venturi mask.  Came off sedation on 9/13 and transferred to [facility] service on 9/15.  Hospital course complicated by ARF in the setting of possible aspiration pneumonia, exudative left pleural effusion, acute blood loss anemia requiring transfusion, acute metabolic encephalopathy/delirium and severe malnutrition from prolonged n.p.o. requiring TPN initiation.  Patient is on IV Zosyn and vancomycin 9/8-9/15, IV meropenem 9/15-9/19, and Levaquin and Flagyl 9/19 to date.   Lately, general surgery weaning of TPN to TF with postpyloric core track.  Continues to have intermittent fever and tachycardia despite antibiotics.  ID consulted and changed antibiotics to IV Zosyn on 9/24.  CT abdomen and pelvis showed perihepatic fluid (new).  IR to drain placed on 9/26.  Subjective: Patient had an episode of confusion yesterday and last night.  Also spiked fever to 102.2 last night.  Has no complaint this morning.  He denies chest pain, shortness of breath, abdominal  pain or UTI symptoms.  He is fully oriented except to date.  Fair insight into his situation.  No family at bedside  Objective: Vitals:   11/07/20 0130 11/07/20 0249 11/07/20 0730 11/07/20 1113  BP:  125/76 132/76 121/80  Pulse:  (!) 106 (!) 108 (!) 104  Resp:  18 18 18   Temp: 98.4 F (36.9 C) 98.5 F (36.9 C) 98.6 F (37 C) 98.9 F (37.2 C)  TempSrc: Oral Oral Oral Oral  SpO2:  94% 92% 93%  Weight:      Height:        Intake/Output Summary (Last 24 hours) at 11/07/2020 1444 Last data filed at 11/07/2020 0616 Gross per 24 hour  Intake 2623.04 ml  Output 505 ml  Net 2118.04 ml   Filed Weights   11/02/20 0444 11/03/20 0346 11/04/20 0603  Weight: 98 kg 98.1 kg 100.2 kg    Examination:  GENERAL: No apparent distress.  Nontoxic. HEENT: MMM.  Vision and hearing grossly intact.  NECK: Supple.  No apparent JVD.  RESP: 92% on 4 L.  No IWOB.  Diminished aeration. CVS: HR in 100s.  Heart sounds normal.  ABD/GI/GU: BS+. Abd soft.  No significant tenderness.  Dressing over laparotomy wound DCI.  JP drains with small purulent material MSK/EXT:  Moves extremities. No apparent deformity. No edema.  SKIN: no apparent skin lesion or wound NEURO: Awake and alert. Oriented x4 except date.  Follows commands.  No apparent focal neuro deficit. PSYCH: Calm. Normal affect.   Procedures:  9/9-ex lap with drain placement by Dr. Dr. 11/06/20 9/9-9/13-intubation and mechanical ventilation  Microbiology summarized: 9/8-COVID-19 and  influenza PCR nonreactive. 9/9-MRSA PCR screen negative. 9/8 and 9/9-blood cultures NGTD 9/15-sputum culture with STENOTROPHOMONAS MALTOPHILIA sensitive to Levaquin and Bactrim 9/21-blood cultures NGTD.  Assessment & Plan: Sepsis due to infected necrotizing pancreatitis with pneumoperitoneum and possible aspiration pneumonia -Manage individual problems as below.    Infected necrotizing pancreatitis with pneumoperitoneum, intra-abdominal and retroperitoneal  fluid: -S/p ex-lap and drain placement by Dr. Drexel Iha on 9/9.  Fluid culture was not sent -Repeat CT a/p  on 9/17 with large amount of residual gas and fluid dissecting through the retroperitoneum -Repeat CT a/p over 9/24 with increased perihepatic fluid collection.  IR to place drain on 9/26 -9/8 Vanco+Zosyn>9/9>Zyvox+Zosyn>9/14 >meropenem>9/19>Levaquin+Flagyl>9/24 IV Zosyn per ID>> -Ex lap wound care per general surgery. -NPO.  TF on hold.  Start dextrose 10% infusion at 50 cc an hour -IS/OOB/PT/OT   Acute respiratory failure with hypoxia/possible aspiration pneumonia/atelectasis: Resolved 6 over with hypoinflation and retrocardiac opacity.  Still spiking fever but fever curve downtrending.  No leukocytosis. -ETT postop 9/9-10/2011.  -Antibiotic course as above.  -Aggressive pulmonary toileting, IS, OOB, PT/OT.  Has very poor inspiratory effort on IS.  Anasarca/edema-likely due to malnutrition and hypoalbuminemia.  BNP 160.  TTE on 9/15 basically normal. -Diuresis with IV Lasix.  Received albumin 2 mobilize fluid intravascularly -Hold IV Lasix tonight and tomorrow morning -Closely monitor fluid status, renal functions and electrolytes  Acute toxic-metabolic encephalopathy/delirium: Encephalopathy likely from severe sepsis and ICU delirium.  He is awake and oriented x4.  Fair insight as well but intermittently confused -Treat treatable causes  -Reorientation and delirium precautions.   -Continue IV Haldol as needed -Fall and aspiration precautions    Ileus: Seems to have resolved.  Diarrhea: Likely from TF.  Low suspicion for C. difficile.  However, he is at risk for C. difficile due to prolonged IV antibiotics   Acute hepatitis: Likely from TPN.  Expect improvement now that he is coming off TPN. -Monitor   Alcohol abuse: Out of the window for withdrawal complications.   -Continue thiamine and folate   Acute Blood Loss Anemia, postsurgical: S/p 1 unit of pRBCs on 9/10 with  stabilization in hemoglobin since.  Recent Labs    10/26/20 1811 10/27/20 0500 10/28/20 0212 10/29/20 1008 10/30/20 0328 10/31/20 0352 11/02/20 0925 11/04/20 0847 11/05/20 0345 11/06/20 0620  HGB 9.2* 8.2* 8.6* 8.6* 8.3* 8.4* 8.6* 7.8* 8.0* 8.5*  -Continue monitoring  Controlled DM-2 with hyperglycemia: A1c 6.8% on admission. Recent Labs  Lab 11/06/20 2026 11/07/20 0058 11/07/20 0506 11/07/20 0729 11/07/20 1112  GLUCAP 144* 162* 163* 166* 90  -Continue SSI-high every 4 hours -Hold basal and tube feed coverage -Start dextrose 10% at 50 cc an hour -Further adjustment as appropriate   Hypokalemia/hypomagnesemia/hypophosphatemia: Resolved.   Dysphagia: Cleared by SLP -Currently n.p.o. pending procedure  Debility/physical deconditioning  Severe protein calorie malnutrition in the setting of n.p.o. as evidenced by anasarca, hypoalbuminemia Body mass index is 32.62 kg/m. Nutrition Problem: Severe Malnutrition Etiology: acute illness (infected necrotic pancreatitis) Signs/Symptoms: moderate fat depletion, moderate muscle depletion, energy intake < or equal to 50% for > or equal to 5 days, percent weight loss (8.4% weight loss in 1.5 months) Percent weight loss: 8.4 % (1.5 months) Interventions: TPN, Tube feeding, Refer to RD note for recommendations   DVT prophylaxis:  heparin injection 5,000 Units Start: 10/22/20 1400 Place and maintain sequential compression device Start: 10/22/2020 2202 SCDs Start: 10/31/2020 2145  Code Status: Full code Family Communication: Updated patient's wife at bedside on 9/24.  None at bedside  today Level of care: Progressive Status is: Inpatient  Remains inpatient appropriate because:Hemodynamically unstable, Ongoing active pain requiring inpatient pain management, Ongoing diagnostic testing needed not appropriate for outpatient work up, IV treatments appropriate due to intensity of illness or inability to take PO, and Inpatient level of care  appropriate due to severity of illness  Dispo: The patient is from: Home              Anticipated d/c is to: CIR              Patient currently is not medically stable to d/c.   Difficult to place patient No       Consultants:  General surgery Infectious disease Interventional radiology   Sch Meds:  Scheduled Meds:  chlorhexidine  15 mL Mouth Rinse BID   Chlorhexidine Gluconate Cloth  6 each Topical Q0600   feeding supplement (PROSource TF)  45 mL Per Tube TID   fluticasone furoate-vilanterol  1 puff Inhalation Daily   And   umeclidinium bromide  1 puff Inhalation Daily   folic acid  1 mg Per Tube Daily   heparin  5,000 Units Subcutaneous Q8H   insulin aspart  0-20 Units Subcutaneous Q4H   mouth rinse  15 mL Mouth Rinse q12n4p   pantoprazole (PROTONIX) IV  40 mg Intravenous Q24H   sodium chloride flush  10-40 mL Intracatheter Q12H   sodium chloride flush  10-40 mL Intracatheter Q12H   thiamine  100 mg Per Tube Daily   Continuous Infusions:  sodium chloride Stopped (10/31/20 0131)   dextrose     feeding supplement (VITAL 1.5 CAL) 1,000 mL (11/06/20 1734)   piperacillin-tazobactam (ZOSYN)  IV 3.375 g (11/07/20 1328)   PRN Meds:.sodium chloride, acetaminophen (TYLENOL) oral liquid 160 mg/5 mL, acetaminophen, albuterol, docusate, haloperidol lactate, HYDROmorphone (DILAUDID) injection, ondansetron (ZOFRAN) IV, polyethylene glycol, sodium chloride flush, sodium chloride flush  Antimicrobials: Anti-infectives (From admission, onward)    Start     Dose/Rate Route Frequency Ordered Stop   11/06/20 1545  piperacillin-tazobactam (ZOSYN) IVPB 3.375 g        3.375 g 12.5 mL/hr over 240 Minutes Intravenous Every 8 hours 11/06/20 1453     11/01/20 0900  levofloxacin (LEVAQUIN) IVPB 750 mg  Status:  Discontinued        750 mg 100 mL/hr over 90 Minutes Intravenous Every 24 hours 11/01/20 0824 11/06/20 1408   11/01/20 0900  metroNIDAZOLE (FLAGYL) IVPB 500 mg  Status:  Discontinued         500 mg 100 mL/hr over 60 Minutes Intravenous Every 12 hours 11/01/20 0824 11/06/20 1408   10/29/20 2200  meropenem (MERREM) 1 g in sodium chloride 0.9 % 100 mL IVPB  Status:  Discontinued        1 g 200 mL/hr over 30 Minutes Intravenous Every 8 hours 10/29/20 1428 11/01/20 0824   10/28/20 1615  meropenem (MERREM) 2 g in sodium chloride 0.9 % 100 mL IVPB  Status:  Discontinued        2 g 200 mL/hr over 30 Minutes Intravenous Every 8 hours 10/28/20 1515 10/29/20 1428   10/28/20 1445  metroNIDAZOLE (FLAGYL) IVPB 500 mg  Status:  Discontinued        500 mg 100 mL/hr over 60 Minutes Intravenous Every 12 hours 10/28/20 1348 10/28/20 1505   10/25/20 2300  fluconazole (DIFLUCAN) IVPB 400 mg        400 mg 100 mL/hr over 120 Minutes Intravenous Every 24 hours 10/25/20 0742  10/29/20 0718   10/24/20 2300  fluconazole (DIFLUCAN) IVPB 200 mg  Status:  Discontinued        200 mg 100 mL/hr over 60 Minutes Intravenous Every 24 hours 10/24/20 0943 10/25/20 0742   10/22/20 1800  vancomycin (VANCOREADY) IVPB 1500 mg/300 mL  Status:  Discontinued        1,500 mg 150 mL/hr over 120 Minutes Intravenous Every 24 hours 11/10/2020 2225 10/22/20 0759   10/22/20 0900  linezolid (ZYVOX) IVPB 600 mg  Status:  Discontinued        600 mg 300 mL/hr over 60 Minutes Intravenous Every 12 hours 10/22/20 0800 10/27/20 0852   10/22/20 0300  piperacillin-tazobactam (ZOSYN) IVPB 3.375 g  Status:  Discontinued        3.375 g 12.5 mL/hr over 240 Minutes Intravenous Every 8 hours 11/11/2020 2225 10/28/20 1514   10/22/2020 2335  metroNIDAZOLE (FLAGYL) IVPB 500 mg  Status:  Discontinued        500 mg 100 mL/hr over 60 Minutes Intravenous Every 12 hours 10/28/2020 2142 10/22/20 0747   10/27/2020 2248  fluconazole (DIFLUCAN) IVPB 400 mg  Status:  Discontinued        400 mg 100 mL/hr over 120 Minutes Intravenous Every 24 hours 11/10/2020 2142 10/24/20 0943   10/25/2020 1748  vancomycin (VANCOREADY) IVPB 2000 mg/400 mL        2,000  mg 200 mL/hr over 120 Minutes Intravenous  Once 10/14/2020 1706 10/20/2020 2113   10/15/2020 1715  piperacillin-tazobactam (ZOSYN) IVPB 3.375 g        3.375 g 100 mL/hr over 30 Minutes Intravenous  Once 11/08/2020 1706 11/11/2020 1903        I have personally reviewed the following labs and images: CBC: Recent Labs  Lab 11/02/20 0925 11/04/20 0847 11/05/20 0345 11/06/20 0620  WBC 9.4 8.2 8.6 10.7*  NEUTROABS 7.8* 6.4  --  8.0*  HGB 8.6* 7.8* 8.0* 8.5*  HCT 27.3* 25.1* 25.0* 27.1*  MCV 105.8* 104.6* 104.2* 105.4*  PLT 159 191 215 235   BMP &GFR Recent Labs  Lab 11/01/20 0358 11/02/20 0925 11/04/20 0847 11/06/20 0617 11/07/20 0440  NA 142 136 137 139 136  K 4.2 4.0 4.0 3.8 3.9  CL 99 101 104 105 100  CO2 GLUCOSE 212* 230* 236* 215* 155*  BUN CREATININE 0.59* 0.61 0.58* 0.51* 0.65  CALCIUM 8.7* 8.6* 8.2* 7.9* 8.1*  MG 2.0  --  1.9  --  1.8  PHOS 2.9  --  3.1 2.9 3.2   Estimated Creatinine Clearance: 126 mL/min (by C-G formula based on SCr of 0.65 mg/dL). Liver & Pancreas: Recent Labs  Lab 11/01/20 0358 11/04/20 0847 11/06/20 0617 11/06/20 0618 11/07/20 0440  AST 33 23  --  26 24  ALT 24 17  --  16 15  ALKPHOS 89 76  --  89 82  BILITOT 0.7 0.5  --  0.6 0.7  PROT 5.7* 5.6*  --  5.9* 5.9*  ALBUMIN 1.5* 1.4* 1.3* 1.3* 1.9*   No results for input(s): LIPASE, AMYLASE in the last 168 hours. Recent Labs  Lab 11/07/20 0440  AMMONIA 38*   Diabetic: No results for input(s): HGBA1C in the last 72 hours. Recent Labs  Lab 11/06/20 2026 11/07/20 0058 11/07/20 0506 11/07/20 0729 11/07/20 1112  GLUCAP 144* 162* 163* 166* 90   Cardiac Enzymes: Recent Labs  Lab 11/07/20 0440  CKTOTAL 10*  No results for input(s): PROBNP in the last 8760 hours. Coagulation Profile: No results for input(s): INR, PROTIME in the last 168 hours. Thyroid Function Tests: Recent Labs    11/07/20 0440  TSH 8.574*   Lipid Profile: No results for  input(s): CHOL, HDL, LDLCALC, TRIG, CHOLHDL, LDLDIRECT in the last 72 hours. Anemia Panel: No results for input(s): VITAMINB12, FOLATE, FERRITIN, TIBC, IRON, RETICCTPCT in the last 72 hours. Urine analysis:    Component Value Date/Time   COLORURINE AMBER (A) 10/23/20 1549   APPEARANCEUR CLEAR 10/23/2020 1549   LABSPEC 1.015 Oct 23, 2020 1549   PHURINE 6.0 10/23/2020 1549   GLUCOSEU NEGATIVE 2020/10/23 1549   HGBUR TRACE (A) 10/23/20 1549   BILIRUBINUR SMALL (A) 2020-10-23 1549   KETONESUR 15 (A) 10/23/20 1549   PROTEINUR NEGATIVE 2020/10/23 1549   NITRITE NEGATIVE 2020/10/23 1549   LEUKOCYTESUR NEGATIVE 10/23/2020 1549   Sepsis Labs: Invalid input(s): PROCALCITONIN, LACTICIDVEN  Microbiology: Recent Results (from the past 240 hour(s))  Expectorated Sputum Assessment w Gram Stain, Rflx to Resp Cult     Status: None   Collection Time: 10/28/20  2:57 PM   Specimen: Expectorated Sputum  Result Value Ref Range Status   Specimen Description EXPECTORATED SPUTUM  Final   Special Requests NONE  Final   Sputum evaluation   Final    THIS SPECIMEN IS ACCEPTABLE FOR SPUTUM CULTURE Performed at [facility], [address] [place], [place] [address]    Report Status 10/30/2020 FINAL  Final  Culture, Respiratory w Gram Stain     Status: None   Collection Time: 10/28/20  2:57 PM  Result Value Ref Range Status   Specimen Description EXPECTORATED SPUTUM  Final   Special Requests NONE Reflexed from [id number]  Final   Gram Stain   Final    FEW SQUAMOUS EPITHELIAL CELLS PRESENT MODERATE WBC PRESENT,BOTH PMN AND MONONUCLEAR ABUNDANT GRAM NEGATIVE RODS Performed at [facility], [address] [place], [place] [address]    Culture MODERATE STENOTROPHOMONAS MALTOPHILIA  Final   Report Status 11/01/2020 FINAL  Final   Organism ID, Bacteria STENOTROPHOMONAS MALTOPHILIA  Final      Susceptibility   Stenotrophomonas maltophilia - MIC*    LEVOFLOXACIN 0.5 SENSITIVE Sensitive      TRIMETH/SULFA <=20 SENSITIVE Sensitive     * MODERATE STENOTROPHOMONAS MALTOPHILIA  Culture, blood (Routine X 2) w Reflex to ID Panel     Status: None (Preliminary result)   Collection Time: 11/03/20 11:23 AM   Specimen: BLOOD  Result Value Ref Range Status   Specimen Description BLOOD LEFT ANTECUBITAL  Final   Special Requests   Final    BOTTLES DRAWN AEROBIC ONLY Blood Culture adequate volume   Culture   Final    NO GROWTH 4 DAYS Performed at [facility], [address] [place], [place] [address]    Report Status PENDING  Incomplete  Culture, blood (Routine X 2) w Reflex to ID Panel     Status: None (Preliminary result)   Collection Time: 11/03/20 11:24 AM   Specimen: BLOOD LEFT HAND  Result Value Ref Range Status   Specimen Description BLOOD LEFT HAND  Final   Special Requests   Final    BOTTLES DRAWN AEROBIC ONLY Blood Culture adequate volume   Culture   Final    NO GROWTH 4 DAYS Performed at [facility], [address] [place], [place] [address]    Report Status PENDING  Incomplete  Radiology Studies: CT ABDOMEN PELVIS W CONTRAST  Result Date: 11/06/2020 CLINICAL DATA:  Abdominal abscess or infection. EXAM: CT ABDOMEN AND PELVIS WITH CONTRAST TECHNIQUE: Multidetector CT imaging of the abdomen and pelvis was performed using the standard protocol following bolus administration of intravenous contrast. CONTRAST:  66mL OMNIPAQUE IOHEXOL 300 MG/ML  SOLN COMPARISON:  October 29, 2020. FINDINGS: Lower chest: Bilateral pleural effusions are noted with adjacent atelectasis or infiltrates of both lower lobes. Hepatobiliary: No focal liver abnormality is seen. No gallstones, gallbladder wall thickening, or biliary dilatation. Pancreas: Unremarkable. No pancreatic ductal dilatation or surrounding inflammatory changes. Spleen: Normal in size without focal abnormality. Adrenals/Urinary Tract: Adrenal glands are unremarkable. Kidneys are normal, without renal calculi, focal  lesion, or hydronephrosis. Bladder is unremarkable. Stomach/Bowel: Feeding tube is seen passing through the stomach in the duodenum with distal tip in expected position of proximal jejunum. Stomach is otherwise unremarkable. No abnormal bowel dilatation or inflammation is noted. The appendix appears normal. Vascular/Lymphatic: No significant vascular findings are present. No enlarged abdominal or pelvic lymph nodes. Reproductive: Prostate is unremarkable. Other: Stable position of 2 surgical drains entering right lower quadrant, with 1 tip seen inferior to the liver and the other tip extending into the left upper quadrant inferior to proximal stomach. Grossly stable gas and fluid collections are seen in the retroperitoneum which are drain by these tubes. There is an increased amount of fluid noted around the liver with an increased amount of gas in its non dependent portion suggesting persistent bowel leak. Remains a large amount of fluid and gas in a collection in the left pericolic gutter. Musculoskeletal: No acute or significant osseous findings. IMPRESSION: Bilateral pleural effusions are again noted with adjacent atelectasis or infiltrates of both lower lobes. Stable position of 2 surgical drains entering right lower quadrant, with tip seen inferior to the liver and inferior to the proximal stomach in the left upper quadrant. Grossly stable gas and fluid collections are seen throughout the retroperitoneum which are drain by these tubes. Stable gas and fluid collection is noted in left pericolic gutter. Mildly increased amount of fluid is noted around the liver with an increased amount of air seen in the non dependent portion concerning for persistent bowel leak. Distal tip of feeding tube appears to be in the proximal jejunum. Electronically Signed   By: Lupita Raider M.D.   On: 11/06/2020 18:58     Paul Chambers T. Travon Crochet [facility]  If 7PM-7AM, please contact night-coverage [email or web address] 11/07/2020,  2:44 PM

## 2020-11-08 ENCOUNTER — Inpatient Hospital Stay (HOSPITAL_COMMUNITY): Payer: BC Managed Care – PPO

## 2020-11-08 DIAGNOSIS — D539 Nutritional anemia, unspecified: Secondary | ICD-10-CM | POA: Diagnosis not present

## 2020-11-08 DIAGNOSIS — A419 Sepsis, unspecified organism: Secondary | ICD-10-CM | POA: Diagnosis not present

## 2020-11-08 DIAGNOSIS — E8809 Other disorders of plasma-protein metabolism, not elsewhere classified: Secondary | ICD-10-CM | POA: Diagnosis not present

## 2020-11-08 DIAGNOSIS — K8591 Acute pancreatitis with uninfected necrosis, unspecified: Secondary | ICD-10-CM | POA: Diagnosis not present

## 2020-11-08 LAB — COMPREHENSIVE METABOLIC PANEL
ALT: 14 U/L (ref 0–44)
AST: 20 U/L (ref 15–41)
Albumin: 1.7 g/dL — ABNORMAL LOW (ref 3.5–5.0)
Alkaline Phosphatase: 76 U/L (ref 38–126)
Anion gap: 8 (ref 5–15)
BUN: 16 mg/dL (ref 6–20)
CO2: 30 mmol/L (ref 22–32)
Calcium: 7.9 mg/dL — ABNORMAL LOW (ref 8.9–10.3)
Chloride: 97 mmol/L — ABNORMAL LOW (ref 98–111)
Creatinine, Ser: 0.7 mg/dL (ref 0.61–1.24)
GFR, Estimated: >60 mL/min (ref >60)
Glucose, Bld: 172 mg/dL — ABNORMAL HIGH (ref 70–99)
Potassium: 4 mmol/L (ref 3.5–5.1)
Sodium: 135 mmol/L (ref 135–145)
Total Bilirubin: 0.8 mg/dL (ref 0.3–1.2)
Total Protein: 6.3 g/dL — ABNORMAL LOW (ref 6.5–8.1)

## 2020-11-08 LAB — CBC
HCT: 25.9 % — ABNORMAL LOW (ref 39.0–52.0)
Hemoglobin: 8.3 g/dL — ABNORMAL LOW (ref 13.0–17.0)
MCH: 32.2 pg (ref 26.0–34.0)
MCHC: 32 g/dL (ref 30.0–36.0)
MCV: 100.4 fL — ABNORMAL HIGH (ref 80.0–100.0)
Platelets: 326 10*3/uL (ref 150–400)
RBC: 2.58 MIL/uL — ABNORMAL LOW (ref 4.22–5.81)
RDW: 18.6 % — ABNORMAL HIGH (ref 11.5–15.5)
WBC: 13 10*3/uL — ABNORMAL HIGH (ref 4.0–10.5)
nRBC: 1.1 % — ABNORMAL HIGH (ref 0.0–0.2)

## 2020-11-08 LAB — MAGNESIUM: Magnesium: 2 mg/dL (ref 1.7–2.4)

## 2020-11-08 LAB — TRIGLYCERIDES: Triglycerides: 101 mg/dL (ref <150)

## 2020-11-08 LAB — GLUCOSE, CAPILLARY
Glucose-Capillary: 135 mg/dL — ABNORMAL HIGH (ref 70–99)
Glucose-Capillary: 157 mg/dL — ABNORMAL HIGH (ref 70–99)
Glucose-Capillary: 158 mg/dL — ABNORMAL HIGH (ref 70–99)
Glucose-Capillary: 188 mg/dL — ABNORMAL HIGH (ref 70–99)
Glucose-Capillary: 192 mg/dL — ABNORMAL HIGH (ref 70–99)
Glucose-Capillary: 222 mg/dL — ABNORMAL HIGH (ref 70–99)

## 2020-11-08 LAB — CULTURE, BLOOD (ROUTINE X 2)
Culture: NO GROWTH
Culture: NO GROWTH
Special Requests: ADEQUATE
Special Requests: ADEQUATE

## 2020-11-08 LAB — PHOSPHORUS: Phosphorus: 4 mg/dL (ref 2.5–4.6)

## 2020-11-08 MED ORDER — MIDAZOLAM HCL 2 MG/2ML IJ SOLN
INTRAMUSCULAR | Status: DC | PRN
Start: 2020-11-08 — End: 2020-11-12
  Administered 2020-11-08 (×2): .5 mg via INTRAVENOUS

## 2020-11-08 MED ORDER — FENTANYL CITRATE (PF) 100 MCG/2ML IJ SOLN
INTRAMUSCULAR | Status: DC | PRN
  Administered 2020-11-08: 25 ug via INTRAVENOUS

## 2020-11-08 MED ORDER — MIDAZOLAM HCL 2 MG/2ML IJ SOLN
INTRAMUSCULAR | Status: AC
  Filled 2020-11-08: qty 2

## 2020-11-08 MED ORDER — FENTANYL CITRATE (PF) 100 MCG/2ML IJ SOLN
INTRAMUSCULAR | Status: AC
  Filled 2020-11-08: qty 2

## 2020-11-08 NOTE — Procedures (Signed)
Pre procedural Dx: Necrotizing Pancreatitis Post procedural Dx: Same  Technically successful CT guided placed of a 14 Fr drainage catheter placement into the perihepatic abscess yielding 100 cc of blood tinged fluid. Technically successful CT guided placed of a 14 Fr drainage catheter placement into right lower abdominal abscess yielding 50 cc of purulent appearing fluid. Technically successful CT guided placed of a 14 Fr drainage catheter placement into left lower abdominal abscess yielding 125 cc of purulent appearing fluid.  Representative aspirated sample from this collection sent to lab for analysis.  All drains connected to gravity bags.  Technically successful CT guided aspiration of 5 cc of bloody fluid from indeterminate fluid collection within the left pelvis.  This collection is current not amendable to drain placement.    EBL: Trace Complications: None immediate  Katherina Right, MD Pager #: [phone]

## 2020-11-08 NOTE — Progress Notes (Signed)
Inpatient Rehab Admissions Coordinator:    Pt . Is not medically ready for CIR at this time. I will follow for potential admission pending medical readiness, insurance auth, and bed availability.  Megan Salon, MS, CCC-SLP Rehab Admissions Coordinator  [phone] (celll) [phone] (office)

## 2020-11-08 NOTE — Progress Notes (Signed)
[facility]    Date of Admission:  2020-10-29   Total days of antibiotics 19/day 2 piptazo           ID: Paul Chambers is a 55 y.o. male with pneumoperitoneum without an obvious location of perforation on  Zosyn s/p ex lap on 9/9 found to have necrotic panc with peripancreatic fluid collection s/p 2 drain placement. Hospital course complicated with aspiration PNA, left pleural effusion, severe malnutrition now on TF. Last febrile 36hr ago.  CT abdomen and pelvis showed perihepatic fluid (new). Awaiting new drain placement Principal Problem:   Severe sepsis (HCC) Active Problems:   Acute necrotizing pancreatitis   Toxic encephalopathy   Hyponatremia   Hypoalbuminemia   Macrocytic anemia   Pressure injury of skin   Protein-calorie malnutrition, severe   Intra-abdominal abscess (HCC)    Subjective: Afebrile, still having some pain to right side of abdomen, and discomfort near drain side. Still having output from current drains  Medications:   chlorhexidine  15 mL Mouth Rinse BID   Chlorhexidine Gluconate Cloth  6 each Topical Q0600   feeding supplement (PROSource TF)  45 mL Per Tube TID   fentaNYL       fluticasone furoate-vilanterol  1 puff Inhalation Daily   And   umeclidinium bromide  1 puff Inhalation Daily   folic acid  1 mg Per Tube Daily   heparin  5,000 Units Subcutaneous Q8H   insulin aspart  0-20 Units Subcutaneous Q4H   mouth rinse  15 mL Mouth Rinse q12n4p   midazolam       pantoprazole (PROTONIX) IV  40 mg Intravenous Q24H   sodium chloride flush  10-40 mL Intracatheter Q12H   sodium chloride flush  10-40 mL Intracatheter Q12H   thiamine  100 mg Per Tube Daily    Objective: Vital signs in last 24 hours: Temp:  [98.3 F (36.8 C)-98.9 F (37.2 C)] 98.8 F (37.1 C) (09/26 0844) Pulse Rate:  [95-103] 101 (09/26 1245) Resp:  [14-26] 26 (09/26 1245) BP: (122-137)/(73-83) 128/80 (09/26 1245) SpO2:  [90 %-98 %] 96 % (09/26  1245) Weight:  [96.1 kg] 96.1 kg (09/26 0500) Physical Exam  Constitutional: He is oriented to person, place, and time. He appears well-developed and well-nourished. No distress.  HENT:  Mouth/Throat: Oropharynx is clear and moist. No oropharyngeal exudate.  Cardiovascular: Normal rate, regular rhythm and normal heart sounds. Exam reveals no gallop and no friction rub.  No murmur heard.  Pulmonary/Chest: Effort normal and breath sounds normal. No respiratory distress. He has no wheezes.  Abdominal: Soft. Bowel sounds are normal. He exhibits mild distension. Mild tenderness to right side of abdomen no erythema. Bandage in place Lymphadenopathy:  He has no cervical adenopathy.  Neurological: He is alert and oriented to person, place, and time.  Skin: Skin is warm and dry. No rash noted. No erythema.  Psychiatric: He has a normal mood and affect. His behavior is normal.    Lab Results Recent Labs    11/06/20 0620 11/07/20 0440 11/08/20 0557  WBC 10.7*  --  13.0*  HGB 8.5*  --  8.3*  HCT 27.1*  --  25.9*  NA  --  136 135  K  --  3.9 4.0  CL  --  100 97*  CO2  --  29 30  BUN  --  16 16  CREATININE  --  0.65 0.70   Liver Panel Recent Labs    11/06/20  3825 11/07/20 0440 11/08/20 0557  PROT 5.9* 5.9* 6.3*  ALBUMIN 1.3* 1.9* 1.7*  AST 26 24 20   ALT 16 15 14   ALKPHOS 89 82 76  BILITOT 0.6 0.7 0.8  BILIDIR 0.2  --   --   IBILI 0.4  --   --      Microbiology: reviewed Studies/Results: CT ABDOMEN PELVIS W CONTRAST  Result Date: 11/06/2020 CLINICAL DATA:  Abdominal abscess or infection. EXAM: CT ABDOMEN AND PELVIS WITH CONTRAST TECHNIQUE: Multidetector CT imaging of the abdomen and pelvis was performed using the standard protocol following bolus administration of intravenous contrast. CONTRAST:  81mL OMNIPAQUE IOHEXOL 300 MG/ML  SOLN COMPARISON:  October 29, 2020. FINDINGS: Lower chest: Bilateral pleural effusions are noted with adjacent atelectasis or infiltrates of both  lower lobes. Hepatobiliary: No focal liver abnormality is seen. No gallstones, gallbladder wall thickening, or biliary dilatation. Pancreas: Unremarkable. No pancreatic ductal dilatation or surrounding inflammatory changes. Spleen: Normal in size without focal abnormality. Adrenals/Urinary Tract: Adrenal glands are unremarkable. Kidneys are normal, without renal calculi, focal lesion, or hydronephrosis. Bladder is unremarkable. Stomach/Bowel: Feeding tube is seen passing through the stomach in the duodenum with distal tip in expected position of proximal jejunum. Stomach is otherwise unremarkable. No abnormal bowel dilatation or inflammation is noted. The appendix appears normal. Vascular/Lymphatic: No significant vascular findings are present. No enlarged abdominal or pelvic lymph nodes. Reproductive: Prostate is unremarkable. Other: Stable position of 2 surgical drains entering right lower quadrant, with 1 tip seen inferior to the liver and the other tip extending into the left upper quadrant inferior to proximal stomach. Grossly stable gas and fluid collections are seen in the retroperitoneum which are drain by these tubes. There is an increased amount of fluid noted around the liver with an increased amount of gas in its non dependent portion suggesting persistent bowel leak. Remains a large amount of fluid and gas in a collection in the left pericolic gutter. Musculoskeletal: No acute or significant osseous findings. IMPRESSION: Bilateral pleural effusions are again noted with adjacent atelectasis or infiltrates of both lower lobes. Stable position of 2 surgical drains entering right lower quadrant, with tip seen inferior to the liver and inferior to the proximal stomach in the left upper quadrant. Grossly stable gas and fluid collections are seen throughout the retroperitoneum which are drain by these tubes. Stable gas and fluid collection is noted in left pericolic gutter. Mildly increased amount of fluid is  noted around the liver with an increased amount of air seen in the non dependent portion concerning for persistent bowel leak. Distal tip of feeding tube appears to be in the proximal jejunum. Electronically Signed   By: 91m M.D.   On: 11/06/2020 18:58     Assessment/Plan: Peripancreatic fluid collection with necrotizing pancreatitis and increase intra-abdominal fluid collection = please send fluid for culture from today's procedure of new abdominal drain placement.   Continue on pip/tazo.   Severe-protein malnutrition = defer to surgery if need to challenge by mouth. Currently on TF  [facility] Pager: [phone]  11/08/2020, 12:55 PM

## 2020-11-08 NOTE — Progress Notes (Signed)
PT Cancellation Note  Patient Details Name: Paul Chambers MRN: [id number] DOB: 19-Apr-1965   Cancelled Treatment:    Reason Eval/Treat Not Completed: Patient currently off unit at procedure or test. Will check back as schedule allows to continue with PT POC.    Marylynn Pearson 11/08/2020, 11:41 AM  Conni Slipper, PT, DPT Acute Rehabilitation Services Pager: [phone] Office: [phone]

## 2020-11-08 NOTE — Progress Notes (Signed)
SLP Cancellation Note  Patient Details Name: Paul Chambers MRN: [id number] DOB: Apr 09, 1965   Cancelled treatment:       Reason Eval/Treat Not Completed: Other (comment) - Unable to assess tolerance of PO diet or readiness to advance textures, as pt is currently NPO for procedures. Will continue efforts as schedule permits.  Nashanti Duquette B. Murvin Natal, [facility], CCC-SLP Speech Language Pathologist Office: [phone]  Leigh Aurora 11/08/2020, 9:33 AM

## 2020-11-08 NOTE — Plan of Care (Signed)

## 2020-11-08 NOTE — Progress Notes (Signed)
Nutrition Follow-up  DOCUMENTATION CODES:  Severe malnutrition in context of acute illness/injury  INTERVENTION:  Continue TF via Cortrak: -Vital 1.5 @ 65 ml/hr -33m Prosource TF TID  Provides 2460 kcal, 138 grams of protein, and 1192 ml of H2O.  NUTRITION DIAGNOSIS:  Severe Malnutrition related to acute illness (infected necrotic pancreatitis) as evidenced by moderate fat depletion, moderate muscle depletion, energy intake < or equal to 50% for > or equal to 5 days, percent weight loss (8.4% weight loss in 1.5 months). -- ongoing  GOAL:  Patient will meet greater than or equal to 90% of their needs -- met with TF   MONITOR:  Diet advancement, Labs, Weight trends, TF tolerance, Skin, I & O's, Other (Comment) (TPN)  REASON FOR ASSESSMENT:  Consult Enteral/tube feeding initiation and management  ASSESSMENT:  55year old male who presented to the ED on 9/08 with AMS and abdominal pain. [facility] and was diagnosed with AKI and pancreatitis. PMH of HTN, gout, tobacco abuse, EtOH abuse. Pt admitted with sepsis secondary to infected necrotic pancreatitis.  9/09 - s/p ex-lap with pancreatic debridement for infected necrotic pancreatitis 9/10 - TPN initiated 9/12 - TPN increased to goal rate, extubated 9/14 - NGT clamping trials, NGT removed by pt 9/15 - SLP evaluation with recommendations for NPO except ice chips 9/16 - Cortrak placed for administration of contrast for CT (tip gastric) 9/19 - Cortrak tube advanced post-pyloric, trickle TF initiated 9/20 - emesis, TF held 9/21 - trickle TF restarted 9/23 - TPN at half rate, starting to advance TF to goal 9/24 - TPN d/c, s/p BSE- advanced to full liquid diet 9/25 - pt made NPO 9/26 - s/p CT guided placement of 14 Fr drainage catheters in the following areas today: periphepatic abscess (yielding 1067mblood tinged fluid), R lower abdominal abscess (yielding 5046murulent appearing fluid), and L lower abdominal  abscess (yielding 125m35mrulent appearing fluid). Aspirations sent to lab for analysis. Additionally, pt had CT guided aspiration of 5ml 76mody fluid from indeterminate fluid collection within the L pelvis, but collection was not amenable to drain placement. Abdominal drain culture w/ abundant GPCs in clusters, rare GNR, and rare Enterococcus faecalis  Today, pt endorses abdominal pain, but denies N/V. Surgery has given the OK to resume tube feeding today as there is low suspicion for bowel leak. Per RN, pt currently tolerating TF at previously stated goal of 65ml/25mCurrent TF regimen: Vital 1.5 @ 65ml/h6mth 45ml Pr2mrce TF TID  Medications: Folvite, SSI, Semglee, Protonix, Thiamine, IV abx IVF: D10 @ 40ml/hr 11m: Na 130 (L), Cr 0.52 (L) CBGs: 187-135-146-126  Admission weight: 93.7 kg Current weight: 96.1 kg  UOP:750ml x24 78ms LUQ Drain Output: 20ml x24 h64m R Abd 32 Fr JP Drain Output: 30ml x24 ho60mLateral RUQ Drain Output: 85ml x24 hou37m Abd 28 Fr JP Drain Output: 40ml x24 hour57mteral RLQ Drain Output: 20ml x24 hours23mO: +5.5L since admit  Diet Order:   Diet Order             Diet NPO time specified  Diet effective now                  EDUCATION NEEDS:  No education needs have been identified at this time  Skin:  Skin Assessment: Skin Integrity Issues: Skin Integrity Issues:: Stage I, Incisions Stage I: coccyx Incisions: abdomen  Last BM:  9/27  Height:  Ht Readings from Last 1 Encounters:  10/23/2020  5' 9"  (1.753 m)   Weight:  Wt Readings from Last 1 Encounters:  11/08/20 96.1 kg   BMI:  Body mass index is 31.29 kg/m.  Estimated Nutritional Needs:  Kcal:  6701-4103 Protein:  125-145 grams Fluid:  >/= 2.0 L    Larkin Ina, MS, RD, LDN (she/her/hers) RD pager number and weekend/on-call pager number located in [place].

## 2020-11-08 NOTE — Sedation Documentation (Signed)
Attempted to place patient back on 6L Nasal Cannula but patient desated to 88%.  Made RN aware that patient would come to the floor still on a nonrebreather, saturation 95%.  Patient is alert and oriented.  No pain at this time.

## 2020-11-08 NOTE — Progress Notes (Signed)
PROGRESS NOTE  Paul Chambers TDD:[id number] DOB: Apr 08, 1965   PCP: Pcp, No  Patient is from: Home  DOA: 10/26/2020 LOS: 18  Chief complaints:  Chief Complaint  Patient presents with   Altered Mental Status     Brief Narrative / Interim history: 55 year old M with PMH of DM-2, EtOH abuse, pancreatitis and tobacco use presenting with altered mental status and abdominal pain.  Per wife, started presentation to healthcare facilities ([facility], [facility] and finally [facility]) with abdominal pain.  In ED, CTH negative.  CT abdomen and pelvis raise concern for pneumoperitoneum without an obvious location of perforation.  He was started on vancomycin and Zosyn.  General surgery consulted.  He underwent ex lap on 9/9 and found to have large necrotic fluid surrounding pancreas concerning for necrotic pancreatitis but no bowel perforation.  He had 2 drains placed.  No fluid culture sent.  He was transferred to ICU postop.  He was extubated on 9/12 to Venturi mask.  Came off sedation on 9/13 and transferred to [facility] service on 9/15.  Hospital course complicated by ARF in the setting of possible aspiration pneumonia, exudative left pleural effusion, acute blood loss anemia requiring transfusion, acute metabolic encephalopathy/delirium and severe malnutrition from prolonged n.p.o. requiring TPN initiation.  Patient is on IV Zosyn and vancomycin 9/8-9/15, IV meropenem 9/15-9/19, and Levaquin and Flagyl 9/19 to date.   Lately, general surgery weaning of TPN to TF with postpyloric core track.  Continues to have intermittent fever and tachycardia despite antibiotics.  ID consulted and changed antibiotics to IV Zosyn on 9/24.  CT abdomen and pelvis showed perihepatic fluid (new).  IR to drain placed on 9/26.  Subjective: Seen and examined earlier this morning.  No major events overnight of this morning.  Complaining of abdominal pain.  He points at drain site over RLQ.  He denies shortness of breath.   Patient's wife at bedside.  Objective: Vitals:   11/08/20 0844 11/08/20 1120 11/08/20 1230 11/08/20 1235  BP: 125/77 130/83 129/82 124/74  Pulse: 99 98 97 97  Resp: 16 14 20  (!) 25  Temp: 98.8 F (37.1 C)     TempSrc: Oral     SpO2: 90% 90% 98% 97%  Weight:      Height:        Intake/Output Summary (Last 24 hours) at 11/08/2020 1243 Last data filed at 11/08/2020 1056 Gross per 24 hour  Intake 754.39 ml  Output 1035 ml  Net -280.61 ml   Filed Weights   11/03/20 0346 11/04/20 0603 11/08/20 0500  Weight: 98.1 kg 100.2 kg 96.1 kg    Examination:  GENERAL: No apparent distress.  Nontoxic. HEENT: MMM.  Vision and hearing grossly intact.  NECK: Supple.  No apparent JVD.  RESP: 90% on 4 L.  No IWOB.  Diminished aeration bilaterally.  About 500 cc marks on IS CVS:  RRR. Heart sounds normal.  ABD/GI/GU: BS+. Abd soft.  Mild tenderness.  Dressing DCI.  JP drain purulent output MSK/EXT:  Moves extremities. No apparent deformity.  Trace edema bilaterally. SKIN: no apparent skin lesion or wound NEURO: Awake and alert. Oriented appropriately except date.  Follows commands but struggles with executing instruction with incentive spirometry.  No apparent focal neuro deficit. PSYCH: Calm. Normal affect.   Procedures:  9/9-ex lap with drain placement by Dr. Dr. 11/10/20 9/9-9/13-intubation and mechanical ventilation  Microbiology summarized: 9/8-COVID-19 and influenza PCR nonreactive. 9/9-MRSA PCR screen negative. 9/8 and 9/9-blood cultures NGTD 9/15-sputum culture with STENOTROPHOMONAS MALTOPHILIA sensitive to Levaquin  and Bactrim 9/21-blood cultures NGTD.  Assessment & Plan: Sepsis due to infected necrotizing pancreatitis with pneumoperitoneum and possible aspiration pneumonia -Manage individual problems as below.    Infected necrotizing pancreatitis with pneumoperitoneum, intra-abdominal and retroperitoneal fluid: -S/p ex-lap and drain placement by Dr. Drexel Iha on 9/9.  Fluid  culture was not sent -Repeat CT a/p  on 9/17 with large amount of residual gas and fluid dissecting through the retroperitoneum -Repeat CT a/p over 9/24 with increased perihepatic fluid collection.  IR to place drain on 9/26 -9/8 Vanco+Zosyn>9/9>Zyvox+Zosyn>9/14 >meropenem>9/19>Levaquin+Flagyl>9/24 IV Zosyn per ID>> -Surgical wound care per general surgery. -NPO.  TF on hold.  Decrease dextrose 10% infusion to 40 cc an hour -IS/OOB/PT/OT   Acute respiratory failure with hypoxia/possible aspiration pneumonia/atelectasis: CXR with hypoinflation and retrocardiac opacity most likely from atelectasis versus infectious process.  -ETT postop 9/9-10/2011.  -Should be covered with antibiotic as above but doubt pulmonary infection. -Aggressive pulmonary toileting, IS, OOB, PT/OT.  -Diuretics as below.  Anasarca/edema-likely due to malnutrition and hypoalbuminemia.  BNP 160.  TTE on 9/15 basically normal.  Started IV Lasix with IV albumin.  Anasarca seems to be improving. -Continue holding IV Lasix while NPO. -Closely monitor fluid status, renal functions and electrolytes  Acute toxic-metabolic encephalopathy/delirium: Encephalopathy likely from severe sepsis and ICU delirium.  He is awake and oriented x4.  Fair insight as well but intermittently confused -Treat treatable causes  -Reorientation and delirium precautions.   -Continue IV Haldol as needed -Fall and aspiration precautions    Ileus: Seems to have resolved.  Diarrhea: Likely from TF.  Low suspicion for C. difficile.  However, he is at risk for C. difficile due to prolonged IV antibiotics   Acute hepatitis: Likely from TPN.  Expect improvement now that he is coming off TPN. -Monitor   Alcohol abuse: Out of the window for withdrawal complications.   -Continue thiamine and folate   Acute Blood Loss Anemia, postsurgical: S/p 1 unit of pRBCs on 9/10 with stabilization in hemoglobin since.  Recent Labs    10/27/20 0500 10/28/20 0212  10/29/20 1008 10/30/20 0328 10/31/20 0352 11/02/20 0925 11/04/20 0847 11/05/20 0345 11/06/20 0620 11/08/20 0557  HGB 8.2* 8.6* 8.6* 8.3* 8.4* 8.6* 7.8* 8.0* 8.5* 8.3*  -Continue monitoring  Controlled DM-2 with hyperglycemia: A1c 6.8% on admission. Recent Labs  Lab 11/07/20 1518 11/07/20 2000 11/08/20 0005 11/08/20 0425 11/08/20 0801  GLUCAP 96 143* 135* 158* 157*  -Continue SSI-high every 4 hours -Hold basal and tube feed coverage -Decrease dextrose 10% to 40 cc an hour -Further adjustment as appropriate   Hypokalemia/hypomagnesemia/hypophosphatemia: Resolved.   Dysphagia: Cleared by SLP -Currently n.p.o. pending procedure  Debility/physical deconditioning -Continue PT/OT  Severe protein calorie malnutrition in the setting of n.p.o. as evidenced by anasarca, hypoalbuminemia Body mass index is 31.29 kg/m. Nutrition Problem: Severe Malnutrition Etiology: acute illness (infected necrotic pancreatitis) Signs/Symptoms: moderate fat depletion, moderate muscle depletion, energy intake < or equal to 50% for > or equal to 5 days, percent weight loss (8.4% weight loss in 1.5 months) Percent weight loss: 8.4 % (1.5 months) Interventions: TPN, Tube feeding, Refer to RD note for recommendations   DVT prophylaxis:  heparin injection 5,000 Units Start: 10/22/20 1400 Place and maintain sequential compression device Start: 11/12/2020 2202 SCDs Start: 10/14/2020 2145  Code Status: Full code Family Communication: Updated patient's wife at bedside Level of care: Progressive Status is: Inpatient  Remains inpatient appropriate because:Hemodynamically unstable, Ongoing active pain requiring inpatient pain management, Ongoing diagnostic testing needed not appropriate for  outpatient work up, IV treatments appropriate due to intensity of illness or inability to take PO, and Inpatient level of care appropriate due to severity of illness  Dispo: The patient is from: Home               Anticipated d/c is to: CIR              Patient currently is not medically stable to d/c.   Difficult to place patient No       Consultants:  General surgery Infectious disease Interventional radiology   Sch Meds:  Scheduled Meds:  chlorhexidine  15 mL Mouth Rinse BID   Chlorhexidine Gluconate Cloth  6 each Topical Q0600   feeding supplement (PROSource TF)  45 mL Per Tube TID   fentaNYL       fluticasone furoate-vilanterol  1 puff Inhalation Daily   And   umeclidinium bromide  1 puff Inhalation Daily   folic acid  1 mg Per Tube Daily   heparin  5,000 Units Subcutaneous Q8H   insulin aspart  0-20 Units Subcutaneous Q4H   mouth rinse  15 mL Mouth Rinse q12n4p   midazolam       pantoprazole (PROTONIX) IV  40 mg Intravenous Q24H   sodium chloride flush  10-40 mL Intracatheter Q12H   sodium chloride flush  10-40 mL Intracatheter Q12H   thiamine  100 mg Per Tube Daily   Continuous Infusions:  sodium chloride Stopped (10/31/20 0131)   dextrose 50 mL/hr at 11/08/20 0610   feeding supplement (VITAL 1.5 CAL) 1,000 mL (11/06/20 1734)   piperacillin-tazobactam (ZOSYN)  IV 3.375 g (11/08/20 0549)   PRN Meds:.sodium chloride, acetaminophen (TYLENOL) oral liquid 160 mg/5 mL, acetaminophen, albuterol, docusate, haloperidol lactate, HYDROmorphone (DILAUDID) injection, midazolam, ondansetron (ZOFRAN) IV, polyethylene glycol, sodium chloride flush, sodium chloride flush  Antimicrobials: Anti-infectives (From admission, onward)    Start     Dose/Rate Route Frequency Ordered Stop   11/06/20 1545  piperacillin-tazobactam (ZOSYN) IVPB 3.375 g        3.375 g 12.5 mL/hr over 240 Minutes Intravenous Every 8 hours 11/06/20 1453     11/01/20 0900  levofloxacin (LEVAQUIN) IVPB 750 mg  Status:  Discontinued        750 mg 100 mL/hr over 90 Minutes Intravenous Every 24 hours 11/01/20 0824 11/06/20 1408   11/01/20 0900  metroNIDAZOLE (FLAGYL) IVPB 500 mg  Status:  Discontinued        500 mg 100  mL/hr over 60 Minutes Intravenous Every 12 hours 11/01/20 0824 11/06/20 1408   10/29/20 2200  meropenem (MERREM) 1 g in sodium chloride 0.9 % 100 mL IVPB  Status:  Discontinued        1 g 200 mL/hr over 30 Minutes Intravenous Every 8 hours 10/29/20 1428 11/01/20 0824   10/28/20 1615  meropenem (MERREM) 2 g in sodium chloride 0.9 % 100 mL IVPB  Status:  Discontinued        2 g 200 mL/hr over 30 Minutes Intravenous Every 8 hours 10/28/20 1515 10/29/20 1428   10/28/20 1445  metroNIDAZOLE (FLAGYL) IVPB 500 mg  Status:  Discontinued        500 mg 100 mL/hr over 60 Minutes Intravenous Every 12 hours 10/28/20 1348 10/28/20 1505   10/25/20 2300  fluconazole (DIFLUCAN) IVPB 400 mg        400 mg 100 mL/hr over 120 Minutes Intravenous Every 24 hours 10/25/20 0742 10/29/20 0718   10/24/20 2300  fluconazole (DIFLUCAN) IVPB  200 mg  Status:  Discontinued        200 mg 100 mL/hr over 60 Minutes Intravenous Every 24 hours 10/24/20 0943 10/25/20 0742   10/22/20 1800  vancomycin (VANCOREADY) IVPB 1500 mg/300 mL  Status:  Discontinued        1,500 mg 150 mL/hr over 120 Minutes Intravenous Every 24 hours 10/22/2020 2225 10/22/20 0759   10/22/20 0900  linezolid (ZYVOX) IVPB 600 mg  Status:  Discontinued        600 mg 300 mL/hr over 60 Minutes Intravenous Every 12 hours 10/22/20 0800 10/27/20 0852   10/22/20 0300  piperacillin-tazobactam (ZOSYN) IVPB 3.375 g  Status:  Discontinued        3.375 g 12.5 mL/hr over 240 Minutes Intravenous Every 8 hours 11/04/2020 2225 10/28/20 1514   11/09/2020 2335  metroNIDAZOLE (FLAGYL) IVPB 500 mg  Status:  Discontinued        500 mg 100 mL/hr over 60 Minutes Intravenous Every 12 hours 10/30/2020 2142 10/22/20 0747   10/26/2020 2248  fluconazole (DIFLUCAN) IVPB 400 mg  Status:  Discontinued        400 mg 100 mL/hr over 120 Minutes Intravenous Every 24 hours 10/29/2020 2142 10/24/20 0943   10/16/2020 1748  vancomycin (VANCOREADY) IVPB 2000 mg/400 mL        2,000 mg 200 mL/hr over 120  Minutes Intravenous  Once 10/14/2020 1706 11/09/2020 2113   11/02/2020 1715  piperacillin-tazobactam (ZOSYN) IVPB 3.375 g        3.375 g 100 mL/hr over 30 Minutes Intravenous  Once 10/26/2020 1706 10/23/2020 1903        I have personally reviewed the following labs and images: CBC: Recent Labs  Lab 11/02/20 0925 11/04/20 0847 11/05/20 0345 11/06/20 0620 11/08/20 0557  WBC 9.4 8.2 8.6 10.7* 13.0*  NEUTROABS 7.8* 6.4  --  8.0*  --   HGB 8.6* 7.8* 8.0* 8.5* 8.3*  HCT 27.3* 25.1* 25.0* 27.1* 25.9*  MCV 105.8* 104.6* 104.2* 105.4* 100.4*  PLT 159 191 215 235 326   BMP &GFR Recent Labs  Lab 11/02/20 0925 11/04/20 0847 11/06/20 0617 11/07/20 0440 11/08/20 0557  NA 136 137 139 136 135  K 4.0 4.0 3.8 3.9 4.0  CL 101 104 105 100 97*  CO2 27 27 27 29 30   GLUCOSE 230* 236* 215* 155* 172*  BUN 17 16 17 16 16   CREATININE 0.61 0.58* 0.51* 0.65 0.70  CALCIUM 8.6* 8.2* 7.9* 8.1* 7.9*  MG  --  1.9  --  1.8 2.0  PHOS  --  3.1 2.9 3.2 4.0   Estimated Creatinine Clearance: 123.6 mL/min (by C-G formula based on SCr of 0.7 mg/dL). Liver & Pancreas: Recent Labs  Lab 11/04/20 0847 11/06/20 0617 11/06/20 0618 11/07/20 0440 11/08/20 0557  AST 23  --  26 24 20   ALT 17  --  16 15 14   ALKPHOS 76  --  89 82 76  BILITOT 0.5  --  0.6 0.7 0.8  PROT 5.6*  --  5.9* 5.9* 6.3*  ALBUMIN 1.4* 1.3* 1.3* 1.9* 1.7*   No results for input(s): LIPASE, AMYLASE in the last 168 hours. Recent Labs  Lab 11/07/20 0440  AMMONIA 38*   Diabetic: No results for input(s): HGBA1C in the last 72 hours. Recent Labs  Lab 11/07/20 1518 11/07/20 2000 11/08/20 0005 11/08/20 0425 11/08/20 0801  GLUCAP 96 143* 135* 158* 157*   Cardiac Enzymes: Recent Labs  Lab 11/07/20 0440  CKTOTAL 10*  No results for input(s): PROBNP in the last 8760 hours. Coagulation Profile: No results for input(s): INR, PROTIME in the last 168 hours. Thyroid Function Tests: Recent Labs    11/07/20 0440  TSH 8.574*   Lipid  Profile: Recent Labs    11/08/20 0557  TRIG 101   Anemia Panel: No results for input(s): VITAMINB12, FOLATE, FERRITIN, TIBC, IRON, RETICCTPCT in the last 72 hours. Urine analysis:    Component Value Date/Time   COLORURINE AMBER (A) 11/07/2020 1549   APPEARANCEUR CLEAR 11-07-2020 1549   LABSPEC 1.015 Nov 07, 2020 1549   PHURINE 6.0 11/07/2020 1549   GLUCOSEU NEGATIVE 07-Nov-2020 1549   HGBUR TRACE (A) 11/07/2020 1549   BILIRUBINUR SMALL (A) 07-Nov-2020 1549   KETONESUR 15 (A) 07-Nov-2020 1549   PROTEINUR NEGATIVE 11/07/20 1549   NITRITE NEGATIVE 11/07/20 1549   LEUKOCYTESUR NEGATIVE Nov 07, 2020 1549   Sepsis Labs: Invalid input(s): PROCALCITONIN, LACTICIDVEN  Microbiology: Recent Results (from the past 240 hour(s))  Culture, blood (Routine X 2) w Reflex to ID Panel     Status: None   Collection Time: 11/03/20 11:23 AM   Specimen: BLOOD  Result Value Ref Range Status   Specimen Description BLOOD LEFT ANTECUBITAL  Final   Special Requests   Final    BOTTLES DRAWN AEROBIC ONLY Blood Culture adequate volume   Culture   Final    NO GROWTH 5 DAYS Performed at [facility], [address], [place], [place] [address]    Report Status 11/08/2020 FINAL  Final  Culture, blood (Routine X 2) w Reflex to ID Panel     Status: None   Collection Time: 11/03/20 11:24 AM   Specimen: BLOOD LEFT HAND  Result Value Ref Range Status   Specimen Description BLOOD LEFT HAND  Final   Special Requests   Final    BOTTLES DRAWN AEROBIC ONLY Blood Culture adequate volume   Culture   Final    NO GROWTH 5 DAYS Performed at [facility], [address], [place], [place] [address]    Report Status 11/08/2020 FINAL  Final  Culture, blood (routine x 2)     Status: None (Preliminary result)   Collection Time: 11/07/20 11:45 AM   Specimen: BLOOD LEFT ARM  Result Value Ref Range Status   Specimen Description BLOOD LEFT ARM  Final   Special Requests   Final    BOTTLES DRAWN AEROBIC AND  ANAEROBIC Blood Culture adequate volume   Culture   Final    NO GROWTH 1 DAY Performed at [facility], [address], [place], [place] [address]    Report Status PENDING  Incomplete  Culture, blood (routine x 2)     Status: None (Preliminary result)   Collection Time: 11/07/20 11:51 AM   Specimen: BLOOD LEFT HAND  Result Value Ref Range Status   Specimen Description BLOOD LEFT HAND  Final   Special Requests   Final    BOTTLES DRAWN AEROBIC ONLY Blood Culture adequate volume   Culture   Final    NO GROWTH 1 DAY Performed at [facility], [address] [place], [place] [address]    Report Status PENDING  Incomplete    Radiology Studies: No results found.   Zamya Culhane T. Shevaun Lovan [facility]  If 7PM-7AM, please contact night-coverage [email or web address] 11/08/2020, 12:43 PM

## 2020-11-08 NOTE — Progress Notes (Signed)
Physical Therapy Treatment Patient Details Name: Paul Chambers MRN: [id number] DOB: December 26, 1965 Today's Date: 11/08/2020   History of Present Illness Pt adm 9/8 with abdominal pain and AMS. Pt found to have extensive pneumoperitoneum and underwent ex lap on 9/9. Pt with infected necrotizing pancreatitis. Pt with acute hypoxic respiratory failure. Pt intubated 9/9-9/12. PMH - etoh, HTN, gout, pancreatitis.    PT Comments    Treatment limited to pain and pt willingness to participate. Repositioned in the bed as feet were resting flat against footboard of bed. Noted pt laying on wet pads due to leaking from drains/incision sites. RN present to give pain meds and attempted to convince pt to let us change the pads, however pt refusing to roll or move any further. Became frustrated, approaching agitation. Will continue to follow.    Recommendations for follow up therapy are one component of a multi-disciplinary discharge planning process, led by the attending physician.  Recommendations may be updated based on patient status, additional functional criteria and insurance authorization.  Follow Up Recommendations  CIR     Equipment Recommendations  Rolling walker with 5" wheels    Recommendations for Other Services Rehab consult     Precautions / Restrictions Precautions Precautions: Fall Precaution Comments: Several abdominal drains. Restrictions Weight Bearing Restrictions: No     Mobility  Bed Mobility Overal bed mobility: Needs Assistance             General bed mobility comments: HOB initially raised, and pt with increased pain to lower head down for repositioning in the bed. He was able to bend B knees up to help advance towards HOB. +2 assist with bed pad to scoot him up.    Transfers                 General transfer comment: Pt refused  Ambulation/Gait             General Gait Details: Pt refused   Stairs             Wheelchair Mobility     Modified Rankin (Stroke Patients Only)       Balance       Sitting balance - Comments: Unable to be assessed this session       Standing balance comment: Unable to be assessed this session                            Cognition Arousal/Alertness: Awake/alert Behavior During Therapy: Agitated (vs frustrated) Overall Cognitive Status: Impaired/Different from baseline Area of Impairment: Following commands;Problem solving;Safety/judgement                       Following Commands: Follows one step commands with increased time Safety/Judgement: Decreased awareness of safety   Problem Solving: Slow processing;Difficulty sequencing;Requires verbal cues;Requires tactile cues        Exercises      General Comments General comments (skin integrity, edema, etc.): Pt on 4L/min supplemental O2 during session with sats 88-89%.      Pertinent Vitals/Pain Pain Assessment: Faces Faces Pain Scale: Hurts even more Pain Location: abdomen Pain Descriptors / Indicators: Sore;Discomfort Pain Intervention(s): Limited activity within patient's tolerance;Monitored during session;Repositioned    Home Living                      Prior Function            PT Goals (current goals can  now be found in the care plan section) Acute Rehab PT Goals Patient Stated Goal: To go to CIR. PT Goal Formulation: With patient Time For Goal Achievement: 11/12/20 Potential to Achieve Goals: Good Progress towards PT goals: Progressing toward goals    Frequency    Min 3X/week      PT Plan Current plan remains appropriate    Co-evaluation              AM-PAC PT "6 Clicks" Mobility   Outcome Measure  Help needed turning from your back to your side while in a flat bed without using bedrails?: A Lot Help needed moving from lying on your back to sitting on the side of a flat bed without using bedrails?: A Lot Help needed moving to and from a bed to a chair  (including a wheelchair)?: A Lot Help needed standing up from a chair using your arms (e.g., wheelchair or bedside chair)?: A Lot Help needed to walk in hospital room?: A Lot Help needed climbing 3-5 steps with a railing? : Total 6 Click Score: 11    End of Session Equipment Utilized During Treatment: Gait belt Activity Tolerance: Patient limited by pain;Treatment limited secondary to agitation Patient left: in bed;with call bell/phone within reach;with nursing/sitter in room Nurse Communication: Mobility status PT Visit Diagnosis: Unsteadiness on feet (R26.81);Other abnormalities of gait and mobility (R26.89);Muscle weakness (generalized) (M62.81)     Time: 6256-3893 PT Time Calculation (min) (ACUTE ONLY): 12 min  Charges:  $Therapeutic Activity: 8-22 mins                     Conni Slipper, PT, DPT Acute Rehabilitation Services Pager: [phone] Office: [phone]    Marylynn Pearson 11/08/2020, 6:02 PM

## 2020-11-08 NOTE — Progress Notes (Signed)
Patient is off the unit for tests/procedures, unable to complete vitals and noon assessment.

## 2020-11-08 NOTE — Progress Notes (Signed)
18 Days Post-Op  Subjective: CC: 3 additional perc drains placed in IR this morning (perihepatic, RLQ, LLQ).  No new complaints, ongoing mild right sided abdominal pain. Reports flatus and liquid BMs.  Objective: Vital signs in last 24 hours: Temp:  [98.3 F (36.8 C)-98.9 F (37.2 C)] 98.8 F (37.1 C) (09/26 0844) Pulse Rate:  [95-103] 98 (09/26 1120) Resp:  [14-20] 14 (09/26 1120) BP: (125-137)/(73-83) 130/83 (09/26 1120) SpO2:  [90 %-92 %] 90 % (09/26 1120) Weight:  [96.1 kg] 96.1 kg (09/26 0500) Last BM Date: 11/07/20  Intake/Output from previous day: 09/25 0701 - 09/26 0700 In: 744.4 [I.V.:499.4; IV Piggyback:245] Out: 1000 [Urine:1000] D1 -50 D2 - 105 Intake/Output this shift: Total I/O In: 10 [I.V.:10] Out: 35 [Drains:35]  PE: Gen:  Awake and alert, sitting up in chair in nad Heart: Tachycardic  Pulm: normal effort on nasal cannula, Abd: Distended but soft, TTP without rigidity or guarding.  RUQ surgical JP drains x2 with brown fluid in bulbs RUQ IR drain SS  RLQ IR drain purulent LLQ IR drain purulent Midline wound pale pink with early granulation, and clean without dehiscence Laparoscopic site c/d/i Msk: 2+ pitting edema of the b/l LE's   Lab Results:  Recent Labs    11/06/20 0620 11/08/20 0557  WBC 10.7* 13.0*  HGB 8.5* 8.3*  HCT 27.1* 25.9*  PLT 235 326   BMET Recent Labs    11/07/20 0440 11/08/20 0557  NA 136 135  K 3.9 4.0  CL 100 97*  CO2 29 30  GLUCOSE 155* 172*  BUN 16 16  CREATININE 0.65 0.70  CALCIUM 8.1* 7.9*   PT/INR No results for input(s): LABPROT, INR in the last 72 hours. CMP     Component Value Date/Time   NA 135 11/08/2020 0557   K 4.0 11/08/2020 0557   CL 97 (L) 11/08/2020 0557   CO2 30 11/08/2020 0557   GLUCOSE 172 (H) 11/08/2020 0557   BUN 16 11/08/2020 0557   CREATININE 0.70 11/08/2020 0557   CALCIUM 7.9 (L) 11/08/2020 0557   PROT 6.3 (L) 11/08/2020 0557   ALBUMIN 1.7 (L) 11/08/2020 0557   AST 20  11/08/2020 0557   ALT 14 11/08/2020 0557   ALKPHOS 76 11/08/2020 0557   BILITOT 0.8 11/08/2020 0557   GFRNONAA >60 11/08/2020 0557   Lipase     Component Value Date/Time   LIPASE 25 11-03-2020 1506    Studies/Results: CT ABDOMEN PELVIS W CONTRAST  Result Date: 11/06/2020 CLINICAL DATA:  Abdominal abscess or infection. EXAM: CT ABDOMEN AND PELVIS WITH CONTRAST TECHNIQUE: Multidetector CT imaging of the abdomen and pelvis was performed using the standard protocol following bolus administration of intravenous contrast. CONTRAST:  57mL OMNIPAQUE IOHEXOL 300 MG/ML  SOLN COMPARISON:  October 29, 2020. FINDINGS: Lower chest: Bilateral pleural effusions are noted with adjacent atelectasis or infiltrates of both lower lobes. Hepatobiliary: No focal liver abnormality is seen. No gallstones, gallbladder wall thickening, or biliary dilatation. Pancreas: Unremarkable. No pancreatic ductal dilatation or surrounding inflammatory changes. Spleen: Normal in size without focal abnormality. Adrenals/Urinary Tract: Adrenal glands are unremarkable. Kidneys are normal, without renal calculi, focal lesion, or hydronephrosis. Bladder is unremarkable. Stomach/Bowel: Feeding tube is seen passing through the stomach in the duodenum with distal tip in expected position of proximal jejunum. Stomach is otherwise unremarkable. No abnormal bowel dilatation or inflammation is noted. The appendix appears normal. Vascular/Lymphatic: No significant vascular findings are present. No enlarged abdominal or pelvic lymph nodes. Reproductive: Prostate is  unremarkable. Other: Stable position of 2 surgical drains entering right lower quadrant, with 1 tip seen inferior to the liver and the other tip extending into the left upper quadrant inferior to proximal stomach. Grossly stable gas and fluid collections are seen in the retroperitoneum which are drain by these tubes. There is an increased amount of fluid noted around the liver with an  increased amount of gas in its non dependent portion suggesting persistent bowel leak. Remains a large amount of fluid and gas in a collection in the left pericolic gutter. Musculoskeletal: No acute or significant osseous findings. IMPRESSION: Bilateral pleural effusions are again noted with adjacent atelectasis or infiltrates of both lower lobes. Stable position of 2 surgical drains entering right lower quadrant, with tip seen inferior to the liver and inferior to the proximal stomach in the left upper quadrant. Grossly stable gas and fluid collections are seen throughout the retroperitoneum which are drain by these tubes. Stable gas and fluid collection is noted in left pericolic gutter. Mildly increased amount of fluid is noted around the liver with an increased amount of air seen in the non dependent portion concerning for persistent bowel leak. Distal tip of feeding tube appears to be in the proximal jejunum. Electronically Signed   By: Lupita Raider M.D.   On: 11/06/2020 18:58    Anti-infectives: Anti-infectives (From admission, onward)    Start     Dose/Rate Route Frequency Ordered Stop   11/06/20 1545  piperacillin-tazobactam (ZOSYN) IVPB 3.375 g        3.375 g 12.5 mL/hr over 240 Minutes Intravenous Every 8 hours 11/06/20 1453     11/01/20 0900  levofloxacin (LEVAQUIN) IVPB 750 mg  Status:  Discontinued        750 mg 100 mL/hr over 90 Minutes Intravenous Every 24 hours 11/01/20 0824 11/06/20 1408   11/01/20 0900  metroNIDAZOLE (FLAGYL) IVPB 500 mg  Status:  Discontinued        500 mg 100 mL/hr over 60 Minutes Intravenous Every 12 hours 11/01/20 0824 11/06/20 1408   10/29/20 2200  meropenem (MERREM) 1 g in sodium chloride 0.9 % 100 mL IVPB  Status:  Discontinued        1 g 200 mL/hr over 30 Minutes Intravenous Every 8 hours 10/29/20 1428 11/01/20 0824   10/28/20 1615  meropenem (MERREM) 2 g in sodium chloride 0.9 % 100 mL IVPB  Status:  Discontinued        2 g 200 mL/hr over 30 Minutes  Intravenous Every 8 hours 10/28/20 1515 10/29/20 1428   10/28/20 1445  metroNIDAZOLE (FLAGYL) IVPB 500 mg  Status:  Discontinued        500 mg 100 mL/hr over 60 Minutes Intravenous Every 12 hours 10/28/20 1348 10/28/20 1505   10/25/20 2300  fluconazole (DIFLUCAN) IVPB 400 mg        400 mg 100 mL/hr over 120 Minutes Intravenous Every 24 hours 10/25/20 0742 10/29/20 0718   10/24/20 2300  fluconazole (DIFLUCAN) IVPB 200 mg  Status:  Discontinued        200 mg 100 mL/hr over 60 Minutes Intravenous Every 24 hours 10/24/20 0943 10/25/20 0742   10/22/20 1800  vancomycin (VANCOREADY) IVPB 1500 mg/300 mL  Status:  Discontinued        1,500 mg 150 mL/hr over 120 Minutes Intravenous Every 24 hours 11/09/2020 2225 10/22/20 0759   10/22/20 0900  linezolid (ZYVOX) IVPB 600 mg  Status:  Discontinued  600 mg 300 mL/hr over 60 Minutes Intravenous Every 12 hours 10/22/20 0800 10/27/20 0852   10/22/20 0300  piperacillin-tazobactam (ZOSYN) IVPB 3.375 g  Status:  Discontinued        3.375 g 12.5 mL/hr over 240 Minutes Intravenous Every 8 hours 10/31/2020 2225 10/28/20 1514   2020-10-31 2335  metroNIDAZOLE (FLAGYL) IVPB 500 mg  Status:  Discontinued        500 mg 100 mL/hr over 60 Minutes Intravenous Every 12 hours 10/31/20 2142 10/22/20 0747   10-31-20 2248  fluconazole (DIFLUCAN) IVPB 400 mg  Status:  Discontinued        400 mg 100 mL/hr over 120 Minutes Intravenous Every 24 hours October 31, 2020 2142 10/24/20 0943   2020-10-31 1748  vancomycin (VANCOREADY) IVPB 2000 mg/400 mL        2,000 mg 200 mL/hr over 120 Minutes Intravenous  Once Oct 31, 2020 1706 Oct 31, 2020 2113   2020-10-31 1715  piperacillin-tazobactam (ZOSYN) IVPB 3.375 g        3.375 g 100 mL/hr over 30 Minutes Intravenous  Once 31-Oct-2020 1706 2020/10/31 1903        Assessment/Plan POD 17 s/p ex lap with pancreatic debridement for infected necrotic pancreatitis by Dr. Sheliah Hatch on 9/9 - Superior drain under the mesocolon along with the head of the  pancreas - Inferior drain under the mesocolon along the left upper abdomen and what is likely a position inferior to the pancreas - CT 9/24 w/ gas and fluid collections that are grossly stable in the retroperitoneum and are drained by JP tubes. Cont drains - CT 9/24 also with mildly increased amount of fluid is noted around the liver with an increased amount of air seen in the non dependent portion concerning for bowel leak. Febrile to 102.2 9/25. Intermittent sinus tachycardia.  - s/p IR drains 9/26. Follow cx  - BID WTD - Cont therapies, rec CIR who is following - Pulm toilet - Appreciate TRH's and ID's assistance w/ his care   FEN: NPO, TF's held ID: [email or web address] VTE: SCDs, heparin subq. LE [place] neg for DVT's Foley - out, ext cath, voiding    - Per primary -  VDRF - now extubated PNA w/ + resp cx's - cx's w/ stenotrophomonas maltophilia - on abx B/l pleural effusions Abl anemia  ETOH use  AKI - resolved T2DM   LOS: 18 days    Adam Phenix , [facility] 11/08/2020, 12:01 PM Please see Amion for pager number during day hours 7:00am-4:30pm

## 2020-11-09 DIAGNOSIS — A419 Sepsis, unspecified organism: Secondary | ICD-10-CM | POA: Diagnosis not present

## 2020-11-09 DIAGNOSIS — D539 Nutritional anemia, unspecified: Secondary | ICD-10-CM | POA: Diagnosis not present

## 2020-11-09 DIAGNOSIS — E8809 Other disorders of plasma-protein metabolism, not elsewhere classified: Secondary | ICD-10-CM | POA: Diagnosis not present

## 2020-11-09 DIAGNOSIS — K8591 Acute pancreatitis with uninfected necrosis, unspecified: Secondary | ICD-10-CM | POA: Diagnosis not present

## 2020-11-09 LAB — GLUCOSE, CAPILLARY
Glucose-Capillary: 171 mg/dL — ABNORMAL HIGH (ref 70–99)
Glucose-Capillary: 179 mg/dL — ABNORMAL HIGH (ref 70–99)
Glucose-Capillary: 195 mg/dL — ABNORMAL HIGH (ref 70–99)
Glucose-Capillary: 177 mg/dL — ABNORMAL HIGH (ref 70–99)
Glucose-Capillary: 191 mg/dL — ABNORMAL HIGH (ref 70–99)

## 2020-11-09 LAB — RENAL FUNCTION PANEL
Albumin: 1.6 g/dL — ABNORMAL LOW (ref 3.5–5.0)
Anion gap: 7 (ref 5–15)
BUN: 13 mg/dL (ref 6–20)
CO2: 30 mmol/L (ref 22–32)
Calcium: 7.9 mg/dL — ABNORMAL LOW (ref 8.9–10.3)
Chloride: 96 mmol/L — ABNORMAL LOW (ref 98–111)
Creatinine, Ser: 0.61 mg/dL (ref 0.61–1.24)
GFR, Estimated: >60 mL/min (ref >60)
Glucose, Bld: 180 mg/dL — ABNORMAL HIGH (ref 70–99)
Phosphorus: 3.2 mg/dL (ref 2.5–4.6)
Potassium: 3.5 mmol/L (ref 3.5–5.1)
Sodium: 133 mmol/L — ABNORMAL LOW (ref 135–145)

## 2020-11-09 LAB — CBC
HCT: 25.8 % — ABNORMAL LOW (ref 39.0–52.0)
Hemoglobin: 8.1 g/dL — ABNORMAL LOW (ref 13.0–17.0)
MCH: 32.1 pg (ref 26.0–34.0)
MCHC: 31.4 g/dL (ref 30.0–36.0)
MCV: 102.4 fL — ABNORMAL HIGH (ref 80.0–100.0)
Platelets: 350 10*3/uL (ref 150–400)
RBC: 2.52 MIL/uL — ABNORMAL LOW (ref 4.22–5.81)
RDW: 18.4 % — ABNORMAL HIGH (ref 11.5–15.5)
WBC: 13.4 10*3/uL — ABNORMAL HIGH (ref 4.0–10.5)
nRBC: 0.7 % — ABNORMAL HIGH (ref 0.0–0.2)

## 2020-11-09 LAB — MAGNESIUM: Magnesium: 1.9 mg/dL (ref 1.7–2.4)

## 2020-11-09 LAB — BRAIN NATRIURETIC PEPTIDE: B Natriuretic Peptide: 86.9 pg/mL (ref 0.0–100.0)

## 2020-11-09 MED ORDER — SODIUM CHLORIDE 0.9 % IV SOLN
3.0000 g | Freq: Four times a day (QID) | INTRAVENOUS | Status: DC
  Administered 2020-11-09 – 2020-11-12 (×12): 3 g via INTRAVENOUS
  Filled 2020-11-09 (×13): qty 8

## 2020-11-09 MED ORDER — INSULIN GLARGINE-YFGN 100 UNIT/ML ~~LOC~~ SOLN
10.0000 [IU] | Freq: Every day | SUBCUTANEOUS | Status: DC
  Administered 2020-11-09 – 2020-11-11 (×3): 10 [IU] via SUBCUTANEOUS
  Filled 2020-11-09 (×3): qty 0.1

## 2020-11-09 NOTE — Progress Notes (Signed)
Inpatient Rehab Admissions Coordinator:    Pt. Not medically stable for transfer to CIR at this time. I will continue to follow for potential admit pending insurance auth, bed availability, and medical readiness.  Megan Salon, MS, CCC-SLP Rehab Admissions Coordinator  [phone] (celll) [phone] (office)

## 2020-11-09 NOTE — Progress Notes (Signed)
Progress Note  19 Days Post-Op  Subjective: Patient is lying in bed this AM. Wife at bedside. Patient reports abdominal pain at drain sites, surgical drains seem to be more painful. Denies nausea this AM. He was having diarrhea prior to being NPO for drain placement yesterday. He has been working with PT but was unable to yesterday with procedure.  Objective: Vital signs in last 24 hours: Temp:  [98.4 F (36.9 C)-100.8 F (38.2 C)] 98.7 F (37.1 C) (09/27 0828) Pulse Rate:  [94-105] 97 (09/27 0852) Resp:  [14-26] 22 (09/27 0852) BP: (121-138)/(70-87) 124/74 (09/27 0828) SpO2:  [90 %-98 %] 92 % (09/27 0852) Last BM Date: 11/08/20  Intake/Output from previous day: 09/26 0701 - 09/27 0700 In: 70 [I.V.:10; NG/GT:60] Out: 2105 [Urine:1350; Drains:755] Intake/Output this shift: No intake/output data recorded.  PE: Gen:  Awake and alert, lying in bed Heart: RRR Pulm: normal effort on nasal cannula Abd: Distended but soft, diffusely TTP without rigidity or guarding.  RUQ surgical JP drains x2 with brown fluid in bulbs, dishwater looking fluid in tubing  RUQ IR drain SS  RLQ IR drain purulent LLQ IR drain purulent Midline wound pale pink with early granulation, and clean without dehiscence Laparoscopic sites c/d/i   Lab Results:  Recent Labs    11/08/20 0557 11/09/20 0500  WBC 13.0* 13.4*  HGB 8.3* 8.1*  HCT 25.9* 25.8*  PLT 326 350   BMET Recent Labs    11/08/20 0557 11/09/20 0450  NA 135 133*  K 4.0 3.5  CL 97* 96*  CO2 30 30  GLUCOSE 172* 180*  BUN 16 13  CREATININE 0.70 0.61  CALCIUM 7.9* 7.9*   PT/INR No results for input(s): LABPROT, INR in the last 72 hours. CMP     Component Value Date/Time   NA 133 (L) 11/09/2020 0450   K 3.5 11/09/2020 0450   CL 96 (L) 11/09/2020 0450   CO2 30 11/09/2020 0450   GLUCOSE 180 (H) 11/09/2020 0450   BUN 13 11/09/2020 0450   CREATININE 0.61 11/09/2020 0450   CALCIUM 7.9 (L) 11/09/2020 0450   PROT 6.3 (L)  11/08/2020 0557   ALBUMIN 1.6 (L) 11/09/2020 0450   AST 20 11/08/2020 0557   ALT 14 11/08/2020 0557   ALKPHOS 76 11/08/2020 0557   BILITOT 0.8 11/08/2020 0557   GFRNONAA >60 11/09/2020 0450   Lipase     Component Value Date/Time   LIPASE 25 10/30/2020 1506       Studies/Results: CT ASPIRATION  Result Date: 11/08/2020 INDICATION: History of necrotizing pancreatitis, now with multiple complex fluid collections within the abdomen and pelvis despite operative debridement and placement of two large bore surgical peripancreatic drainage catheters. Request made for CT-guided aspiration(s) versus drainage catheter(s) placement for infection source control purposes. EXAM: 1. CT-GUIDED PERCUTANEOUS DRAINAGE CATHETER PLACEMENT X3 2. CT-GUIDED ASPIRATION OF LEFT LOWER ABDOMINAL/PELVIC FLUID COLLECTION COMPARISON:  CT abdomen pelvis-11/06/2020; 10/29/2020; 11/06/2020 MEDICATIONS: The patient is currently admitted to the hospital and receiving intravenous antibiotics. The antibiotics were administered within an appropriate time frame prior to the initiation of the procedure. ANESTHESIA/SEDATION: Moderate (conscious) sedation was employed during this procedure. A total of Versed 1 mg and Fentanyl 25 mcg was administered intravenously. Moderate Sedation Time: 65 minutes. The patient's level of consciousness and vital signs were monitored continuously by radiology nursing throughout the procedure under my direct supervision. CONTRAST:  None COMPLICATIONS: None immediate. PROCEDURE: Informed written consent was obtained from the patient after a discussion of the risks,  benefits and alternatives to treatment. The patient was placed supine on the CT gantry and a pre procedural CT was performed re-demonstrating the known abscess/fluid collection within the perihepatic space with dominant mixed air and fluid containing collection measuring at least 18.0 x 4.6 cm (image 20, series 2), the approximately 11.5 x 5.3 cm  complex fluid collection within the left mid hemiabdomen (image 49, series 2), the approximately 9.4 x 2.6 cm complex fluid collection within the midline of the right mid abdomen at the level of the mesenteric root (image 64, series 2 as well as the ill-defined approximately 3.2 x 2.6 cm fluid collection within the left hemipelvis (image 94, series 3). The procedure was planned. A timeout was performed prior to the initiation of the procedure. The skin overlying the operative sites were prepped and draped in usual sterile fashion. The overlying soft tissues were anesthetized with 1% lidocaine with epinephrine. Each collection was targeted with an 18 gauge trocar needle. If purulent fluid was aspirated, then a short Amplatz wire was coiled within each collection. Ultimately each collection except the collection within the left hemipelvis was successfully cannulated with a short Amplatz wire allowing placement of 14 French drainage catheters at all locations. The collection within the left lower pelvis was not amenable to percutaneous drainage catheter placement secondary to inability to coil a wire within the collection however proximally 5 cc of bloody fluid was aspirated from this indeterminate fluid collection. The collection within the perihepatic space yielded 100 cc of blood tinged fluid. The collection within the right lower abdominal quadrant yielded 50 cc of purulent fluid while the collection within the left mid abdomen yielded 125 cc of purulent fluid. A representative aspirated sample from the collection with the left mid hemiabdomen was capped and sent to the laboratory for analysis All drainage catheters were flushed with a small amount of saline and all drainage catheters were connected to gravity bags and secured in place with interrupted sutures and StatLock devices. Dressings were applied. While uncomfortable lying supine on the CT gantry, the patient tolerated the procedure well without immediate  postprocedural complication. IMPRESSION: 1. Successful CT-guided placement of a 14 French drainage catheter within the complex collection within the left mid hemiabdomen yielding 125 cc of purulent fluid. A representative sample of aspirated fluid from this collection was capped and sent to the laboratory for analysis. 2. Successful CT-guided placement of 14 French perihepatic drainage catheter yielding 100 cc of blood tinged fluid. 3. Successful CT-guided placement of a 14 French drainage catheter within the complex fluid collection within the right lower abdomen at the level of the mesenteric root yielding 50 cc of purulent fluid. 4. Successful CT-guided aspiration of approximately 5 cc of blood tinged fluid from the indeterminate fluid collection within the left hemipelvis, not amenable to drainage catheter placement given inability to coil a wire within this poorly defined collection/hematoma. Electronically Signed   By: Simonne Come M.D.   On: 11/08/2020 14:42   CT IMAGE GUIDED DRAINAGE BY PERCUTANEOUS CATHETER  Result Date: 11/08/2020 INDICATION: History of necrotizing pancreatitis, now with multiple complex fluid collections within the abdomen and pelvis despite operative debridement and placement of two large bore surgical peripancreatic drainage catheters. Request made for CT-guided aspiration(s) versus drainage catheter(s) placement for infection source control purposes. EXAM: 1. CT-GUIDED PERCUTANEOUS DRAINAGE CATHETER PLACEMENT X3 2. CT-GUIDED ASPIRATION OF LEFT LOWER ABDOMINAL/PELVIC FLUID COLLECTION COMPARISON:  CT abdomen pelvis-11/06/2020; 10/29/2020; 11/05/2020 MEDICATIONS: The patient is currently admitted to the hospital and receiving intravenous  antibiotics. The antibiotics were administered within an appropriate time frame prior to the initiation of the procedure. ANESTHESIA/SEDATION: Moderate (conscious) sedation was employed during this procedure. A total of Versed 1 mg and Fentanyl 25 mcg  was administered intravenously. Moderate Sedation Time: 65 minutes. The patient's level of consciousness and vital signs were monitored continuously by radiology nursing throughout the procedure under my direct supervision. CONTRAST:  None COMPLICATIONS: None immediate. PROCEDURE: Informed written consent was obtained from the patient after a discussion of the risks, benefits and alternatives to treatment. The patient was placed supine on the CT gantry and a pre procedural CT was performed re-demonstrating the known abscess/fluid collection within the perihepatic space with dominant mixed air and fluid containing collection measuring at least 18.0 x 4.6 cm (image 20, series 2), the approximately 11.5 x 5.3 cm complex fluid collection within the left mid hemiabdomen (image 49, series 2), the approximately 9.4 x 2.6 cm complex fluid collection within the midline of the right mid abdomen at the level of the mesenteric root (image 64, series 2 as well as the ill-defined approximately 3.2 x 2.6 cm fluid collection within the left hemipelvis (image 94, series 3). The procedure was planned. A timeout was performed prior to the initiation of the procedure. The skin overlying the operative sites were prepped and draped in usual sterile fashion. The overlying soft tissues were anesthetized with 1% lidocaine with epinephrine. Each collection was targeted with an 18 gauge trocar needle. If purulent fluid was aspirated, then a short Amplatz wire was coiled within each collection. Ultimately each collection except the collection within the left hemipelvis was successfully cannulated with a short Amplatz wire allowing placement of 14 French drainage catheters at all locations. The collection within the left lower pelvis was not amenable to percutaneous drainage catheter placement secondary to inability to coil a wire within the collection however proximally 5 cc of bloody fluid was aspirated from this indeterminate fluid  collection. The collection within the perihepatic space yielded 100 cc of blood tinged fluid. The collection within the right lower abdominal quadrant yielded 50 cc of purulent fluid while the collection within the left mid abdomen yielded 125 cc of purulent fluid. A representative aspirated sample from the collection with the left mid hemiabdomen was capped and sent to the laboratory for analysis All drainage catheters were flushed with a small amount of saline and all drainage catheters were connected to gravity bags and secured in place with interrupted sutures and StatLock devices. Dressings were applied. While uncomfortable lying supine on the CT gantry, the patient tolerated the procedure well without immediate postprocedural complication. IMPRESSION: 1. Successful CT-guided placement of a 14 French drainage catheter within the complex collection within the left mid hemiabdomen yielding 125 cc of purulent fluid. A representative sample of aspirated fluid from this collection was capped and sent to the laboratory for analysis. 2. Successful CT-guided placement of 14 French perihepatic drainage catheter yielding 100 cc of blood tinged fluid. 3. Successful CT-guided placement of a 14 French drainage catheter within the complex fluid collection within the right lower abdomen at the level of the mesenteric root yielding 50 cc of purulent fluid. 4. Successful CT-guided aspiration of approximately 5 cc of blood tinged fluid from the indeterminate fluid collection within the left hemipelvis, not amenable to drainage catheter placement given inability to coil a wire within this poorly defined collection/hematoma. Electronically Signed   By: Simonne Come M.D.   On: 11/08/2020 14:42   CT IMAGE GUIDED DRAINAGE  BY PERCUTANEOUS CATHETER  Result Date: 11/08/2020 INDICATION: History of necrotizing pancreatitis, now with multiple complex fluid collections within the abdomen and pelvis despite operative debridement and  placement of two large bore surgical peripancreatic drainage catheters. Request made for CT-guided aspiration(s) versus drainage catheter(s) placement for infection source control purposes. EXAM: 1. CT-GUIDED PERCUTANEOUS DRAINAGE CATHETER PLACEMENT X3 2. CT-GUIDED ASPIRATION OF LEFT LOWER ABDOMINAL/PELVIC FLUID COLLECTION COMPARISON:  CT abdomen pelvis-11/06/2020; 10/29/2020; 11/08/2020 MEDICATIONS: The patient is currently admitted to the hospital and receiving intravenous antibiotics. The antibiotics were administered within an appropriate time frame prior to the initiation of the procedure. ANESTHESIA/SEDATION: Moderate (conscious) sedation was employed during this procedure. A total of Versed 1 mg and Fentanyl 25 mcg was administered intravenously. Moderate Sedation Time: 65 minutes. The patient's level of consciousness and vital signs were monitored continuously by radiology nursing throughout the procedure under my direct supervision. CONTRAST:  None COMPLICATIONS: None immediate. PROCEDURE: Informed written consent was obtained from the patient after a discussion of the risks, benefits and alternatives to treatment. The patient was placed supine on the CT gantry and a pre procedural CT was performed re-demonstrating the known abscess/fluid collection within the perihepatic space with dominant mixed air and fluid containing collection measuring at least 18.0 x 4.6 cm (image 20, series 2), the approximately 11.5 x 5.3 cm complex fluid collection within the left mid hemiabdomen (image 49, series 2), the approximately 9.4 x 2.6 cm complex fluid collection within the midline of the right mid abdomen at the level of the mesenteric root (image 64, series 2 as well as the ill-defined approximately 3.2 x 2.6 cm fluid collection within the left hemipelvis (image 94, series 3). The procedure was planned. A timeout was performed prior to the initiation of the procedure. The skin overlying the operative sites were  prepped and draped in usual sterile fashion. The overlying soft tissues were anesthetized with 1% lidocaine with epinephrine. Each collection was targeted with an 18 gauge trocar needle. If purulent fluid was aspirated, then a short Amplatz wire was coiled within each collection. Ultimately each collection except the collection within the left hemipelvis was successfully cannulated with a short Amplatz wire allowing placement of 14 French drainage catheters at all locations. The collection within the left lower pelvis was not amenable to percutaneous drainage catheter placement secondary to inability to coil a wire within the collection however proximally 5 cc of bloody fluid was aspirated from this indeterminate fluid collection. The collection within the perihepatic space yielded 100 cc of blood tinged fluid. The collection within the right lower abdominal quadrant yielded 50 cc of purulent fluid while the collection within the left mid abdomen yielded 125 cc of purulent fluid. A representative aspirated sample from the collection with the left mid hemiabdomen was capped and sent to the laboratory for analysis All drainage catheters were flushed with a small amount of saline and all drainage catheters were connected to gravity bags and secured in place with interrupted sutures and StatLock devices. Dressings were applied. While uncomfortable lying supine on the CT gantry, the patient tolerated the procedure well without immediate postprocedural complication. IMPRESSION: 1. Successful CT-guided placement of a 14 French drainage catheter within the complex collection within the left mid hemiabdomen yielding 125 cc of purulent fluid. A representative sample of aspirated fluid from this collection was capped and sent to the laboratory for analysis. 2. Successful CT-guided placement of 14 French perihepatic drainage catheter yielding 100 cc of blood tinged fluid. 3. Successful CT-guided placement of  a 14 French  drainage catheter within the complex fluid collection within the right lower abdomen at the level of the mesenteric root yielding 50 cc of purulent fluid. 4. Successful CT-guided aspiration of approximately 5 cc of blood tinged fluid from the indeterminate fluid collection within the left hemipelvis, not amenable to drainage catheter placement given inability to coil a wire within this poorly defined collection/hematoma. Electronically Signed   By: Simonne Come M.D.   On: 11/08/2020 14:42   CT IMAGE GUIDED DRAINAGE BY PERCUTANEOUS CATHETER  Result Date: 11/08/2020 INDICATION: History of necrotizing pancreatitis, now with multiple complex fluid collections within the abdomen and pelvis despite operative debridement and placement of two large bore surgical peripancreatic drainage catheters. Request made for CT-guided aspiration(s) versus drainage catheter(s) placement for infection source control purposes. EXAM: 1. CT-GUIDED PERCUTANEOUS DRAINAGE CATHETER PLACEMENT X3 2. CT-GUIDED ASPIRATION OF LEFT LOWER ABDOMINAL/PELVIC FLUID COLLECTION COMPARISON:  CT abdomen pelvis-11/06/2020; 10/29/2020; November 13, 2020 MEDICATIONS: The patient is currently admitted to the hospital and receiving intravenous antibiotics. The antibiotics were administered within an appropriate time frame prior to the initiation of the procedure. ANESTHESIA/SEDATION: Moderate (conscious) sedation was employed during this procedure. A total of Versed 1 mg and Fentanyl 25 mcg was administered intravenously. Moderate Sedation Time: 65 minutes. The patient's level of consciousness and vital signs were monitored continuously by radiology nursing throughout the procedure under my direct supervision. CONTRAST:  None COMPLICATIONS: None immediate. PROCEDURE: Informed written consent was obtained from the patient after a discussion of the risks, benefits and alternatives to treatment. The patient was placed supine on the CT gantry and a pre procedural CT was  performed re-demonstrating the known abscess/fluid collection within the perihepatic space with dominant mixed air and fluid containing collection measuring at least 18.0 x 4.6 cm (image 20, series 2), the approximately 11.5 x 5.3 cm complex fluid collection within the left mid hemiabdomen (image 49, series 2), the approximately 9.4 x 2.6 cm complex fluid collection within the midline of the right mid abdomen at the level of the mesenteric root (image 64, series 2 as well as the ill-defined approximately 3.2 x 2.6 cm fluid collection within the left hemipelvis (image 94, series 3). The procedure was planned. A timeout was performed prior to the initiation of the procedure. The skin overlying the operative sites were prepped and draped in usual sterile fashion. The overlying soft tissues were anesthetized with 1% lidocaine with epinephrine. Each collection was targeted with an 18 gauge trocar needle. If purulent fluid was aspirated, then a short Amplatz wire was coiled within each collection. Ultimately each collection except the collection within the left hemipelvis was successfully cannulated with a short Amplatz wire allowing placement of 14 French drainage catheters at all locations. The collection within the left lower pelvis was not amenable to percutaneous drainage catheter placement secondary to inability to coil a wire within the collection however proximally 5 cc of bloody fluid was aspirated from this indeterminate fluid collection. The collection within the perihepatic space yielded 100 cc of blood tinged fluid. The collection within the right lower abdominal quadrant yielded 50 cc of purulent fluid while the collection within the left mid abdomen yielded 125 cc of purulent fluid. A representative aspirated sample from the collection with the left mid hemiabdomen was capped and sent to the laboratory for analysis All drainage catheters were flushed with a small amount of saline and all drainage catheters  were connected to gravity bags and secured in place with interrupted sutures and StatLock devices.  Dressings were applied. While uncomfortable lying supine on the CT gantry, the patient tolerated the procedure well without immediate postprocedural complication. IMPRESSION: 1. Successful CT-guided placement of a 14 French drainage catheter within the complex collection within the left mid hemiabdomen yielding 125 cc of purulent fluid. A representative sample of aspirated fluid from this collection was capped and sent to the laboratory for analysis. 2. Successful CT-guided placement of 14 French perihepatic drainage catheter yielding 100 cc of blood tinged fluid. 3. Successful CT-guided placement of a 14 French drainage catheter within the complex fluid collection within the right lower abdomen at the level of the mesenteric root yielding 50 cc of purulent fluid. 4. Successful CT-guided aspiration of approximately 5 cc of blood tinged fluid from the indeterminate fluid collection within the left hemipelvis, not amenable to drainage catheter placement given inability to coil a wire within this poorly defined collection/hematoma. Electronically Signed   By: Simonne Come M.D.   On: 11/08/2020 14:42    Anti-infectives: Anti-infectives (From admission, onward)    Start     Dose/Rate Route Frequency Ordered Stop   11/06/20 1545  piperacillin-tazobactam (ZOSYN) IVPB 3.375 g        3.375 g 12.5 mL/hr over 240 Minutes Intravenous Every 8 hours 11/06/20 1453     11/01/20 0900  levofloxacin (LEVAQUIN) IVPB 750 mg  Status:  Discontinued        750 mg 100 mL/hr over 90 Minutes Intravenous Every 24 hours 11/01/20 0824 11/06/20 1408   11/01/20 0900  metroNIDAZOLE (FLAGYL) IVPB 500 mg  Status:  Discontinued        500 mg 100 mL/hr over 60 Minutes Intravenous Every 12 hours 11/01/20 0824 11/06/20 1408   10/29/20 2200  meropenem (MERREM) 1 g in sodium chloride 0.9 % 100 mL IVPB  Status:  Discontinued        1 g 200  mL/hr over 30 Minutes Intravenous Every 8 hours 10/29/20 1428 11/01/20 0824   10/28/20 1615  meropenem (MERREM) 2 g in sodium chloride 0.9 % 100 mL IVPB  Status:  Discontinued        2 g 200 mL/hr over 30 Minutes Intravenous Every 8 hours 10/28/20 1515 10/29/20 1428   10/28/20 1445  metroNIDAZOLE (FLAGYL) IVPB 500 mg  Status:  Discontinued        500 mg 100 mL/hr over 60 Minutes Intravenous Every 12 hours 10/28/20 1348 10/28/20 1505   10/25/20 2300  fluconazole (DIFLUCAN) IVPB 400 mg        400 mg 100 mL/hr over 120 Minutes Intravenous Every 24 hours 10/25/20 0742 10/29/20 0718   10/24/20 2300  fluconazole (DIFLUCAN) IVPB 200 mg  Status:  Discontinued        200 mg 100 mL/hr over 60 Minutes Intravenous Every 24 hours 10/24/20 0943 10/25/20 0742   10/22/20 1800  vancomycin (VANCOREADY) IVPB 1500 mg/300 mL  Status:  Discontinued        1,500 mg 150 mL/hr over 120 Minutes Intravenous Every 24 hours 11-02-20 2225 10/22/20 0759   10/22/20 0900  linezolid (ZYVOX) IVPB 600 mg  Status:  Discontinued        600 mg 300 mL/hr over 60 Minutes Intravenous Every 12 hours 10/22/20 0800 10/27/20 0852   10/22/20 0300  piperacillin-tazobactam (ZOSYN) IVPB 3.375 g  Status:  Discontinued        3.375 g 12.5 mL/hr over 240 Minutes Intravenous Every 8 hours 02-Nov-2020 2225 10/28/20 1514   11/02/2020 2335  metroNIDAZOLE (FLAGYL) IVPB  500 mg  Status:  Discontinued        500 mg 100 mL/hr over 60 Minutes Intravenous Every 12 hours 10/31/2020 2142 10/22/20 0747   10/29/2020 2248  fluconazole (DIFLUCAN) IVPB 400 mg  Status:  Discontinued        400 mg 100 mL/hr over 120 Minutes Intravenous Every 24 hours 10/28/2020 2142 10/24/20 0943   10/30/2020 1748  vancomycin (VANCOREADY) IVPB 2000 mg/400 mL        2,000 mg 200 mL/hr over 120 Minutes Intravenous  Once 10/25/2020 1706 10/28/2020 2113   11/09/2020 1715  piperacillin-tazobactam (ZOSYN) IVPB 3.375 g        3.375 g 100 mL/hr over 30 Minutes Intravenous  Once 10/19/2020 1706  11/07/2020 1903        Assessment/Plan POD 20 s/p ex lap with pancreatic debridement for infected necrotic pancreatitis by Dr. Sheliah Hatch on 9/9 - Superior drain under the mesocolon along with the head of the pancreas - Inferior drain under the mesocolon along the left upper abdomen and what is likely a position inferior to the pancreas - CT 9/24 w/ gas and fluid collections that are grossly stable in the retroperitoneum and are drained by JP tubes. Cont drains - CT 9/24 also with mildly increased amount of fluid is noted around the liver with an increased amount of air seen in the non dependent portion concerning for persistent bowel leak. - pt is having bowel function and IR drains placed yesterday do not appear feculent. Will discuss with MD but currently low suspicion for bowel leak. I think we can likely resume post-pyloric tube feeds today  - s/p IR drains 9/26. Follow cx  - BID WTD - Cont therapies, rec CIR who is following - Pulm toilet - Appreciate TRH's and ID's assistance w/ his care   FEN: NPO, TF's held ID: [email or web address] VTE: SCDs, heparin subq. LE [place] neg for DVT's Foley - out, ext cath, voiding    - Per primary -  VDRF - now extubated PNA w/ + resp cx's - cx's w/ stenotrophomonas maltophilia - on abx B/l pleural effusions Abl anemia  ETOH use  AKI - resolved T2DM  LOS: 19 days    Juliet Rude, [facility] Surgery 11/09/2020, 9:28 AM Please see Amion for pager number during day hours 7:00am-4:30pm

## 2020-11-09 NOTE — Progress Notes (Signed)
Wound care performed per orders, JP drains emptied as needed and amount documented.

## 2020-11-09 NOTE — Progress Notes (Addendum)
Occupational Therapy Treatment Patient Details Name: Paul Chambers MRN: [id number] DOB: 1965/07/02 Today's Date: 11/09/2020   History of present illness Pt adm 9/8 with abdominal pain and AMS. Pt found to have extensive pneumoperitoneum and underwent ex lap on 9/9. Pt with infected necrotizing pancreatitis. Pt with acute hypoxic respiratory failure. Pt intubated 9/9-9/12. PMH - etoh, HTN, gout, pancreatitis.   OT comments  Patient sitting on EOB with PT when therapist arrived.  Attempted have patient perform grooming and UE ROM exercises while sitting on eob and patient declined and stated he would attempt when supine. Assisted PT with getting patient supine and positioning in bed. Once in bed patient became more lethargic and unable to participate in more OT. Acute OT to continue to follow.    Recommendations for follow up therapy are one component of a multi-disciplinary discharge planning process, led by the attending physician.  Recommendations may be updated based on patient status, additional functional criteria and insurance authorization.    Follow Up Recommendations  CIR    Equipment Recommendations  3 in 1 bedside commode;Tub/shower seat    Recommendations for Other Services Rehab consult    Precautions / Restrictions Precautions Precautions: Fall Precaution Comments: Several abdominal drains ([employer] R/LLQ, perihepatic drains) Restrictions Weight Bearing Restrictions: No       Mobility Bed Mobility Overal bed mobility: Needs Assistance Bed Mobility: Rolling;Sidelying to Sit;Sit to Sidelying Rolling: Mod assist Sidelying to sit: Mod assist;+2 for safety/equipment;HOB elevated     Sit to sidelying: Mod assist;+2 for safety/equipment General bed mobility comments: Positioned bed to address weight shifing with patient not understanding need.    Transfers Overall transfer level: Needs assistance Equipment used: Standard walker Transfers: Sit to/from Stand Sit to Stand:  Min assist         General transfer comment: from EOB to standard walker, then x3 sidesteps shuffled toward [facility] while pad changed under him; pt defers gait trial (also desat on 8L HF [place] to 79% so defer for medical reasons)    Balance Overall balance assessment: Needs assistance Sitting-balance support: Feet supported;Bilateral upper extremity supported Sitting balance-Leahy Scale: Fair Sitting balance - Comments: Patient performd static sitting on eob with limited UE movement     Standing balance-Leahy Scale: Poor Standing balance comment: reliant on standard walker UE support and min to modA from PTA for 3 sidesteps, pt unable to move walker on his own                           ADL either performed or assessed with clinical judgement   ADL                                         General ADL Comments: Patient did not want to address groomig seated on eob and only asked therapist to wash back.     Vision       Perception     Praxis      Cognition Arousal/Alertness: Lethargic Behavior During Therapy: Flat affect Overall Cognitive Status: Impaired/Different from baseline Area of Impairment: Following commands;Problem solving;Safety/judgement                 Orientation Level: Person;Place     Following Commands: Follows one step commands with increased time Safety/Judgement: Decreased awareness of safety   Problem Solving: Slow processing;Difficulty sequencing;Requires verbal cues;Requires tactile cues;Decreased initiation General  Comments: Patient became more lethargic at end of session with increased confusion        Exercises Exercises: Other exercises General Exercises - Lower Extremity Long Arc Quad: AROM;Right;5 reps;Seated Other Exercises Other Exercises: supine BLE AROM: ankle pumps, heel slides, hip abduction, quad sets x5-10 reps ea   Shoulder Instructions       General Comments SpO2 desat to 79% on 8L HF [place] with  standing at bedside, pt returned to seated and SpO2 remains 85% and less on 8L, increased to 10L HF [place] and SpO2 89-91% at this level. RN notified pt remains on 10L once back to supine; Pt pulling 150-250 on IS x 10 reps    Pertinent Vitals/ Pain       Pain Assessment: 0-10 Pain Score: 10-Worst pain ever Faces Pain Scale: Hurts whole lot Pain Location: abdomen (R>L) Pain Descriptors / Indicators: Sore;Discomfort;Grimacing Pain Intervention(s): Limited activity within patient's tolerance  Home Living                                          Prior Functioning/Environment              Frequency  Min 2X/week        Progress Toward Goals  OT Goals(current goals can now be found in the care plan section)  Progress towards OT goals: Not progressing toward goals - comment (limited particiption for OT due to recent procedures)  Acute Rehab OT Goals Patient Stated Goal: To go to CIR and then home. OT Goal Formulation: With patient Time For Goal Achievement: 11/12/20 Potential to Achieve Goals: Good ADL Goals Pt Will Perform Grooming: with set-up;sitting Pt Will Perform Upper Body Bathing: sitting;with supervision Pt Will Perform Lower Body Bathing: with min assist;sit to/from stand Pt Will Perform Upper Body Dressing: with supervision;sitting Pt Will Perform Lower Body Dressing: with min assist;sit to/from stand Pt Will Transfer to Toilet: with min assist;ambulating;regular height toilet Pt/caregiver will Perform Home Exercise Program: Increased strength;Both right and left upper extremity;With theraband;With Supervision;With written HEP provided  Plan Discharge plan remains appropriate;Frequency remains appropriate    Co-evaluation    PT/OT/SLP Co-Evaluation/Treatment: Yes Reason for Co-Treatment: Complexity of the patient's impairments (multi-system involvement);Necessary to address cognition/behavior during functional activity;For patient/therapist  safety PT goals addressed during session: Mobility/safety with mobility;Balance;Strengthening/ROM OT goals addressed during session: Strengthening/ROM      AM-PAC OT "6 Clicks" Daily Activity     Outcome Measure   Help from another person eating meals?: A Little Help from another person taking care of personal grooming?: A Little Help from another person toileting, which includes using toliet, bedpan, or urinal?: Total Help from another person bathing (including washing, rinsing, drying)?: A Lot Help from another person to put on and taking off regular upper body clothing?: A Little Help from another person to put on and taking off regular lower body clothing?: Total 6 Click Score: 13    End of Session Equipment Utilized During Treatment: Oxygen  OT Visit Diagnosis: Unsteadiness on feet (R26.81);Muscle weakness (generalized) (M62.81);Other symptoms and signs involving cognitive function;Pain Pain - Right/Left:  (middle) Pain - part of body:  (abdomin)   Activity Tolerance Patient tolerated treatment well;Patient limited by lethargy   Patient Left in chair;with call bell/phone within reach;with chair alarm set   Nurse Communication Patient requests pain meds        Time: 6834-1962 OT Time Calculation (  min): 24 min  Charges: OT General Charges $OT Visit: 1 Visit OT Treatments $Therapeutic Activity: 8-22 mins  Alfonse Flavors, OTA   Dewain Penning 11/09/2020, 12:37 PM

## 2020-11-09 NOTE — Progress Notes (Signed)
PROGRESS NOTE  Paul Chambers ZOX:[id number] DOB: 17-Dec-1965   PCP: Pcp, No  Patient is from: Home  DOA: 10/31/2020 LOS: 19  Chief complaints:  Chief Complaint  Patient presents with   Altered Mental Status     Brief Narrative / Interim history: 55 year old M with PMH of DM-2, EtOH abuse, pancreatitis and tobacco use presenting with altered mental status and abdominal pain.  Per wife, started presentation to healthcare facilities ([facility], [place] and finally [facility]) with abdominal pain.  In ED, CTH negative.  CT a/p with pneumoperitoneum without clear sight of perforation. Started on Vanc and Zosyn. He underwent ex lap on 9/9 and found to have large necrotic fluid surrounding pancreas concerning for necrotic infected pancreatitis but no bowel perforation.  He had 2 drains placed.  No fluid culture sent.  He was transferred to ICU postop.  He was extubated on 9/12 to Venturi mask.  Came off sedation on 9/13 and transferred to [facility] service on 9/15.  Hospital course complicated by ARF in the setting of possible aspiration pneumonia, exudative left pleural effusion, ABLA requiring transfusion, AME/delirium and severe malnutrition from prolonged n.p.o. requiring TPN. Eventually had cortrack and started on TF, and weaned off TPN. However, he continued to spike intermittent fever and tachycardia despite antibiotics.  ID consulted and changed antibiotics to IV Zosyn on 9/24. Repeat CT a/p showed perihepatic fluid (new).  IR placed additional 3 drains on 9/26. He remains on IV Zosyn per ID pending cultures.   Subjective: Seen and examined earlier this morning.  No major events overnight of this morning.  He complains hip pain but he points at his abdomen.  Not a great historian.  He denies shortness of breath or chest pain.  He is on a liter by [place] to maintain appropriate saturation.  Poor effort on incentive spirometry.  Patient's wife at bedside.  Objective: Vitals:   11/09/20 0828  11/09/20 0852 11/09/20 1115 11/09/20 1145  BP: 124/74   122/78  Pulse: 97 97  95  Resp: (!) 23 (!) 22  20  Temp: 98.7 F (37.1 C)   98.7 F (37.1 C)  TempSrc: Oral   Oral  SpO2: 92% 92% 92% 95%  Weight:      Height:        Intake/Output Summary (Last 24 hours) at 11/09/2020 1410 Last data filed at 11/09/2020 0940 Gross per 24 hour  Intake 80 ml  Output 1795 ml  Net -1715 ml   Filed Weights   11/03/20 0346 11/04/20 0603 11/08/20 0500  Weight: 98.1 kg 100.2 kg 96.1 kg    Examination:  GENERAL: No apparent distress.  Nontoxic. HEENT: MMM.  Vision and hearing grossly intact.  NECK: Supple.  No apparent JVD.  RESP: 90% on 4 L.  No IWOB.  Diminished aeration bilaterally.  About 500 cc marks on IS CVS:  RRR. Heart sounds normal.  ABD/GI/GU: BS+. Abd soft.  Mild tenderness.  Dressing DCI.  JP drain purulent output MSK/EXT:  Moves extremities. No apparent deformity.  Trace edema bilaterally. SKIN: no apparent skin lesion or wound NEURO: Awake and alert. Oriented appropriately except date.  Follows commands but struggles with executing instruction with incentive spirometry.  No apparent focal neuro deficit. PSYCH: Calm. Normal affect.   GENERAL: No apparent distress.  Nontoxic. HEENT: MMM.  Vision and hearing grossly intact.  NECK: Supple.  No apparent JVD.  RESP: 92% on LAD.  No IWOB.  Diminished aeration bilaterally.  About 350-500 cc on IS CVS:  RRR.  Heart sounds normal.  ABD/GI/GU: BS+.  No significant tenderness.  Drains over RUQ, RLQ and LLQ.  Some with purulence MSK/EXT:  Moves extremities. No apparent deformity.  Trace edema bilaterally. SKIN: no apparent skin lesion or wound NEURO: Awake and alert.  Oriented to self, person, city, state and months.  Follows commands.  No apparent focal neuro deficit. PSYCH: Calm. Normal affect.   Procedures:  9/9-ex lap with drain placement by Dr. Dr. Sheliah Hatch 9/9-9/13-intubation and mechanical ventilation 9/26-drain placement by  IR  Microbiology summarized: 9/8-COVID-19 and influenza PCR nonreactive. 9/9-MRSA PCR screen negative. 9/8 and 9/9-blood cultures NGTD 9/15-sputum culture with STENOTROPHOMONAS MALTOPHILIA sensitive to Levaquin and Bactrim 9/21-blood cultures NGTD. 9/26-abdominal drain culture with abundant GPC's in clusters, rare GNR and rare Enterococcus faecalis  Assessment & Plan: Sepsis due to infected necrotizing pancreatitis with pneumoperitoneum and possible aspiration pneumonia -Manage individual problems as below.    Infected necrotizing pancreatitis with pneumoperitoneum, intra-abdominal and retroperitoneal fluid: -S/p ex-lap and drain placement by Dr. Drexel Iha on 9/9.  Fluid culture was not sent -Repeat CT a/p  on 9/17 with large amount of residual gas and fluid dissecting through the retroperitoneum -Repeat CT a/p over 9/24 with increased perihepatic fluid collection. -9/26-3 additional drains by IR. -9/8 Vanco+Zosyn>9/9>Zyvox+Zosyn>9/14 >meropenem>9/19>Levaquin+Flagyl>9/24 IV Zosyn per ID>> -Surgical wound care per general surgery. -NPO. Continue dextrose 10% infusion to 40 cc an hour and/or surgery decides about TF -IS/OOB/PT/OT   Acute respiratory failure with hypoxia/possible aspiration pneumonia/atelectasis: CXR with hypoinflation and retrocardiac opacity most likely from atelectasis versus infectious process.  Increased oxygen requirement.  Currently on 8 L by HFNC to maintain saturation in low 80s. -ETT postop 9/9-10/2011.  -Aggressive pulmonary toileting, IS, OOB, PT/OT.  -IV Zosyn should cover potential infection.  Anasarca/edema-likely due to malnutrition and hypoalbuminemia.  BNP 160.  TTE on 9/15 basically normal.  Started IV Lasix with IV albumin.  Anasarca seems to be improving. -Continue holding IV Lasix while NPO. -Closely monitor fluid status, renal functions and electrolytes  Acute toxic-metabolic encephalopathy/delirium: Encephalopathy likely from severe sepsis and ICU  delirium.  -Treat treatable causes  -Reorientation and delirium precautions.   -Continue IV Haldol as needed -Fall and aspiration precautions    Ileus: Seems to have resolved.  Diarrhea: Likely from TF.  Low suspicion for C. difficile but is at risk.  Improved.   Acute hepatitis: Likely from TPN.  Resolved.   Alcohol abuse: Out of the window for withdrawal complications.   -Continue thiamine and folate   Acute Blood Loss Anemia, postsurgical: S/p 1 unit of pRBCs on 9/10 with stabilization in hemoglobin since.  Recent Labs    10/28/20 0212 10/29/20 1008 10/30/20 0328 10/31/20 0352 11/02/20 0925 11/04/20 0847 11/05/20 0345 11/06/20 0620 11/08/20 0557 11/09/20 0500  HGB 8.6* 8.6* 8.3* 8.4* 8.6* 7.8* 8.0* 8.5* 8.3* 8.1*  -Continue monitoring  Controlled DM-2 with hyperglycemia: A1c 6.8% on admission. Recent Labs  Lab 11/08/20 1947 11/08/20 2305 11/09/20 0346 11/09/20 0832 11/09/20 1148  GLUCAP 192* 222* 171* 179* 195*  -Continue SSI-high every 4 hours -Start basal insulin at 10 units daily -Decrease dextrose 10% to 40 cc an hour -Further adjustment as appropriate   Hypokalemia/hypomagnesemia/hypophosphatemia: Resolved.   Dysphagia: Cleared by SLP -Remains n.p.o. pending decision by general surgery  Debility/physical deconditioning -Continue PT/OT  Severe protein calorie malnutrition in the setting of n.p.o. as evidenced by anasarca, hypoalbuminemia Body mass index is 31.29 kg/m. Nutrition Problem: Severe Malnutrition Etiology: acute illness (infected necrotic pancreatitis) Signs/Symptoms: moderate fat depletion, moderate muscle depletion,  energy intake < or equal to 50% for > or equal to 5 days, percent weight loss (8.4% weight loss in 1.5 months) Percent weight loss: 8.4 % (1.5 months) Interventions: TPN, Tube feeding, Refer to RD note for recommendations   DVT prophylaxis:  heparin injection 5,000 Units Start: 10/22/20 1400 Place and maintain sequential  compression device Start: 10/19/2020 2202 SCDs Start: 11/06/2020 2145  Code Status: Full code Family Communication: Updated patient's wife at bedside Level of care: Progressive Status is: Inpatient  Remains inpatient appropriate because:Ongoing active pain requiring inpatient pain management, IV treatments appropriate due to intensity of illness or inability to take PO, and Inpatient level of care appropriate due to severity of illness  Dispo: The patient is from: Home              Anticipated d/c is to: CIR              Patient currently is not medically stable to d/c.   Difficult to place patient No       Consultants:  General surgery Infectious disease Interventional radiology   Sch Meds:  Scheduled Meds:  chlorhexidine  15 mL Mouth Rinse BID   Chlorhexidine Gluconate Cloth  6 each Topical Q0600   feeding supplement (PROSource TF)  45 mL Per Tube TID   fluticasone furoate-vilanterol  1 puff Inhalation Daily   And   umeclidinium bromide  1 puff Inhalation Daily   folic acid  1 mg Per Tube Daily   heparin  5,000 Units Subcutaneous Q8H   insulin aspart  0-20 Units Subcutaneous Q4H   mouth rinse  15 mL Mouth Rinse q12n4p   pantoprazole (PROTONIX) IV  40 mg Intravenous Q24H   sodium chloride flush  10-40 mL Intracatheter Q12H   sodium chloride flush  10-40 mL Intracatheter Q12H   thiamine  100 mg Per Tube Daily   Continuous Infusions:  sodium chloride Stopped (10/31/20 0131)   dextrose 40 mL/hr at 11/08/20 1556   feeding supplement (VITAL 1.5 CAL) 1,000 mL (11/06/20 1734)   piperacillin-tazobactam (ZOSYN)  IV 3.375 g (11/09/20 0602)   PRN Meds:.sodium chloride, acetaminophen (TYLENOL) oral liquid 160 mg/5 mL, acetaminophen, albuterol, docusate, fentaNYL, haloperidol lactate, HYDROmorphone (DILAUDID) injection, midazolam, ondansetron (ZOFRAN) IV, polyethylene glycol, sodium chloride flush, sodium chloride flush  Antimicrobials: Anti-infectives (From admission, onward)     Start     Dose/Rate Route Frequency Ordered Stop   11/06/20 1545  piperacillin-tazobactam (ZOSYN) IVPB 3.375 g        3.375 g 12.5 mL/hr over 240 Minutes Intravenous Every 8 hours 11/06/20 1453     11/01/20 0900  levofloxacin (LEVAQUIN) IVPB 750 mg  Status:  Discontinued        750 mg 100 mL/hr over 90 Minutes Intravenous Every 24 hours 11/01/20 0824 11/06/20 1408   11/01/20 0900  metroNIDAZOLE (FLAGYL) IVPB 500 mg  Status:  Discontinued        500 mg 100 mL/hr over 60 Minutes Intravenous Every 12 hours 11/01/20 0824 11/06/20 1408   10/29/20 2200  meropenem (MERREM) 1 g in sodium chloride 0.9 % 100 mL IVPB  Status:  Discontinued        1 g 200 mL/hr over 30 Minutes Intravenous Every 8 hours 10/29/20 1428 11/01/20 0824   10/28/20 1615  meropenem (MERREM) 2 g in sodium chloride 0.9 % 100 mL IVPB  Status:  Discontinued        2 g 200 mL/hr over 30 Minutes Intravenous Every 8 hours 10/28/20  1515 10/29/20 1428   10/28/20 1445  metroNIDAZOLE (FLAGYL) IVPB 500 mg  Status:  Discontinued        500 mg 100 mL/hr over 60 Minutes Intravenous Every 12 hours 10/28/20 1348 10/28/20 1505   10/25/20 2300  fluconazole (DIFLUCAN) IVPB 400 mg        400 mg 100 mL/hr over 120 Minutes Intravenous Every 24 hours 10/25/20 0742 10/29/20 0718   10/24/20 2300  fluconazole (DIFLUCAN) IVPB 200 mg  Status:  Discontinued        200 mg 100 mL/hr over 60 Minutes Intravenous Every 24 hours 10/24/20 0943 10/25/20 0742   10/22/20 1800  vancomycin (VANCOREADY) IVPB 1500 mg/300 mL  Status:  Discontinued        1,500 mg 150 mL/hr over 120 Minutes Intravenous Every 24 hours 10/24/2020 2225 10/22/20 0759   10/22/20 0900  linezolid (ZYVOX) IVPB 600 mg  Status:  Discontinued        600 mg 300 mL/hr over 60 Minutes Intravenous Every 12 hours 10/22/20 0800 10/27/20 0852   10/22/20 0300  piperacillin-tazobactam (ZOSYN) IVPB 3.375 g  Status:  Discontinued        3.375 g 12.5 mL/hr over 240 Minutes Intravenous Every 8 hours  11/05/2020 2225 10/28/20 1514   11/09/2020 2335  metroNIDAZOLE (FLAGYL) IVPB 500 mg  Status:  Discontinued        500 mg 100 mL/hr over 60 Minutes Intravenous Every 12 hours 10/14/2020 2142 10/22/20 0747   10/20/2020 2248  fluconazole (DIFLUCAN) IVPB 400 mg  Status:  Discontinued        400 mg 100 mL/hr over 120 Minutes Intravenous Every 24 hours 10/31/2020 2142 10/24/20 0943   10/25/2020 1748  vancomycin (VANCOREADY) IVPB 2000 mg/400 mL        2,000 mg 200 mL/hr over 120 Minutes Intravenous  Once 10/30/2020 1706 11/09/2020 2113   10/28/2020 1715  piperacillin-tazobactam (ZOSYN) IVPB 3.375 g        3.375 g 100 mL/hr over 30 Minutes Intravenous  Once 10/27/2020 1706 10/28/2020 1903        I have personally reviewed the following labs and images: CBC: Recent Labs  Lab 11/04/20 0847 11/05/20 0345 11/06/20 0620 11/08/20 0557 11/09/20 0500  WBC 8.2 8.6 10.7* 13.0* 13.4*  NEUTROABS 6.4  --  8.0*  --   --   HGB 7.8* 8.0* 8.5* 8.3* 8.1*  HCT 25.1* 25.0* 27.1* 25.9* 25.8*  MCV 104.6* 104.2* 105.4* 100.4* 102.4*  PLT 191 215 235 326 350   BMP &GFR Recent Labs  Lab 11/04/20 0847 11/06/20 0617 11/07/20 0440 11/08/20 0557 11/09/20 0450 11/09/20 0500  NA 137 139 136 135 133*  --   K 4.0 3.8 3.9 4.0 3.5  --   CL 104 105 100 97* 96*  --   CO2 27 27 29 30 30   --   GLUCOSE 236* 215* 155* 172* 180*  --   BUN 16 17 16 16 13   --   CREATININE 0.58* 0.51* 0.65 0.70 0.61  --   CALCIUM 8.2* 7.9* 8.1* 7.9* 7.9*  --   MG 1.9  --  1.8 2.0  --  1.9  PHOS 3.1 2.9 3.2 4.0 3.2  --    Estimated Creatinine Clearance: 123.6 mL/min (by C-G formula based on SCr of 0.61 mg/dL). Liver & Pancreas: Recent Labs  Lab 11/04/20 0847 11/06/20 0617 11/06/20 0618 11/07/20 0440 11/08/20 0557 11/09/20 0450  AST 23  --  26 24 20   --  ALT 17  --  16 15 14   --   ALKPHOS 76  --  89 82 76  --   BILITOT 0.5  --  0.6 0.7 0.8  --   PROT 5.6*  --  5.9* 5.9* 6.3*  --   ALBUMIN 1.4* 1.3* 1.3* 1.9* 1.7* 1.6*   No results for  input(s): LIPASE, AMYLASE in the last 168 hours. Recent Labs  Lab 11/07/20 0440  AMMONIA 38*   Diabetic: No results for input(s): HGBA1C in the last 72 hours. Recent Labs  Lab 11/08/20 1947 11/08/20 2305 11/09/20 0346 11/09/20 0832 11/09/20 1148  GLUCAP 192* 222* 171* 179* 195*   Cardiac Enzymes: Recent Labs  Lab 11/07/20 0440  CKTOTAL 10*   No results for input(s): PROBNP in the last 8760 hours. Coagulation Profile: No results for input(s): INR, PROTIME in the last 168 hours. Thyroid Function Tests: Recent Labs    11/07/20 0440  TSH 8.574*   Lipid Profile: Recent Labs    11/08/20 0557  TRIG 101   Anemia Panel: No results for input(s): VITAMINB12, FOLATE, FERRITIN, TIBC, IRON, RETICCTPCT in the last 72 hours. Urine analysis:    Component Value Date/Time   COLORURINE AMBER (A) 10/17/2020 1549   APPEARANCEUR CLEAR 11/01/2020 1549   LABSPEC 1.015 10/18/2020 1549   PHURINE 6.0 11/10/2020 1549   GLUCOSEU NEGATIVE 10/15/2020 1549   HGBUR TRACE (A) 10/23/2020 1549   BILIRUBINUR SMALL (A) 10/19/2020 1549   KETONESUR 15 (A) 11/09/2020 1549   PROTEINUR NEGATIVE 10/20/2020 1549   NITRITE NEGATIVE 10/28/2020 1549   LEUKOCYTESUR NEGATIVE 10/23/2020 1549   Sepsis Labs: Invalid input(s): PROCALCITONIN, LACTICIDVEN  Microbiology: Recent Results (from the past 240 hour(s))  Culture, blood (Routine X 2) w Reflex to ID Panel     Status: None   Collection Time: 11/03/20 11:23 AM   Specimen: BLOOD  Result Value Ref Range Status   Specimen Description BLOOD LEFT ANTECUBITAL  Final   Special Requests   Final    BOTTLES DRAWN AEROBIC ONLY Blood Culture adequate volume   Culture   Final    NO GROWTH 5 DAYS Performed at [facility], [address], [place], [place]    Report Status 11/08/2020 FINAL  Final  Culture, blood (Routine X 2) w Reflex to ID Panel     Status: None   Collection Time: 11/03/20 11:24 AM   Specimen: BLOOD LEFT HAND  Result Value  Ref Range Status   Specimen Description BLOOD LEFT HAND  Final   Special Requests   Final    BOTTLES DRAWN AEROBIC ONLY Blood Culture adequate volume   Culture   Final    NO GROWTH 5 DAYS Performed at [facility], [address], [place], [place]    Report Status 11/08/2020 FINAL  Final  Culture, blood (routine x 2)     Status: None (Preliminary result)   Collection Time: 11/07/20 11:45 AM   Specimen: BLOOD LEFT ARM  Result Value Ref Range Status   Specimen Description BLOOD LEFT ARM  Final   Special Requests   Final    BOTTLES DRAWN AEROBIC AND ANAEROBIC Blood Culture adequate volume   Culture   Final    NO GROWTH 1 DAY Performed at [facility], [address] [place], [place]    Report Status PENDING  Incomplete  Culture, blood (routine x 2)     Status: None (Preliminary result)   Collection Time: 11/07/20  11:51 AM   Specimen: BLOOD LEFT HAND  Result Value Ref Range Status   Specimen Description BLOOD LEFT HAND  Final   Special Requests   Final    BOTTLES DRAWN AEROBIC ONLY Blood Culture adequate volume   Culture   Final    NO GROWTH 1 DAY Performed at [facility], [address], [place], [place] [address]    Report Status PENDING  Incomplete  Aerobic/Anaerobic Culture w Gram Stain (surgical/deep wound)     Status: None (Preliminary result)   Collection Time: 11/08/20  1:47 PM   Specimen: Wound  Result Value Ref Range Status   Specimen Description WOUND LEFT ABDOMEN  Final   Special Requests CT DRAIN  Final   Gram Stain   Final    NO ORGANISMS SEEN SQUAMOUS EPITHELIAL CELLS PRESENT MODERATE WBC PRESENT,BOTH PMN AND MONONUCLEAR ABUNDANT GRAM POSITIVE COCCI IN CLUSTERS RARE GRAM NEGATIVE RODS Performed at [facility], [address], [place], [place] [address]    Culture RARE ENTEROCOCCUS FAECALIS  Final   Report Status PENDING  Incomplete    Radiology Studies: No results found.   Ahmari Garton [facility]ist  If 7PM-7AM, please contact night-coverage [email or web address] 11/09/2020, 2:10 PM

## 2020-11-09 NOTE — Progress Notes (Signed)
Physical Therapy Treatment Patient Details Name: Paul Chambers MRN: [id number] DOB: 1965-12-27 Today's Date: 11/09/2020   History of Present Illness Pt adm 9/8 with abdominal pain and AMS. Pt found to have extensive pneumoperitoneum and underwent ex lap on 9/9. Pt with infected necrotizing pancreatitis. Pt with acute hypoxic respiratory failure. Pt intubated 9/9-9/12. PMH - etoh, HTN, gout, pancreatitis.    PT Comments    Pt received in supine, drowsy (per RN pt had dilaudid recently) but agreeable to therapy session and able to mobilize to/from EOB with increased time/effort and up to +2 modA. Pt able to stand from EOB briefly to have pad changed under him and perform sidesteps along EOB with up to modA. Pt able to follow instructions for some supine LE exercises and seated exercises but quick to fatigue and desat to 79% on 8L HF[place], pt titrated to 10L HF [place] and remains 89-91% at rest on this level, RN notified and entering room to assess pt after session. Pt continues to benefit from PT services to progress toward functional mobility goals. Continue to recommend CIR as pt remains below functional baseline and is making progress toward goals this session.  Recommendations for follow up therapy are one component of a multi-disciplinary discharge planning process, led by the attending physician.  Recommendations may be updated based on patient status, additional functional criteria and insurance authorization.  Follow Up Recommendations  CIR     Equipment Recommendations  Rolling walker with 5" wheels    Recommendations for Other Services Rehab consult     Precautions / Restrictions Precautions Precautions: Fall Precaution Comments: Several abdominal drains ([employer] R/LLQ, perihepatic drains) Restrictions Weight Bearing Restrictions: No     Mobility  Bed Mobility Overal bed mobility: Needs Assistance Bed Mobility: Rolling;Sidelying to Sit;Sit to Sidelying Rolling: Mod assist Sidelying  to sit: Mod assist;+2 for safety/equipment;HOB elevated     Sit to sidelying: Mod assist;+2 for safety/equipment General bed mobility comments: HOB initially raised, and pt with increased pain to lower head down for repositioning in the bed. With increased time, pt agreeable to rolling L/R and needs max 1-step cues for bed mobility and at times still does not follow instruction as stated by therapist (unsure if due to drowsiness/meds vs confusion). Follows ~75% of simple cues.    Transfers Overall transfer level: Needs assistance Equipment used: Standard walker Transfers: Sit to/from Stand Sit to Stand: Min assist to ModA  Side steps: Mod assist       General transfer comment: from EOB to standard walker, then x3 sidesteps shuffled toward [facility] while pad changed under him; pt defers gait trial (also desat on 8L HF [place] to 79% so defer for medical reasons)  Ambulation/Gait  General Gait Details: unsafe to assess, pt desat with sidesteps along EOB tp 79% and needed 10L to return to 90% SpO2        Balance Overall balance assessment: Needs assistance Sitting-balance support: Feet supported;Bilateral upper extremity supported Sitting balance-Leahy Scale: Fair Sitting balance - Comments: pt tolerated sitting ~15 mins EOB with Supervision and 1-2 UE support, pt unwilling to do much weight shifting or reaching while seated due to fatigue/pain     Standing balance-Leahy Scale: Poor Standing balance comment: reliant on standard walker UE support and min to modA from PTA for 3 sidesteps, pt unable to move walker on his own  Cognition Arousal/Alertness: Lethargic Behavior During Therapy: Flat affect Overall Cognitive Status: Impaired/Different from baseline Area of Impairment: Following commands;Problem solving;Safety/judgement                 Orientation Level:  (pt able to state name/day of week, not otherwise assessed)     Following  Commands: Follows one step commands with increased time Safety/Judgement: Decreased awareness of safety   Problem Solving: Slow processing;Difficulty sequencing;Requires verbal cues;Requires tactile cues;Decreased initiation General Comments: pt having trouble understanding reasoning behind sequencing for bed mobility/decreased pain and very drowsy, tending to keep eyes closed between mobility cues; slightly more alert while seated EOB; per pt/spouse he hadn't been OOB/sitting up in a week.      Exercises General Exercises - Lower Extremity Long Arc Quad: AROM;Right;5 reps;Seated Other Exercises Other Exercises: supine BLE AROM: ankle pumps, heel slides, hip abduction, quad sets x5-10 reps ea    General Comments General comments (skin integrity, edema, etc.): SpO2 desat to 79% on 8L HF [place] with standing at bedside, pt returned to seated and SpO2 remains 85% and less on 8L, increased to 10L HF [place] and SpO2 89-91% at this level. RN notified pt remains on 10L once back to supine; Pt pulling 150-250 on IS x 10 reps      Pertinent Vitals/Pain Pain Assessment: 0-10 Pain Score: 10-Worst pain ever Faces Pain Scale: Hurts whole lot Pain Location: abdomen (R>L) Pain Descriptors / Indicators: Sore;Discomfort;Grimacing Pain Intervention(s): Limited activity within patient's tolerance;Premedicated before session;Monitored during session;Repositioned;Other (comment) (pt had dialudid prior to PT/OT session)           PT Goals (current goals can now be found in the care plan section) Acute Rehab PT Goals Patient Stated Goal: To go to CIR and then home. PT Goal Formulation: With patient Time For Goal Achievement: 11/12/20 Progress towards PT goals: Progressing toward goals    Frequency    Min 3X/week      PT Plan Current plan remains appropriate    Co-evaluation PT/OT/SLP Co-Evaluation/Treatment: Yes Reason for Co-Treatment: Complexity of the patient's impairments (multi-system  involvement);Necessary to address cognition/behavior during functional activity;For patient/therapist safety;To address functional/ADL transfers PT goals addressed during session: Mobility/safety with mobility;Balance;Strengthening/ROM        AM-PAC PT "6 Clicks" Mobility   Outcome Measure  Help needed turning from your back to your side while in a flat bed without using bedrails?: A Lot Help needed moving from lying on your back to sitting on the side of a flat bed without using bedrails?: A Lot Help needed moving to and from a bed to a chair (including a wheelchair)?: A Lot Help needed standing up from a chair using your arms (e.g., wheelchair or bedside chair)?: A Lot Help needed to walk in hospital room?: Total Help needed climbing 3-5 steps with a railing? : Total 6 Click Score: 10    End of Session Equipment Utilized During Treatment: Oxygen (HF [place]) Activity Tolerance: Patient tolerated treatment well;Patient limited by lethargy Patient left: in bed;with call bell/phone within reach;with bed alarm set;with family/visitor present;with SCD's reapplied;Other (comment) (semi-sidelying to R side) Nurse Communication: Mobility status;Other (comment);Patient requests pain meds (SpO2 desat on 8L with mobility, pt left on 10L HF [place]) PT Visit Diagnosis: Unsteadiness on feet (R26.81);Other abnormalities of gait and mobility (R26.89);Muscle weakness (generalized) (M62.81)     Time: 4270-6237 PT Time Calculation (min) (ACUTE ONLY): 46 min  Charges:  $Therapeutic Exercise: 8-22 mins $Therapeutic Activity: 8-22 mins  Florina Ou., PTA Acute Rehabilitation Services Pager: [phone] Office: [phone]    Angus Palms 11/09/2020, 11:31 AM

## 2020-11-09 NOTE — Plan of Care (Signed)

## 2020-11-09 NOTE — Progress Notes (Signed)
SLP Cancellation Note  Patient Details Name: Paul Chambers MRN: [id number] DOB: Sep 08, 1965   Cancelled treatment:       Reason Eval/Treat Not Completed: Per MD note (9/27), pt to remain NPO pending decision by surgery. SLP to f/u on subsequent date. Discussed with RN.    Avie Echevaria, MA, CCC-SLP Acute Rehabilitation Services Office Number: [phone]  [name] 11/09/2020, 2:46 PM

## 2020-11-09 NOTE — Progress Notes (Signed)
ID PROGRESS NOTE  Patient having less diarrhea. Afebrile, drain cx showing enterococcus   A/P: will change IV Abtx from piptazo -> amp/sub to cover enterococcus plus GNR/anaerobes  Vivek Grealish B. Drue Second MD MPH [facility] [phone]

## 2020-11-10 DIAGNOSIS — A419 Sepsis, unspecified organism: Secondary | ICD-10-CM | POA: Diagnosis not present

## 2020-11-10 DIAGNOSIS — E8809 Other disorders of plasma-protein metabolism, not elsewhere classified: Secondary | ICD-10-CM | POA: Diagnosis not present

## 2020-11-10 DIAGNOSIS — K8591 Acute pancreatitis with uninfected necrosis, unspecified: Secondary | ICD-10-CM | POA: Diagnosis not present

## 2020-11-10 DIAGNOSIS — D539 Nutritional anemia, unspecified: Secondary | ICD-10-CM | POA: Diagnosis not present

## 2020-11-10 LAB — GLUCOSE, CAPILLARY
Glucose-Capillary: 126 mg/dL — ABNORMAL HIGH (ref 70–99)
Glucose-Capillary: 135 mg/dL — ABNORMAL HIGH (ref 70–99)
Glucose-Capillary: 136 mg/dL — ABNORMAL HIGH (ref 70–99)
Glucose-Capillary: 144 mg/dL — ABNORMAL HIGH (ref 70–99)
Glucose-Capillary: 146 mg/dL — ABNORMAL HIGH (ref 70–99)
Glucose-Capillary: 150 mg/dL — ABNORMAL HIGH (ref 70–99)
Glucose-Capillary: 187 mg/dL — ABNORMAL HIGH (ref 70–99)

## 2020-11-10 LAB — RENAL FUNCTION PANEL
Albumin: 1.5 g/dL — ABNORMAL LOW (ref 3.5–5.0)
Anion gap: 4 — ABNORMAL LOW (ref 5–15)
BUN: 9 mg/dL (ref 6–20)
CO2: 30 mmol/L (ref 22–32)
Calcium: 7.8 mg/dL — ABNORMAL LOW (ref 8.9–10.3)
Chloride: 96 mmol/L — ABNORMAL LOW (ref 98–111)
Creatinine, Ser: 0.52 mg/dL — ABNORMAL LOW (ref 0.61–1.24)
GFR, Estimated: >60 mL/min (ref >60)
Glucose, Bld: 147 mg/dL — ABNORMAL HIGH (ref 70–99)
Phosphorus: 2.7 mg/dL (ref 2.5–4.6)
Potassium: 3.6 mmol/L (ref 3.5–5.1)
Sodium: 130 mmol/L — ABNORMAL LOW (ref 135–145)

## 2020-11-10 LAB — CBC
HCT: 26 % — ABNORMAL LOW (ref 39.0–52.0)
Hemoglobin: 8 g/dL — ABNORMAL LOW (ref 13.0–17.0)
MCH: 31.6 pg (ref 26.0–34.0)
MCHC: 30.8 g/dL (ref 30.0–36.0)
MCV: 102.8 fL — ABNORMAL HIGH (ref 80.0–100.0)
Platelets: 366 10*3/uL (ref 150–400)
RBC: 2.53 MIL/uL — ABNORMAL LOW (ref 4.22–5.81)
RDW: 18.2 % — ABNORMAL HIGH (ref 11.5–15.5)
WBC: 15.3 10*3/uL — ABNORMAL HIGH (ref 4.0–10.5)
nRBC: 0.5 % — ABNORMAL HIGH (ref 0.0–0.2)

## 2020-11-10 LAB — MAGNESIUM: Magnesium: 2 mg/dL (ref 1.7–2.4)

## 2020-11-10 NOTE — Progress Notes (Signed)
Progress Note  20 Days Post-Op  Subjective: Patient reports abdominal pain. Denies nausea. Discussed with night and day shift RNs importance of documenting drain output and they are in agreement. Discussed resumption of tube feeds today.   Objective: Vital signs in last 24 hours: Temp:  [98.7 F (37.1 C)-100 F (37.8 C)] 98.7 F (37.1 C) (09/28 0347) Pulse Rate:  [95-108] 102 (09/28 0844) Resp:  [20] 20 (09/28 0844) BP: (122-133)/(78-81) 123/78 (09/28 0347) SpO2:  [90 %-95 %] 90 % (09/28 0844) Last BM Date: 11/09/20  Intake/Output from previous day: 09/27 0701 - 09/28 0700 In: 20 [I.V.:20] Out: 1065 [Urine:750; Emesis/NG output:120; Drains:195] Intake/Output this shift: No intake/output data recorded.  PE: Gen:  Awake and alert, lying in bed Heart: RRR Pulm: normal effort on nasal cannula Abd: Distended but soft, diffusely TTP without rigidity or guarding.  RUQ surgical JP drains x2 with tan-grey thick drainage RUQ IR drain SS  RLQ IR drain purulent LLQ IR drain more serous today  Midline wound pale pink, clean without dehiscence Laparoscopic sites c/d/i   Lab Results:  Recent Labs    11/09/20 0500 11/10/20 0430  WBC 13.4* 15.3*  HGB 8.1* 8.0*  HCT 25.8* 26.0*  PLT 350 366   BMET Recent Labs    11/09/20 0450 11/10/20 0430  NA 133* 130*  K 3.5 3.6  CL 96* 96*  CO2 30 30  GLUCOSE 180* 147*  BUN 13 9  CREATININE 0.61 0.52*  CALCIUM 7.9* 7.8*   PT/INR No results for input(s): LABPROT, INR in the last 72 hours. CMP     Component Value Date/Time   NA 130 (L) 11/10/2020 0430   K 3.6 11/10/2020 0430   CL 96 (L) 11/10/2020 0430   CO2 30 11/10/2020 0430   GLUCOSE 147 (H) 11/10/2020 0430   BUN 9 11/10/2020 0430   CREATININE 0.52 (L) 11/10/2020 0430   CALCIUM 7.8 (L) 11/10/2020 0430   PROT 6.3 (L) 11/08/2020 0557   ALBUMIN 1.5 (L) 11/10/2020 0430   AST 20 11/08/2020 0557   ALT 14 11/08/2020 0557   ALKPHOS 76 11/08/2020 0557   BILITOT 0.8  11/08/2020 0557   GFRNONAA >60 11/10/2020 0430   Lipase     Component Value Date/Time   LIPASE 25 11/09/2020 1506       Studies/Results: CT ASPIRATION  Result Date: 11/08/2020 INDICATION: History of necrotizing pancreatitis, now with multiple complex fluid collections within the abdomen and pelvis despite operative debridement and placement of two large bore surgical peripancreatic drainage catheters. Request made for CT-guided aspiration(s) versus drainage catheter(s) placement for infection source control purposes. EXAM: 1. CT-GUIDED PERCUTANEOUS DRAINAGE CATHETER PLACEMENT X3 2. CT-GUIDED ASPIRATION OF LEFT LOWER ABDOMINAL/PELVIC FLUID COLLECTION COMPARISON:  CT abdomen pelvis-11/06/2020; 10/29/2020; 10/31/2020 MEDICATIONS: The patient is currently admitted to the hospital and receiving intravenous antibiotics. The antibiotics were administered within an appropriate time frame prior to the initiation of the procedure. ANESTHESIA/SEDATION: Moderate (conscious) sedation was employed during this procedure. A total of Versed 1 mg and Fentanyl 25 mcg was administered intravenously. Moderate Sedation Time: 65 minutes. The patient's level of consciousness and vital signs were monitored continuously by radiology nursing throughout the procedure under my direct supervision. CONTRAST:  None COMPLICATIONS: None immediate. PROCEDURE: Informed written consent was obtained from the patient after a discussion of the risks, benefits and alternatives to treatment. The patient was placed supine on the CT gantry and a pre procedural CT was performed re-demonstrating the known abscess/fluid collection within the perihepatic  space with dominant mixed air and fluid containing collection measuring at least 18.0 x 4.6 cm (image 20, series 2), the approximately 11.5 x 5.3 cm complex fluid collection within the left mid hemiabdomen (image 49, series 2), the approximately 9.4 x 2.6 cm complex fluid collection within the  midline of the right mid abdomen at the level of the mesenteric root (image 64, series 2 as well as the ill-defined approximately 3.2 x 2.6 cm fluid collection within the left hemipelvis (image 94, series 3). The procedure was planned. A timeout was performed prior to the initiation of the procedure. The skin overlying the operative sites were prepped and draped in usual sterile fashion. The overlying soft tissues were anesthetized with 1% lidocaine with epinephrine. Each collection was targeted with an 18 gauge trocar needle. If purulent fluid was aspirated, then a short Amplatz wire was coiled within each collection. Ultimately each collection except the collection within the left hemipelvis was successfully cannulated with a short Amplatz wire allowing placement of 14 French drainage catheters at all locations. The collection within the left lower pelvis was not amenable to percutaneous drainage catheter placement secondary to inability to coil a wire within the collection however proximally 5 cc of bloody fluid was aspirated from this indeterminate fluid collection. The collection within the perihepatic space yielded 100 cc of blood tinged fluid. The collection within the right lower abdominal quadrant yielded 50 cc of purulent fluid while the collection within the left mid abdomen yielded 125 cc of purulent fluid. A representative aspirated sample from the collection with the left mid hemiabdomen was capped and sent to the laboratory for analysis All drainage catheters were flushed with a small amount of saline and all drainage catheters were connected to gravity bags and secured in place with interrupted sutures and StatLock devices. Dressings were applied. While uncomfortable lying supine on the CT gantry, the patient tolerated the procedure well without immediate postprocedural complication. IMPRESSION: 1. Successful CT-guided placement of a 14 French drainage catheter within the complex collection within  the left mid hemiabdomen yielding 125 cc of purulent fluid. A representative sample of aspirated fluid from this collection was capped and sent to the laboratory for analysis. 2. Successful CT-guided placement of 14 French perihepatic drainage catheter yielding 100 cc of blood tinged fluid. 3. Successful CT-guided placement of a 14 French drainage catheter within the complex fluid collection within the right lower abdomen at the level of the mesenteric root yielding 50 cc of purulent fluid. 4. Successful CT-guided aspiration of approximately 5 cc of blood tinged fluid from the indeterminate fluid collection within the left hemipelvis, not amenable to drainage catheter placement given inability to coil a wire within this poorly defined collection/hematoma. Electronically Signed   By: Simonne Come M.D.   On: 11/08/2020 14:42   CT IMAGE GUIDED DRAINAGE BY PERCUTANEOUS CATHETER  Result Date: 11/08/2020 INDICATION: History of necrotizing pancreatitis, now with multiple complex fluid collections within the abdomen and pelvis despite operative debridement and placement of two large bore surgical peripancreatic drainage catheters. Request made for CT-guided aspiration(s) versus drainage catheter(s) placement for infection source control purposes. EXAM: 1. CT-GUIDED PERCUTANEOUS DRAINAGE CATHETER PLACEMENT X3 2. CT-GUIDED ASPIRATION OF LEFT LOWER ABDOMINAL/PELVIC FLUID COLLECTION COMPARISON:  CT abdomen pelvis-11/06/2020; 10/29/2020; 10/24/2020 MEDICATIONS: The patient is currently admitted to the hospital and receiving intravenous antibiotics. The antibiotics were administered within an appropriate time frame prior to the initiation of the procedure. ANESTHESIA/SEDATION: Moderate (conscious) sedation was employed during this procedure. A total of  Versed 1 mg and Fentanyl 25 mcg was administered intravenously. Moderate Sedation Time: 65 minutes. The patient's level of consciousness and vital signs were monitored  continuously by radiology nursing throughout the procedure under my direct supervision. CONTRAST:  None COMPLICATIONS: None immediate. PROCEDURE: Informed written consent was obtained from the patient after a discussion of the risks, benefits and alternatives to treatment. The patient was placed supine on the CT gantry and a pre procedural CT was performed re-demonstrating the known abscess/fluid collection within the perihepatic space with dominant mixed air and fluid containing collection measuring at least 18.0 x 4.6 cm (image 20, series 2), the approximately 11.5 x 5.3 cm complex fluid collection within the left mid hemiabdomen (image 49, series 2), the approximately 9.4 x 2.6 cm complex fluid collection within the midline of the right mid abdomen at the level of the mesenteric root (image 64, series 2 as well as the ill-defined approximately 3.2 x 2.6 cm fluid collection within the left hemipelvis (image 94, series 3). The procedure was planned. A timeout was performed prior to the initiation of the procedure. The skin overlying the operative sites were prepped and draped in usual sterile fashion. The overlying soft tissues were anesthetized with 1% lidocaine with epinephrine. Each collection was targeted with an 18 gauge trocar needle. If purulent fluid was aspirated, then a short Amplatz wire was coiled within each collection. Ultimately each collection except the collection within the left hemipelvis was successfully cannulated with a short Amplatz wire allowing placement of 14 French drainage catheters at all locations. The collection within the left lower pelvis was not amenable to percutaneous drainage catheter placement secondary to inability to coil a wire within the collection however proximally 5 cc of bloody fluid was aspirated from this indeterminate fluid collection. The collection within the perihepatic space yielded 100 cc of blood tinged fluid. The collection within the right lower abdominal  quadrant yielded 50 cc of purulent fluid while the collection within the left mid abdomen yielded 125 cc of purulent fluid. A representative aspirated sample from the collection with the left mid hemiabdomen was capped and sent to the laboratory for analysis All drainage catheters were flushed with a small amount of saline and all drainage catheters were connected to gravity bags and secured in place with interrupted sutures and StatLock devices. Dressings were applied. While uncomfortable lying supine on the CT gantry, the patient tolerated the procedure well without immediate postprocedural complication. IMPRESSION: 1. Successful CT-guided placement of a 14 French drainage catheter within the complex collection within the left mid hemiabdomen yielding 125 cc of purulent fluid. A representative sample of aspirated fluid from this collection was capped and sent to the laboratory for analysis. 2. Successful CT-guided placement of 14 French perihepatic drainage catheter yielding 100 cc of blood tinged fluid. 3. Successful CT-guided placement of a 14 French drainage catheter within the complex fluid collection within the right lower abdomen at the level of the mesenteric root yielding 50 cc of purulent fluid. 4. Successful CT-guided aspiration of approximately 5 cc of blood tinged fluid from the indeterminate fluid collection within the left hemipelvis, not amenable to drainage catheter placement given inability to coil a wire within this poorly defined collection/hematoma. Electronically Signed   By: Simonne Come M.D.   On: 11/08/2020 14:42   CT IMAGE GUIDED DRAINAGE BY PERCUTANEOUS CATHETER  Result Date: 11/08/2020 INDICATION: History of necrotizing pancreatitis, now with multiple complex fluid collections within the abdomen and pelvis despite operative debridement and placement of  two large bore surgical peripancreatic drainage catheters. Request made for CT-guided aspiration(s) versus drainage catheter(s)  placement for infection source control purposes. EXAM: 1. CT-GUIDED PERCUTANEOUS DRAINAGE CATHETER PLACEMENT X3 2. CT-GUIDED ASPIRATION OF LEFT LOWER ABDOMINAL/PELVIC FLUID COLLECTION COMPARISON:  CT abdomen pelvis-11/06/2020; 10/29/2020; 10/22/2020 MEDICATIONS: The patient is currently admitted to the hospital and receiving intravenous antibiotics. The antibiotics were administered within an appropriate time frame prior to the initiation of the procedure. ANESTHESIA/SEDATION: Moderate (conscious) sedation was employed during this procedure. A total of Versed 1 mg and Fentanyl 25 mcg was administered intravenously. Moderate Sedation Time: 65 minutes. The patient's level of consciousness and vital signs were monitored continuously by radiology nursing throughout the procedure under my direct supervision. CONTRAST:  None COMPLICATIONS: None immediate. PROCEDURE: Informed written consent was obtained from the patient after a discussion of the risks, benefits and alternatives to treatment. The patient was placed supine on the CT gantry and a pre procedural CT was performed re-demonstrating the known abscess/fluid collection within the perihepatic space with dominant mixed air and fluid containing collection measuring at least 18.0 x 4.6 cm (image 20, series 2), the approximately 11.5 x 5.3 cm complex fluid collection within the left mid hemiabdomen (image 49, series 2), the approximately 9.4 x 2.6 cm complex fluid collection within the midline of the right mid abdomen at the level of the mesenteric root (image 64, series 2 as well as the ill-defined approximately 3.2 x 2.6 cm fluid collection within the left hemipelvis (image 94, series 3). The procedure was planned. A timeout was performed prior to the initiation of the procedure. The skin overlying the operative sites were prepped and draped in usual sterile fashion. The overlying soft tissues were anesthetized with 1% lidocaine with epinephrine. Each collection was  targeted with an 18 gauge trocar needle. If purulent fluid was aspirated, then a short Amplatz wire was coiled within each collection. Ultimately each collection except the collection within the left hemipelvis was successfully cannulated with a short Amplatz wire allowing placement of 14 French drainage catheters at all locations. The collection within the left lower pelvis was not amenable to percutaneous drainage catheter placement secondary to inability to coil a wire within the collection however proximally 5 cc of bloody fluid was aspirated from this indeterminate fluid collection. The collection within the perihepatic space yielded 100 cc of blood tinged fluid. The collection within the right lower abdominal quadrant yielded 50 cc of purulent fluid while the collection within the left mid abdomen yielded 125 cc of purulent fluid. A representative aspirated sample from the collection with the left mid hemiabdomen was capped and sent to the laboratory for analysis All drainage catheters were flushed with a small amount of saline and all drainage catheters were connected to gravity bags and secured in place with interrupted sutures and StatLock devices. Dressings were applied. While uncomfortable lying supine on the CT gantry, the patient tolerated the procedure well without immediate postprocedural complication. IMPRESSION: 1. Successful CT-guided placement of a 14 French drainage catheter within the complex collection within the left mid hemiabdomen yielding 125 cc of purulent fluid. A representative sample of aspirated fluid from this collection was capped and sent to the laboratory for analysis. 2. Successful CT-guided placement of 14 French perihepatic drainage catheter yielding 100 cc of blood tinged fluid. 3. Successful CT-guided placement of a 14 French drainage catheter within the complex fluid collection within the right lower abdomen at the level of the mesenteric root yielding 50 cc of purulent  fluid.  4. Successful CT-guided aspiration of approximately 5 cc of blood tinged fluid from the indeterminate fluid collection within the left hemipelvis, not amenable to drainage catheter placement given inability to coil a wire within this poorly defined collection/hematoma. Electronically Signed   By: Simonne Come M.D.   On: 11/08/2020 14:42   CT IMAGE GUIDED DRAINAGE BY PERCUTANEOUS CATHETER  Result Date: 11/08/2020 INDICATION: History of necrotizing pancreatitis, now with multiple complex fluid collections within the abdomen and pelvis despite operative debridement and placement of two large bore surgical peripancreatic drainage catheters. Request made for CT-guided aspiration(s) versus drainage catheter(s) placement for infection source control purposes. EXAM: 1. CT-GUIDED PERCUTANEOUS DRAINAGE CATHETER PLACEMENT X3 2. CT-GUIDED ASPIRATION OF LEFT LOWER ABDOMINAL/PELVIC FLUID COLLECTION COMPARISON:  CT abdomen pelvis-11/06/2020; 10/29/2020; 10/16/2020 MEDICATIONS: The patient is currently admitted to the hospital and receiving intravenous antibiotics. The antibiotics were administered within an appropriate time frame prior to the initiation of the procedure. ANESTHESIA/SEDATION: Moderate (conscious) sedation was employed during this procedure. A total of Versed 1 mg and Fentanyl 25 mcg was administered intravenously. Moderate Sedation Time: 65 minutes. The patient's level of consciousness and vital signs were monitored continuously by radiology nursing throughout the procedure under my direct supervision. CONTRAST:  None COMPLICATIONS: None immediate. PROCEDURE: Informed written consent was obtained from the patient after a discussion of the risks, benefits and alternatives to treatment. The patient was placed supine on the CT gantry and a pre procedural CT was performed re-demonstrating the known abscess/fluid collection within the perihepatic space with dominant mixed air and fluid containing collection  measuring at least 18.0 x 4.6 cm (image 20, series 2), the approximately 11.5 x 5.3 cm complex fluid collection within the left mid hemiabdomen (image 49, series 2), the approximately 9.4 x 2.6 cm complex fluid collection within the midline of the right mid abdomen at the level of the mesenteric root (image 64, series 2 as well as the ill-defined approximately 3.2 x 2.6 cm fluid collection within the left hemipelvis (image 94, series 3). The procedure was planned. A timeout was performed prior to the initiation of the procedure. The skin overlying the operative sites were prepped and draped in usual sterile fashion. The overlying soft tissues were anesthetized with 1% lidocaine with epinephrine. Each collection was targeted with an 18 gauge trocar needle. If purulent fluid was aspirated, then a short Amplatz wire was coiled within each collection. Ultimately each collection except the collection within the left hemipelvis was successfully cannulated with a short Amplatz wire allowing placement of 14 French drainage catheters at all locations. The collection within the left lower pelvis was not amenable to percutaneous drainage catheter placement secondary to inability to coil a wire within the collection however proximally 5 cc of bloody fluid was aspirated from this indeterminate fluid collection. The collection within the perihepatic space yielded 100 cc of blood tinged fluid. The collection within the right lower abdominal quadrant yielded 50 cc of purulent fluid while the collection within the left mid abdomen yielded 125 cc of purulent fluid. A representative aspirated sample from the collection with the left mid hemiabdomen was capped and sent to the laboratory for analysis All drainage catheters were flushed with a small amount of saline and all drainage catheters were connected to gravity bags and secured in place with interrupted sutures and StatLock devices. Dressings were applied. While uncomfortable  lying supine on the CT gantry, the patient tolerated the procedure well without immediate postprocedural complication. IMPRESSION: 1. Successful CT-guided placement of a 14  French drainage catheter within the complex collection within the left mid hemiabdomen yielding 125 cc of purulent fluid. A representative sample of aspirated fluid from this collection was capped and sent to the laboratory for analysis. 2. Successful CT-guided placement of 14 French perihepatic drainage catheter yielding 100 cc of blood tinged fluid. 3. Successful CT-guided placement of a 14 French drainage catheter within the complex fluid collection within the right lower abdomen at the level of the mesenteric root yielding 50 cc of purulent fluid. 4. Successful CT-guided aspiration of approximately 5 cc of blood tinged fluid from the indeterminate fluid collection within the left hemipelvis, not amenable to drainage catheter placement given inability to coil a wire within this poorly defined collection/hematoma. Electronically Signed   By: Simonne Come M.D.   On: 11/08/2020 14:42    Anti-infectives: Anti-infectives (From admission, onward)    Start     Dose/Rate Route Frequency Ordered Stop   11/09/20 1730  Ampicillin-Sulbactam (UNASYN) 3 g in sodium chloride 0.9 % 100 mL IVPB        3 g 200 mL/hr over 30 Minutes Intravenous Every 6 hours 11/09/20 1617     11/06/20 1545  piperacillin-tazobactam (ZOSYN) IVPB 3.375 g  Status:  Discontinued        3.375 g 12.5 mL/hr over 240 Minutes Intravenous Every 8 hours 11/06/20 1453 11/09/20 1616   11/01/20 0900  levofloxacin (LEVAQUIN) IVPB 750 mg  Status:  Discontinued        750 mg 100 mL/hr over 90 Minutes Intravenous Every 24 hours 11/01/20 0824 11/06/20 1408   11/01/20 0900  metroNIDAZOLE (FLAGYL) IVPB 500 mg  Status:  Discontinued        500 mg 100 mL/hr over 60 Minutes Intravenous Every 12 hours 11/01/20 0824 11/06/20 1408   10/29/20 2200  meropenem (MERREM) 1 g in sodium chloride  0.9 % 100 mL IVPB  Status:  Discontinued        1 g 200 mL/hr over 30 Minutes Intravenous Every 8 hours 10/29/20 1428 11/01/20 0824   10/28/20 1615  meropenem (MERREM) 2 g in sodium chloride 0.9 % 100 mL IVPB  Status:  Discontinued        2 g 200 mL/hr over 30 Minutes Intravenous Every 8 hours 10/28/20 1515 10/29/20 1428   10/28/20 1445  metroNIDAZOLE (FLAGYL) IVPB 500 mg  Status:  Discontinued        500 mg 100 mL/hr over 60 Minutes Intravenous Every 12 hours 10/28/20 1348 10/28/20 1505   10/25/20 2300  fluconazole (DIFLUCAN) IVPB 400 mg        400 mg 100 mL/hr over 120 Minutes Intravenous Every 24 hours 10/25/20 0742 10/29/20 0718   10/24/20 2300  fluconazole (DIFLUCAN) IVPB 200 mg  Status:  Discontinued        200 mg 100 mL/hr over 60 Minutes Intravenous Every 24 hours 10/24/20 0943 10/25/20 0742   10/22/20 1800  vancomycin (VANCOREADY) IVPB 1500 mg/300 mL  Status:  Discontinued        1,500 mg 150 mL/hr over 120 Minutes Intravenous Every 24 hours 2020/11/13 2225 10/22/20 0759   10/22/20 0900  linezolid (ZYVOX) IVPB 600 mg  Status:  Discontinued        600 mg 300 mL/hr over 60 Minutes Intravenous Every 12 hours 10/22/20 0800 10/27/20 0852   10/22/20 0300  piperacillin-tazobactam (ZOSYN) IVPB 3.375 g  Status:  Discontinued        3.375 g 12.5 mL/hr over 240 Minutes Intravenous Every  8 hours 11-05-2020 2225 10/28/20 1514   11/05/2020 2335  metroNIDAZOLE (FLAGYL) IVPB 500 mg  Status:  Discontinued        500 mg 100 mL/hr over 60 Minutes Intravenous Every 12 hours Nov 05, 2020 2142 10/22/20 0747   11-05-2020 2248  fluconazole (DIFLUCAN) IVPB 400 mg  Status:  Discontinued        400 mg 100 mL/hr over 120 Minutes Intravenous Every 24 hours 2020/11/05 2142 10/24/20 0943   2020/11/05 1748  vancomycin (VANCOREADY) IVPB 2000 mg/400 mL        2,000 mg 200 mL/hr over 120 Minutes Intravenous  Once 11/05/20 1706 2020-11-05 2113   2020/11/05 1715  piperacillin-tazobactam (ZOSYN) IVPB 3.375 g        3.375 g 100  mL/hr over 30 Minutes Intravenous  Once November 05, 2020 1706 Nov 05, 2020 1903        Assessment/Plan POD 19 s/p ex lap with pancreatic debridement for infected necrotic pancreatitis by Dr. Sheliah Hatch on 9/9 - Superior drain under the mesocolon along with the head of the pancreas - Inferior drain under the mesocolon along the left upper abdomen and what is likely a position inferior to the pancreas - CT 9/24 w/ gas and fluid collections that are grossly stable in the retroperitoneum and are drained by JP tubes. Cont drains - CT 9/24 also with mildly increased amount of fluid is noted around the liver with an increased amount of air seen in the non dependent portion concerning for persistent bowel leak. - pt is having bowel function and IR drains placed 9/26 do not appear feculent. Low clinical suspicion for bowel leak - resume post-pyloric tube feeds - s/p IR drains 9/26. Cx with enterococcus faecalis, sensitivities pending  - BID WTD - Cont therapies, rec CIR who is following - Pulm toilet - Appreciate TRH's and ID's assistance w/ his care   FEN: NPO, resume TF ID: Zosyn VTE: SCDs, heparin subq. LE [place] neg for DVT's Foley - out, ext cath, voiding    - Per primary -  VDRF - now extubated PNA w/ + resp cx's - cx's w/ stenotrophomonas maltophilia - on abx B/l pleural effusions Abl anemia  ETOH use  AKI - resolved T2DM  LOS: 20 days    Juliet Rude, [facility] 11/10/2020, 9:40 AM Please see Amion for pager number during day hours 7:00am-4:30pm

## 2020-11-10 NOTE — Progress Notes (Signed)
PROGRESS NOTE  Paul Chambers TGG:[id number] DOB: Nov 20, 1965   PCP: Pcp, No  Patient is from: Home  DOA: 11/03/2020 LOS: 20  Chief complaints:  Chief Complaint  Patient presents with   Altered Mental Status     Brief Narrative / Interim history: 55 year old M with PMH of DM-2, EtOH abuse, pancreatitis and tobacco use presenting with altered mental status and abdominal pain.  Per wife, started presentation to healthcare facilities ([facility], [facility] and finally [facility]) with abdominal pain.  In ED, CTH negative.  CT a/p with pneumoperitoneum without clear sight of perforation. Started on Vanc and Zosyn. He underwent ex lap on 9/9 and found to have large necrotic fluid surrounding pancreas concerning for necrotic infected pancreatitis but no bowel perforation.  He had 2 drains placed.  No fluid culture sent.  He was transferred to ICU postop.  He was extubated on 9/12 to Venturi mask.  Came off sedation on 9/13 and transferred to [facility] service on 9/15.  Hospital course complicated by ARF in the setting of possible aspiration pneumonia, exudative left pleural effusion, ABLA requiring transfusion, AME/delirium and severe malnutrition from prolonged n.p.o. requiring TPN. Eventually had cortrack and started on TF, and weaned off TPN. However, he continued to spike intermittent fever and tachycardia despite antibiotics.  ID consulted and changed antibiotics to IV Zosyn on 9/24. Repeat CT a/p showed perihepatic fluid (new).  IR placed additional 3 drains on 9/26. He remains on IV Zosyn per ID pending cultures.   Subjective: Seen and examined earlier this morning.  No major events overnight of this morning.  No complaints but confused.  He is only oriented to self, city, state and month.  He follows command.  He responds no to pain or shortness of breath.  Objective: Vitals:   11/09/20 2313 11/10/20 0347 11/10/20 0844 11/10/20 1258  BP: 130/78 123/78  123/83  Pulse: (!) 108 95 (!) 102    Resp: 20  20 20   Temp: 100 F (37.8 C) 98.7 F (37.1 C)  98.3 F (36.8 C)  TempSrc: Oral Oral  Oral  SpO2: 92% 94% 90% 92%  Weight:      Height:        Intake/Output Summary (Last 24 hours) at 11/10/2020 1550 Last data filed at 11/10/2020 1351 Gross per 24 hour  Intake --  Output 1121 ml  Net -1121 ml   Filed Weights   11/03/20 0346 11/04/20 0603 11/08/20 0500  Weight: 98.1 kg 100.2 kg 96.1 kg    Examination:  GENERAL: No apparent distress.  Nontoxic. HEENT: MMM.  Vision and hearing grossly intact.  NECK: Supple.  No apparent JVD.  RESP: 94% on 6 L.  No IWOB.  Poor aeration bilaterally.  Poor inspiratory effort on IS. CVS:  RRR. Heart sounds normal.  ABD/GI/GU: BS+. Abd soft, NTND.  Drains over RUQ, RLQ and LLQ.  Some with purulence. MSK/EXT:  Moves extremities. No apparent deformity. No edema.  SKIN: no apparent skin lesion or wound NEURO: Awake but confused.  Oriented to self, city, state and months.  Follows commands.  No apparent focal neuro deficit. PSYCH: Calm. Normal affect.   Procedures:  9/9-ex lap with drain placement by Dr. Dr. 11/10/20 9/9-9/13-intubation and mechanical ventilation 9/26-drain placement by IR  Microbiology summarized: 9/8-COVID-19 and influenza PCR nonreactive. 9/9-MRSA PCR screen negative. 9/8 and 9/9-blood cultures NGTD 9/15-sputum culture with STENOTROPHOMONAS MALTOPHILIA sensitive to Levaquin and Bactrim 9/21-blood cultures NGTD. 9/26-abdominal drain culture with rare Enterococcus faecalis  Assessment & Plan: Sepsis due to  infected necrotizing pancreatitis with pneumoperitoneum and possible aspiration pneumonia -Manage individual problems as below.    Infected necrotizing pancreatitis with pneumoperitoneum, intra-abdominal and retroperitoneal fluid: -S/p ex-lap and drain placement by Dr. Drexel Iha on 9/9.  Fluid culture was not sent -Repeat CT a/p  on 9/17 with large amount of residual gas and fluid dissecting through the  retroperitoneum -Repeat CT a/p over 9/24 with increased perihepatic fluid collection. -9/26- three additional drains by IR. -9/8 Vanco+Zosyn>9/9>Zyvox+Zosyn>9/14 >meropenem>9/19>Levaquin+Flagyl>9/24 IV Zosyn per ID> 9/27>IV Unasyn> -Surgical wound care per general surgery. -Resume postpyloric tube feed per general surgery. -IS/OOB/PT/OT   Acute respiratory failure with hypoxia/possible aspiration pneumonia/atelectasis: CXR with hypoinflation and retrocardiac opacity most likely from atelectasis versus infectious process.  Increased oxygen requirement.  Currently on 8 L by HFNC to maintain saturation in low 80s. -ETT postop 9/9-10/2011.  -Aggressive pulmonary toileting, IS, OOB, PT/OT.  -Antibiotics as above.  Anasarca/edema-likely due to malnutrition and hypoalbuminemia.  BNP 160.  TTE on 9/15 basically normal.  Anasarca improved with IV Lasix and IV albumin -Continue holding IV Lasix for now -Closely monitor fluid status, renal functions and electrolytes  Acute toxic-metabolic encephalopathy/delirium: Encephalopathy likely from severe sepsis and ICU delirium.  -Treat treatable causes  -Reorientation and delirium precautions.   -Continue IV Haldol as needed -Fall and aspiration precautions    Ileus: Seems to have resolved.  Diarrhea: Likely from TF.  Low suspicion for C. difficile but is at risk.  Improved.   Acute hepatitis: Likely from TPN.  Resolved.   Alcohol abuse: Out of the window for withdrawal complications.   -Continue thiamine and folate   Acute Blood Loss Anemia, postsurgical: S/p 1 unit of pRBCs on 9/10 with stabilization in hemoglobin since.  Recent Labs    10/29/20 1008 10/30/20 0328 10/31/20 0352 11/02/20 0925 11/04/20 0847 11/05/20 0345 11/06/20 0620 11/08/20 0557 11/09/20 0500 11/10/20 0430  HGB 8.6* 8.3* 8.4* 8.6* 7.8* 8.0* 8.5* 8.3* 8.1* 8.0*  -Continue monitoring  Controlled DM-2 with hyperglycemia: A1c 6.8% on admission. Recent Labs  Lab  11/09/20 2132 11/10/20 0021 11/10/20 0428 11/10/20 0908 11/10/20 1203  GLUCAP 191* 187* 135* 146* 126*  -Continue SSI-high every 4 hours -Continue basal insulin at 10 units daily -Resume tube feed.  Discontinue dextrose -Further adjustment as appropriate   Hypokalemia/hypomagnesemia/hypophosphatemia: Resolved.   Dysphagia: Cleared by SLP -Remains n.p.o. pending decision by general surgery  Debility/physical deconditioning -Continue PT/OT  Severe protein calorie malnutrition in the setting of n.p.o. as evidenced by anasarca, hypoalbuminemia Body mass index is 31.29 kg/m. Nutrition Problem: Severe Malnutrition Etiology: acute illness (infected necrotic pancreatitis) Signs/Symptoms: moderate fat depletion, moderate muscle depletion, energy intake < or equal to 50% for > or equal to 5 days, percent weight loss (8.4% weight loss in 1.5 months) Percent weight loss: 8.4 % (1.5 months) Interventions: TPN, Tube feeding, Refer to RD note for recommendations   DVT prophylaxis:  heparin injection 5,000 Units Start: 10/22/20 1400 Place and maintain sequential compression device Start: 11/13/2020 2202 SCDs Start: 2020-11-13 2145  Code Status: Full code Family Communication: Updated patient's wife at bedside on 9/27.  None at bedside today. Level of care: Progressive Status is: Inpatient  Remains inpatient appropriate because:Ongoing active pain requiring inpatient pain management, IV treatments appropriate due to intensity of illness or inability to take PO, and Inpatient level of care appropriate due to severity of illness  Dispo: The patient is from: Home              Anticipated d/c is to: CIR/LTACH  Patient currently is not medically stable to d/c.   Difficult to place patient No       Consultants:  General surgery Infectious disease Interventional radiology   Sch Meds:  Scheduled Meds:  chlorhexidine  15 mL Mouth Rinse BID   Chlorhexidine Gluconate Cloth  6  each Topical Q0600   feeding supplement (PROSource TF)  45 mL Per Tube TID   fluticasone furoate-vilanterol  1 puff Inhalation Daily   And   umeclidinium bromide  1 puff Inhalation Daily   folic acid  1 mg Per Tube Daily   heparin  5,000 Units Subcutaneous Q8H   insulin aspart  0-20 Units Subcutaneous Q4H   insulin glargine-yfgn  10 Units Subcutaneous Daily   mouth rinse  15 mL Mouth Rinse q12n4p   pantoprazole (PROTONIX) IV  40 mg Intravenous Q24H   sodium chloride flush  10-40 mL Intracatheter Q12H   sodium chloride flush  10-40 mL Intracatheter Q12H   thiamine  100 mg Per Tube Daily   Continuous Infusions:  sodium chloride Stopped (10/31/20 0131)   ampicillin-sulbactam (UNASYN) IV 3 g (11/10/20 1201)   feeding supplement (VITAL 1.5 CAL) 30 mL/hr at 11/10/20 1300   PRN Meds:.sodium chloride, acetaminophen (TYLENOL) oral liquid 160 mg/5 mL, acetaminophen, albuterol, docusate, fentaNYL, haloperidol lactate, HYDROmorphone (DILAUDID) injection, midazolam, ondansetron (ZOFRAN) IV, polyethylene glycol, sodium chloride flush, sodium chloride flush  Antimicrobials: Anti-infectives (From admission, onward)    Start     Dose/Rate Route Frequency Ordered Stop   11/09/20 1730  Ampicillin-Sulbactam (UNASYN) 3 g in sodium chloride 0.9 % 100 mL IVPB        3 g 200 mL/hr over 30 Minutes Intravenous Every 6 hours 11/09/20 1617     11/06/20 1545  piperacillin-tazobactam (ZOSYN) IVPB 3.375 g  Status:  Discontinued        3.375 g 12.5 mL/hr over 240 Minutes Intravenous Every 8 hours 11/06/20 1453 11/09/20 1616   11/01/20 0900  levofloxacin (LEVAQUIN) IVPB 750 mg  Status:  Discontinued        750 mg 100 mL/hr over 90 Minutes Intravenous Every 24 hours 11/01/20 0824 11/06/20 1408   11/01/20 0900  metroNIDAZOLE (FLAGYL) IVPB 500 mg  Status:  Discontinued        500 mg 100 mL/hr over 60 Minutes Intravenous Every 12 hours 11/01/20 0824 11/06/20 1408   10/29/20 2200  meropenem (MERREM) 1 g in sodium  chloride 0.9 % 100 mL IVPB  Status:  Discontinued        1 g 200 mL/hr over 30 Minutes Intravenous Every 8 hours 10/29/20 1428 11/01/20 0824   10/28/20 1615  meropenem (MERREM) 2 g in sodium chloride 0.9 % 100 mL IVPB  Status:  Discontinued        2 g 200 mL/hr over 30 Minutes Intravenous Every 8 hours 10/28/20 1515 10/29/20 1428   10/28/20 1445  metroNIDAZOLE (FLAGYL) IVPB 500 mg  Status:  Discontinued        500 mg 100 mL/hr over 60 Minutes Intravenous Every 12 hours 10/28/20 1348 10/28/20 1505   10/25/20 2300  fluconazole (DIFLUCAN) IVPB 400 mg        400 mg 100 mL/hr over 120 Minutes Intravenous Every 24 hours 10/25/20 0742 10/29/20 0718   10/24/20 2300  fluconazole (DIFLUCAN) IVPB 200 mg  Status:  Discontinued        200 mg 100 mL/hr over 60 Minutes Intravenous Every 24 hours 10/24/20 0943 10/25/20 0742   10/22/20 1800  vancomycin (  VANCOREADY) IVPB 1500 mg/300 mL  Status:  Discontinued        1,500 mg 150 mL/hr over 120 Minutes Intravenous Every 24 hours 10/20/2020 2225 10/22/20 0759   10/22/20 0900  linezolid (ZYVOX) IVPB 600 mg  Status:  Discontinued        600 mg 300 mL/hr over 60 Minutes Intravenous Every 12 hours 10/22/20 0800 10/27/20 0852   10/22/20 0300  piperacillin-tazobactam (ZOSYN) IVPB 3.375 g  Status:  Discontinued        3.375 g 12.5 mL/hr over 240 Minutes Intravenous Every 8 hours 10/22/2020 2225 10/28/20 1514   10/28/2020 2335  metroNIDAZOLE (FLAGYL) IVPB 500 mg  Status:  Discontinued        500 mg 100 mL/hr over 60 Minutes Intravenous Every 12 hours 10/31/2020 2142 10/22/20 0747   10/26/2020 2248  fluconazole (DIFLUCAN) IVPB 400 mg  Status:  Discontinued        400 mg 100 mL/hr over 120 Minutes Intravenous Every 24 hours 11/05/2020 2142 10/24/20 0943   10/28/2020 1748  vancomycin (VANCOREADY) IVPB 2000 mg/400 mL        2,000 mg 200 mL/hr over 120 Minutes Intravenous  Once 10/24/2020 1706 10/31/2020 2113   11/09/2020 1715  piperacillin-tazobactam (ZOSYN) IVPB 3.375 g        3.375  g 100 mL/hr over 30 Minutes Intravenous  Once 11/07/2020 1706 11/05/2020 1903        I have personally reviewed the following labs and images: CBC: Recent Labs  Lab 11/04/20 0847 11/05/20 0345 11/06/20 0620 11/08/20 0557 11/09/20 0500 11/10/20 0430  WBC 8.2 8.6 10.7* 13.0* 13.4* 15.3*  NEUTROABS 6.4  --  8.0*  --   --   --   HGB 7.8* 8.0* 8.5* 8.3* 8.1* 8.0*  HCT 25.1* 25.0* 27.1* 25.9* 25.8* 26.0*  MCV 104.6* 104.2* 105.4* 100.4* 102.4* 102.8*  PLT 191 215 235 326 350 366   BMP &GFR Recent Labs  Lab 11/04/20 0847 11/06/20 0617 11/07/20 0440 11/08/20 0557 11/09/20 0450 11/09/20 0500 11/10/20 0430  NA 137 139 136 135 133*  --  130*  K 4.0 3.8 3.9 4.0 3.5  --  3.6  CL 104 105 100 97* 96*  --  96*  CO2 27 27 29 30 30   --  30  GLUCOSE 236* 215* 155* 172* 180*  --  147*  BUN 16 17 16 16 13   --  9  CREATININE 0.58* 0.51* 0.65 0.70 0.61  --  0.52*  CALCIUM 8.2* 7.9* 8.1* 7.9* 7.9*  --  7.8*  MG 1.9  --  1.8 2.0  --  1.9 2.0  PHOS 3.1 2.9 3.2 4.0 3.2  --  2.7   Estimated Creatinine Clearance: 123.6 mL/min (A) (by C-G formula based on SCr of 0.52 mg/dL (L)). Liver & Pancreas: Recent Labs  Lab 11/04/20 0847 11/06/20 0617 11/06/20 0618 11/07/20 0440 11/08/20 0557 11/09/20 0450 11/10/20 0430  AST 23  --  26 24 20   --   --   ALT 17  --  16 15 14   --   --   ALKPHOS 76  --  89 82 76  --   --   BILITOT 0.5  --  0.6 0.7 0.8  --   --   PROT 5.6*  --  5.9* 5.9* 6.3*  --   --   ALBUMIN 1.4*   < > 1.3* 1.9* 1.7* 1.6* 1.5*   < > = values in this interval not displayed.  No results for input(s): LIPASE, AMYLASE in the last 168 hours. Recent Labs  Lab 11/07/20 0440  AMMONIA 38*   Diabetic: No results for input(s): HGBA1C in the last 72 hours. Recent Labs  Lab 11/09/20 2132 11/10/20 0021 11/10/20 0428 11/10/20 0908 11/10/20 1203  GLUCAP 191* 187* 135* 146* 126*   Cardiac Enzymes: Recent Labs  Lab 11/07/20 0440  CKTOTAL 10*   No results for input(s): PROBNP  in the last 8760 hours. Coagulation Profile: No results for input(s): INR, PROTIME in the last 168 hours. Thyroid Function Tests: No results for input(s): TSH, T4TOTAL, FREET4, T3FREE, THYROIDAB in the last 72 hours.  Lipid Profile: Recent Labs    11/08/20 0557  TRIG 101   Anemia Panel: No results for input(s): VITAMINB12, FOLATE, FERRITIN, TIBC, IRON, RETICCTPCT in the last 72 hours. Urine analysis:    Component Value Date/Time   COLORURINE AMBER (A) Nov 19, 2020 1549   APPEARANCEUR CLEAR 19-Nov-2020 1549   LABSPEC 1.015 2020-11-19 1549   PHURINE 6.0 11-19-20 1549   GLUCOSEU NEGATIVE 11-19-20 1549   HGBUR TRACE (A) 11/19/2020 1549   BILIRUBINUR SMALL (A) 19-Nov-2020 1549   KETONESUR 15 (A) November 19, 2020 1549   PROTEINUR NEGATIVE November 19, 2020 1549   NITRITE NEGATIVE 11/19/20 1549   LEUKOCYTESUR NEGATIVE 19-Nov-2020 1549   Sepsis Labs: Invalid input(s): PROCALCITONIN, LACTICIDVEN  Microbiology: Recent Results (from the past 240 hour(s))  Culture, blood (Routine X 2) w Reflex to ID Panel     Status: None   Collection Time: 11/03/20 11:23 AM   Specimen: BLOOD  Result Value Ref Range Status   Specimen Description BLOOD LEFT ANTECUBITAL  Final   Special Requests   Final    BOTTLES DRAWN AEROBIC ONLY Blood Culture adequate volume   Culture   Final    NO GROWTH 5 DAYS Performed at [facility], [address] [place], [place] [address]    Report Status 11/08/2020 FINAL  Final  Culture, blood (Routine X 2) w Reflex to ID Panel     Status: None   Collection Time: 11/03/20 11:24 AM   Specimen: BLOOD LEFT HAND  Result Value Ref Range Status   Specimen Description BLOOD LEFT HAND  Final   Special Requests   Final    BOTTLES DRAWN AEROBIC ONLY Blood Culture adequate volume   Culture   Final    NO GROWTH 5 DAYS Performed at [facility], [address], [place], [place] [address]    Report Status 11/08/2020 FINAL  Final  Culture, blood (routine x 2)     Status:  None (Preliminary result)   Collection Time: 11/07/20 11:45 AM   Specimen: BLOOD LEFT ARM  Result Value Ref Range Status   Specimen Description BLOOD LEFT ARM  Final   Special Requests   Final    BOTTLES DRAWN AEROBIC AND ANAEROBIC Blood Culture adequate volume   Culture   Final    NO GROWTH 3 DAYS Performed at [facility], [address] [place], [place] [address]    Report Status PENDING  Incomplete  Culture, blood (routine x 2)     Status: None (Preliminary result)   Collection Time: 11/07/20 11:51 AM   Specimen: BLOOD LEFT HAND  Result Value Ref Range Status   Specimen Description BLOOD LEFT HAND  Final   Special Requests   Final    BOTTLES DRAWN AEROBIC ONLY Blood Culture adequate volume   Culture   Final    NO GROWTH 3 DAYS  Performed at [facility], [address] [place], [place] [address]    Report Status PENDING  Incomplete  Aerobic/Anaerobic Culture w Gram Stain (surgical/deep wound)     Status: None (Preliminary result)   Collection Time: 11/08/20  1:47 PM   Specimen: Wound  Result Value Ref Range Status   Specimen Description WOUND LEFT ABDOMEN  Final   Special Requests CT DRAIN  Final   Gram Stain   Final    NO ORGANISMS SEEN SQUAMOUS EPITHELIAL CELLS PRESENT MODERATE WBC PRESENT,BOTH PMN AND MONONUCLEAR ABUNDANT GRAM POSITIVE COCCI IN CLUSTERS RARE GRAM NEGATIVE RODS    Culture   Final    RARE ENTEROCOCCUS FAECALIS SUSCEPTIBILITIES TO FOLLOW Performed at [facility], [address], [place], [place] [address]    Report Status PENDING  Incomplete    Radiology Studies: No results found.   Paul Chambers [facility]  If 7PM-7AM, please contact night-coverage [email or web address] 11/10/2020, 3:50 PM

## 2020-11-10 NOTE — Progress Notes (Signed)
Large amounts of stool yellow/ brown draining from abdominal wound and JP drain.General surgery-Faera Donell Beers called to inform. Provider stated that It has been looking as described. Pt also has diarrhea. Provider asked to continue to monitor pt. BP 145/65, 115, Temp: 97.6Ax

## 2020-11-11 DIAGNOSIS — E8809 Other disorders of plasma-protein metabolism, not elsewhere classified: Secondary | ICD-10-CM | POA: Diagnosis not present

## 2020-11-11 DIAGNOSIS — K8591 Acute pancreatitis with uninfected necrosis, unspecified: Secondary | ICD-10-CM | POA: Diagnosis not present

## 2020-11-11 DIAGNOSIS — A419 Sepsis, unspecified organism: Secondary | ICD-10-CM | POA: Diagnosis not present

## 2020-11-11 DIAGNOSIS — D539 Nutritional anemia, unspecified: Secondary | ICD-10-CM | POA: Diagnosis not present

## 2020-11-11 LAB — CBC
HCT: 26.5 % — ABNORMAL LOW (ref 39.0–52.0)
Hemoglobin: 8.3 g/dL — ABNORMAL LOW (ref 13.0–17.0)
MCH: 31.7 pg (ref 26.0–34.0)
MCHC: 31.3 g/dL (ref 30.0–36.0)
MCV: 101.1 fL — ABNORMAL HIGH (ref 80.0–100.0)
Platelets: 368 10*3/uL (ref 150–400)
RBC: 2.62 MIL/uL — ABNORMAL LOW (ref 4.22–5.81)
RDW: 18.4 % — ABNORMAL HIGH (ref 11.5–15.5)
WBC: 18.2 10*3/uL — ABNORMAL HIGH (ref 4.0–10.5)
nRBC: 0.2 % (ref 0.0–0.2)

## 2020-11-11 LAB — COMPREHENSIVE METABOLIC PANEL
ALT: 14 U/L (ref 0–44)
AST: 26 U/L (ref 15–41)
Albumin: 1.6 g/dL — ABNORMAL LOW (ref 3.5–5.0)
Alkaline Phosphatase: 91 U/L (ref 38–126)
Anion gap: 7 (ref 5–15)
BUN: 6 mg/dL (ref 6–20)
CO2: 29 mmol/L (ref 22–32)
Calcium: 7.9 mg/dL — ABNORMAL LOW (ref 8.9–10.3)
Chloride: 98 mmol/L (ref 98–111)
Creatinine, Ser: 0.53 mg/dL — ABNORMAL LOW (ref 0.61–1.24)
GFR, Estimated: >60 mL/min (ref >60)
Glucose, Bld: 214 mg/dL — ABNORMAL HIGH (ref 70–99)
Potassium: 3.8 mmol/L (ref 3.5–5.1)
Sodium: 134 mmol/L — ABNORMAL LOW (ref 135–145)
Total Bilirubin: 0.8 mg/dL (ref 0.3–1.2)
Total Protein: 5.6 g/dL — ABNORMAL LOW (ref 6.5–8.1)

## 2020-11-11 LAB — GLUCOSE, CAPILLARY
Glucose-Capillary: 212 mg/dL — ABNORMAL HIGH (ref 70–99)
Glucose-Capillary: 186 mg/dL — ABNORMAL HIGH (ref 70–99)
Glucose-Capillary: 256 mg/dL — ABNORMAL HIGH (ref 70–99)
Glucose-Capillary: 197 mg/dL — ABNORMAL HIGH (ref 70–99)
Glucose-Capillary: 211 mg/dL — ABNORMAL HIGH (ref 70–99)
Glucose-Capillary: 182 mg/dL — ABNORMAL HIGH (ref 70–99)

## 2020-11-11 LAB — PHOSPHORUS: Phosphorus: 2.5 mg/dL (ref 2.5–4.6)

## 2020-11-11 LAB — MAGNESIUM: Magnesium: 1.9 mg/dL (ref 1.7–2.4)

## 2020-11-11 MED ORDER — INSULIN GLARGINE-YFGN 100 UNIT/ML ~~LOC~~ SOLN
15.0000 [IU] | Freq: Every day | SUBCUTANEOUS | Status: DC
  Administered 2020-11-12: 15 [IU] via SUBCUTANEOUS
  Filled 2020-11-11 (×2): qty 0.15

## 2020-11-11 MED ORDER — METHOCARBAMOL 1000 MG/10ML IJ SOLN
500.0000 mg | Freq: Three times a day (TID) | INTRAVENOUS | Status: DC
  Administered 2020-11-11 – 2020-11-13 (×6): 500 mg via INTRAVENOUS
  Filled 2020-11-11 (×3): qty 5
  Filled 2020-11-11 (×2): qty 500
  Filled 2020-11-11 (×5): qty 5
  Filled 2020-11-11: qty 500
  Filled 2020-11-11: qty 5

## 2020-11-11 MED ORDER — INSULIN ASPART 100 UNIT/ML IJ SOLN
4.0000 [IU] | INTRAMUSCULAR | Status: DC
  Administered 2020-11-11 – 2020-11-12 (×6): 4 [IU] via SUBCUTANEOUS

## 2020-11-11 MED ORDER — FENTANYL 25 MCG/HR TD PT72
1.0000 | MEDICATED_PATCH | TRANSDERMAL | Status: DC
  Administered 2020-11-11 – 2020-11-14 (×3): 1 via TRANSDERMAL
  Filled 2020-11-11 (×4): qty 1

## 2020-11-11 NOTE — Progress Notes (Signed)
[facility]    Date of Admission:  November 17, 2020     ID: Paul Chambers is a 55 y.o. male with POD 20 s/p ex lap with pancreatic debridement for infected necrotic pancreatitis  on 9/9 and s/p drain placement Principal Problem:   Severe sepsis (HCC) Active Problems:   Acute necrotizing pancreatitis   Toxic encephalopathy   Hyponatremia   Hypoalbuminemia   Macrocytic anemia   Pressure injury of skin   Protein-calorie malnutrition, severe   Intra-abdominal abscess (HCC)    Subjective: Confused this morning, remains afebrile. Loose stools requiring rectal tube  Medications:   chlorhexidine  15 mL Mouth Rinse BID   Chlorhexidine Gluconate Cloth  6 each Topical Q0600   feeding supplement (PROSource TF)  45 mL Per Tube TID   fentaNYL  1 patch Transdermal Q72H   fluticasone furoate-vilanterol  1 puff Inhalation Daily   And   umeclidinium bromide  1 puff Inhalation Daily   folic acid  1 mg Per Tube Daily   heparin  5,000 Units Subcutaneous Q8H   insulin aspart  0-20 Units Subcutaneous Q4H   insulin glargine-yfgn  10 Units Subcutaneous Daily   mouth rinse  15 mL Mouth Rinse q12n4p   pantoprazole (PROTONIX) IV  40 mg Intravenous Q24H   sodium chloride flush  10-40 mL Intracatheter Q12H   sodium chloride flush  10-40 mL Intracatheter Q12H   thiamine  100 mg Per Tube Daily    Objective: Vital signs in last 24 hours: Temp:  [98 F (36.7 C)-99 F (37.2 C)] 98.6 F (37 C) (09/29 1130) Pulse Rate:  [98-110] 107 (09/29 1130) Resp:  [17-20] 20 (09/29 1130) BP: (122-138)/(75-80) 122/75 (09/29 1130) SpO2:  [90 %-96 %] 96 % (09/29 1130) Weight:  [98.3 kg] 98.3 kg (09/29 0500) Physical Exam  Constitutional: He is oriented to person, only He appears chronically ill and well-nourished. No distress.  HENT:  Mouth/Throat: Oropharynx is clear and moist. No oropharyngeal exudate.  Cardiovascular: Normal rate, regular rhythm and normal heart sounds. Exam reveals no  gallop and no friction rub.  No murmur heard.  Pulmonary/Chest: Effort normal and breath sounds normal. No respiratory distress. He has no wheezes.  Abdominal: Soft. Bowel sounds are normal. He exhibits mild distention; surgical incision bandaged. 3 drains in place. Rectal tube/ foley catheter in place Neurological: He is alert and oriented to person,  Skin: Skin is warm and dry. No rash noted. No erythema.  Psychiatric: He has a normal mood and affect. His behavior is normal.    Lab Results Recent Labs    11/10/20 0430 11/11/20 0415  WBC 15.3* 18.2*  HGB 8.0* 8.3*  HCT 26.0* 26.5*  NA 130* 134*  K 3.6 3.8  CL 96* 98  CO2 30 29  BUN 9 6  CREATININE 0.52* 0.53*   Liver Panel Recent Labs    11/10/20 0430 11/11/20 0415  PROT  --  5.6*  ALBUMIN 1.5* 1.6*  AST  --  26  ALT  --  14  ALKPHOS  --  91  BILITOT  --  0.8    Microbiology: 9/26 aspirate - rare e.coli Studies/Results: No results found.   Assessment/Plan: Necrotic pancreatitis/intra-abdominal abscess s/p drainage = currently on amp/sub to cover enterococcus/GNR/anaerobes  Leukocytosis = still elevated, likely from infection/pancreatitis  Severe protein-calorie malnutrition = continue with tube feeds  Diarrhea = continue to monitor. Suspect due to tube feeds.if has fever/or abdominal pain, then will test for cdifficile.  [facility] Pager: [phone]  11/11/2020, 3:35 PM

## 2020-11-11 NOTE — Progress Notes (Signed)
Physical Therapy Treatment Patient Details Name: Paul Chambers MRN: [id number] DOB: 1965-06-27 Today's Date: 11/11/2020   History of Present Illness Pt adm 9/8 with abdominal pain and AMS. Pt found to have extensive pneumoperitoneum and underwent ex lap on 9/9. Pt with infected necrotizing pancreatitis. Pt with acute hypoxic respiratory failure. Pt intubated 9/9-9/12. PMH - etoh, HTN, gout, pancreatitis.    PT Comments    Pt received in supine, lethargic but agreeable to therapy session with encouragement. Pt desat to 79% on 10L HF [place] with exertion (standing transfer), improved to 88% only once returned to supine with HOB elevated after ~5 minutes and max cues for pursed-lip breathing, RN present and aware. Pt with noted elevated respiratory rate >30 rpm. Pt performed functional mobility tasks with up to +2 mod/maxA and increased time/encouragement. Pt continues to benefit from PT services to progress toward functional mobility goals. Disposition updated per discussion with supervising PT [facility].  Recommendations for follow up therapy are one component of a multi-disciplinary discharge planning process, led by the attending physician.  Recommendations may be updated based on patient status, additional functional criteria and insurance authorization.  Follow Up Recommendations  LTACH;Supervision for mobility/OOB     Equipment Recommendations  Rolling walker with 5" wheels;Other (comment) (continue to assess, pending progress)    Recommendations for Other Services       Precautions / Restrictions Precautions Precautions: Fall Precaution Comments: Several abdominal drains (JPD R x2 and x2 gravity drains on R/1 on LLQ, perihepatic drains) Restrictions Weight Bearing Restrictions: No     Mobility  Bed Mobility Overal bed mobility: Needs Assistance Bed Mobility: Rolling;Sidelying to Sit;Sit to Sidelying Rolling: Mod assist;Min assist Sidelying to sit: HOB elevated;Max assist;+2 for  physical assistance     Sit to sidelying: Mod assist;+2 for safety/equipment General bed mobility comments: cues for log roll sequencing and multimodal cues needed with hand over hand assist toward bed rail at times. Pt with poor initiation for sidelying to sit transfer    Transfers Overall transfer level: Needs assistance Equipment used: Standard walker Transfers: Sit to/from Stand Sit to Stand: +2 physical assistance;Mod assist         General transfer comment: from EOB to standard walker, pt unable to side step or weight shift today despite max cues  Ambulation/Gait                 Stairs             Wheelchair Mobility    Modified Rankin (Stroke Patients Only)       Balance Overall balance assessment: Needs assistance Sitting-balance support: Feet supported;Bilateral upper extremity supported Sitting balance-Leahy Scale: Fair Sitting balance - Comments: minA to min guard for safety while seated; pt drowsy   Standing balance support: Bilateral upper extremity supported;During functional activity Standing balance-Leahy Scale: Poor Standing balance comment: reliant on standard walker UE support and +2 for safety and unable to weight shift                            Cognition Arousal/Alertness: Lethargic Behavior During Therapy: Flat affect Overall Cognitive Status: Impaired/Different from baseline Area of Impairment: Following commands;Problem solving;Safety/judgement                 Orientation Level: Person;Place     Following Commands: Follows one step commands with increased time Safety/Judgement: Decreased awareness of safety   Problem Solving: Slow processing;Difficulty sequencing;Requires verbal cues;Requires tactile cues;Decreased initiation General  Comments: Patient drowsy throughout needs cues for eyes open, pt resistant to anxiety due to fear of pain/falls but participatory as able with heavy encouragement/cues.       Exercises      General Comments General comments (skin integrity, edema, etc.): HR 108 bpm with exertion; SpO2 desat to 79% on 10L HF [place] after standing at bedside improves to 82% after 3 minutes seated break and improves to 88% within 5 minutes once returned to supine, RN aware      Pertinent Vitals/Pain Pain Assessment: Faces Faces Pain Scale: Hurts even more Pain Location: abdomen, peri area (rectal pouch) Pain Descriptors / Indicators: Sore;Discomfort;Grimacing Pain Intervention(s): Limited activity within patient's tolerance;Monitored during session;Premedicated before session;Repositioned    Home Living                      Prior Function            PT Goals (current goals can now be found in the care plan section) Acute Rehab PT Goals Patient Stated Goal: To go to CIR and then home. PT Goal Formulation: With patient Time For Goal Achievement: 11/12/20 Potential to Achieve Goals: Good Progress towards PT goals: Not progressing toward goals - comment (limited progress, dispo updated per discussion with supervising PT)    Frequency    Min 3X/week      PT Plan Current plan remains appropriate    Co-evaluation PT/OT/SLP Co-Evaluation/Treatment: Yes Reason for Co-Treatment: Complexity of the patient's impairments (multi-system involvement);Necessary to address cognition/behavior during functional activity;For patient/therapist safety;To address functional/ADL transfers PT goals addressed during session: Mobility/safety with mobility;Balance;Proper use of DME;Strengthening/ROM        AM-PAC PT "6 Clicks" Mobility   Outcome Measure  Help needed turning from your back to your side while in a flat bed without using bedrails?: A Lot Help needed moving from lying on your back to sitting on the side of a flat bed without using bedrails?: Total Help needed moving to and from a bed to a chair (including a wheelchair)?: Total Help needed standing up from a chair  using your arms (e.g., wheelchair or bedside chair)?: A Lot Help needed to walk in hospital room?: Total Help needed climbing 3-5 steps with a railing? : Total 6 Click Score: 8    End of Session Equipment Utilized During Treatment: Oxygen (10L HF [place]) Activity Tolerance: Patient limited by fatigue;Patient limited by pain Patient left: in bed;with call bell/phone within reach;with bed alarm set;with nursing/sitter in room Nurse Communication: Mobility status;Other (comment) (pt desat on 10L HF [place] with exertion) PT Visit Diagnosis: Unsteadiness on feet (R26.81);Other abnormalities of gait and mobility (R26.89);Muscle weakness (generalized) (M62.81)     Time: 8657-8469 PT Time Calculation (min) (ACUTE ONLY): 28 min  Charges:  $Therapeutic Activity: 8-22 mins                     Tyshae Stair P., PTA Acute Rehabilitation Services Pager: [phone] Office: [phone]    Angus Palms 11/11/2020, 2:50 PM

## 2020-11-11 NOTE — Progress Notes (Signed)
Occupational Therapy Treatment Patient Details Name: Paul Chambers MRN: [id number] DOB: Jun 18, 1965 Today's Date: 11/11/2020   History of present illness Pt adm 9/8 with abdominal pain and AMS. Pt found to have extensive pneumoperitoneum and underwent ex lap on 9/9. Pt with infected necrotizing pancreatitis. Pt with acute hypoxic respiratory failure. Pt intubated 9/9-9/12. PMH - etoh, HTN, gout, pancreatitis.   OT comments  Pt making incremental progress with OT goals this session. He was lethargic throughout the session, but agreeable to movement with encouragement. Pt requiring mod-max A +2 for all functional mobility and min-mod A for ADL's this session. He continues to be confused with decreased awareness and difficulty following simple commands with increased time. Pt d/c recs updated to [facility] with continued OT services. OT will continue to follow acutely.     Recommendations for follow up therapy are one component of a multi-disciplinary discharge planning process, led by the attending physician.  Recommendations may be updated based on patient status, additional functional criteria and insurance authorization.    Follow Up Recommendations  LTACH    Equipment Recommendations  3 in 1 bedside commode;Tub/shower seat    Recommendations for Other Services      Precautions / Restrictions Precautions Precautions: Fall Precaution Comments: Several abdominal drains (JPD R x2 and x2 gravity drains on R/1 on LLQ, perihepatic drains) Restrictions Weight Bearing Restrictions: No       Mobility Bed Mobility Overal bed mobility: Needs Assistance Bed Mobility: Rolling;Sidelying to Sit;Sit to Sidelying Rolling: Mod assist;Min assist Sidelying to sit: HOB elevated;Max assist;+2 for physical assistance     Sit to sidelying: Mod assist;+2 for safety/equipment General bed mobility comments: cues for log roll sequencing and multimodal cues needed with hand over hand assist toward bed rail at  times. Pt with poor initiation for sidelying to sit transfer    Transfers Overall transfer level: Needs assistance Equipment used: Standard walker Transfers: Sit to/from Stand Sit to Stand: +2 physical assistance;Mod assist         General transfer comment: from EOB to standard walker, pt unable to side step or weight shift today despite max cues    Balance Overall balance assessment: Needs assistance Sitting-balance support: Feet supported;Bilateral upper extremity supported Sitting balance-Leahy Scale: Fair Sitting balance - Comments: minA to min guard for safety while seated; pt drowsy   Standing balance support: Bilateral upper extremity supported;During functional activity Standing balance-Leahy Scale: Poor Standing balance comment: reliant on standard walker UE support and +2 for safety and unable to weight shift                           ADL either performed or assessed with clinical judgement   ADL Overall ADL's : Needs assistance/impaired Eating/Feeding: NPO Eating/Feeding Details (indicate cue type and reason): Peg tube in place Grooming: Wash/dry hands;Wash/dry face;Minimal assistance;Sitting Grooming Details (indicate cue type and reason): completed EOB, required min A to initiate task     Lower Body Bathing: Moderate assistance;Sitting/lateral leans Lower Body Bathing Details (indicate cue type and reason): Assisted with cleaning pericare, mod A for thoroughness Upper Body Dressing : Minimal assistance;Sitting Upper Body Dressing Details (indicate cue type and reason): Pt assisted with donning hospital gown EOB                   General ADL Comments: OT assisted with dressing, bathing, and grooming this session, attempted a transfer, however pt was unable to maintain standing and pivotting with max A +  2.     Vision       Perception     Praxis      Cognition Arousal/Alertness: Lethargic Behavior During Therapy: Flat affect Overall  Cognitive Status: Impaired/Different from baseline Area of Impairment: Following commands;Problem solving;Safety/judgement                 Orientation Level: Person;Place     Following Commands: Follows one step commands with increased time Safety/Judgement: Decreased awareness of safety   Problem Solving: Slow processing;Difficulty sequencing;Requires verbal cues;Requires tactile cues;Decreased initiation General Comments: Patient drowsy throughout needs cues for eyes open, pt resistant to anxiety due to fear of pain/falls but participatory as able with heavy encouragement/cues.        Exercises     Shoulder Instructions       General Comments HR 108 bpm with exertion; SpO2 desat to 79% on 10L HF [place] after standing at bedside improves to 82% after 3 minutes seated break and improves to 88% within 5 minutes once returned to supine, RN aware    Pertinent Vitals/ Pain       Pain Assessment: Faces Faces Pain Scale: Hurts even more Pain Location: abdomen, peri area (rectal pouch) Pain Descriptors / Indicators: Sore;Discomfort;Grimacing Pain Intervention(s): Limited activity within patient's tolerance;Monitored during session;Repositioned;Premedicated before session  Home Living                                          Prior Functioning/Environment              Frequency  Min 2X/week        Progress Toward Goals  OT Goals(current goals can now be found in the care plan section)  Progress towards OT goals: Progressing toward goals  Acute Rehab OT Goals Patient Stated Goal: To go to CIR and then home. OT Goal Formulation: With patient Time For Goal Achievement: 11/26/20 Potential to Achieve Goals: Good ADL Goals Pt Will Perform Grooming: with set-up;sitting Pt Will Perform Upper Body Bathing: sitting;with supervision Pt Will Perform Lower Body Bathing: with min assist;sit to/from stand Pt Will Perform Upper Body Dressing: with  supervision;sitting Pt Will Perform Lower Body Dressing: with min assist;sit to/from stand Pt Will Transfer to Toilet: with min assist;ambulating;regular height toilet Pt/caregiver will Perform Home Exercise Program: Increased strength;Both right and left upper extremity;With theraband;With Supervision;With written HEP provided  Plan Discharge plan remains appropriate;Frequency remains appropriate    Co-evaluation    PT/OT/SLP Co-Evaluation/Treatment: Yes Reason for Co-Treatment: Complexity of the patient's impairments (multi-system involvement);Necessary to address cognition/behavior during functional activity;For patient/therapist safety;To address functional/ADL transfers PT goals addressed during session: Mobility/safety with mobility;Balance;Proper use of DME;Strengthening/ROM OT goals addressed during session: ADL's and self-care;Strengthening/ROM      AM-PAC OT "6 Clicks" Daily Activity     Outcome Measure   Help from another person eating meals?: A Little Help from another person taking care of personal grooming?: A Little Help from another person toileting, which includes using toliet, bedpan, or urinal?: Total Help from another person bathing (including washing, rinsing, drying)?: A Lot Help from another person to put on and taking off regular upper body clothing?: A Little Help from another person to put on and taking off regular lower body clothing?: Total 6 Click Score: 13    End of Session Equipment Utilized During Treatment: Oxygen;Gait belt;Rolling walker  OT Visit Diagnosis: Unsteadiness on feet (R26.81);Muscle weakness (generalized) (  M62.81);Other symptoms and signs involving cognitive function;Pain Pain - Right/Left:  (Middle) Pain - part of body:  (Abdomen)   Activity Tolerance Patient limited by pain;Patient limited by fatigue   Patient Left in bed;with call bell/phone within reach;with bed alarm set;with nursing/sitter in room   Nurse Communication Mobility  status        Time: 2595-6387 OT Time Calculation (min): 28 min  Charges: OT General Charges $OT Visit: 1 Visit OT Treatments $Self Care/Home Management : 8-22 mins  Tekesha Almgren H., OTR/L Acute Rehabilitation  Mareta Chesnut Elane Bing [place]ume 11/11/2020, 4:56 PM

## 2020-11-11 NOTE — Progress Notes (Signed)
  Moises Blood   Rehab Admission Coordinator  Specialty:  Speech Pathology  Progress Notes     Incomplete  Date of Service:  11/10/2020 12:50 PM           Incomplete                              Inpatient Rehab Admissions Coordinator:     Pt. Is not medically ready for CIR at this time. At this point, Pt.'s nursing needs may be too great for a home health agency to manage after a ~2 week CIR Stay (Pt. With extensive wound care needs and coretrak for nutrition). Pt. May need LTACH instead of CIR (He could potentially come to CIR if he had continued need and med CIR criteria  at the end of  and LTACH stay).    Megan Salon, MS, CCC-SLP Rehab Admissions Coordinator  [phone] (office)           Note Details  Author Moises Blood File Time 11/10/2020 12:56 PM  Author Type Rehab Admission Coordinator Status Incomplete  Last Editor Moises Blood Specialty [facility] Acct # [id number] Admit Date 15-Nov-2020

## 2020-11-11 NOTE — Progress Notes (Signed)
PROGRESS NOTE  Paul Chambers OZH:[id number] DOB: Feb 27, 1965   PCP: Pcp, No  Patient is from: Home  DOA: 11-04-2020 LOS: 21  Chief complaints:  Chief Complaint  Patient presents with   Altered Mental Status     Brief Narrative / Interim history: 55 year old M with PMH of DM-2, EtOH abuse, pancreatitis and tobacco use presenting with altered mental status and abdominal pain.  Per wife, started presentation to healthcare facilities ([facility], [place] and finally [place]) with abdominal pain.  In ED, CTH negative.  CT a/p with pneumoperitoneum without clear sight of perforation. Started on Vanc and Zosyn. He underwent ex lap on 9/9 and found to have large necrotic fluid surrounding pancreas concerning for necrotic infected pancreatitis but no bowel perforation.  He had 2 drains placed.  No fluid culture sent.  He was transferred to ICU postop.  He was extubated on 9/12 to Venturi mask.  Came off sedation on 9/13 and transferred to [facility] service on 9/15.  Hospital course complicated by ARF in the setting of possible aspiration pneumonia, exudative left pleural effusion, ABLA requiring transfusion, AME/delirium and severe malnutrition from prolonged n.p.o. requiring TPN. Eventually had cortrack and started on TF, and weaned off TPN. However, he continued to spike intermittent fever and tachycardia despite antibiotics.  ID consulted and changed antibiotics to IV Zosyn on 9/24. Repeat CT a/p showed perihepatic fluid (new).  IR placed additional 3 drains on 9/26.  Fluid culture with Enterococcus faecalis.  Antibiotic de-escalated to IV Unasyn.  Subjective: Seen and examined earlier this morning.  No major events overnight of this morning.  Reports feeling hot.  Denies pain or shortness of breath but not a great historian due to confusion.  Does not appear to be in distress.  Objective: Vitals:   11/11/20 0810 11/11/20 1043 11/11/20 1130 11/11/20 1546  BP:   122/75 136/81  Pulse:   (!) 107  (!) 107  Resp:   20 20  Temp:  99 F (37.2 C) 98.6 F (37 C) 99.1 F (37.3 C)  TempSrc:  Oral Oral Oral  SpO2: 93%  96% 92%  Weight:      Height:        Intake/Output Summary (Last 24 hours) at 11/11/2020 1553 Last data filed at 11/11/2020 1500 Gross per 24 hour  Intake 640 ml  Output 1482 ml  Net -842 ml   Filed Weights   11/04/20 0603 11/08/20 0500 11/11/20 0500  Weight: 100.2 kg 96.1 kg 98.3 kg    Examination:  GENERAL: No apparent distress.  Nontoxic. HEENT: MMM.  Vision and hearing grossly intact.  NECK: Supple.  No apparent JVD.  RESP: 94% on 6 L.  No IWOB.  Poor aeration bilaterally.  Poor inspiratory effort on IS. CVS:  RRR. Heart sounds normal.  ABD/GI/GU: BS+. Abd soft, NTND.  Drains over RUQ, RLQ and LLQ.  Some with purulence. MSK/EXT:  Moves extremities. No apparent deformity. No edema.  SKIN: no apparent skin lesion or wound NEURO: Awake but confused.  Oriented to self, city, state and months.  Follows commands.  No apparent focal neuro deficit. PSYCH: Calm. Normal affect.   GENERAL: No apparent distress.  Nontoxic. HEENT: MMM.  Vision and hearing grossly intact.  NECK: Supple.  No apparent JVD.  RESP: 92% on 5 L.  No IWOB.  Diminished aeration bilaterally due to poor inspiratory effort. CVS:  RRR. Heart sounds normal.  ABD/GI/GU: BS+. Abd soft.  Multiple drains, most with purulence.  Dressing DCI. MSK/EXT:  Moves extremities.  No apparent deformity. No edema.  SKIN: no apparent skin lesion or wound NEURO: Awake and alert. Oriented appropriately.  No apparent focal neuro deficit. PSYCH: Calm. Normal affect.   Procedures:  9/9-ex lap with drain placement by Dr. Dr. Sheliah Hatch 9/9-9/13-intubation and mechanical ventilation 9/26-drain placement by IR  Microbiology summarized: 9/8-COVID-19 and influenza PCR nonreactive. 9/9-MRSA PCR screen negative. 9/8 and 9/9-blood cultures NGTD 9/15-sputum culture with STENOTROPHOMONAS MALTOPHILIA sensitive to Levaquin  and Bactrim 9/21-blood cultures NGTD. 9/26-abdominal drain culture with rare Enterococcus faecalis  Assessment & Plan: Sepsis due to infected necrotizing pancreatitis with pneumoperitoneum and possible aspiration pneumonia -Manage individual problems as below.    Infected necrotizing pancreatitis with pneumoperitoneum, intra-abdominal and retroperitoneal fluid: -S/p ex-lap and drain placement by Dr. Drexel Iha on 9/9.  Fluid culture was not sent -Repeat CT a/p  on 9/17 with large amount of residual gas and fluid dissecting through the retroperitoneum -Repeat CT a/p over 9/24 with increased perihepatic fluid collection. -9/26- three additional drains by IR.  Fluid culture with Enterococcus faecalis. -9/8-Vanco/Zosyn-9/9-Zyvox/Zosyn-9/14 -meropenem-9/19-Levaquin/Flagyl-9/24 Zosyn- 9/27- Unasyn-ID managing -Surgical wound care and pain management per general surgery. -Continue postpyloric tube feed per general surgery. -IS/OOB/PT/OT -Transfer to 6 N per general surgery.   Acute respiratory failure with hypoxia/possible aspiration pneumonia/atelectasis: CXR with hypoinflation and retrocardiac opacity most likely from atelectasis versus infectious process.  Increased oxygen requirement.  Currently on 8 L by HFNC to maintain saturation in low 80s. -ETT postop 9/9-10/2011.  -Aggressive pulmonary toileting, IS, OOB, PT/OT.  -Antibiotics as above.  Anasarca/edema-likely due to malnutrition and hypoalbuminemia.  BNP 160.  TTE on 9/15 basically normal.  Anasarca resolved with IV Lasix and IV albumin -Closely monitor fluid status, renal functions and electrolytes  Acute toxic-metabolic encephalopathy/delirium: Encephalopathy likely from severe sepsis and ICU delirium.  -Treat treatable causes  -Reorientation and delirium precautions.   -Continue IV Haldol as needed -Fall and aspiration precautions    Ileus: Seems to have resolved.  Diarrhea: Likely from TF.  Low suspicion for C. difficile but is  at risk.  Improved.   Acute hepatitis: Likely from TPN.  Resolved.   Alcohol abuse: Out of the window for withdrawal complications.   -Continue thiamine and folate   Acute Blood Loss Anemia, postsurgical: S/p 1 unit of pRBCs on 9/10 with stabilization in hemoglobin since.  Recent Labs    10/30/20 0328 10/31/20 0352 11/02/20 0925 11/04/20 0847 11/05/20 0345 11/06/20 0620 11/08/20 0557 11/09/20 0500 11/10/20 0430 11/11/20 0415  HGB 8.3* 8.4* 8.6* 7.8* 8.0* 8.5* 8.3* 8.1* 8.0* 8.3*  -Continue monitoring  Controlled DM-2 with hyperglycemia: A1c 6.8% on admission. Recent Labs  Lab 11/10/20 1943 11/10/20 2324 11/11/20 0331 11/11/20 0739 11/11/20 1218  GLUCAP 150* 136* 212* 186* 256*  -Continue SSI-high every 4 hours -Resume tube feed coverage at 4 units every 4 hours -Increase basal insulin from 10 to 15 units daily -Further adjustment as appropriate   Hypokalemia/hypomagnesemia/hypophosphatemia: Resolved.   Dysphagia: Cleared by SLP -Remains n.p.o. pending decision by general surgery  Debility/physical deconditioning -Continue PT/OT  Severe protein calorie malnutrition in the setting of n.p.o. as evidenced by anasarca, hypoalbuminemia Body mass index is 32 kg/m. Nutrition Problem: Severe Malnutrition Etiology: acute illness (infected necrotic pancreatitis) Signs/Symptoms: moderate fat depletion, moderate muscle depletion, energy intake < or equal to 50% for > or equal to 5 days, percent weight loss (8.4% weight loss in 1.5 months) Percent weight loss: 8.4 % (1.5 months) Interventions: TPN, Tube feeding, Refer to RD note for recommendations   DVT prophylaxis:  heparin injection 5,000 Units Start: 10/22/20 1400 Place and maintain sequential compression device Start: 11/01/2020 2202 SCDs Start: 10/22/2020 2145  Code Status: Full code Family Communication: Updated patient's wife at bedside on 9/27.  None at bedside today. Level of care: Telemetry Surgical Status is:  Inpatient  Remains inpatient appropriate because:Ongoing active pain requiring inpatient pain management, IV treatments appropriate due to intensity of illness or inability to take PO, and Inpatient level of care appropriate due to severity of illness  Dispo: The patient is from: Home              Anticipated d/c is to: [facility]              Patient currently is not medically stable to d/c.   Difficult to place patient No       Consultants:  General surgery Infectious disease Interventional radiology   Sch Meds:  Scheduled Meds:  chlorhexidine  15 mL Mouth Rinse BID   Chlorhexidine Gluconate Cloth  6 each Topical Q0600   feeding supplement (PROSource TF)  45 mL Per Tube TID   fentaNYL  1 patch Transdermal Q72H   fluticasone furoate-vilanterol  1 puff Inhalation Daily   And   umeclidinium bromide  1 puff Inhalation Daily   folic acid  1 mg Per Tube Daily   heparin  5,000 Units Subcutaneous Q8H   insulin aspart  0-20 Units Subcutaneous Q4H   insulin aspart  4 Units Subcutaneous Q4H   [START ON 11/12/2020] insulin glargine-yfgn  15 Units Subcutaneous Daily   mouth rinse  15 mL Mouth Rinse q12n4p   pantoprazole (PROTONIX) IV  40 mg Intravenous Q24H   sodium chloride flush  10-40 mL Intracatheter Q12H   sodium chloride flush  10-40 mL Intracatheter Q12H   thiamine  100 mg Per Tube Daily   Continuous Infusions:  sodium chloride Stopped (10/31/20 0131)   ampicillin-sulbactam (UNASYN) IV 3 g (11/11/20 1056)   feeding supplement (VITAL 1.5 CAL) 1,000 mL (11/10/20 2000)   methocarbamol (ROBAXIN) IV     PRN Meds:.sodium chloride, acetaminophen (TYLENOL) oral liquid 160 mg/5 mL, acetaminophen, albuterol, docusate, haloperidol lactate, HYDROmorphone (DILAUDID) injection, midazolam, ondansetron (ZOFRAN) IV, polyethylene glycol, sodium chloride flush, sodium chloride flush  Antimicrobials: Anti-infectives (From admission, onward)    Start     Dose/Rate Route Frequency Ordered Stop    11/09/20 1730  Ampicillin-Sulbactam (UNASYN) 3 g in sodium chloride 0.9 % 100 mL IVPB        3 g 200 mL/hr over 30 Minutes Intravenous Every 6 hours 11/09/20 1617     11/06/20 1545  piperacillin-tazobactam (ZOSYN) IVPB 3.375 g  Status:  Discontinued        3.375 g 12.5 mL/hr over 240 Minutes Intravenous Every 8 hours 11/06/20 1453 11/09/20 1616   11/01/20 0900  levofloxacin (LEVAQUIN) IVPB 750 mg  Status:  Discontinued        750 mg 100 mL/hr over 90 Minutes Intravenous Every 24 hours 11/01/20 0824 11/06/20 1408   11/01/20 0900  metroNIDAZOLE (FLAGYL) IVPB 500 mg  Status:  Discontinued        500 mg 100 mL/hr over 60 Minutes Intravenous Every 12 hours 11/01/20 0824 11/06/20 1408   10/29/20 2200  meropenem (MERREM) 1 g in sodium chloride 0.9 % 100 mL IVPB  Status:  Discontinued        1 g 200 mL/hr over 30 Minutes Intravenous Every 8 hours 10/29/20 1428 11/01/20 0824   10/28/20 1615  meropenem (MERREM) 2  g in sodium chloride 0.9 % 100 mL IVPB  Status:  Discontinued        2 g 200 mL/hr over 30 Minutes Intravenous Every 8 hours 10/28/20 1515 10/29/20 1428   10/28/20 1445  metroNIDAZOLE (FLAGYL) IVPB 500 mg  Status:  Discontinued        500 mg 100 mL/hr over 60 Minutes Intravenous Every 12 hours 10/28/20 1348 10/28/20 1505   10/25/20 2300  fluconazole (DIFLUCAN) IVPB 400 mg        400 mg 100 mL/hr over 120 Minutes Intravenous Every 24 hours 10/25/20 0742 10/29/20 0718   10/24/20 2300  fluconazole (DIFLUCAN) IVPB 200 mg  Status:  Discontinued        200 mg 100 mL/hr over 60 Minutes Intravenous Every 24 hours 10/24/20 0943 10/25/20 0742   10/22/20 1800  vancomycin (VANCOREADY) IVPB 1500 mg/300 mL  Status:  Discontinued        1,500 mg 150 mL/hr over 120 Minutes Intravenous Every 24 hours 11/09/2020 2225 10/22/20 0759   10/22/20 0900  linezolid (ZYVOX) IVPB 600 mg  Status:  Discontinued        600 mg 300 mL/hr over 60 Minutes Intravenous Every 12 hours 10/22/20 0800 10/27/20 0852    10/22/20 0300  piperacillin-tazobactam (ZOSYN) IVPB 3.375 g  Status:  Discontinued        3.375 g 12.5 mL/hr over 240 Minutes Intravenous Every 8 hours 10/19/2020 2225 10/28/20 1514   10/31/2020 2335  metroNIDAZOLE (FLAGYL) IVPB 500 mg  Status:  Discontinued        500 mg 100 mL/hr over 60 Minutes Intravenous Every 12 hours 11/05/2020 2142 10/22/20 0747   10/24/2020 2248  fluconazole (DIFLUCAN) IVPB 400 mg  Status:  Discontinued        400 mg 100 mL/hr over 120 Minutes Intravenous Every 24 hours 10/24/2020 2142 10/24/20 0943   10/23/2020 1748  vancomycin (VANCOREADY) IVPB 2000 mg/400 mL        2,000 mg 200 mL/hr over 120 Minutes Intravenous  Once 10/24/2020 1706 11/11/2020 2113   10/20/2020 1715  piperacillin-tazobactam (ZOSYN) IVPB 3.375 g        3.375 g 100 mL/hr over 30 Minutes Intravenous  Once 10/23/2020 1706 10/28/2020 1903        I have personally reviewed the following labs and images: CBC: Recent Labs  Lab 11/06/20 0620 11/08/20 0557 11/09/20 0500 11/10/20 0430 11/11/20 0415  WBC 10.7* 13.0* 13.4* 15.3* 18.2*  NEUTROABS 8.0*  --   --   --   --   HGB 8.5* 8.3* 8.1* 8.0* 8.3*  HCT 27.1* 25.9* 25.8* 26.0* 26.5*  MCV 105.4* 100.4* 102.4* 102.8* 101.1*  PLT 235 326 350 366 368   BMP &GFR Recent Labs  Lab 11/07/20 0440 11/08/20 0557 11/09/20 0450 11/09/20 0500 11/10/20 0430 11/11/20 0415  NA 136 135 133*  --  130* 134*  K 3.9 4.0 3.5  --  3.6 3.8  CL 100 97* 96*  --  96* 98  CO2 29 30 30   --  30 29  GLUCOSE 155* 172* 180*  --  147* 214*  BUN 16 16 13   --  9 6  CREATININE 0.65 0.70 0.61  --  0.52* 0.53*  CALCIUM 8.1* 7.9* 7.9*  --  7.8* 7.9*  MG 1.8 2.0  --  1.9 2.0 1.9  PHOS 3.2 4.0 3.2  --  2.7 2.5   Estimated Creatinine Clearance: 124.8 mL/min (A) (by C-G formula based on SCr of  0.53 mg/dL (L)). Liver & Pancreas: Recent Labs  Lab 11/06/20 0618 11/07/20 0440 11/08/20 0557 11/09/20 0450 11/10/20 0430 11/11/20 0415  AST 26 24 20   --   --  26  ALT 16 15 14   --   --  14   ALKPHOS 89 82 76  --   --  91  BILITOT 0.6 0.7 0.8  --   --  0.8  PROT 5.9* 5.9* 6.3*  --   --  5.6*  ALBUMIN 1.3* 1.9* 1.7* 1.6* 1.5* 1.6*   No results for input(s): LIPASE, AMYLASE in the last 168 hours. Recent Labs  Lab 11/07/20 0440  AMMONIA 38*   Diabetic: No results for input(s): HGBA1C in the last 72 hours. Recent Labs  Lab 11/10/20 1943 11/10/20 2324 11/11/20 0331 11/11/20 0739 11/11/20 1218  GLUCAP 150* 136* 212* 186* 256*   Cardiac Enzymes: Recent Labs  Lab 11/07/20 0440  CKTOTAL 10*   No results for input(s): PROBNP in the last 8760 hours. Coagulation Profile: No results for input(s): INR, PROTIME in the last 168 hours. Thyroid Function Tests: No results for input(s): TSH, T4TOTAL, FREET4, T3FREE, THYROIDAB in the last 72 hours.  Lipid Profile: No results for input(s): CHOL, HDL, LDLCALC, TRIG, CHOLHDL, LDLDIRECT in the last 72 hours.  Anemia Panel: No results for input(s): VITAMINB12, FOLATE, FERRITIN, TIBC, IRON, RETICCTPCT in the last 72 hours. Urine analysis:    Component Value Date/Time   COLORURINE AMBER (A) Nov 07, 2020 1549   APPEARANCEUR CLEAR 11-07-2020 1549   LABSPEC 1.015 November 07, 2020 1549   PHURINE 6.0 11/07/2020 1549   GLUCOSEU NEGATIVE 11-07-20 1549   HGBUR TRACE (A) 2020-11-07 1549   BILIRUBINUR SMALL (A) 11/07/20 1549   KETONESUR 15 (A) 11/07/2020 1549   PROTEINUR NEGATIVE November 07, 2020 1549   NITRITE NEGATIVE 2020-11-07 1549   LEUKOCYTESUR NEGATIVE Nov 07, 2020 1549   Sepsis Labs: Invalid input(s): PROCALCITONIN, LACTICIDVEN  Microbiology: Recent Results (from the past 240 hour(s))  Culture, blood (Routine X 2) w Reflex to ID Panel     Status: None   Collection Time: 11/03/20 11:23 AM   Specimen: BLOOD  Result Value Ref Range Status   Specimen Description BLOOD LEFT ANTECUBITAL  Final   Special Requests   Final    BOTTLES DRAWN AEROBIC ONLY Blood Culture adequate volume   Culture   Final    NO GROWTH 5 DAYS Performed at  [facility], [address], [place], [address] [place]    Report Status 11/08/2020 FINAL  Final  Culture, blood (Routine X 2) w Reflex to ID Panel     Status: None   Collection Time: 11/03/20 11:24 AM   Specimen: BLOOD LEFT HAND  Result Value Ref Range Status   Specimen Description BLOOD LEFT HAND  Final   Special Requests   Final    BOTTLES DRAWN AEROBIC ONLY Blood Culture adequate volume   Culture   Final    NO GROWTH 5 DAYS Performed at [facility], [address] [place], [address] [place]    Report Status 11/08/2020 FINAL  Final  Culture, blood (routine x 2)     Status: None (Preliminary result)   Collection Time: 11/07/20 11:45 AM   Specimen: BLOOD LEFT ARM  Result Value Ref Range Status   Specimen Description BLOOD LEFT ARM  Final   Special Requests   Final    BOTTLES DRAWN AEROBIC AND ANAEROBIC Blood Culture adequate volume   Culture   Final    NO GROWTH  4 DAYS Performed at [facility], [address], [place], [place] [address]    Report Status PENDING  Incomplete  Culture, blood (routine x 2)     Status: None (Preliminary result)   Collection Time: 11/07/20 11:51 AM   Specimen: BLOOD LEFT HAND  Result Value Ref Range Status   Specimen Description BLOOD LEFT HAND  Final   Special Requests   Final    BOTTLES DRAWN AEROBIC ONLY Blood Culture adequate volume   Culture   Final    NO GROWTH 4 DAYS Performed at [facility], [address] [place], [place] [address]    Report Status PENDING  Incomplete  Aerobic/Anaerobic Culture w Gram Stain (surgical/deep wound)     Status: None (Preliminary result)   Collection Time: 11/08/20  1:47 PM   Specimen: Wound  Result Value Ref Range Status   Specimen Description WOUND LEFT ABDOMEN  Final   Special Requests CT DRAIN  Final   Gram Stain   Final    NO ORGANISMS SEEN SQUAMOUS EPITHELIAL CELLS PRESENT MODERATE WBC PRESENT,BOTH PMN AND MONONUCLEAR ABUNDANT GRAM POSITIVE COCCI IN CLUSTERS RARE  GRAM NEGATIVE RODS    Culture   Final    RARE ENTEROCOCCUS FAECALIS SUSCEPTIBILITIES TO FOLLOW HOLDING FOR POSSIBLE ANAEROBE Performed at [facility], [address], [place], [place] [address]    Report Status PENDING  Incomplete    Radiology Studies: No results found.   Kao Berkheimer T. Nyra Anspaugh [facility]  If 7PM-7AM, please contact night-coverage [email or web address] 11/11/2020, 3:53 PM

## 2020-11-11 NOTE — Plan of Care (Signed)
Pt is alert oriented x 4. Pt has 4 drains to abdomen, Pts abdomen has stool draining. Pt c/o pain to abdomen and head through night. PRN medication given. Pt had diarrhea x 4.    Problem: Education: Goal: Knowledge of General Education information will improve Description: Including pain rating scale, medication(s)/side effects and non-pharmacologic comfort measures Outcome: Progressing   Problem: Health Behavior/Discharge Planning: Goal: Ability to manage health-related needs will improve Outcome: Progressing   Problem: Clinical Measurements: Goal: Ability to maintain clinical measurements within normal limits will improve Outcome: Progressing Goal: Will remain free from infection Outcome: Progressing Goal: Diagnostic test results will improve Outcome: Progressing Goal: Respiratory complications will improve Outcome: Progressing Goal: Cardiovascular complication will be avoided Outcome: Progressing

## 2020-11-11 NOTE — Progress Notes (Signed)
SLP Cancellation Note  Patient Details Name: Loomis Anacker MRN: [id number] DOB: 1965-02-25   Cancelled treatment:       Reason Eval/Treat Not Completed: Patient not medically ready. Pt with ongoing NPO plan per surgery. SLP will sign off at this time. Please reorder if needed.    Shalisha Clausing, Riley Nearing 11/11/2020, 7:41 AM

## 2020-11-11 NOTE — Progress Notes (Signed)
Progress Note  21 Days Post-Op  Subjective: Patient struggling with not being able to take in PO. He report diarrhea overnight with TF. RN reports he has had difficulty with pain control   Objective: Vital signs in last 24 hours: Temp:  [98 F (36.7 C)-98.3 F (36.8 C)] 98.1 F (36.7 C) (09/29 0747) Pulse Rate:  [98-110] 108 (09/29 0747) Resp:  [17-20] 18 (09/29 0747) BP: (123-138)/(80-83) 138/80 (09/29 0747) SpO2:  [90 %-93 %] 93 % (09/29 0810) Weight:  [98.3 kg] 98.3 kg (09/29 0500) Last BM Date: 11/11/20  Intake/Output from previous day: 09/28 0701 - 09/29 0700 In: 740 [NG/GT:300; IV Piggyback:200] Out: 1028 [Urine:700; Drains:328] Intake/Output this shift: No intake/output data recorded.  PE: Gen:  Awake and alert, lying in bed Heart: RRR Pulm: normal effort on nasal cannula Abd: Distended but soft, diffusely TTP without rigidity or guarding.  RUQ surgical JP drains x2 with tan-grey thick drainage RUQ IR drain SS  RLQ IR drain purulent LLQ IR serous Midline wound pale pink, clean without dehiscence Laparoscopic sites c/d/i   Lab Results:  Recent Labs    11/10/20 0430 11/11/20 0415  WBC 15.3* 18.2*  HGB 8.0* 8.3*  HCT 26.0* 26.5*  PLT 366 368   BMET Recent Labs    11/10/20 0430 11/11/20 0415  NA 130* 134*  K 3.6 3.8  CL 96* 98  CO2 30 29  GLUCOSE 147* 214*  BUN 9 6  CREATININE 0.52* 0.53*  CALCIUM 7.8* 7.9*   PT/INR No results for input(s): LABPROT, INR in the last 72 hours. CMP     Component Value Date/Time   NA 134 (L) 11/11/2020 0415   K 3.8 11/11/2020 0415   CL 98 11/11/2020 0415   CO2 29 11/11/2020 0415   GLUCOSE 214 (H) 11/11/2020 0415   BUN 6 11/11/2020 0415   CREATININE 0.53 (L) 11/11/2020 0415   CALCIUM 7.9 (L) 11/11/2020 0415   PROT 5.6 (L) 11/11/2020 0415   ALBUMIN 1.6 (L) 11/11/2020 0415   AST 26 11/11/2020 0415   ALT 14 11/11/2020 0415   ALKPHOS 91 11/11/2020 0415   BILITOT 0.8 11/11/2020 0415   GFRNONAA >60  11/11/2020 0415   Lipase     Component Value Date/Time   LIPASE 25 10/24/2020 1506       Studies/Results: No results found.  Anti-infectives: Anti-infectives (From admission, onward)    Start     Dose/Rate Route Frequency Ordered Stop   11/09/20 1730  Ampicillin-Sulbactam (UNASYN) 3 g in sodium chloride 0.9 % 100 mL IVPB        3 g 200 mL/hr over 30 Minutes Intravenous Every 6 hours 11/09/20 1617     11/06/20 1545  piperacillin-tazobactam (ZOSYN) IVPB 3.375 g  Status:  Discontinued        3.375 g 12.5 mL/hr over 240 Minutes Intravenous Every 8 hours 11/06/20 1453 11/09/20 1616   11/01/20 0900  levofloxacin (LEVAQUIN) IVPB 750 mg  Status:  Discontinued        750 mg 100 mL/hr over 90 Minutes Intravenous Every 24 hours 11/01/20 0824 11/06/20 1408   11/01/20 0900  metroNIDAZOLE (FLAGYL) IVPB 500 mg  Status:  Discontinued        500 mg 100 mL/hr over 60 Minutes Intravenous Every 12 hours 11/01/20 0824 11/06/20 1408   10/29/20 2200  meropenem (MERREM) 1 g in sodium chloride 0.9 % 100 mL IVPB  Status:  Discontinued        1 g 200 mL/hr  over 30 Minutes Intravenous Every 8 hours 10/29/20 1428 11/01/20 0824   10/28/20 1615  meropenem (MERREM) 2 g in sodium chloride 0.9 % 100 mL IVPB  Status:  Discontinued        2 g 200 mL/hr over 30 Minutes Intravenous Every 8 hours 10/28/20 1515 10/29/20 1428   10/28/20 1445  metroNIDAZOLE (FLAGYL) IVPB 500 mg  Status:  Discontinued        500 mg 100 mL/hr over 60 Minutes Intravenous Every 12 hours 10/28/20 1348 10/28/20 1505   10/25/20 2300  fluconazole (DIFLUCAN) IVPB 400 mg        400 mg 100 mL/hr over 120 Minutes Intravenous Every 24 hours 10/25/20 0742 10/29/20 0718   10/24/20 2300  fluconazole (DIFLUCAN) IVPB 200 mg  Status:  Discontinued        200 mg 100 mL/hr over 60 Minutes Intravenous Every 24 hours 10/24/20 0943 10/25/20 0742   10/22/20 1800  vancomycin (VANCOREADY) IVPB 1500 mg/300 mL  Status:  Discontinued        1,500 mg 150  mL/hr over 120 Minutes Intravenous Every 24 hours 10/18/2020 2225 10/22/20 0759   10/22/20 0900  linezolid (ZYVOX) IVPB 600 mg  Status:  Discontinued        600 mg 300 mL/hr over 60 Minutes Intravenous Every 12 hours 10/22/20 0800 10/27/20 0852   10/22/20 0300  piperacillin-tazobactam (ZOSYN) IVPB 3.375 g  Status:  Discontinued        3.375 g 12.5 mL/hr over 240 Minutes Intravenous Every 8 hours 10/20/2020 2225 10/28/20 1514   10/23/2020 2335  metroNIDAZOLE (FLAGYL) IVPB 500 mg  Status:  Discontinued        500 mg 100 mL/hr over 60 Minutes Intravenous Every 12 hours 10/31/2020 2142 10/22/20 0747   10/24/2020 2248  fluconazole (DIFLUCAN) IVPB 400 mg  Status:  Discontinued        400 mg 100 mL/hr over 120 Minutes Intravenous Every 24 hours 10/20/2020 2142 10/24/20 0943   11/04/2020 1748  vancomycin (VANCOREADY) IVPB 2000 mg/400 mL        2,000 mg 200 mL/hr over 120 Minutes Intravenous  Once 11/06/2020 1706 10/23/2020 2113   10/25/2020 1715  piperacillin-tazobactam (ZOSYN) IVPB 3.375 g        3.375 g 100 mL/hr over 30 Minutes Intravenous  Once 10/31/2020 1706 10/17/2020 1903        Assessment/Plan POD 20 s/p ex lap with pancreatic debridement for infected necrotic pancreatitis by Dr. Sheliah Hatch on 9/9 - Superior drain under the mesocolon along with the head of the pancreas - Inferior drain under the mesocolon along the left upper abdomen and what is likely a position inferior to the pancreas - CT 9/24 w/ gas and fluid collections that are grossly stable in the retroperitoneum and are drained by JP tubes. Cont drains - CT 9/24 also with mildly increased amount of fluid is noted around the liver with an increased amount of air seen in the non dependent portion concerning for persistent bowel leak. - pt is having bowel function and IR drains placed 9/26 do not appear feculent. Low clinical suspicion for bowel leak - continue post-pyloric tube feeds - s/p IR drains 9/26. Cx with enterococcus faecalis, sensitivities  pending  - BID WTD - Cont therapies, rec CIR - may need to consider LTACH with needs for continued post-pyloric TF - Pulm toilet - Appreciate TRH's and ID's assistance w/ his care   FEN: NPO, ok to increase TF to goal ID: currently  on unasyn VTE: SCDs, heparin subq. LE [place] neg for DVT's Foley - out, ext cath, voiding    - Per primary -  VDRF - now extubated PNA w/ + resp cx's - cx's w/ stenotrophomonas maltophilia - finished course of Levaquin  B/l pleural effusions Abl anemia  ETOH use  AKI - resolved T2DM  LOS: 21 days    Juliet Rude, [facility] 11/11/2020, 9:45 AM Please see Amion for pager number during day hours 7:00am-4:30pm

## 2020-11-12 DIAGNOSIS — K8591 Acute pancreatitis with uninfected necrosis, unspecified: Secondary | ICD-10-CM | POA: Diagnosis not present

## 2020-11-12 DIAGNOSIS — E8809 Other disorders of plasma-protein metabolism, not elsewhere classified: Secondary | ICD-10-CM | POA: Diagnosis not present

## 2020-11-12 DIAGNOSIS — D539 Nutritional anemia, unspecified: Secondary | ICD-10-CM | POA: Diagnosis not present

## 2020-11-12 DIAGNOSIS — A419 Sepsis, unspecified organism: Secondary | ICD-10-CM | POA: Diagnosis not present

## 2020-11-12 LAB — CBC
HCT: 28.8 % — ABNORMAL LOW (ref 39.0–52.0)
Hemoglobin: 8.9 g/dL — ABNORMAL LOW (ref 13.0–17.0)
MCH: 31.9 pg (ref 26.0–34.0)
MCHC: 30.9 g/dL (ref 30.0–36.0)
MCV: 103.2 fL — ABNORMAL HIGH (ref 80.0–100.0)
Platelets: 389 10*3/uL (ref 150–400)
RBC: 2.79 MIL/uL — ABNORMAL LOW (ref 4.22–5.81)
RDW: 18.9 % — ABNORMAL HIGH (ref 11.5–15.5)
WBC: 17.3 10*3/uL — ABNORMAL HIGH (ref 4.0–10.5)
nRBC: 0.2 % (ref 0.0–0.2)

## 2020-11-12 LAB — GLUCOSE, CAPILLARY
Glucose-Capillary: 136 mg/dL — ABNORMAL HIGH (ref 70–99)
Glucose-Capillary: 240 mg/dL — ABNORMAL HIGH (ref 70–99)
Glucose-Capillary: 257 mg/dL — ABNORMAL HIGH (ref 70–99)
Glucose-Capillary: 140 mg/dL — ABNORMAL HIGH (ref 70–99)
Glucose-Capillary: 230 mg/dL — ABNORMAL HIGH (ref 70–99)

## 2020-11-12 LAB — CULTURE, BLOOD (ROUTINE X 2)
Culture: NO GROWTH
Culture: NO GROWTH
Special Requests: ADEQUATE
Special Requests: ADEQUATE

## 2020-11-12 MED ORDER — INSULIN ASPART 100 UNIT/ML IJ SOLN
6.0000 [IU] | INTRAMUSCULAR | Status: DC
  Administered 2020-11-12 – 2020-11-13 (×5): 6 [IU] via SUBCUTANEOUS

## 2020-11-12 MED ORDER — SODIUM CHLORIDE 0.9% FLUSH: 5.0000 mL | Freq: Three times a day (TID) | INTRAVENOUS | Status: DC

## 2020-11-12 MED ORDER — INSULIN GLARGINE-YFGN 100 UNIT/ML ~~LOC~~ SOLN
20.0000 [IU] | Freq: Every day | SUBCUTANEOUS | Status: DC
  Administered 2020-11-13 – 2020-11-14 (×2): 20 [IU] via SUBCUTANEOUS
  Filled 2020-11-12 (×5): qty 0.2

## 2020-11-12 MED ORDER — PIPERACILLIN-TAZOBACTAM 3.375 G IVPB
3.3750 g | Freq: Three times a day (TID) | INTRAVENOUS | Status: DC
  Administered 2020-11-12 – 2020-11-27 (×45): 3.375 g via INTRAVENOUS
  Filled 2020-11-12 (×46): qty 50

## 2020-11-12 MED ORDER — SODIUM CHLORIDE 0.9% FLUSH
5.0000 mL | Freq: Three times a day (TID) | INTRAVENOUS | Status: DC
  Administered 2020-11-12: 5 mL via INTRAVENOUS

## 2020-11-12 NOTE — Progress Notes (Signed)
[facility]    Date of Admission:  10/29/2020     ID: Paul Chambers is a 55 y.o. male with POD 21 s/p ex lap with pancreatic debridement for infected necrotic pancreatitis -cultures showing enterococcus  Principal Problem:   Severe sepsis (HCC) Active Problems:   Acute necrotizing pancreatitis   Toxic encephalopathy   Hyponatremia   Hypoalbuminemia   Macrocytic anemia   Pressure injury of skin   Protein-calorie malnutrition, severe   Intra-abdominal abscess (HCC)    Subjective: Appears fatigue, less interactive. Has abdominal discomfort. Still significant output from drains. Rectal tube just changed  Medications:   chlorhexidine  15 mL Mouth Rinse BID   Chlorhexidine Gluconate Cloth  6 each Topical Q0600   feeding supplement (PROSource TF)  45 mL Per Tube TID   fentaNYL  1 patch Transdermal Q72H   fluticasone furoate-vilanterol  1 puff Inhalation Daily   And   umeclidinium bromide  1 puff Inhalation Daily   folic acid  1 mg Per Tube Daily   heparin  5,000 Units Subcutaneous Q8H   insulin aspart  0-20 Units Subcutaneous Q4H   insulin aspart  4 Units Subcutaneous Q4H   insulin glargine-yfgn  15 Units Subcutaneous Daily   mouth rinse  15 mL Mouth Rinse q12n4p   pantoprazole (PROTONIX) IV  40 mg Intravenous Q24H   sodium chloride flush  10-40 mL Intracatheter Q12H   sodium chloride flush  10-40 mL Intracatheter Q12H   sodium chloride flush  5 mL Intravenous Q8H   sodium chloride flush  5 mL Intravenous Q8H   sodium chloride flush  5 mL Intravenous Q8H   thiamine  100 mg Per Tube Daily    Objective: Vital signs in last 24 hours: Temp:  [97.8 F (36.6 C)-99.8 F (37.7 C)] 99.5 F (37.5 C) (09/30 1516) Pulse Rate:  [97-110] 106 (09/30 1516) Resp:  [18-20] 18 (09/30 1516) BP: (125-147)/(79-85) 134/83 (09/30 1516) SpO2:  [88 %-95 %] 91 % (09/30 1516)  Physical Exam  Constitutional: He is oriented to person, place, and time. He appears  chronically ill and well-nourished. No distress.  HENT:  Mouth/Throat: Oropharynx is clear and moist. No oropharyngeal exudate.  Cardiovascular: Normal rate, regular rhythm and normal heart sounds. Exam reveals no gallop and no friction rub.  No murmur heard.  Pulmonary/Chest: Effort normal and breath sounds normal. No respiratory distress. He has no wheezes.  Abdominal: Soft. Bowel sounds are decrease. + distended. RUQ surgical drains, JP x 2 - tan liquid. RUQ IR drain- purulent, RLQ IR drain purulent and LLQ drain -purulent. Midline wound covered. Port sites are C/D/I. Ext: +1 edema Neurological: He is alert and oriented to person, place, and time.  Skin: Skin is warm and dry. No rash noted. No erythema.   Lab Results Recent Labs    11/10/20 0430 11/11/20 0415 11/12/20 1142  WBC 15.3* 18.2* 17.3*  HGB 8.0* 8.3* 8.9*  HCT 26.0* 26.5* 28.8*  NA 130* 134*  --   K 3.6 3.8  --   CL 96* 98  --   CO2 30 29  --   BUN 9 6  --   CREATININE 0.52* 0.53*  --    Liver Panel Recent Labs    11/10/20 0430 11/11/20 0415  PROT  --  5.6*  ALBUMIN 1.5* 1.6*  AST  --  26  ALT  --  14  ALKPHOS  --  91  BILITOT  --  0.8  Sedimentation Rate No results for input(s): ESRSEDRATE in the last 72 hours. C-Reactive Protein No results for input(s): CRP in the last 72 hours.  Microbiology: Micro showing ecoli and clostridial species Studies/Results: No results found.   Assessment/Plan: Necrotic pancreatitis plus intra-abd abscess s/p IR drains x 3 plus surgical JP drains - has had uptrending WBC this past week. Culture just identified clostridial species. We will change back abtx to piptazo since that would cover both enterococcus, anaerobes like clostridium slightly better than amp/sub  Leukocytosis = recommend to keep checking to see it trends downward as we are changing his abtx today.   Overall condition guarded, if clinically worsens - will need step down unit  [facility] Pager: [phone]  11/12/2020, 3:37 PM

## 2020-11-12 NOTE — Progress Notes (Addendum)
Referring Physician(s): Leary Roca PA  Supervising Physician: Marliss Coots  Patient Status:  [facility] - In-pt  Chief Complaint:  3 abscess drains on 9.26.22 by Dr. Odis Luster 14 Fr left mid hemi-abdomen with aspiration of 125 ml of purulent output.    14 Fr perihepatic abscess drain with aspiration of 100 ml's of serosanguinous output.  14 Fr right lower abdominal drain at the level of the mesenteric root with 50 ml of purulent fluid noted.  Subjective:  Patient states that he is "tired" and does not want to "go home like this"  Allergies: Patient has no known allergies.  Medications: Prior to Admission medications   Medication Sig Start Date End Date Taking? Authorizing Provider  acetaminophen (TYLENOL) 500 MG tablet Take 500 mg by mouth every 6 (six) hours as needed for mild pain.   Yes [provider]  albuterol (VENTOLIN HFA) 108 (90 Base) MCG/ACT inhaler Inhale 1-2 puffs into the lungs every 6 (six) hours as needed for wheezing or shortness of breath.   Yes [provider]  allopurinol (ZYLOPRIM) 300 MG tablet Take 300 mg by mouth daily.   Yes [provider]  ALPRAZolam Prudy Feeler) 0.5 MG tablet Take 0.5 mg by mouth 3 (three) times daily as needed for anxiety.   Yes [provider]  amLODipine (NORVASC) 5 MG tablet Take 5 mg by mouth daily.   Yes [provider]  Fluticasone-Umeclidin-Vilant (TRELEGY ELLIPTA) 100-62.5-25 MCG/INH AEPB Inhale 1 puff into the lungs daily.   Yes [provider]  folic acid (FOLVITE) 1 MG tablet Take 1 mg by mouth daily.   Yes [provider]  furosemide (LASIX) 40 MG tablet Take 40 mg by mouth 2 (two) times daily.   Yes [provider]  Multiple Vitamins-Minerals (PRESERVISION AREDS 2+MULTI VIT PO) Take 2 tablets by mouth daily.   Yes [provider]  Omega-3 Krill Oil 500 MG CAPS Take 1,000 mg by mouth daily.   Yes [provider]  omeprazole (PRILOSEC) 40  MG capsule Take 40 mg by mouth daily.   Yes [provider]  oxyCODONE (OXY IR/ROXICODONE) 5 MG immediate release tablet Take 2.5-5 mg by mouth every 6 (six) hours as needed for severe pain.   Yes [provider]  thiamine (VITAMIN B-1) 100 MG tablet Take 100 mg by mouth daily.   Yes [provider]     Vital Signs: BP 137/85 (BP Location: Left Arm)   Pulse 97   Temp 98.3 F (36.8 C) (Oral)   Resp 18   Ht 5\' 9"  (1.753 m)   Wt 216 lb 11.4 oz (98.3 kg)   SpO2 93%   BMI 32.00 kg/m   Physical Exam Vitals and nursing note reviewed.  Constitutional:      Appearance: He is well-developed. He is ill-appearing.  HENT:     Head: Normocephalic.  Pulmonary:     Effort: Pulmonary effort is normal.  Abdominal:     Comments: Drain Location: Drain 1 - LUQ -medial  Size: Fr size: 14 Fr Date of placement: 9.26.22  Currently to: Drain collection device: gravity Output is yellow- tan  Drain Location: Drain 2 - RUQ - perihepatic Size: Fr size: 14 Fr Date of placement: 9.26.22  Currently to: Drain collection device: gravity Output is orange tan and purulent  Drain Location: Drain 3-  RLQ Size: Fr size: 14 Fr Date of placement: 9.26.22  Currently to: Drain collection device: gravity Output is tan and purulent  2 large bore rights sided surgical drains noted to be JP drain.   Musculoskeletal:        General: Normal range of motion.     Cervical back: Normal range of motion.  Skin:    General: Skin is dry.  Neurological:     Mental Status: He is alert and oriented to person, place, and time.    Imaging: CT ASPIRATION  Result Date: 11/08/2020 INDICATION: History of necrotizing pancreatitis, now with multiple complex fluid collections within the abdomen and pelvis despite operative debridement and placement of two large bore surgical peripancreatic drainage catheters. Request made for CT-guided aspiration(s) versus drainage catheter(s) placement for infection  source control purposes. EXAM: 1. CT-GUIDED PERCUTANEOUS DRAINAGE CATHETER PLACEMENT X3 2. CT-GUIDED ASPIRATION OF LEFT LOWER ABDOMINAL/PELVIC FLUID COLLECTION COMPARISON:  CT abdomen pelvis-11/06/2020; 10/29/2020; 11/07/2020 MEDICATIONS: The patient is currently admitted to the hospital and receiving intravenous antibiotics. The antibiotics were administered within an appropriate time frame prior to the initiation of the procedure. ANESTHESIA/SEDATION: Moderate (conscious) sedation was employed during this procedure. A total of Versed 1 mg and Fentanyl 25 mcg was administered intravenously. Moderate Sedation Time: 65 minutes. The patient's level of consciousness and vital signs were monitored continuously by radiology nursing throughout the procedure under my direct supervision. CONTRAST:  None COMPLICATIONS: None immediate. PROCEDURE: Informed written consent was obtained from the patient after a discussion of the risks, benefits and alternatives to treatment. The patient was placed supine on the CT gantry and a pre procedural CT was performed re-demonstrating the known abscess/fluid collection within the perihepatic space with dominant mixed air and fluid containing collection measuring at least 18.0 x 4.6 cm (image 20, series 2), the approximately 11.5 x 5.3 cm complex fluid collection within the left mid hemiabdomen (image 49, series 2), the approximately 9.4 x 2.6 cm complex fluid collection within the midline of the right mid abdomen at the level of the mesenteric root (image 64, series 2 as well as the ill-defined approximately 3.2 x 2.6 cm fluid collection within the left hemipelvis (image 94, series 3). The procedure was planned. A timeout was performed prior to the initiation of the procedure. The skin overlying the operative sites were prepped and draped in usual sterile fashion. The overlying soft tissues were anesthetized with 1% lidocaine with epinephrine. Each collection was targeted with an 18 gauge  trocar needle. If purulent fluid was aspirated, then a short Amplatz wire was coiled within each collection. Ultimately each collection except the collection within the left hemipelvis was successfully cannulated with a short Amplatz wire allowing placement of 14 French drainage catheters at all locations. The collection within the left lower pelvis was not amenable to percutaneous drainage catheter placement secondary to inability to coil a wire within the collection however proximally 5 cc of bloody fluid was aspirated from this indeterminate fluid collection. The collection within the perihepatic space yielded 100 cc of blood tinged fluid. The collection within the right lower abdominal quadrant yielded 50 cc of purulent fluid while the collection within the left mid abdomen yielded 125 cc of purulent fluid. A representative aspirated sample from the collection with the left mid hemiabdomen was capped and sent to the laboratory for analysis All drainage catheters were flushed with a small amount of saline and all drainage catheters were connected to gravity bags and secured in place with interrupted sutures and StatLock devices. Dressings were applied. While uncomfortable lying supine on the CT gantry, the patient tolerated the procedure well without immediate postprocedural  complication. IMPRESSION: 1. Successful CT-guided placement of a 14 French drainage catheter within the complex collection within the left mid hemiabdomen yielding 125 cc of purulent fluid. A representative sample of aspirated fluid from this collection was capped and sent to the laboratory for analysis. 2. Successful CT-guided placement of 14 French perihepatic drainage catheter yielding 100 cc of blood tinged fluid. 3. Successful CT-guided placement of a 14 French drainage catheter within the complex fluid collection within the right lower abdomen at the level of the mesenteric root yielding 50 cc of purulent fluid. 4. Successful CT-guided  aspiration of approximately 5 cc of blood tinged fluid from the indeterminate fluid collection within the left hemipelvis, not amenable to drainage catheter placement given inability to coil a wire within this poorly defined collection/hematoma. Electronically Signed   By: Simonne Come M.D.   On: 11/08/2020 14:42   CT IMAGE GUIDED DRAINAGE BY PERCUTANEOUS CATHETER  Result Date: 11/08/2020 INDICATION: History of necrotizing pancreatitis, now with multiple complex fluid collections within the abdomen and pelvis despite operative debridement and placement of two large bore surgical peripancreatic drainage catheters. Request made for CT-guided aspiration(s) versus drainage catheter(s) placement for infection source control purposes. EXAM: 1. CT-GUIDED PERCUTANEOUS DRAINAGE CATHETER PLACEMENT X3 2. CT-GUIDED ASPIRATION OF LEFT LOWER ABDOMINAL/PELVIC FLUID COLLECTION COMPARISON:  CT abdomen pelvis-11/06/2020; 10/29/2020; 10/18/2020 MEDICATIONS: The patient is currently admitted to the hospital and receiving intravenous antibiotics. The antibiotics were administered within an appropriate time frame prior to the initiation of the procedure. ANESTHESIA/SEDATION: Moderate (conscious) sedation was employed during this procedure. A total of Versed 1 mg and Fentanyl 25 mcg was administered intravenously. Moderate Sedation Time: 65 minutes. The patient's level of consciousness and vital signs were monitored continuously by radiology nursing throughout the procedure under my direct supervision. CONTRAST:  None COMPLICATIONS: None immediate. PROCEDURE: Informed written consent was obtained from the patient after a discussion of the risks, benefits and alternatives to treatment. The patient was placed supine on the CT gantry and a pre procedural CT was performed re-demonstrating the known abscess/fluid collection within the perihepatic space with dominant mixed air and fluid containing collection measuring at least 18.0 x 4.6 cm  (image 20, series 2), the approximately 11.5 x 5.3 cm complex fluid collection within the left mid hemiabdomen (image 49, series 2), the approximately 9.4 x 2.6 cm complex fluid collection within the midline of the right mid abdomen at the level of the mesenteric root (image 64, series 2 as well as the ill-defined approximately 3.2 x 2.6 cm fluid collection within the left hemipelvis (image 94, series 3). The procedure was planned. A timeout was performed prior to the initiation of the procedure. The skin overlying the operative sites were prepped and draped in usual sterile fashion. The overlying soft tissues were anesthetized with 1% lidocaine with epinephrine. Each collection was targeted with an 18 gauge trocar needle. If purulent fluid was aspirated, then a short Amplatz wire was coiled within each collection. Ultimately each collection except the collection within the left hemipelvis was successfully cannulated with a short Amplatz wire allowing placement of 14 French drainage catheters at all locations. The collection within the left lower pelvis was not amenable to percutaneous drainage catheter placement secondary to inability to coil a wire within the collection however proximally 5 cc of bloody fluid was aspirated from this indeterminate fluid collection. The collection within the perihepatic space yielded 100 cc of blood tinged fluid. The collection within the right lower abdominal quadrant yielded 50 cc of purulent  fluid while the collection within the left mid abdomen yielded 125 cc of purulent fluid. A representative aspirated sample from the collection with the left mid hemiabdomen was capped and sent to the laboratory for analysis All drainage catheters were flushed with a small amount of saline and all drainage catheters were connected to gravity bags and secured in place with interrupted sutures and StatLock devices. Dressings were applied. While uncomfortable lying supine on the CT gantry, the  patient tolerated the procedure well without immediate postprocedural complication. IMPRESSION: 1. Successful CT-guided placement of a 14 French drainage catheter within the complex collection within the left mid hemiabdomen yielding 125 cc of purulent fluid. A representative sample of aspirated fluid from this collection was capped and sent to the laboratory for analysis. 2. Successful CT-guided placement of 14 French perihepatic drainage catheter yielding 100 cc of blood tinged fluid. 3. Successful CT-guided placement of a 14 French drainage catheter within the complex fluid collection within the right lower abdomen at the level of the mesenteric root yielding 50 cc of purulent fluid. 4. Successful CT-guided aspiration of approximately 5 cc of blood tinged fluid from the indeterminate fluid collection within the left hemipelvis, not amenable to drainage catheter placement given inability to coil a wire within this poorly defined collection/hematoma. Electronically Signed   By: Simonne Come M.D.   On: 11/08/2020 14:42   CT IMAGE GUIDED DRAINAGE BY PERCUTANEOUS CATHETER  Result Date: 11/08/2020 INDICATION: History of necrotizing pancreatitis, now with multiple complex fluid collections within the abdomen and pelvis despite operative debridement and placement of two large bore surgical peripancreatic drainage catheters. Request made for CT-guided aspiration(s) versus drainage catheter(s) placement for infection source control purposes. EXAM: 1. CT-GUIDED PERCUTANEOUS DRAINAGE CATHETER PLACEMENT X3 2. CT-GUIDED ASPIRATION OF LEFT LOWER ABDOMINAL/PELVIC FLUID COLLECTION COMPARISON:  CT abdomen pelvis-11/06/2020; 10/29/2020; 11/03/2020 MEDICATIONS: The patient is currently admitted to the hospital and receiving intravenous antibiotics. The antibiotics were administered within an appropriate time frame prior to the initiation of the procedure. ANESTHESIA/SEDATION: Moderate (conscious) sedation was employed during this  procedure. A total of Versed 1 mg and Fentanyl 25 mcg was administered intravenously. Moderate Sedation Time: 65 minutes. The patient's level of consciousness and vital signs were monitored continuously by radiology nursing throughout the procedure under my direct supervision. CONTRAST:  None COMPLICATIONS: None immediate. PROCEDURE: Informed written consent was obtained from the patient after a discussion of the risks, benefits and alternatives to treatment. The patient was placed supine on the CT gantry and a pre procedural CT was performed re-demonstrating the known abscess/fluid collection within the perihepatic space with dominant mixed air and fluid containing collection measuring at least 18.0 x 4.6 cm (image 20, series 2), the approximately 11.5 x 5.3 cm complex fluid collection within the left mid hemiabdomen (image 49, series 2), the approximately 9.4 x 2.6 cm complex fluid collection within the midline of the right mid abdomen at the level of the mesenteric root (image 64, series 2 as well as the ill-defined approximately 3.2 x 2.6 cm fluid collection within the left hemipelvis (image 94, series 3). The procedure was planned. A timeout was performed prior to the initiation of the procedure. The skin overlying the operative sites were prepped and draped in usual sterile fashion. The overlying soft tissues were anesthetized with 1% lidocaine with epinephrine. Each collection was targeted with an 18 gauge trocar needle. If purulent fluid was aspirated, then a short Amplatz wire was coiled within each collection. Ultimately each collection except the collection within  the left hemipelvis was successfully cannulated with a short Amplatz wire allowing placement of 14 French drainage catheters at all locations. The collection within the left lower pelvis was not amenable to percutaneous drainage catheter placement secondary to inability to coil a wire within the collection however proximally 5 cc of bloody fluid  was aspirated from this indeterminate fluid collection. The collection within the perihepatic space yielded 100 cc of blood tinged fluid. The collection within the right lower abdominal quadrant yielded 50 cc of purulent fluid while the collection within the left mid abdomen yielded 125 cc of purulent fluid. A representative aspirated sample from the collection with the left mid hemiabdomen was capped and sent to the laboratory for analysis All drainage catheters were flushed with a small amount of saline and all drainage catheters were connected to gravity bags and secured in place with interrupted sutures and StatLock devices. Dressings were applied. While uncomfortable lying supine on the CT gantry, the patient tolerated the procedure well without immediate postprocedural complication. IMPRESSION: 1. Successful CT-guided placement of a 14 French drainage catheter within the complex collection within the left mid hemiabdomen yielding 125 cc of purulent fluid. A representative sample of aspirated fluid from this collection was capped and sent to the laboratory for analysis. 2. Successful CT-guided placement of 14 French perihepatic drainage catheter yielding 100 cc of blood tinged fluid. 3. Successful CT-guided placement of a 14 French drainage catheter within the complex fluid collection within the right lower abdomen at the level of the mesenteric root yielding 50 cc of purulent fluid. 4. Successful CT-guided aspiration of approximately 5 cc of blood tinged fluid from the indeterminate fluid collection within the left hemipelvis, not amenable to drainage catheter placement given inability to coil a wire within this poorly defined collection/hematoma. Electronically Signed   By: Simonne Come M.D.   On: 11/08/2020 14:42   CT IMAGE GUIDED DRAINAGE BY PERCUTANEOUS CATHETER  Result Date: 11/08/2020 INDICATION: History of necrotizing pancreatitis, now with multiple complex fluid collections within the abdomen and  pelvis despite operative debridement and placement of two large bore surgical peripancreatic drainage catheters. Request made for CT-guided aspiration(s) versus drainage catheter(s) placement for infection source control purposes. EXAM: 1. CT-GUIDED PERCUTANEOUS DRAINAGE CATHETER PLACEMENT X3 2. CT-GUIDED ASPIRATION OF LEFT LOWER ABDOMINAL/PELVIC FLUID COLLECTION COMPARISON:  CT abdomen pelvis-11/06/2020; 10/29/2020; 11/03/2020 MEDICATIONS: The patient is currently admitted to the hospital and receiving intravenous antibiotics. The antibiotics were administered within an appropriate time frame prior to the initiation of the procedure. ANESTHESIA/SEDATION: Moderate (conscious) sedation was employed during this procedure. A total of Versed 1 mg and Fentanyl 25 mcg was administered intravenously. Moderate Sedation Time: 65 minutes. The patient's level of consciousness and vital signs were monitored continuously by radiology nursing throughout the procedure under my direct supervision. CONTRAST:  None COMPLICATIONS: None immediate. PROCEDURE: Informed written consent was obtained from the patient after a discussion of the risks, benefits and alternatives to treatment. The patient was placed supine on the CT gantry and a pre procedural CT was performed re-demonstrating the known abscess/fluid collection within the perihepatic space with dominant mixed air and fluid containing collection measuring at least 18.0 x 4.6 cm (image 20, series 2), the approximately 11.5 x 5.3 cm complex fluid collection within the left mid hemiabdomen (image 49, series 2), the approximately 9.4 x 2.6 cm complex fluid collection within the midline of the right mid abdomen at the level of the mesenteric root (image 64, series 2 as well as the ill-defined approximately  3.2 x 2.6 cm fluid collection within the left hemipelvis (image 94, series 3). The procedure was planned. A timeout was performed prior to the initiation of the procedure. The skin  overlying the operative sites were prepped and draped in usual sterile fashion. The overlying soft tissues were anesthetized with 1% lidocaine with epinephrine. Each collection was targeted with an 18 gauge trocar needle. If purulent fluid was aspirated, then a short Amplatz wire was coiled within each collection. Ultimately each collection except the collection within the left hemipelvis was successfully cannulated with a short Amplatz wire allowing placement of 14 French drainage catheters at all locations. The collection within the left lower pelvis was not amenable to percutaneous drainage catheter placement secondary to inability to coil a wire within the collection however proximally 5 cc of bloody fluid was aspirated from this indeterminate fluid collection. The collection within the perihepatic space yielded 100 cc of blood tinged fluid. The collection within the right lower abdominal quadrant yielded 50 cc of purulent fluid while the collection within the left mid abdomen yielded 125 cc of purulent fluid. A representative aspirated sample from the collection with the left mid hemiabdomen was capped and sent to the laboratory for analysis All drainage catheters were flushed with a small amount of saline and all drainage catheters were connected to gravity bags and secured in place with interrupted sutures and StatLock devices. Dressings were applied. While uncomfortable lying supine on the CT gantry, the patient tolerated the procedure well without immediate postprocedural complication. IMPRESSION: 1. Successful CT-guided placement of a 14 French drainage catheter within the complex collection within the left mid hemiabdomen yielding 125 cc of purulent fluid. A representative sample of aspirated fluid from this collection was capped and sent to the laboratory for analysis. 2. Successful CT-guided placement of 14 French perihepatic drainage catheter yielding 100 cc of blood tinged fluid. 3. Successful  CT-guided placement of a 14 French drainage catheter within the complex fluid collection within the right lower abdomen at the level of the mesenteric root yielding 50 cc of purulent fluid. 4. Successful CT-guided aspiration of approximately 5 cc of blood tinged fluid from the indeterminate fluid collection within the left hemipelvis, not amenable to drainage catheter placement given inability to coil a wire within this poorly defined collection/hematoma. Electronically Signed   By: Simonne Come M.D.   On: 11/08/2020 14:42    Labs:  CBC: Recent Labs    11/08/20 0557 11/09/20 0500 11/10/20 0430 11/11/20 0415  WBC 13.0* 13.4* 15.3* 18.2*  HGB 8.3* 8.1* 8.0* 8.3*  HCT 25.9* 25.8* 26.0* 26.5*  PLT 326 350 366 368    COAGS: Recent Labs    10/15/2020 2102 10/22/20 0357 10/22/20 0826  INR 2.1* 2.1* 2.0*    BMP: Recent Labs    11/08/20 0557 11/09/20 0450 11/10/20 0430 11/11/20 0415  NA 135 133* 130* 134*  K 4.0 3.5 3.6 3.8  CL 97* 96* 96* 98  CO2 GLUCOSE 172* 180* 147* 214*  BUN CALCIUM 7.9* 7.9* 7.8* 7.9*  CREATININE 0.70 0.61 0.52* 0.53*  GFRNONAA >60 >60 >60 >60    LIVER FUNCTION TESTS: Recent Labs    11/06/20 0618 11/07/20 0440 11/08/20 0557 11/09/20 0450 11/10/20 0430 11/11/20 0415  BILITOT 0.6 0.7 0.8  --   --  0.8  AST --   --  26  ALT --   --  14  ALKPHOS 89 82 76  --   --  91  PROT 5.9* 5.9* 6.3*  --   --  5.6*  ALBUMIN 1.3* 1.9* 1.7* 1.6* 1.5* 1.6*    Assessment and Plan:  55 y.o. male inpatient. History of  alcoholism, HTN, pancreatitis, obesity.Presented to the ED at [facility] with abdominal pain. 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 S/p ex lap with debridement of infected necrotic pancreatitis and placement of  2 peripancreatic surgical drains on 9.9.22.  Found to have abdominal and pelvic fluid collections. IR placed 3 abscess drains on 9.26.22. a 14 Fr left mid hemi-abdomen with aspiration of 125 ml of purulent  output. A 14 Fr perihepatic abscess drain with aspiration of 100 ml's of serosanguinous output. A 14 Fr right lower abdominal drain at the level of the mesenteric root with 50 ml of purulent fluid noted.   Drain cultures grew enterococcus faecalis. WBC is 18.2 and up-trending.   Drain Location: Drain 1 - LUQ -medial  Size: Fr size: 14 Fr Date of placement: 9.26.22  Currently to: Drain collection device: gravity Output is yellow- tan  Drain Location: Drain 2 - RUQ - perihepatic Size: Fr size: 14 Fr Date of placement: 9.26.22  Currently to: Drain collection device: gravity Output is orange tan and purulent  Drain Location: Drain 3-  RLQ Size: Fr size: 14 Fr Date of placement: 9.26.22  Currently to: Drain collection device: gravity Output is tan and purulent  24 hour output:  Output by Drain (mL) 11/10/20 0701 - 11/10/20 1900 11/10/20 1901 - 11/11/20 0700 11/11/20 0701 - 11/11/20 1900 11/11/20 1901 - 11/12/20 0700 11/12/20 0701 - 11/12/20 1350  Closed System Drain 1 Right Abdomen Bulb (JP) 32 Fr. 5 50 20 80   Closed System Drain 2 Right Abdomen Bulb (JP) 28 Fr. 72 175 110 110   Closed System Drain 1 Left;Medial;Lateral LUQ Other (Comment) 14 Fr. Closed System Drain 2 Lateral RUQ Other (Comment) 14 Fr. 6   0   Closed System Drain 3 Right;Lateral RLQ Other (Comment) 14 Fr. 10   50     Interval imaging/drain manipulation:  None since drain placement  Current examination: All 3 IR drains were sluggish to flush. The drain were all lavaged at beside with rapid return of output.  Insertion site unremarkable. Suture and stat lock in place. Dressed appropriately.   Plan: Continue TID flushes with 5 cc NS. Record output Q shift. Dressing changes QD or PRN if soiled.  Call IR APP or on call IR MD if difficulty flushing or sudden change in drain output.  Repeat imaging/possible drain injection once output < 10 mL/QD (excluding flush material.)  Discharge planning: Please  contact IR APP or on call IR MD prior to patient d/c to ensure appropriate follow up plans are in place. Typically patient will follow up with IR clinic 10-14 days post d/c for repeat imaging/possible drain injection. IR scheduler will contact patient with date/time of appointment. Patient will need to flush drain QD with 5 cc NS, record output QD, dressing changes every 2-3 days or earlier if soiled.   IR will continue to follow - please call with questions or concerns.  Electronically Signed: Alene Mires, NP 11/12/2020, 1:45 PM   I spent a total of 15 Minutes at the patient's bedside AND on the patient's hospital floor or unit, greater than 50% of which was counseling/coordinating care for abscess drain X 3.

## 2020-11-12 NOTE — Progress Notes (Signed)
Personal belongings packed in bags. Pt stated he wanted his money,phone, and charger in a bag. Pt being transferred to [address].

## 2020-11-12 NOTE — Progress Notes (Signed)
Inpatient Rehab Admissions Coordinator:  Pt. Is not medically appropriate at this time. Family is only able to provide supervision at d/c and Pt. Is likely to have significant nursing needs (will have coretrak for 6-8+ weeks, drains, wound care) that are likely to persist beyond a ~2 week stay on CIR. Pt.. likely needs LTACH.   Megan Salon, MS, CCC-SLP Rehab Admissions Coordinator  [phone] (celll) [phone] (office)

## 2020-11-12 NOTE — Progress Notes (Signed)
PROGRESS NOTE  Leonides Minder WGN:[id number] DOB: 04/29/1965   PCP: Pcp, No  Patient is from: Home  DOA: 10/24/2020 LOS: 22  Chief complaints:  Chief Complaint  Patient presents with   Altered Mental Status     Brief Narrative / Interim history: 55 year old M with PMH of DM-2, EtOH abuse, pancreatitis and tobacco use presenting with altered mental status and abdominal pain.  Per wife, started presentation to healthcare facilities ([facility], [place] and finally [place]) with abdominal pain.  In ED, CTH negative.  CT a/p with pneumoperitoneum without clear sight of perforation. Started on Vanc and Zosyn. He underwent ex lap on 9/9 and found to have large necrotic fluid surrounding pancreas concerning for necrotic infected pancreatitis but no bowel perforation.  He had 2 drains placed.  No fluid culture sent.  He was transferred to ICU postop.  He was extubated on 9/12 to Venturi mask.  Came off sedation on 9/13 and transferred to [facility] service on 9/15.  Hospital course complicated by ARF in the setting of possible aspiration pneumonia, exudative left pleural effusion, ABLA requiring transfusion, AME/delirium and severe malnutrition from prolonged n.p.o. requiring TPN. Eventually had cortrack and started on TF, and weaned off TPN. However, he continued to spike intermittent fever and tachycardia despite antibiotics.  ID consulted and changed antibiotics to IV Zosyn on 9/24. Repeat CT a/p showed perihepatic fluid (new).  IR placed additional 3 drains on 9/26.  Fluid culture with Enterococcus faecalis and few Clostridium.   Subjective: Seen and examined earlier this morning.  No major events overnight of this morning.  No complaints but not a great historian.  He is awake but not alert.  He responds no to pain or shortness of breath.  He is oriented to self, place and month.  Follows commands.  Does not appear to be in distress.  Objective: Vitals:   11/11/20 2318 11/12/20 0306 11/12/20  0847 11/12/20 1516  BP: 125/81 133/79 137/85 134/83  Pulse: (!) 103 (!) 101 97 (!) 106  Resp: Temp: 98.4 F (36.9 C) 97.8 F (36.6 C) 98.3 F (36.8 C) 99.5 F (37.5 C)  TempSrc: Oral Oral Oral Oral  SpO2: 95% 90% 93% 91%  Weight:      Height:        Intake/Output Summary (Last 24 hours) at 11/12/2020 1619 Last data filed at 11/12/2020 1522 Gross per 24 hour  Intake 1008.95 ml  Output 845 ml  Net 163.95 ml   Filed Weights   11/04/20 0603 11/08/20 0500 11/11/20 0500  Weight: 100.2 kg 96.1 kg 98.3 kg    Examination:  GENERAL: No apparent distress.  Nontoxic. HEENT: MMM.  Vision and hearing grossly intact.  NECK: Supple.  No apparent JVD.  RESP: 93% on 4 L.  No IWOB.  Fair aeration bilaterally. CVS:  RRR. Heart sounds normal.  ABD/GI/GU: BS+. Abd soft.  Some discomfort with palpation.  Dressing DCI.  Multiple drains, most with purulence.  Rectal tube in place. MSK/EXT:  Moves extremities. No apparent deformity. No edema.  SKIN: no apparent skin lesion or wound NEURO: Awake but not quite alert.  Oriented to self, place and months.  Follows commands.  No apparent focal neuro deficit. PSYCH: Calm. Normal affect.   Procedures:  9/9-ex lap with drain placement by Dr. Dr. Sheliah Hatch 9/9-9/13-intubation and mechanical ventilation 9/26-drain placement by IR  Microbiology summarized: 9/8-COVID-19 and influenza PCR nonreactive. 9/9-MRSA PCR screen negative. 9/8 and 9/9-blood cultures NGTD 9/15-sputum culture with STENOTROPHOMONAS MALTOPHILIA  sensitive to Levaquin and Bactrim 9/21-blood cultures NGTD. 9/26-abdominal drain culture with rare Enterococcus faecalis and few Clostridium clostridioforme  Assessment & Plan: Sepsis due to infected necrotizing pancreatitis with pneumoperitoneum and possible aspiration pneumonia -Manage individual problems as below.    Infected necrotizing pancreatitis with pneumoperitoneum, intra-abdominal and retroperitoneal fluid: -S/p  ex-lap and drain placement by Dr. Drexel Iha on 9/9.  Fluid culture was not sent -Repeat CT a/p  on 9/17 with large amount of residual gas and fluid dissecting through the retroperitoneum -Repeat CT a/p over 9/24 with increased perihepatic fluid collection. -9/26- three additional drains by IR.  Fluid culture with Enterococcus faecalis. -9/8-Vanco/Zosyn-9/9-Zyvox/Zosyn-9/14 -meropenem-9/19-Levaquin/Flagyl-9/24 Zosyn- 9/27- Unasyn-9/30 -9/30 Zosyn>>> ID managing -Surgical wound care and pain management per general surgery. -Continue postpyloric tube feed per general surgery. -IS/OOB/PT/OT   Acute respiratory failure with hypoxia/possible aspiration pneumonia/atelectasis: CXR with hypoinflation and retrocardiac opacity most likely from atelectasis versus infectious process.  Increased oxygen requirement.  Currently on 8 L by HFNC to maintain saturation in low 80s. -ETT postop 9/9-10/2011.  -Aggressive pulmonary toileting, IS, OOB, PT/OT.  -Antibiotics as above.  Anasarca/edema-likely due to malnutrition and hypoalbuminemia.  BNP 160.  TTE on 9/15 basically normal.  Anasarca resolved with IV Lasix and IV albumin -Closely monitor fluid status, renal functions and electrolytes  Acute toxic-metabolic encephalopathy/delirium: Encephalopathy likely from severe sepsis and ICU delirium.  -Check basic encephalopathy labs in the morning  -Reorientation and delirium precautions.   -Continue IV Haldol as needed -Fall and aspiration precautions    Ileus: Seems to have resolved.  Diarrhea: Likely from TF.  He is also at risk for C. Difficile -Continue rectal tube   Acute hepatitis: Likely from TPN.  Resolved.   Alcohol abuse: Out of the window for withdrawal complications.   -Continue thiamine and folate   Acute Blood Loss Anemia, postsurgical: S/p 1 unit of pRBCs on 9/10 with stabilization in hemoglobin since.  Recent Labs    10/31/20 0352 11/02/20 0925 11/04/20 0847 11/05/20 0345  11/06/20 0620 11/08/20 0557 11/09/20 0500 11/10/20 0430 11/11/20 0415 11/12/20 1142  HGB 8.4* 8.6* 7.8* 8.0* 8.5* 8.3* 8.1* 8.0* 8.3* 8.9*  -Continue monitoring  Controlled DM-2 with hyperglycemia: A1c 6.8% on admission. Recent Labs  Lab 11/11/20 2318 11/12/20 0322 11/12/20 0935 11/12/20 1245 11/12/20 1611  GLUCAP 182* 136* 240* 257* 140*  -Continue SSI-high every 4 hours -Increase tube coverage from 4 to 6 units -Increase basal insulin from 15 to 20 units -Further adjustment as appropriate   Hypokalemia/hypomagnesemia/hypophosphatemia: Resolved.   Dysphagia: Cleared by SLP -Remains n.p.o. pending decision by general surgery  Debility/physical deconditioning -Continue PT/OT  Leukocytosis/bandemia: Likely due to infectious process. -Antibiotic as above -Continue monitoring  Severe protein calorie malnutrition in the setting of n.p.o. as evidenced by anasarca, hypoalbuminemia Body mass index is 32 kg/m. Nutrition Problem: Severe Malnutrition Etiology: acute illness (infected necrotic pancreatitis) Signs/Symptoms: moderate fat depletion, moderate muscle depletion, energy intake < or equal to 50% for > or equal to 5 days, percent weight loss (8.4% weight loss in 1.5 months) Percent weight loss: 8.4 % (1.5 months) Interventions: TPN, Tube feeding, Refer to RD note for recommendations   DVT prophylaxis:  heparin injection 5,000 Units Start: 10/22/20 1400 Place and maintain sequential compression device Start: 2020/11/02 2202 SCDs Start: 02-Nov-2020 2145  Code Status: Full code Family Communication: Patient and RN.  None at bedside today. Level of care: Telemetry Surgical Status is: Inpatient  Remains inpatient appropriate because:Ongoing active pain requiring inpatient pain management, IV treatments appropriate due  to intensity of illness or inability to take PO, and Inpatient level of care appropriate due to severity of illness  Dispo: The patient is from: Home               Anticipated d/c is to: [facility]              Patient currently is not medically stable to d/c.   Difficult to place patient No       Consultants:  General surgery Infectious disease Interventional radiology   Sch Meds:  Scheduled Meds:  chlorhexidine  15 mL Mouth Rinse BID   Chlorhexidine Gluconate Cloth  6 each Topical Q0600   feeding supplement (PROSource TF)  45 mL Per Tube TID   fentaNYL  1 patch Transdermal Q72H   fluticasone furoate-vilanterol  1 puff Inhalation Daily   And   umeclidinium bromide  1 puff Inhalation Daily   folic acid  1 mg Per Tube Daily   heparin  5,000 Units Subcutaneous Q8H   insulin aspart  0-20 Units Subcutaneous Q4H   insulin aspart  6 Units Subcutaneous Q4H   [START ON 11/13/2020] insulin glargine-yfgn  20 Units Subcutaneous Daily   mouth rinse  15 mL Mouth Rinse q12n4p   pantoprazole (PROTONIX) IV  40 mg Intravenous Q24H   sodium chloride flush  10-40 mL Intracatheter Q12H   sodium chloride flush  10-40 mL Intracatheter Q12H   sodium chloride flush  5 mL Intravenous Q8H   sodium chloride flush  5 mL Intravenous Q8H   sodium chloride flush  5 mL Intravenous Q8H   thiamine  100 mg Per Tube Daily   Continuous Infusions:  sodium chloride Stopped (10/31/20 0131)   feeding supplement (VITAL 1.5 CAL) 1,000 mL (11/12/20 1535)   methocarbamol (ROBAXIN) IV 500 mg (11/12/20 1018)   piperacillin-tazobactam (ZOSYN)  IV     PRN Meds:.sodium chloride, acetaminophen (TYLENOL) oral liquid 160 mg/5 mL, acetaminophen, albuterol, docusate, haloperidol lactate, HYDROmorphone (DILAUDID) injection, ondansetron (ZOFRAN) IV, polyethylene glycol, sodium chloride flush, sodium chloride flush  Antimicrobials: Anti-infectives (From admission, onward)    Start     Dose/Rate Route Frequency Ordered Stop   11/12/20 1900  piperacillin-tazobactam (ZOSYN) IVPB 3.375 g        3.375 g 12.5 mL/hr over 240 Minutes Intravenous Every 8 hours 11/12/20 1538     11/09/20  1730  Ampicillin-Sulbactam (UNASYN) 3 g in sodium chloride 0.9 % 100 mL IVPB  Status:  Discontinued        3 g 200 mL/hr over 30 Minutes Intravenous Every 6 hours 11/09/20 1617 11/12/20 1538   11/06/20 1545  piperacillin-tazobactam (ZOSYN) IVPB 3.375 g  Status:  Discontinued        3.375 g 12.5 mL/hr over 240 Minutes Intravenous Every 8 hours 11/06/20 1453 11/09/20 1616   11/01/20 0900  levofloxacin (LEVAQUIN) IVPB 750 mg  Status:  Discontinued        750 mg 100 mL/hr over 90 Minutes Intravenous Every 24 hours 11/01/20 0824 11/06/20 1408   11/01/20 0900  metroNIDAZOLE (FLAGYL) IVPB 500 mg  Status:  Discontinued        500 mg 100 mL/hr over 60 Minutes Intravenous Every 12 hours 11/01/20 0824 11/06/20 1408   10/29/20 2200  meropenem (MERREM) 1 g in sodium chloride 0.9 % 100 mL IVPB  Status:  Discontinued        1 g 200 mL/hr over 30 Minutes Intravenous Every 8 hours 10/29/20 1428 11/01/20 0824   10/28/20 1615  meropenem (MERREM) 2 g in sodium chloride 0.9 % 100 mL IVPB  Status:  Discontinued        2 g 200 mL/hr over 30 Minutes Intravenous Every 8 hours 10/28/20 1515 10/29/20 1428   10/28/20 1445  metroNIDAZOLE (FLAGYL) IVPB 500 mg  Status:  Discontinued        500 mg 100 mL/hr over 60 Minutes Intravenous Every 12 hours 10/28/20 1348 10/28/20 1505   10/25/20 2300  fluconazole (DIFLUCAN) IVPB 400 mg        400 mg 100 mL/hr over 120 Minutes Intravenous Every 24 hours 10/25/20 0742 10/29/20 0718   10/24/20 2300  fluconazole (DIFLUCAN) IVPB 200 mg  Status:  Discontinued        200 mg 100 mL/hr over 60 Minutes Intravenous Every 24 hours 10/24/20 0943 10/25/20 0742   10/22/20 1800  vancomycin (VANCOREADY) IVPB 1500 mg/300 mL  Status:  Discontinued        1,500 mg 150 mL/hr over 120 Minutes Intravenous Every 24 hours 10/27/2020 2225 10/22/20 0759   10/22/20 0900  linezolid (ZYVOX) IVPB 600 mg  Status:  Discontinued        600 mg 300 mL/hr over 60 Minutes Intravenous Every 12 hours 10/22/20 0800  10/27/20 0852   10/22/20 0300  piperacillin-tazobactam (ZOSYN) IVPB 3.375 g  Status:  Discontinued        3.375 g 12.5 mL/hr over 240 Minutes Intravenous Every 8 hours 10/27/2020 2225 10/28/20 1514   11/02/2020 2335  metroNIDAZOLE (FLAGYL) IVPB 500 mg  Status:  Discontinued        500 mg 100 mL/hr over 60 Minutes Intravenous Every 12 hours 10/30/2020 2142 10/22/20 0747   10/26/2020 2248  fluconazole (DIFLUCAN) IVPB 400 mg  Status:  Discontinued        400 mg 100 mL/hr over 120 Minutes Intravenous Every 24 hours 10/30/2020 2142 10/24/20 0943   10/16/2020 1748  vancomycin (VANCOREADY) IVPB 2000 mg/400 mL        2,000 mg 200 mL/hr over 120 Minutes Intravenous  Once 10/18/2020 1706 10/17/2020 2113   10/30/2020 1715  piperacillin-tazobactam (ZOSYN) IVPB 3.375 g        3.375 g 100 mL/hr over 30 Minutes Intravenous  Once 11/11/2020 1706 10/24/2020 1903        I have personally reviewed the following labs and images: CBC: Recent Labs  Lab 11/06/20 0620 11/08/20 0557 11/09/20 0500 11/10/20 0430 11/11/20 0415 11/12/20 1142  WBC 10.7* 13.0* 13.4* 15.3* 18.2* 17.3*  NEUTROABS 8.0*  --   --   --   --   --   HGB 8.5* 8.3* 8.1* 8.0* 8.3* 8.9*  HCT 27.1* 25.9* 25.8* 26.0* 26.5* 28.8*  MCV 105.4* 100.4* 102.4* 102.8* 101.1* 103.2*  PLT 235 326 350 366 368 389   BMP &GFR Recent Labs  Lab 11/07/20 0440 11/08/20 0557 11/09/20 0450 11/09/20 0500 11/10/20 0430 11/11/20 0415  NA 136 135 133*  --  130* 134*  K 3.9 4.0 3.5  --  3.6 3.8  CL 100 97* 96*  --  96* 98  CO2 29 30 30   --  30 29  GLUCOSE 155* 172* 180*  --  147* 214*  BUN 16 16 13   --  9 6  CREATININE 0.65 0.70 0.61  --  0.52* 0.53*  CALCIUM 8.1* 7.9* 7.9*  --  7.8* 7.9*  MG 1.8 2.0  --  1.9 2.0 1.9  PHOS 3.2 4.0 3.2  --  2.7 2.5  Estimated Creatinine Clearance: 124.8 mL/min (A) (by C-G formula based on SCr of 0.53 mg/dL (L)). Liver & Pancreas: Recent Labs  Lab 11/06/20 0618 11/07/20 0440 11/08/20 0557 11/09/20 0450 11/10/20 0430  11/11/20 0415  AST 26 24 20   --   --  26  ALT 16 15 14   --   --  14  ALKPHOS 89 82 76  --   --  91  BILITOT 0.6 0.7 0.8  --   --  0.8  PROT 5.9* 5.9* 6.3*  --   --  5.6*  ALBUMIN 1.3* 1.9* 1.7* 1.6* 1.5* 1.6*   No results for input(s): LIPASE, AMYLASE in the last 168 hours. Recent Labs  Lab 11/07/20 0440  AMMONIA 38*   Diabetic: No results for input(s): HGBA1C in the last 72 hours. Recent Labs  Lab 11/11/20 2318 11/12/20 0322 11/12/20 0935 11/12/20 1245 11/12/20 1611  GLUCAP 182* 136* 240* 257* 140*   Cardiac Enzymes: Recent Labs  Lab 11/07/20 0440  CKTOTAL 10*   No results for input(s): PROBNP in the last 8760 hours. Coagulation Profile: No results for input(s): INR, PROTIME in the last 168 hours. Thyroid Function Tests: No results for input(s): TSH, T4TOTAL, FREET4, T3FREE, THYROIDAB in the last 72 hours.  Lipid Profile: No results for input(s): CHOL, HDL, LDLCALC, TRIG, CHOLHDL, LDLDIRECT in the last 72 hours.  Anemia Panel: No results for input(s): VITAMINB12, FOLATE, FERRITIN, TIBC, IRON, RETICCTPCT in the last 72 hours. Urine analysis:    Component Value Date/Time   COLORURINE AMBER (A) 11/09/2020 1549   APPEARANCEUR CLEAR 10/14/2020 1549   LABSPEC 1.015 11/05/2020 1549   PHURINE 6.0 10/25/2020 1549   GLUCOSEU NEGATIVE 11/02/2020 1549   HGBUR TRACE (A) 10/16/2020 1549   BILIRUBINUR SMALL (A) 10/15/2020 1549   KETONESUR 15 (A) 10/31/2020 1549   PROTEINUR NEGATIVE 11/12/2020 1549   NITRITE NEGATIVE 10/20/2020 1549   LEUKOCYTESUR NEGATIVE 11/12/2020 1549   Sepsis Labs: Invalid input(s): PROCALCITONIN, LACTICIDVEN  Microbiology: Recent Results (from the past 240 hour(s))  Culture, blood (Routine X 2) w Reflex to ID Panel     Status: None   Collection Time: 11/03/20 11:23 AM   Specimen: BLOOD  Result Value Ref Range Status   Specimen Description BLOOD LEFT ANTECUBITAL  Final   Special Requests   Final    BOTTLES DRAWN AEROBIC ONLY Blood Culture  adequate volume   Culture   Final    NO GROWTH 5 DAYS Performed at [facility], [address], [place], [address] [place]    Report Status 11/08/2020 FINAL  Final  Culture, blood (Routine X 2) w Reflex to ID Panel     Status: None   Collection Time: 11/03/20 11:24 AM   Specimen: BLOOD LEFT HAND  Result Value Ref Range Status   Specimen Description BLOOD LEFT HAND  Final   Special Requests   Final    BOTTLES DRAWN AEROBIC ONLY Blood Culture adequate volume   Culture   Final    NO GROWTH 5 DAYS Performed at [facility], [address], [place], [address] [place]    Report Status 11/08/2020 FINAL  Final  Culture, blood (routine x 2)     Status: None   Collection Time: 11/07/20 11:45 AM   Specimen: BLOOD LEFT ARM  Result Value Ref Range Status   Specimen Description BLOOD LEFT ARM  Final   Special Requests   Final    BOTTLES DRAWN AEROBIC AND ANAEROBIC Blood Culture adequate volume  Culture   Final    NO GROWTH 5 DAYS Performed at [facility], [address] [place], [place] [address]    Report Status 11/12/2020 FINAL  Final  Culture, blood (routine x 2)     Status: None   Collection Time: 11/07/20 11:51 AM   Specimen: BLOOD LEFT HAND  Result Value Ref Range Status   Specimen Description BLOOD LEFT HAND  Final   Special Requests   Final    BOTTLES DRAWN AEROBIC ONLY Blood Culture adequate volume   Culture   Final    NO GROWTH 5 DAYS Performed at [facility], [address], [place], [place] [address]    Report Status 11/12/2020 FINAL  Final  Aerobic/Anaerobic Culture w Gram Stain (surgical/deep wound)     Status: None (Preliminary result)   Collection Time: 11/08/20  1:47 PM   Specimen: Wound  Result Value Ref Range Status   Specimen Description WOUND LEFT ABDOMEN  Final   Special Requests CT DRAIN  Final   Gram Stain   Final    NO ORGANISMS SEEN SQUAMOUS EPITHELIAL CELLS PRESENT MODERATE WBC PRESENT,BOTH PMN AND MONONUCLEAR ABUNDANT GRAM  POSITIVE COCCI IN CLUSTERS RARE GRAM NEGATIVE RODS Performed at [facility], [address] [place], [place] [address]    Culture   Final    RARE ENTEROCOCCUS FAECALIS SUSCEPTIBILITIES TO FOLLOW FEW CLOSTRIDIUM CLOSTRIDIOFORME    Report Status PENDING  Incomplete    Radiology Studies: No results found.   Ankur Snowdon T. Paizleigh Wilds [facility]  If 7PM-7AM, please contact night-coverage [email or web address] 11/12/2020, 4:19 PM

## 2020-11-12 NOTE — Progress Notes (Addendum)
Progress Note  22 Days Post-Op  Subjective: No new complaints today.  Still with diarrhea.    Objective: Vital signs in last 24 hours: Temp:  [97.8 F (36.6 C)-99.8 F (37.7 C)] 98.3 F (36.8 C) (09/30 0847) Pulse Rate:  [97-110] 97 (09/30 0847) Resp:  [18-20] 18 (09/30 0847) BP: (122-147)/(75-85) 137/85 (09/30 0847) SpO2:  [88 %-96 %] 93 % (09/30 0847) Last BM Date: 11/12/20  Intake/Output from previous day: 09/29 0701 - 09/30 0700 In: 1009 [NG/GT:182; IV Piggyback:827] Out: 1260 [Urine:880; Drains:380] Intake/Output this shift: No intake/output data recorded.  PE: Gen:  Awake and alert, lying in bed Heart: RRR Pulm: normal effort on nasal cannula Abd: Distended but soft, diffusely TTP but minimal  RUQ surgical JP drains x2 with tan-grey thick drainage RUQ IR drain SS, slightly cloudy RLQ IR drain purulent LLQ IR serous-cloudy Midline wound pale pink, clean without dehiscence Laparoscopic sites c/d/i   Lab Results:  Recent Labs    11/10/20 0430 11/11/20 0415  WBC 15.3* 18.2*  HGB 8.0* 8.3*  HCT 26.0* 26.5*  PLT 366 368   BMET Recent Labs    11/10/20 0430 11/11/20 0415  NA 130* 134*  K 3.6 3.8  CL 96* 98  CO2 30 29  GLUCOSE 147* 214*  BUN 9 6  CREATININE 0.52* 0.53*  CALCIUM 7.8* 7.9*   PT/INR No results for input(s): LABPROT, INR in the last 72 hours. CMP     Component Value Date/Time   NA 134 (L) 11/11/2020 0415   K 3.8 11/11/2020 0415   CL 98 11/11/2020 0415   CO2 29 11/11/2020 0415   GLUCOSE 214 (H) 11/11/2020 0415   BUN 6 11/11/2020 0415   CREATININE 0.53 (L) 11/11/2020 0415   CALCIUM 7.9 (L) 11/11/2020 0415   PROT 5.6 (L) 11/11/2020 0415   ALBUMIN 1.6 (L) 11/11/2020 0415   AST 26 11/11/2020 0415   ALT 14 11/11/2020 0415   ALKPHOS 91 11/11/2020 0415   BILITOT 0.8 11/11/2020 0415   GFRNONAA >60 11/11/2020 0415   Lipase     Component Value Date/Time   LIPASE 25 2020/11/04 1506       Studies/Results: No results  found.  Anti-infectives: Anti-infectives (From admission, onward)    Start     Dose/Rate Route Frequency Ordered Stop   11/09/20 1730  Ampicillin-Sulbactam (UNASYN) 3 g in sodium chloride 0.9 % 100 mL IVPB        3 g 200 mL/hr over 30 Minutes Intravenous Every 6 hours 11/09/20 1617     11/06/20 1545  piperacillin-tazobactam (ZOSYN) IVPB 3.375 g  Status:  Discontinued        3.375 g 12.5 mL/hr over 240 Minutes Intravenous Every 8 hours 11/06/20 1453 11/09/20 1616   11/01/20 0900  levofloxacin (LEVAQUIN) IVPB 750 mg  Status:  Discontinued        750 mg 100 mL/hr over 90 Minutes Intravenous Every 24 hours 11/01/20 0824 11/06/20 1408   11/01/20 0900  metroNIDAZOLE (FLAGYL) IVPB 500 mg  Status:  Discontinued        500 mg 100 mL/hr over 60 Minutes Intravenous Every 12 hours 11/01/20 0824 11/06/20 1408   10/29/20 2200  meropenem (MERREM) 1 g in sodium chloride 0.9 % 100 mL IVPB  Status:  Discontinued        1 g 200 mL/hr over 30 Minutes Intravenous Every 8 hours 10/29/20 1428 11/01/20 0824   10/28/20 1615  meropenem (MERREM) 2 g in sodium chloride 0.9 %  100 mL IVPB  Status:  Discontinued        2 g 200 mL/hr over 30 Minutes Intravenous Every 8 hours 10/28/20 1515 10/29/20 1428   10/28/20 1445  metroNIDAZOLE (FLAGYL) IVPB 500 mg  Status:  Discontinued        500 mg 100 mL/hr over 60 Minutes Intravenous Every 12 hours 10/28/20 1348 10/28/20 1505   10/25/20 2300  fluconazole (DIFLUCAN) IVPB 400 mg        400 mg 100 mL/hr over 120 Minutes Intravenous Every 24 hours 10/25/20 0742 10/29/20 0718   10/24/20 2300  fluconazole (DIFLUCAN) IVPB 200 mg  Status:  Discontinued        200 mg 100 mL/hr over 60 Minutes Intravenous Every 24 hours 10/24/20 0943 10/25/20 0742   10/22/20 1800  vancomycin (VANCOREADY) IVPB 1500 mg/300 mL  Status:  Discontinued        1,500 mg 150 mL/hr over 120 Minutes Intravenous Every 24 hours 10/31/2020 2225 10/22/20 0759   10/22/20 0900  linezolid (ZYVOX) IVPB 600 mg   Status:  Discontinued        600 mg 300 mL/hr over 60 Minutes Intravenous Every 12 hours 10/22/20 0800 10/27/20 0852   10/22/20 0300  piperacillin-tazobactam (ZOSYN) IVPB 3.375 g  Status:  Discontinued        3.375 g 12.5 mL/hr over 240 Minutes Intravenous Every 8 hours 11/09/2020 2225 10/28/20 1514   10/23/2020 2335  metroNIDAZOLE (FLAGYL) IVPB 500 mg  Status:  Discontinued        500 mg 100 mL/hr over 60 Minutes Intravenous Every 12 hours 11/08/2020 2142 10/22/20 0747   10/25/2020 2248  fluconazole (DIFLUCAN) IVPB 400 mg  Status:  Discontinued        400 mg 100 mL/hr over 120 Minutes Intravenous Every 24 hours 11/02/2020 2142 10/24/20 0943   11/04/2020 1748  vancomycin (VANCOREADY) IVPB 2000 mg/400 mL        2,000 mg 200 mL/hr over 120 Minutes Intravenous  Once 10/17/2020 1706 11/05/2020 2113   10/24/2020 1715  piperacillin-tazobactam (ZOSYN) IVPB 3.375 g        3.375 g 100 mL/hr over 30 Minutes Intravenous  Once 10/20/2020 1706 11/10/2020 1903        Assessment/Plan POD 21 s/p ex lap with pancreatic debridement for infected necrotic pancreatitis by Dr. Sheliah Hatch on 9/9 - Superior drain under the mesocolon along with the head of the pancreas - Inferior drain under the mesocolon along the left upper abdomen and what is likely a position inferior to the pancreas - CT 9/24 w/ gas and fluid collections that are grossly stable in the retroperitoneum and are drained by JP tubes. Cont drains - CT 9/24 also with mildly increased amount of fluid is noted around the liver with an increased amount of air seen in the non dependent portion concerning for persistent bowel leak. - pt is having bowel function and IR drains placed 9/26 do not appear feculent. Low clinical suspicion for bowel leak - continue post-pyloric tube feeds - s/p IR drains 9/26. Cx with enterococcus faecalis, sensitivities pending  - BID WTD - Cont therapies, rec CIR - may need to consider LTACH with needs for continued post-pyloric TF - Pulm  toilet - Appreciate TRH's and ID's assistance w/ his care -WBC up yesterday to 18, recheck tomorrow   FEN: NPO, TF at goal rate ID: currently on unasyn VTE: SCDs, heparin subq. LE [place] neg for DVT's Foley - out, ext cath, voiding    -  Per primary -  VDRF - now extubated PNA w/ + resp cx's - cx's w/ stenotrophomonas maltophilia - finished course of Levaquin  B/l pleural effusions Abl anemia  ETOH use  AKI - resolved T2DM  LOS: 22 days    Letha Cape, [facility] 11/12/2020, 9:56 AM Please see Amion for pager number during day hours 7:00am-4:30pm

## 2020-11-12 NOTE — Care Management (Signed)
Patient transferred to [facility] 11/11/20. NCM spoke to [place] with [employer]. She has spoken to patient's wife and sister in law. [employer] will watch over the weekend and submit for insurance Monday.

## 2020-11-13 ENCOUNTER — Inpatient Hospital Stay (HOSPITAL_COMMUNITY): Payer: BC Managed Care – PPO

## 2020-11-13 ENCOUNTER — Inpatient Hospital Stay: Payer: Self-pay

## 2020-11-13 DIAGNOSIS — R6520 Severe sepsis without septic shock: Secondary | ICD-10-CM | POA: Diagnosis not present

## 2020-11-13 DIAGNOSIS — E8809 Other disorders of plasma-protein metabolism, not elsewhere classified: Secondary | ICD-10-CM | POA: Diagnosis not present

## 2020-11-13 DIAGNOSIS — K8591 Acute pancreatitis with uninfected necrosis, unspecified: Secondary | ICD-10-CM | POA: Diagnosis not present

## 2020-11-13 DIAGNOSIS — D539 Nutritional anemia, unspecified: Secondary | ICD-10-CM | POA: Diagnosis not present

## 2020-11-13 DIAGNOSIS — A419 Sepsis, unspecified organism: Secondary | ICD-10-CM | POA: Diagnosis not present

## 2020-11-13 LAB — COMPREHENSIVE METABOLIC PANEL
ALT: 15 U/L (ref 0–44)
AST: 22 U/L (ref 15–41)
Albumin: 1.6 g/dL — ABNORMAL LOW (ref 3.5–5.0)
Alkaline Phosphatase: 90 U/L (ref 38–126)
Anion gap: 7 (ref 5–15)
BUN: 10 mg/dL (ref 6–20)
CO2: 28 mmol/L (ref 22–32)
Calcium: 7.9 mg/dL — ABNORMAL LOW (ref 8.9–10.3)
Chloride: 102 mmol/L (ref 98–111)
Creatinine, Ser: 0.57 mg/dL — ABNORMAL LOW (ref 0.61–1.24)
GFR, Estimated: >60 mL/min (ref >60)
Glucose, Bld: 224 mg/dL — ABNORMAL HIGH (ref 70–99)
Potassium: 3.5 mmol/L (ref 3.5–5.1)
Sodium: 137 mmol/L (ref 135–145)
Total Bilirubin: 0.7 mg/dL (ref 0.3–1.2)
Total Protein: 5.8 g/dL — ABNORMAL LOW (ref 6.5–8.1)

## 2020-11-13 LAB — BRAIN NATRIURETIC PEPTIDE: B Natriuretic Peptide: 128.1 pg/mL — ABNORMAL HIGH (ref 0.0–100.0)

## 2020-11-13 LAB — CBC WITH DIFFERENTIAL/PLATELET
Abs Immature Granulocytes: 0 10*3/uL (ref 0.00–0.07)
Basophils Absolute: 0.1 10*3/uL (ref 0.0–0.1)
Basophils Relative: 1 %
Eosinophils Absolute: 0 10*3/uL (ref 0.0–0.5)
Eosinophils Relative: 0 %
HCT: 28.3 % — ABNORMAL LOW (ref 39.0–52.0)
Hemoglobin: 8.6 g/dL — ABNORMAL LOW (ref 13.0–17.0)
Lymphocytes Relative: 10 %
Lymphs Abs: 1.5 10*3/uL (ref 0.7–4.0)
MCH: 31.3 pg (ref 26.0–34.0)
MCHC: 30.4 g/dL (ref 30.0–36.0)
MCV: 102.9 fL — ABNORMAL HIGH (ref 80.0–100.0)
Monocytes Absolute: 0.9 10*3/uL (ref 0.1–1.0)
Monocytes Relative: 6 %
Neutro Abs: 12.4 10*3/uL — ABNORMAL HIGH (ref 1.7–7.7)
Neutrophils Relative %: 83 %
Platelets: 382 10*3/uL (ref 150–400)
RBC: 2.75 MIL/uL — ABNORMAL LOW (ref 4.22–5.81)
RDW: 19.3 % — ABNORMAL HIGH (ref 11.5–15.5)
WBC: 14.9 10*3/uL — ABNORMAL HIGH (ref 4.0–10.5)
nRBC: 0 /100 WBC
nRBC: 0.3 % — ABNORMAL HIGH (ref 0.0–0.2)

## 2020-11-13 LAB — GLUCOSE, CAPILLARY
Glucose-Capillary: 165 mg/dL — ABNORMAL HIGH (ref 70–99)
Glucose-Capillary: 185 mg/dL — ABNORMAL HIGH (ref 70–99)
Glucose-Capillary: 190 mg/dL — ABNORMAL HIGH (ref 70–99)
Glucose-Capillary: 204 mg/dL — ABNORMAL HIGH (ref 70–99)
Glucose-Capillary: 221 mg/dL — ABNORMAL HIGH (ref 70–99)
Glucose-Capillary: 225 mg/dL — ABNORMAL HIGH (ref 70–99)

## 2020-11-13 LAB — MAGNESIUM: Magnesium: 2 mg/dL (ref 1.7–2.4)

## 2020-11-13 LAB — AMMONIA: Ammonia: 155 umol/L — ABNORMAL HIGH (ref 9–35)

## 2020-11-13 LAB — LIPASE, BLOOD: Lipase: 29 U/L (ref 11–51)

## 2020-11-13 LAB — PHOSPHORUS: Phosphorus: 2.1 mg/dL — ABNORMAL LOW (ref 2.5–4.6)

## 2020-11-13 MED ORDER — INSULIN ASPART 100 UNIT/ML IJ SOLN
8.0000 [IU] | INTRAMUSCULAR | Status: DC
  Administered 2020-11-13 – 2020-11-15 (×7): 8 [IU] via SUBCUTANEOUS

## 2020-11-13 MED ORDER — IOHEXOL 300 MG/ML  SOLN
100.0000 mL | Freq: Once | INTRAMUSCULAR | Status: AC | PRN
  Administered 2020-11-13: 100 mL via INTRAVENOUS

## 2020-11-13 MED ORDER — ALPRAZOLAM 0.5 MG PO TABS
0.5000 mg | ORAL_TABLET | Freq: Three times a day (TID) | ORAL | Status: DC | PRN
  Administered 2020-11-13 – 2020-11-16 (×5): 0.5 mg via ORAL
  Filled 2020-11-13: qty 2
  Filled 2020-11-13 (×4): qty 1

## 2020-11-13 MED ORDER — SODIUM CHLORIDE 0.9% FLUSH
5.0000 mL | Freq: Three times a day (TID) | INTRAVENOUS | Status: DC
  Administered 2020-11-13 – 2020-11-18 (×15): 5 mL

## 2020-11-13 MED ORDER — LORAZEPAM 2 MG/ML IJ SOLN: 0.5000 mg | Freq: Four times a day (QID) | INTRAMUSCULAR | Status: DC | PRN

## 2020-11-13 MED ORDER — METHOCARBAMOL 1000 MG/10ML IJ SOLN
500.0000 mg | Freq: Three times a day (TID) | INTRAVENOUS | Status: DC | PRN
  Filled 2020-11-13 (×2): qty 5

## 2020-11-13 MED ORDER — QUETIAPINE FUMARATE 25 MG PO TABS
25.0000 mg | ORAL_TABLET | Freq: Every day | ORAL | Status: DC
  Administered 2020-11-13 – 2020-11-15 (×3): 25 mg
  Filled 2020-11-13 (×4): qty 1

## 2020-11-13 MED ORDER — SODIUM CHLORIDE 0.9% FLUSH
10.0000 mL | Freq: Two times a day (BID) | INTRAVENOUS | Status: DC
  Administered 2020-11-13: 10 mL
  Administered 2020-11-14: 20 mL
  Administered 2020-11-14 – 2020-11-27 (×12): 10 mL

## 2020-11-13 MED ORDER — SODIUM CHLORIDE 0.9% FLUSH: 10.0000 mL | INTRAVENOUS | Status: DC | PRN

## 2020-11-13 MED ORDER — LACTULOSE 10 GM/15ML PO SOLN
20.0000 g | Freq: Three times a day (TID) | ORAL | Status: DC
  Administered 2020-11-13 – 2020-11-17 (×10): 20 g
  Filled 2020-11-13 (×11): qty 30

## 2020-11-13 NOTE — Progress Notes (Signed)
Nurse Jacqualine Code, RN called [facility] for red MEWS score, respirations at 32 and sats low and pulse high. [facility], Luisa Hart and Dr. Alanda Slim to the floor.

## 2020-11-13 NOTE — Progress Notes (Signed)
Pt arrived on the unit with hand mitts in place. Double lumen PICC in place. Pt confused and lethargic. Pt receiving 5L O2 via nasal canula and O2 sats 89%. PT mews at 4. Spoke with pts wife [name].She reported  he had cell phone, glasses and tennis shoes at bedside. This nurse will attempt to locate these items. Mayford Knife RN

## 2020-11-13 NOTE — Progress Notes (Signed)
Pt pulling at o2 and tele lines taking gown off. Mittens placed for safety of lines.

## 2020-11-13 NOTE — Progress Notes (Signed)
This nurse was able to locate all of the personal items mentioned by the wife. While this nurse was in pt rm pt grabbed PICC with mitts on. This nurse stopped the pt from pulling them out.

## 2020-11-13 NOTE — Progress Notes (Signed)
Upon entering pt's room, pt stated " 2 plugs came out, I didn't have nothing to do with it."  Upon pulling the sheet back, pt had pulled out the 2 JP drains on his RLQ.  Occlusive dressing applied by Meriam Sprague, RN and Dr. Derrell Lolling notified.  No new orders received at this time. Will continue to monitor.  Hector [place]

## 2020-11-13 NOTE — Progress Notes (Signed)
Peripherally Inserted Central Catheter Placement  The IV Nurse has discussed with the patient and/or persons authorized to consent for the patient, the purpose of this procedure and the potential benefits and risks involved with this procedure.  The benefits include less needle sticks, lab draws from the catheter, and the patient may be discharged home with the catheter. Risks include, but not limited to, infection, bleeding, blood clot (thrombus formation), and puncture of an artery; nerve damage and irregular heartbeat and possibility to perform a PICC exchange if needed/ordered by physician.  Alternatives to this procedure were also discussed.  Bard Power PICC patient education guide, fact sheet on infection prevention and patient information card has been provided to patient /or left at bedside.  Telephone consent obtained from the wife by [facility].  PICC Placement Documentation  PICC Double Lumen 11/13/20 PICC Right Basilic 39 cm 0 cm (Active)  Indication for Insertion or Continuance of Line Poor Vasculature-patient has had multiple peripheral attempts or PIVs lasting less than 24 hours;Limited venous access - need for IV therapy >5 days (PICC only) 11/13/20 1746  Exposed Catheter (cm) 0 cm 11/13/20 1746  Site Assessment Clean;Dry;Intact 11/13/20 1746  Lumen #1 Status Flushed;Saline locked;Blood return noted 11/13/20 1746  Lumen #2 Status Flushed;Saline locked;Blood return noted 11/13/20 1746  Dressing Type Transparent 11/13/20 1746  Dressing Status Clean;Dry;Intact 11/13/20 1746  Antimicrobial disc in place? Yes 11/13/20 1746  Safety Lock Not Applicable 11/13/20 1746  Line Care Connections checked and tightened 11/13/20 1746  Line Adjustment (NICU/IV Team Only) No 11/13/20 1746  Dressing Intervention New dressing 11/13/20 1746  Dressing Change Due 11/20/20 11/13/20 1746       Elliot Dally 11/13/2020, 5:47 PM

## 2020-11-13 NOTE — Progress Notes (Signed)
[facility]   Reason for visit: Follow up on intraabdominal abscess  Interval History: no new culture growth, WBC 14.9, remains afebrile.  Does not feel short of breath.  No rash, no diarrhea.  He reports being tired.  Day 2 pip/tazo Day 24 total antibiotics  Physical Exam: Constitutional:  Vitals:   11/13/20 1307 11/13/20 1415  BP: (!) 152/90 (!) 142/90  Pulse: (!) 109 (!) 115  Resp: (!) 30 (!) 32  Temp: 99.9 F (37.7 C) 99 F (37.2 C)  SpO2: 90% (!) 86%   patient appears in NAD Eyes: anicteric HENT: no thrush Respiratory: increased respiratory rate, CTA B Cardiovascular: RRR GI: soft, nt, nd, no rebound, no guarding  Review of Systems: Constitutional: negative for fevers and chills Integument/breast: negative for rash  Lab Results  Component Value Date   WBC 14.9 (H) 11/13/2020   HGB 8.6 (L) 11/13/2020   HCT 28.3 (L) 11/13/2020   MCV 102.9 (H) 11/13/2020   PLT 382 11/13/2020    Lab Results  Component Value Date   CREATININE 0.57 (L) 11/13/2020   BUN 10 11/13/2020   NA 137 11/13/2020   K 3.5 11/13/2020   CL 102 11/13/2020   CO2 28 11/13/2020    Lab Results  Component Value Date   ALT 15 11/13/2020   AST 22 11/13/2020   ALKPHOS 90 11/13/2020     Microbiology: Recent Results (from the past 240 hour(s))  Culture, blood (routine x 2)     Status: None   Collection Time: 11/07/20 11:45 AM   Specimen: BLOOD LEFT ARM  Result Value Ref Range Status   Specimen Description BLOOD LEFT ARM  Final   Special Requests   Final    BOTTLES DRAWN AEROBIC AND ANAEROBIC Blood Culture adequate volume   Culture   Final    NO GROWTH 5 DAYS Performed at [facility], [address] [place], [place] [address]    Report Status 11/12/2020 FINAL  Final  Culture, blood (routine x 2)     Status: None   Collection Time: 11/07/20 11:51 AM   Specimen: BLOOD LEFT HAND  Result Value Ref Range Status   Specimen Description BLOOD LEFT HAND   Final   Special Requests   Final    BOTTLES DRAWN AEROBIC ONLY Blood Culture adequate volume   Culture   Final    NO GROWTH 5 DAYS Performed at [facility], [address], [place], [place] [address]    Report Status 11/12/2020 FINAL  Final  Aerobic/Anaerobic Culture w Gram Stain (surgical/deep wound)     Status: None (Preliminary result)   Collection Time: 11/08/20  1:47 PM   Specimen: Wound  Result Value Ref Range Status   Specimen Description WOUND LEFT ABDOMEN  Final   Special Requests CT DRAIN  Final   Gram Stain   Final    NO SQUAMOUS EPITHELIAL CELLS PRESENT MODERATE WBC PRESENT,BOTH PMN AND MONONUCLEAR ABUNDANT GRAM POSITIVE COCCI IN CLUSTERS RARE GRAM NEGATIVE RODS Performed at [facility], [address], [place], [place] [address]    Culture   Final    RARE ENTEROCOCCUS FAECALIS Sent to [facility] for further susceptibility testing. FEW CLOSTRIDIUM CLOSTRIDIOFORME    Report Status PENDING  Incomplete    Impression/Plan:  1. Intra abdominal abscess - culture growth with Enterococcus, Clostridium and on pip/tazo which will cover both along with being broad coverage.   Overall feeling poorly and agree with  repeating CT scan.  No changes.    2.    Increased respirations - he does not feel short of breath and no wheezes or concerns.  CXR done  3.  Increased ammonia - lactulose started and will continue to monitor

## 2020-11-13 NOTE — Progress Notes (Addendum)
PROGRESS NOTE  Paul Chambers MKL:[id number] DOB: 06/25/1965   PCP: Pcp, No  Patient is from: Home  DOA: 11/08/2020 LOS: 23  Chief complaints:  Chief Complaint  Patient presents with   Altered Mental Status     Brief Narrative / Interim history: 55 year old M with PMH of DM-2, EtOH abuse, pancreatitis and tobacco use presenting with altered mental status and abdominal pain.  Per wife, started presentation to healthcare facilities ([facility], [facility] and finally [place]) with abdominal pain.  In ED, CTH negative.  CT a/p with pneumoperitoneum without clear sight of perforation. Started on Vanc and Zosyn. He underwent ex lap on 9/9 and found to have large necrotic fluid surrounding pancreas concerning for necrotic infected pancreatitis but no bowel perforation.  He had 2 drains placed.  No fluid culture sent.  He was transferred to ICU postop.  He was extubated on 9/12 to Venturi mask.  Came off sedation on 9/13 and transferred to [facility] service on 9/15.  Hospital course complicated by ARF in the setting of possible aspiration pneumonia, exudative left pleural effusion, ABLA requiring transfusion, AME/delirium and severe malnutrition from prolonged n.p.o. requiring TPN. Eventually had cortrack and started on TF, and weaned off TPN. However, he continued to spike intermittent fever and tachycardia despite antibiotics.  ID consulted and changed antibiotics to IV Zosyn on 9/24. Repeat CT a/p showed perihepatic fluid (new).  IR placed additional 3 drains on 9/26.  Fluid culture with Enterococcus faecalis and few Clostridium.    Subjective: Seen and examined earlier this morning this afternoon.  Had confusion pulling on his NG tube and telemetry wires.  Mittens placed.  He says he does not feel well but could not specify or elaborate.  He denies pain or shortness of breath although he seems to be breathing fast.  He is tachycardic to 110s and tachypneic to 30s.  Saturating in upper 80s to low 90s  on 4 L.   Objective: Vitals:   11/13/20 1118 11/13/20 1155 11/13/20 1307 11/13/20 1415  BP: 140/86 (!) 148/89 (!) 152/90 (!) 142/90  Pulse: (!) 108 (!) 109 (!) 109 (!) 115  Resp: (!) 22 (!) 26 (!) 30 (!) 32  Temp: 99.6 F (37.6 C) 98.6 F (37 C) 99.9 F (37.7 C) 99 F (37.2 C)  TempSrc: Oral Oral Oral Oral  SpO2: 95% 90% 90% (!) 86%  Weight:      Height:        Intake/Output Summary (Last 24 hours) at 11/13/2020 1454 Last data filed at 11/13/2020 1300 Gross per 24 hour  Intake 1728.81 ml  Output 655 ml  Net 1073.81 ml   Filed Weights   11/04/20 0603 11/08/20 0500 11/11/20 0500  Weight: 100.2 kg 96.1 kg 98.3 kg    Examination:  GENERAL: Seems uncomfortable.  HEENT: MMM.  Vision and hearing grossly intact.  NECK: Supple.  No apparent JVD.  RESP: 90% on 5 L.  Tachypneic.  No IWOB.  Fair aeration bilaterally but limited exam. CVS:  RRR. Heart sounds normal.  ABD/GI/GU: BS+. Abd slightly distended.  Mild tenderness but no rebound or guarding.  Laparotomy wound appears clean.  Multiple drains with purulent drainage MSK/EXT:  Moves extremities. No apparent deformity.  Trace BLE edema. SKIN: no apparent skin lesion or wound NEURO: Awake.  Oriented to self, place and situation.  No apparent focal neuro deficit. PSYCH: Appears anxious.   Procedures:  9/9-ex lap with drain placement by Dr. Dr. Sheliah Hatch 9/9-9/13-intubation and mechanical ventilation 9/26-drain placement by IR  Microbiology summarized: 9/8-COVID-19 and influenza PCR nonreactive. 9/9-MRSA PCR screen negative. 9/8 and 9/9-blood cultures NGTD 9/15-sputum culture with STENOTROPHOMONAS MALTOPHILIA sensitive to Levaquin and Bactrim 9/21-blood cultures NGTD. 9/26-abdominal drain culture with rare Enterococcus faecalis and few Clostridium clostridioforme  Assessment & Plan: Sepsis due to infected necrotizing pancreatitis with pneumoperitoneum and possible aspiration pneumonia.  Still with tachycardia, tachypnea  and leukocytosis -Manage individual problems as below.    Infected necrotizing pancreatitis with pneumoperitoneum, intra-abdominal and retroperitoneal fluid: -S/p ex-lap and drain placement by Dr. Drexel Iha on 9/9.  Fluid culture was not sent -CT a/p  on 9/17 with large amount of residual gas and fluid dissecting through the retroperitoneum -CT a/p over 9/24 with increased perihepatic fluid collection. -9/26- three additional drains by IR.  Fluid culture with E. faecalis and few Clostridium clostridioforme -9/8-Vanco/Zosyn-9/9-Zyvox/Zosyn-9/14 -meropenem-9/19-Levaquin/Flagyl-9/24 Zosyn- 9/27- Unasyn-9/30 -9/30 Zosyn>>> ID managing -10/1-repeat CT abdomen and pelvis ordered. -Surgical wound care and pain management per general surgery. -Continue postpyloric tube feed per general surgery. -IS/OOB/PT/OT -Transfer patient to 4 N. progressive   Acute respiratory failure with hypoxia: Previously intubated from 9/9-9/13.  Tachypneic to 30s.  Saturating in upper 80s to low 90s on 5 L now. Multifactorial including atelectasis and possible aspiration pneumonia.  It could also be from intra-abdominal process.  He appears fairly euvolemic on exam.  CXR with bibasilar opacities.  Formal read by radiology pending. -CXR with bilateral pleural effusion.  Discussed with rad. Difficult to tell the size from portable CXR -Added CT chest with contrast for better assessment -CT abdomen and pelvis as above.  -Aggressive pulmonary toileting, IS, OOB, PT/OT.  -Antibiotics as above.  Anasarca/edema-likely due to malnutrition and hypoalbuminemia.  BNP 160.  TTE on 9/15 basically normal.  Anasarca seems to have resolved with IV Lasix and IV albumin -Closely monitor fluid status, renal functions and electrolytes  Acute toxic-metabolic encephalopathy/delirium: This is multifactorial including sepsis, hepatic encephalopathy, ICU delirium or iatrogenic.  Ammonia elevated to 155.  -Start lactulose 20 g 3 times daily via  NG tube -Change Robaxin to as needed -Reorientation and delirium precautions.   -Continue IV Haldol as needed -Fall and aspiration precautions    Ileus: Seems to have resolved.  Diarrhea: Likely from TF.  He is also at risk for C. Difficile -Continue rectal tube   Acute hepatitis: Likely from TPN.  Resolved.   Alcohol abuse: Out of the window for withdrawal complications.   -Continue thiamine and folate   Acute Blood Loss Anemia, postsurgical: S/p 1 unit of pRBCs on 9/10 with stabilization in hemoglobin since.  Recent Labs    11/02/20 0925 11/04/20 0847 11/05/20 0345 11/06/20 0620 11/08/20 0557 11/09/20 0500 11/10/20 0430 11/11/20 0415 11/12/20 1142 11/13/20 0054  HGB 8.6* 7.8* 8.0* 8.5* 8.3* 8.1* 8.0* 8.3* 8.9* 8.6*  -Continue monitoring  Controlled DM-2 with hyperglycemia: A1c 6.8% on admission. Recent Labs  Lab 11/12/20 2018 11/13/20 0004 11/13/20 0329 11/13/20 0842 11/13/20 1240  GLUCAP 230* 225* 165* 221* 204*  -Continue SSI-high every 4 hours -Increase tube coverage from 6 to 8 units -Continue basal insulin at 20 units -Further adjustment as appropriate   Hypokalemia/hypomagnesemia/hypophosphatemia: Resolved.   Dysphagia: Cleared by SLP -Remains n.p.o. pending decision by general surgery  Debility/physical deconditioning -Continue PT/OT  Leukocytosis/bandemia: Likely due to infectious process. -Antibiotic as above -Continue monitoring  Severe protein calorie malnutrition in the setting of n.p.o. as evidenced by anasarca, hypoalbuminemia Body mass index is 32 kg/m. Nutrition Problem: Severe Malnutrition Etiology: acute illness (infected necrotic pancreatitis) Signs/Symptoms: moderate fat  depletion, moderate muscle depletion, energy intake < or equal to 50% for > or equal to 5 days, percent weight loss (8.4% weight loss in 1.5 months) Percent weight loss: 8.4 % (1.5 months) Interventions: TPN, Tube feeding, Refer to RD note for recommendations    DVT prophylaxis:  heparin injection 5,000 Units Start: 10/22/20 1400 Place and maintain sequential compression device Start: 10/19/2020 2202 SCDs Start: 11/01/2020 2145  Code Status: Full code Family Communication: Patient and RN.  Updated patient's wife over the phone. Level of care: Progressive Status is: Inpatient  Remains inpatient appropriate because:Ongoing active pain requiring inpatient pain management, Ongoing diagnostic testing needed not appropriate for outpatient work up, IV treatments appropriate due to intensity of illness or inability to take PO, and Inpatient level of care appropriate due to severity of illness  Dispo: The patient is from: Home              Anticipated d/c is to: [facility]              Patient currently is not medically stable to d/c.   Difficult to place patient No       Consultants:  General surgery Infectious disease Interventional radiology   Sch Meds:  Scheduled Meds:  chlorhexidine  15 mL Mouth Rinse BID   Chlorhexidine Gluconate Cloth  6 each Topical Q0600   feeding supplement (PROSource TF)  45 mL Per Tube TID   fentaNYL  1 patch Transdermal Q72H   fluticasone furoate-vilanterol  1 puff Inhalation Daily   And   umeclidinium bromide  1 puff Inhalation Daily   folic acid  1 mg Per Tube Daily   heparin  5,000 Units Subcutaneous Q8H   insulin aspart  0-20 Units Subcutaneous Q4H   insulin aspart  6 Units Subcutaneous Q4H   insulin glargine-yfgn  20 Units Subcutaneous Daily   lactulose  20 g Per Tube TID   mouth rinse  15 mL Mouth Rinse q12n4p   pantoprazole (PROTONIX) IV  40 mg Intravenous Q24H   sodium chloride flush  10-40 mL Intracatheter Q12H   sodium chloride flush  10-40 mL Intracatheter Q12H   sodium chloride flush  5 mL Intracatheter Q8H   sodium chloride flush  5 mL Intracatheter Q8H   sodium chloride flush  5 mL Intracatheter Q8H   thiamine  100 mg Per Tube Daily   Continuous Infusions:  sodium chloride Stopped (10/31/20  0131)   feeding supplement (VITAL 1.5 CAL) 1,000 mL (11/13/20 0735)   methocarbamol (ROBAXIN) IV     piperacillin-tazobactam (ZOSYN)  IV 3.375 g (11/13/20 1043)   PRN Meds:.sodium chloride, acetaminophen (TYLENOL) oral liquid 160 mg/5 mL, acetaminophen, albuterol, ALPRAZolam, haloperidol lactate, HYDROmorphone (DILAUDID) injection, methocarbamol (ROBAXIN) IV, ondansetron (ZOFRAN) IV, sodium chloride flush, sodium chloride flush  Antimicrobials: Anti-infectives (From admission, onward)    Start     Dose/Rate Route Frequency Ordered Stop   11/12/20 1900  piperacillin-tazobactam (ZOSYN) IVPB 3.375 g        3.375 g 12.5 mL/hr over 240 Minutes Intravenous Every 8 hours 11/12/20 1538     11/09/20 1730  Ampicillin-Sulbactam (UNASYN) 3 g in sodium chloride 0.9 % 100 mL IVPB  Status:  Discontinued        3 g 200 mL/hr over 30 Minutes Intravenous Every 6 hours 11/09/20 1617 11/12/20 1538   11/06/20 1545  piperacillin-tazobactam (ZOSYN) IVPB 3.375 g  Status:  Discontinued        3.375 g 12.5 mL/hr over 240 Minutes Intravenous Every  8 hours 11/06/20 1453 11/09/20 1616   11/01/20 0900  levofloxacin (LEVAQUIN) IVPB 750 mg  Status:  Discontinued        750 mg 100 mL/hr over 90 Minutes Intravenous Every 24 hours 11/01/20 0824 11/06/20 1408   11/01/20 0900  metroNIDAZOLE (FLAGYL) IVPB 500 mg  Status:  Discontinued        500 mg 100 mL/hr over 60 Minutes Intravenous Every 12 hours 11/01/20 0824 11/06/20 1408   10/29/20 2200  meropenem (MERREM) 1 g in sodium chloride 0.9 % 100 mL IVPB  Status:  Discontinued        1 g 200 mL/hr over 30 Minutes Intravenous Every 8 hours 10/29/20 1428 11/01/20 0824   10/28/20 1615  meropenem (MERREM) 2 g in sodium chloride 0.9 % 100 mL IVPB  Status:  Discontinued        2 g 200 mL/hr over 30 Minutes Intravenous Every 8 hours 10/28/20 1515 10/29/20 1428   10/28/20 1445  metroNIDAZOLE (FLAGYL) IVPB 500 mg  Status:  Discontinued        500 mg 100 mL/hr over 60 Minutes  Intravenous Every 12 hours 10/28/20 1348 10/28/20 1505   10/25/20 2300  fluconazole (DIFLUCAN) IVPB 400 mg        400 mg 100 mL/hr over 120 Minutes Intravenous Every 24 hours 10/25/20 0742 10/29/20 0718   10/24/20 2300  fluconazole (DIFLUCAN) IVPB 200 mg  Status:  Discontinued        200 mg 100 mL/hr over 60 Minutes Intravenous Every 24 hours 10/24/20 0943 10/25/20 0742   10/22/20 1800  vancomycin (VANCOREADY) IVPB 1500 mg/300 mL  Status:  Discontinued        1,500 mg 150 mL/hr over 120 Minutes Intravenous Every 24 hours 10/23/2020 2225 10/22/20 0759   10/22/20 0900  linezolid (ZYVOX) IVPB 600 mg  Status:  Discontinued        600 mg 300 mL/hr over 60 Minutes Intravenous Every 12 hours 10/22/20 0800 10/27/20 0852   10/22/20 0300  piperacillin-tazobactam (ZOSYN) IVPB 3.375 g  Status:  Discontinued        3.375 g 12.5 mL/hr over 240 Minutes Intravenous Every 8 hours 11/12/2020 2225 10/28/20 1514   10/26/2020 2335  metroNIDAZOLE (FLAGYL) IVPB 500 mg  Status:  Discontinued        500 mg 100 mL/hr over 60 Minutes Intravenous Every 12 hours 10/19/2020 2142 10/22/20 0747   10/14/2020 2248  fluconazole (DIFLUCAN) IVPB 400 mg  Status:  Discontinued        400 mg 100 mL/hr over 120 Minutes Intravenous Every 24 hours 11/03/2020 2142 10/24/20 0943   10/27/2020 1748  vancomycin (VANCOREADY) IVPB 2000 mg/400 mL        2,000 mg 200 mL/hr over 120 Minutes Intravenous  Once 11/09/2020 1706 10/18/2020 2113   10/24/2020 1715  piperacillin-tazobactam (ZOSYN) IVPB 3.375 g        3.375 g 100 mL/hr over 30 Minutes Intravenous  Once 11/03/2020 1706 10/29/2020 1903        I have personally reviewed the following labs and images: CBC: Recent Labs  Lab 11/09/20 0500 11/10/20 0430 11/11/20 0415 11/12/20 1142 11/13/20 0054  WBC 13.4* 15.3* 18.2* 17.3* 14.9*  NEUTROABS  --   --   --   --  12.4*  HGB 8.1* 8.0* 8.3* 8.9* 8.6*  HCT 25.8* 26.0* 26.5* 28.8* 28.3*  MCV 102.4* 102.8* 101.1* 103.2* 102.9*  PLT 350 366 368 389 382    BMP &GFR Recent  Labs  Lab 11/08/20 0557 11/09/20 0450 11/09/20 0500 11/10/20 0430 11/11/20 0415 11/13/20 0054  NA 135 133*  --  130* 134* 137  K 4.0 3.5  --  3.6 3.8 3.5  CL 97* 96*  --  96* 98 102  CO2 30 30  --  30 29 28   GLUCOSE 172* 180*  --  147* 214* 224*  BUN 16 13  --  9 6 10   CREATININE 0.70 0.61  --  0.52* 0.53* 0.57*  CALCIUM 7.9* 7.9*  --  7.8* 7.9* 7.9*  MG 2.0  --  1.9 2.0 1.9 2.0  PHOS 4.0 3.2  --  2.7 2.5 2.1*   Estimated Creatinine Clearance: 124.8 mL/min (A) (by C-G formula based on SCr of 0.57 mg/dL (L)). Liver & Pancreas: Recent Labs  Lab 11/07/20 0440 11/08/20 0557 11/09/20 0450 11/10/20 0430 11/11/20 0415 11/13/20 0054  AST 24 20  --   --  26 22  ALT 15 14  --   --  14 15  ALKPHOS 82 76  --   --  91 90  BILITOT 0.7 0.8  --   --  0.8 0.7  PROT 5.9* 6.3*  --   --  5.6* 5.8*  ALBUMIN 1.9* 1.7* 1.6* 1.5* 1.6* 1.6*   Recent Labs  Lab 11/13/20 0054  LIPASE 29   Recent Labs  Lab 11/07/20 0440 11/13/20 0054  AMMONIA 38* 155*   Diabetic: No results for input(s): HGBA1C in the last 72 hours. Recent Labs  Lab 11/12/20 2018 11/13/20 0004 11/13/20 0329 11/13/20 0842 11/13/20 1240  GLUCAP 230* 225* 165* 221* 204*   Cardiac Enzymes: Recent Labs  Lab 11/07/20 0440  CKTOTAL 10*   No results for input(s): PROBNP in the last 8760 hours. Coagulation Profile: No results for input(s): INR, PROTIME in the last 168 hours. Thyroid Function Tests: No results for input(s): TSH, T4TOTAL, FREET4, T3FREE, THYROIDAB in the last 72 hours.  Lipid Profile: No results for input(s): CHOL, HDL, LDLCALC, TRIG, CHOLHDL, LDLDIRECT in the last 72 hours.  Anemia Panel: No results for input(s): VITAMINB12, FOLATE, FERRITIN, TIBC, IRON, RETICCTPCT in the last 72 hours. Urine analysis:    Component Value Date/Time   COLORURINE AMBER (A) 10/29/20 1549   APPEARANCEUR CLEAR 10-29-2020 1549   LABSPEC 1.015 10-29-2020 1549   PHURINE 6.0 10-29-2020 1549    GLUCOSEU NEGATIVE 10/29/20 1549   HGBUR TRACE (A) October 29, 2020 1549   BILIRUBINUR SMALL (A) 10-29-20 1549   KETONESUR 15 (A) 2020-10-29 1549   PROTEINUR NEGATIVE 2020-10-29 1549   NITRITE NEGATIVE October 29, 2020 1549   LEUKOCYTESUR NEGATIVE 29-Oct-2020 1549   Sepsis Labs: Invalid input(s): PROCALCITONIN, LACTICIDVEN  Microbiology: Recent Results (from the past 240 hour(s))  Culture, blood (routine x 2)     Status: None   Collection Time: 11/07/20 11:45 AM   Specimen: BLOOD LEFT ARM  Result Value Ref Range Status   Specimen Description BLOOD LEFT ARM  Final   Special Requests   Final    BOTTLES DRAWN AEROBIC AND ANAEROBIC Blood Culture adequate volume   Culture   Final    NO GROWTH 5 DAYS Performed at [facility], [address] [place], [place] [address]    Report Status 11/12/2020 FINAL  Final  Culture, blood (routine x 2)     Status: None   Collection Time: 11/07/20 11:51 AM   Specimen: BLOOD LEFT HAND  Result Value Ref Range Status   Specimen Description BLOOD LEFT HAND  Final  Special Requests   Final    BOTTLES DRAWN AEROBIC ONLY Blood Culture adequate volume   Culture   Final    NO GROWTH 5 DAYS Performed at [facility], [address] [place], [place] [address]    Report Status 11/12/2020 FINAL  Final  Aerobic/Anaerobic Culture w Gram Stain (surgical/deep wound)     Status: None (Preliminary result)   Collection Time: 11/08/20  1:47 PM   Specimen: Wound  Result Value Ref Range Status   Specimen Description WOUND LEFT ABDOMEN  Final   Special Requests CT DRAIN  Final   Gram Stain   Final    NO SQUAMOUS EPITHELIAL CELLS PRESENT MODERATE WBC PRESENT,BOTH PMN AND MONONUCLEAR ABUNDANT GRAM POSITIVE COCCI IN CLUSTERS RARE GRAM NEGATIVE RODS Performed at [facility], [address] [place], [place] [address]    Culture   Final    RARE ENTEROCOCCUS FAECALIS Sent to [facility] for further susceptibility testing. FEW CLOSTRIDIUM CLOSTRIDIOFORME     Report Status PENDING  Incomplete    Radiology Studies: No results found.   Courtne Lighty T. Garland Smouse [facility]  If 7PM-7AM, please contact night-coverage [email or web address] 11/13/2020, 2:54 PM

## 2020-11-13 NOTE — Progress Notes (Addendum)
    11/13/20 1415  Assess: MEWS Score  Temp 99 F (37.2 C) (RN notified of vitals)  BP (!) 142/90  Pulse Rate (!) 115  Resp (!) 32  SpO2 (!) 86 % (Pt breathing with mouth open- will not take deep breaths)  O2 Device Nasal Cannula  O2 Flow Rate (L/min) 4 L/min  Assess: MEWS Score  MEWS Temp 0  MEWS Systolic 0  MEWS Pulse 2  MEWS RR 2  MEWS LOC 0  MEWS Score 4  MEWS Score Color Red  Assess: if the MEWS score is Yellow or Red  Were vital signs taken at a resting state? Yes  Focused Assessment No change from prior assessment  Early Detection of Sepsis Score *See Row Information* Medium  MEWS guidelines implemented *See Row Information* Yes  Take Vital Signs  Increase Vital Sign Frequency  Red: Q 1hr X 4 then Q 4hr X 4, if remains red, continue Q 4hrs  Escalate  MEWS: Escalate Red: discuss with charge nurse/RN and provider, consider discussing with RRT     11/13/20 1420  Notify: Charge Nurse/RN  Name of Charge Nurse/RN Notified Kirtland Bouchard, RN  Date Charge Nurse/RN Notified 11/13/20  Time Charge Nurse/RN Notified 1420  Notify: Provider  Provider Name/Title Dr. Alanda Slim  Date Provider Notified 11/13/20  Time Provider Notified 1421  Notification Type Page  Notification Reason Change in status  Provider response See new orders  Date of Provider Response 11/13/20  Time of Provider Response 1426  Notify: Rapid Response  Name of Rapid Response RN Notified Luisa Hart, RN  Date Rapid Response Notified 11/13/20  Time Rapid Response Notified 1420

## 2020-11-13 NOTE — Progress Notes (Addendum)
PICC placed in RUE.  At beginning of procedure, pt relatively calm, answering questions, disoriented but some conversation.  During PICC placement, pt became progressively more agitated.  Once insertion complete and sterile drape removed, pt immediately reached for the PICC line to remove it, unsuccessful due to mittens and VAS Team efforts to distract and contain.  Pt began swinging at this nurse and attempting out of bed.  SR up x4, Jacquelyn RN notified and in room.  Recommended restraints to prevent pulling out lines due to fast movements and difficulty consoling and calming with conversation, argumentative.  Hx of pulling out previous PICC line.

## 2020-11-13 NOTE — Progress Notes (Signed)
Progress Note  23 Days Post-Op  Subjective: Complains of feeling very anxious today.  Denies abd pain  Objective: Vital signs in last 24 hours: Temp:  [98.1 F (36.7 C)-100 F (37.8 C)] 98.1 F (36.7 C) (10/01 0850) Pulse Rate:  [106-110] 110 (10/01 0455) Resp:  [18-20] 20 (10/01 0850) BP: (134-148)/(83-87) 138/86 (10/01 0850) SpO2:  [89 %-91 %] 91 % (10/01 0850) Last BM Date: 11/12/20  Intake/Output from previous day: 09/30 0701 - 10/01 0700 In: 1728.8 [NG/GT:1504.6; IV Piggyback:224.2] Out: 165 [Drains:165] Intake/Output this shift: No intake/output data recorded.  PE: Gen:  Awake and alert, lying in bed Heart: RRR Pulm: normal effort on nasal cannula Abd: Distended but soft, diffusely TTP but minimal  RUQ surgical JP drains x2 with tan-grey thick drainage RUQ IR drain SS, slightly cloudy RLQ IR drain purulent LLQ IR serous-cloudy Midline wound dressing c/d/i Laparoscopic sites c/d/i   Lab Results:  Recent Labs    11/12/20 1142 11/13/20 0054  WBC 17.3* 14.9*  HGB 8.9* 8.6*  HCT 28.8* 28.3*  PLT 389 382    BMET Recent Labs    11/11/20 0415 11/13/20 0054  NA 134* 137  K 3.8 3.5  CL 98 102  CO2 29 28  GLUCOSE 214* 224*  BUN 6 10  CREATININE 0.53* 0.57*  CALCIUM 7.9* 7.9*    PT/INR No results for input(s): LABPROT, INR in the last 72 hours. CMP     Component Value Date/Time   NA 137 11/13/2020 0054   K 3.5 11/13/2020 0054   CL 102 11/13/2020 0054   CO2 28 11/13/2020 0054   GLUCOSE 224 (H) 11/13/2020 0054   BUN 10 11/13/2020 0054   CREATININE 0.57 (L) 11/13/2020 0054   CALCIUM 7.9 (L) 11/13/2020 0054   PROT 5.8 (L) 11/13/2020 0054   ALBUMIN 1.6 (L) 11/13/2020 0054   AST 22 11/13/2020 0054   ALT 15 11/13/2020 0054   ALKPHOS 90 11/13/2020 0054   BILITOT 0.7 11/13/2020 0054   GFRNONAA >60 11/13/2020 0054   Lipase     Component Value Date/Time   LIPASE 29 11/13/2020 0054       Studies/Results: No results  found.  Anti-infectives: Anti-infectives (From admission, onward)    Start     Dose/Rate Route Frequency Ordered Stop   11/12/20 1900  piperacillin-tazobactam (ZOSYN) IVPB 3.375 g        3.375 g 12.5 mL/hr over 240 Minutes Intravenous Every 8 hours 11/12/20 1538     11/09/20 1730  Ampicillin-Sulbactam (UNASYN) 3 g in sodium chloride 0.9 % 100 mL IVPB  Status:  Discontinued        3 g 200 mL/hr over 30 Minutes Intravenous Every 6 hours 11/09/20 1617 11/12/20 1538   11/06/20 1545  piperacillin-tazobactam (ZOSYN) IVPB 3.375 g  Status:  Discontinued        3.375 g 12.5 mL/hr over 240 Minutes Intravenous Every 8 hours 11/06/20 1453 11/09/20 1616   11/01/20 0900  levofloxacin (LEVAQUIN) IVPB 750 mg  Status:  Discontinued        750 mg 100 mL/hr over 90 Minutes Intravenous Every 24 hours 11/01/20 0824 11/06/20 1408   11/01/20 0900  metroNIDAZOLE (FLAGYL) IVPB 500 mg  Status:  Discontinued        500 mg 100 mL/hr over 60 Minutes Intravenous Every 12 hours 11/01/20 0824 11/06/20 1408   10/29/20 2200  meropenem (MERREM) 1 g in sodium chloride 0.9 % 100 mL IVPB  Status:  Discontinued  1 g 200 mL/hr over 30 Minutes Intravenous Every 8 hours 10/29/20 1428 11/01/20 0824   10/28/20 1615  meropenem (MERREM) 2 g in sodium chloride 0.9 % 100 mL IVPB  Status:  Discontinued        2 g 200 mL/hr over 30 Minutes Intravenous Every 8 hours 10/28/20 1515 10/29/20 1428   10/28/20 1445  metroNIDAZOLE (FLAGYL) IVPB 500 mg  Status:  Discontinued        500 mg 100 mL/hr over 60 Minutes Intravenous Every 12 hours 10/28/20 1348 10/28/20 1505   10/25/20 2300  fluconazole (DIFLUCAN) IVPB 400 mg        400 mg 100 mL/hr over 120 Minutes Intravenous Every 24 hours 10/25/20 0742 10/29/20 0718   10/24/20 2300  fluconazole (DIFLUCAN) IVPB 200 mg  Status:  Discontinued        200 mg 100 mL/hr over 60 Minutes Intravenous Every 24 hours 10/24/20 0943 10/25/20 0742   10/22/20 1800  vancomycin (VANCOREADY) IVPB 1500  mg/300 mL  Status:  Discontinued        1,500 mg 150 mL/hr over 120 Minutes Intravenous Every 24 hours 10/20/2020 2225 10/22/20 0759   10/22/20 0900  linezolid (ZYVOX) IVPB 600 mg  Status:  Discontinued        600 mg 300 mL/hr over 60 Minutes Intravenous Every 12 hours 10/22/20 0800 10/27/20 0852   10/22/20 0300  piperacillin-tazobactam (ZOSYN) IVPB 3.375 g  Status:  Discontinued        3.375 g 12.5 mL/hr over 240 Minutes Intravenous Every 8 hours 11/11/2020 2225 10/28/20 1514   11/02/2020 2335  metroNIDAZOLE (FLAGYL) IVPB 500 mg  Status:  Discontinued        500 mg 100 mL/hr over 60 Minutes Intravenous Every 12 hours 11/08/2020 2142 10/22/20 0747   11/06/2020 2248  fluconazole (DIFLUCAN) IVPB 400 mg  Status:  Discontinued        400 mg 100 mL/hr over 120 Minutes Intravenous Every 24 hours 10/26/2020 2142 10/24/20 0943   11/06/2020 1748  vancomycin (VANCOREADY) IVPB 2000 mg/400 mL        2,000 mg 200 mL/hr over 120 Minutes Intravenous  Once 11/09/2020 1706 10/24/2020 2113   10/23/2020 1715  piperacillin-tazobactam (ZOSYN) IVPB 3.375 g        3.375 g 100 mL/hr over 30 Minutes Intravenous  Once 11/05/2020 1706 10/28/2020 1903        Assessment/Plan POD 22 s/p ex lap with pancreatic debridement for infected necrotic pancreatitis by Dr. Sheliah Hatch on 9/9 - Superior drain under the mesocolon along with the head of the pancreas - Inferior drain under the mesocolon along the left upper abdomen and what is likely a position inferior to the pancreas - CT 9/24 w/ gas and fluid collections that are grossly stable in the retroperitoneum and are drained by JP tubes. Cont drains - CT 9/24 also with mildly increased amount of fluid is noted around the liver with an increased amount of air seen in the non dependent portion concerning for persistent bowel leak. - pt is having bowel function and IR drains placed 9/26 do not appear feculent. Low clinical suspicion for bowel leak - continue post-pyloric tube feeds - s/p IR  drains 9/26. Cx with enterococcus faecalis, sensitivities pending  - BID WTD - Cont therapies, rec CIR - may need to consider LTACH with needs for continued post-pyloric TF - Pulm toilet - Appreciate TRH's and ID's assistance w/ his care -WBC down today   FEN: NPO, TF  at goal rate ID: currently on unasyn VTE: SCDs, heparin subq. LE [place] neg for DVT's Foley - out, ext cath, voiding    - Per primary -  VDRF - now extubated PNA w/ + resp cx's - cx's w/ stenotrophomonas maltophilia - finished course of Levaquin  B/l pleural effusions Abl anemia  ETOH use  AKI - resolved T2DM  LOS: 23 days    Paul Panda, MD [facility] Surgery 11/13/2020, 9:06 AM Please see Amion for pager number during day hours 7:00am-4:30pm

## 2020-11-13 DEATH — deceased

## 2020-11-14 ENCOUNTER — Inpatient Hospital Stay (HOSPITAL_COMMUNITY): Payer: BC Managed Care – PPO

## 2020-11-14 DIAGNOSIS — K8591 Acute pancreatitis with uninfected necrosis, unspecified: Secondary | ICD-10-CM | POA: Diagnosis not present

## 2020-11-14 DIAGNOSIS — A419 Sepsis, unspecified organism: Secondary | ICD-10-CM

## 2020-11-14 DIAGNOSIS — J9601 Acute respiratory failure with hypoxia: Secondary | ICD-10-CM | POA: Diagnosis not present

## 2020-11-14 DIAGNOSIS — I469 Cardiac arrest, cause unspecified: Secondary | ICD-10-CM | POA: Diagnosis not present

## 2020-11-14 LAB — POCT I-STAT 7, (LYTES, BLD GAS, ICA,H+H)
Acid-Base Excess: 6 mmol/L — ABNORMAL HIGH (ref 0.0–2.0)
Acid-Base Excess: 5 mmol/L — ABNORMAL HIGH (ref 0.0–2.0)
Bicarbonate: 33 mmol/L — ABNORMAL HIGH (ref 20.0–28.0)
Bicarbonate: 32.3 mmol/L — ABNORMAL HIGH (ref 20.0–28.0)
Calcium, Ion: 1.22 mmol/L (ref 1.15–1.40)
Calcium, Ion: 1.22 mmol/L (ref 1.15–1.40)
HCT: 28 % — ABNORMAL LOW (ref 39.0–52.0)
HCT: 30 % — ABNORMAL LOW (ref 39.0–52.0)
Hemoglobin: 9.5 g/dL — ABNORMAL LOW (ref 13.0–17.0)
Hemoglobin: 10.2 g/dL — ABNORMAL LOW (ref 13.0–17.0)
O2 Saturation: 85 %
O2 Saturation: 83 %
Patient temperature: 99.1
Patient temperature: 99.1
Potassium: 3.5 mmol/L (ref 3.5–5.1)
Potassium: 4 mmol/L (ref 3.5–5.1)
Sodium: 144 mmol/L (ref 135–145)
Sodium: 143 mmol/L (ref 135–145)
TCO2: 35 mmol/L — ABNORMAL HIGH (ref 22–32)
TCO2: 34 mmol/L — ABNORMAL HIGH (ref 22–32)
pCO2 arterial: 64.5 mmHg — ABNORMAL HIGH (ref 32.0–48.0)
pCO2 arterial: 65.4 mmHg (ref 32.0–48.0)
pH, Arterial: 7.318 — ABNORMAL LOW (ref 7.350–7.450)
pH, Arterial: 7.304 — ABNORMAL LOW (ref 7.350–7.450)
pO2, Arterial: 57 mmHg — ABNORMAL LOW (ref 83.0–108.0)
pO2, Arterial: 55 mmHg — ABNORMAL LOW (ref 83.0–108.0)

## 2020-11-14 LAB — CBC
HCT: 29.4 % — ABNORMAL LOW (ref 39.0–52.0)
Hemoglobin: 8.7 g/dL — ABNORMAL LOW (ref 13.0–17.0)
MCH: 31.5 pg (ref 26.0–34.0)
MCHC: 29.6 g/dL — ABNORMAL LOW (ref 30.0–36.0)
MCV: 106.5 fL — ABNORMAL HIGH (ref 80.0–100.0)
Platelets: 402 10*3/uL — ABNORMAL HIGH (ref 150–400)
RBC: 2.76 MIL/uL — ABNORMAL LOW (ref 4.22–5.81)
RDW: 19.2 % — ABNORMAL HIGH (ref 11.5–15.5)
WBC: 22.6 10*3/uL — ABNORMAL HIGH (ref 4.0–10.5)
nRBC: 0.4 % — ABNORMAL HIGH (ref 0.0–0.2)

## 2020-11-14 LAB — COMPREHENSIVE METABOLIC PANEL
ALT: 14 U/L (ref 0–44)
AST: 23 U/L (ref 15–41)
Albumin: 1.7 g/dL — ABNORMAL LOW (ref 3.5–5.0)
Alkaline Phosphatase: 86 U/L (ref 38–126)
Anion gap: 11 (ref 5–15)
BUN: 11 mg/dL (ref 6–20)
CO2: 23 mmol/L (ref 22–32)
Calcium: 8.2 mg/dL — ABNORMAL LOW (ref 8.9–10.3)
Chloride: 105 mmol/L (ref 98–111)
Creatinine, Ser: 0.53 mg/dL — ABNORMAL LOW (ref 0.61–1.24)
GFR, Estimated: >60 mL/min (ref >60)
Glucose, Bld: 148 mg/dL — ABNORMAL HIGH (ref 70–99)
Potassium: 4.2 mmol/L (ref 3.5–5.1)
Sodium: 139 mmol/L (ref 135–145)
Total Bilirubin: 0.7 mg/dL (ref 0.3–1.2)
Total Protein: 6.1 g/dL — ABNORMAL LOW (ref 6.5–8.1)

## 2020-11-14 LAB — RENAL FUNCTION PANEL
Albumin: 1.6 g/dL — ABNORMAL LOW (ref 3.5–5.0)
Anion gap: 12 (ref 5–15)
BUN: 12 mg/dL (ref 6–20)
CO2: 22 mmol/L (ref 22–32)
Calcium: 8.4 mg/dL — ABNORMAL LOW (ref 8.9–10.3)
Chloride: 107 mmol/L (ref 98–111)
Creatinine, Ser: 0.43 mg/dL — ABNORMAL LOW (ref 0.61–1.24)
GFR, Estimated: >60 mL/min (ref >60)
Glucose, Bld: 154 mg/dL — ABNORMAL HIGH (ref 70–99)
Phosphorus: 4.3 mg/dL (ref 2.5–4.6)
Potassium: 5.2 mmol/L — ABNORMAL HIGH (ref 3.5–5.1)
Sodium: 141 mmol/L (ref 135–145)

## 2020-11-14 LAB — GLUCOSE, CAPILLARY
Glucose-Capillary: 108 mg/dL — ABNORMAL HIGH (ref 70–99)
Glucose-Capillary: 126 mg/dL — ABNORMAL HIGH (ref 70–99)
Glucose-Capillary: 126 mg/dL — ABNORMAL HIGH (ref 70–99)
Glucose-Capillary: 134 mg/dL — ABNORMAL HIGH (ref 70–99)
Glucose-Capillary: 149 mg/dL — ABNORMAL HIGH (ref 70–99)
Glucose-Capillary: 191 mg/dL — ABNORMAL HIGH (ref 70–99)

## 2020-11-14 LAB — AMMONIA: Ammonia: 73 umol/L — ABNORMAL HIGH (ref 9–35)

## 2020-11-14 LAB — LACTIC ACID, PLASMA
Lactic Acid, Venous: 1.9 mmol/L (ref 0.5–1.9)
Lactic Acid, Venous: 1.8 mmol/L (ref 0.5–1.9)

## 2020-11-14 LAB — MAGNESIUM: Magnesium: 2 mg/dL (ref 1.7–2.4)

## 2020-11-14 MED ORDER — FENTANYL CITRATE PF 50 MCG/ML IJ SOSY
PREFILLED_SYRINGE | INTRAMUSCULAR | Status: AC
  Filled 2020-11-14: qty 2

## 2020-11-14 MED ORDER — DEXMEDETOMIDINE HCL IN NACL 400 MCG/100ML IV SOLN
0.0000 ug/kg/h | INTRAVENOUS | Status: AC
  Administered 2020-11-14: 1.1 ug/kg/h via INTRAVENOUS
  Administered 2020-11-14: 1.2 ug/kg/h via INTRAVENOUS
  Administered 2020-11-14: 0.7 ug/kg/h via INTRAVENOUS
  Administered 2020-11-14: 0.4 ug/kg/h via INTRAVENOUS
  Administered 2020-11-14: 1.2 ug/kg/h via INTRAVENOUS
  Administered 2020-11-15: 0.9 ug/kg/h via INTRAVENOUS
  Administered 2020-11-15 (×3): 1.2 ug/kg/h via INTRAVENOUS
  Administered 2020-11-15: 1 ug/kg/h via INTRAVENOUS
  Administered 2020-11-15 – 2020-11-16 (×7): 1.2 ug/kg/h via INTRAVENOUS
  Administered 2020-11-16: 0.9 ug/kg/h via INTRAVENOUS
  Administered 2020-11-17: 1.2 ug/kg/h via INTRAVENOUS
  Filled 2020-11-14: qty 100
  Filled 2020-11-14: qty 200
  Filled 2020-11-14 (×12): qty 100
  Filled 2020-11-14: qty 200
  Filled 2020-11-14 (×3): qty 100

## 2020-11-14 MED ORDER — ROCURONIUM BROMIDE 10 MG/ML (PF) SYRINGE: 100.0000 mg | PREFILLED_SYRINGE | Freq: Once | INTRAVENOUS | Status: AC

## 2020-11-14 MED ORDER — FENTANYL CITRATE PF 50 MCG/ML IJ SOSY
50.0000 ug | PREFILLED_SYRINGE | INTRAMUSCULAR | Status: DC | PRN
  Filled 2020-11-14: qty 2

## 2020-11-14 MED ORDER — LACTATED RINGERS IV SOLN: INTRAVENOUS | Status: DC

## 2020-11-14 MED ORDER — FENTANYL CITRATE (PF) 100 MCG/2ML IJ SOLN
50.0000 ug | Freq: Once | INTRAMUSCULAR | Status: AC
  Administered 2020-11-14: 50 ug via INTRAVENOUS

## 2020-11-14 MED ORDER — DOCUSATE SODIUM 50 MG/5ML PO LIQD
100.0000 mg | Freq: Two times a day (BID) | ORAL | Status: DC
  Administered 2020-11-15: 100 mg
  Filled 2020-11-14: qty 10

## 2020-11-14 MED ORDER — PHENYLEPHRINE 40 MCG/ML (10ML) SYRINGE FOR IV PUSH (FOR BLOOD PRESSURE SUPPORT)
80.0000 ug | PREFILLED_SYRINGE | Freq: Once | INTRAVENOUS | Status: AC
  Administered 2020-11-14: 80 ug via INTRAVENOUS

## 2020-11-14 MED ORDER — ROCURONIUM BROMIDE 10 MG/ML (PF) SYRINGE
PREFILLED_SYRINGE | INTRAVENOUS | Status: AC
  Administered 2020-11-14: 100 mg via INTRAVENOUS
  Filled 2020-11-14: qty 10

## 2020-11-14 MED ORDER — SUCCINYLCHOLINE CHLORIDE 200 MG/10ML IV SOSY
PREFILLED_SYRINGE | INTRAVENOUS | Status: AC
  Filled 2020-11-14: qty 10

## 2020-11-14 MED ORDER — MIDAZOLAM HCL 2 MG/2ML IJ SOLN
INTRAMUSCULAR | Status: AC
  Administered 2020-11-14: 2 mg via INTRAVENOUS
  Filled 2020-11-14: qty 2

## 2020-11-14 MED ORDER — ETOMIDATE 2 MG/ML IV SOLN: 20.0000 mg | Freq: Once | INTRAVENOUS | Status: AC

## 2020-11-14 MED ORDER — POLYETHYLENE GLYCOL 3350 17 G PO PACK
17.0000 g | PACK | Freq: Every day | ORAL | Status: DC
  Administered 2020-11-15 – 2020-11-16 (×2): 17 g
  Filled 2020-11-14 (×2): qty 1

## 2020-11-14 MED ORDER — FENTANYL CITRATE (PF) 100 MCG/2ML IJ SOLN
50.0000 ug | INTRAMUSCULAR | Status: DC | PRN
  Administered 2020-11-14 (×2): 100 ug via INTRAVENOUS
  Administered 2020-11-15: 200 ug via INTRAVENOUS
  Administered 2020-11-15: 100 ug via INTRAVENOUS
  Administered 2020-11-15: 200 ug via INTRAVENOUS
  Administered 2020-11-15 (×2): 100 ug via INTRAVENOUS
  Administered 2020-11-15: 200 ug via INTRAVENOUS
  Administered 2020-11-15 (×2): 100 ug via INTRAVENOUS
  Administered 2020-11-15: 200 ug via INTRAVENOUS
  Administered 2020-11-15: 100 ug via INTRAVENOUS
  Administered 2020-11-15 – 2020-11-16 (×3): 200 ug via INTRAVENOUS
  Administered 2020-11-16 (×2): 100 ug via INTRAVENOUS
  Administered 2020-11-16 (×3): 200 ug via INTRAVENOUS
  Administered 2020-11-16 – 2020-11-18 (×14): 100 ug via INTRAVENOUS
  Administered 2020-11-18: 150 ug via INTRAVENOUS
  Administered 2020-11-18 – 2020-11-21 (×11): 100 ug via INTRAVENOUS
  Filled 2020-11-14 (×2): qty 2
  Filled 2020-11-14: qty 4
  Filled 2020-11-14 (×2): qty 2
  Filled 2020-11-14: qty 4
  Filled 2020-11-14 (×2): qty 2
  Filled 2020-11-14: qty 4
  Filled 2020-11-14 (×9): qty 2
  Filled 2020-11-14: qty 4
  Filled 2020-11-14: qty 2
  Filled 2020-11-14: qty 4
  Filled 2020-11-14: qty 2
  Filled 2020-11-14 (×2): qty 4
  Filled 2020-11-14 (×6): qty 2
  Filled 2020-11-14: qty 4
  Filled 2020-11-14: qty 2
  Filled 2020-11-14 (×2): qty 4
  Filled 2020-11-14 (×11): qty 2
  Filled 2020-11-14: qty 4
  Filled 2020-11-14 (×2): qty 2
  Filled 2020-11-14: qty 4

## 2020-11-14 MED ORDER — NOREPINEPHRINE 4 MG/250ML-% IV SOLN
INTRAVENOUS | Status: AC
  Administered 2020-11-14: 20 ug/min via INTRAVENOUS
  Filled 2020-11-14: qty 250

## 2020-11-14 MED ORDER — VANCOMYCIN HCL 2000 MG/400ML IV SOLN
2000.0000 mg | Freq: Once | INTRAVENOUS | Status: AC
  Administered 2020-11-14: 2000 mg via INTRAVENOUS
  Filled 2020-11-14: qty 400

## 2020-11-14 MED ORDER — MIDAZOLAM HCL 2 MG/2ML IJ SOLN: 2.0000 mg | Freq: Once | INTRAMUSCULAR | Status: AC

## 2020-11-14 MED ORDER — FENTANYL CITRATE PF 50 MCG/ML IJ SOSY: 50.0000 ug | PREFILLED_SYRINGE | INTRAMUSCULAR | Status: DC | PRN

## 2020-11-14 MED ORDER — ALBUMIN HUMAN 5 % IV SOLN
25.0000 g | Freq: Once | INTRAVENOUS | Status: AC
  Administered 2020-11-14: 25 g via INTRAVENOUS
  Filled 2020-11-14: qty 500

## 2020-11-14 MED ORDER — FUROSEMIDE 10 MG/ML IJ SOLN
40.0000 mg | Freq: Once | INTRAMUSCULAR | Status: AC
  Administered 2020-11-14: 40 mg via INTRAVENOUS
  Filled 2020-11-14: qty 4

## 2020-11-14 MED ORDER — ETOMIDATE 2 MG/ML IV SOLN
INTRAVENOUS | Status: AC
  Administered 2020-11-14: 20 mg via INTRAVENOUS
  Filled 2020-11-14: qty 20

## 2020-11-14 MED ORDER — NOREPINEPHRINE 4 MG/250ML-% IV SOLN
0.0000 ug/min | INTRAVENOUS | Status: DC
  Administered 2020-11-14: 2 ug/min via INTRAVENOUS
  Filled 2020-11-14: qty 250

## 2020-11-14 MED ORDER — VANCOMYCIN HCL 750 MG/150ML IV SOLN
750.0000 mg | Freq: Three times a day (TID) | INTRAVENOUS | Status: DC
  Administered 2020-11-14 – 2020-11-15 (×3): 750 mg via INTRAVENOUS
  Filled 2020-11-14 (×4): qty 150

## 2020-11-14 NOTE — Progress Notes (Signed)
Pharmacy Antibiotic Note  Paul Chambers is a 56 y.o. male admitted on 2020/11/20 with pneumonia.  Pharmacy has been consulted for Vancomycin dosing. Pt also on Zosyn for enterococcus and clostridium intra-abd abscesses (being followed by ID) and this is Day #25 of total abx.  Plan: Vancomycin 2000 mg IV now then 750mg  Q 8 hrs. Goal AUC 400-550. Expected AUC: 486 SCr used: 0.8, Vd coeff 0.5 Will f/u renal function, micro data, and pt's clinical condition Vanc levels prn   Height: 5\' 9"  (175.3 cm) Weight: 98.3 kg (216 lb 11.4 oz) IBW/kg (Calculated) : 70.7  Temp (24hrs), Avg:99.3 F (37.4 C), Min:98.1 F (36.7 C), Max:100.1 F (37.8 C)  Recent Labs  Lab 11/08/20 0557 11/09/20 0450 11/09/20 0500 11/10/20 0430 11/11/20 0415 11/12/20 1142 11/13/20 0054  WBC 13.0*  --  13.4* 15.3* 18.2* 17.3* 14.9*  CREATININE 0.70 0.61  --  0.52* 0.53*  --  0.57*    Estimated Creatinine Clearance: 124.8 mL/min (A) (by C-G formula based on SCr of 0.57 mg/dL (L)).    No Known Allergies  Antimicrobials this admission: Unasyn 9/27>>9/30 Vanc 9/8 >> 9/9; restart 10/2 >> Zosyn 9/8 >>9/15; 9/24>>9/27, 9/30> Flagyl 9/8 >> 9/9, restarted 9/19 >>9/24 Fluc 9/8 >> 9/16 Linezolid 9/9>>9/14 Merrem 9/15 >> 9/19 LVQ 9/19 >>9/24    Microbiology results: 9/9 blood x 2: negative 9/9 MRSA PCR: negative 9/8 COVID and flu: negative 9/15 Sputum: Stenotrophomonas (pan sensitive) 9/21 Blood x 2: negative 9/25 Blood x 2: ng x 1 day to date  9/26 L abd wound: rare Enterococcus faecalis - sens pending; few clostridium clostridioforme  Thank you for allowing pharmacy to be a part of this patient's care.  10/25, PharmD, BCPS Please see amion for complete clinical pharmacist phone list 11/14/2020 2:40 AM

## 2020-11-14 NOTE — Progress Notes (Addendum)
eLink Physician-Brief Progress Note Patient Name: Paul Chambers DOB: 1965/05/02 MRN: [id number]   Date of Service  11/14/2020  HPI/Events of Note  64 M admitted 9/8 for necrotizing pancreatitis s/p pancreatic debridement (POD # 21). Rapid response called earlier for desaturation, intubated and went into PEA arrest. ROSC after 1 epi.  Drain culture (9/26) positive for enterococcus and clostridium shifted to piperacillin-tazobactam from ampicillin-sulbactam. Susceptibility still not available  Drain output appears fecaloid  BP 116/79  HR 136  O2 91% on norepinephrine 20/560/100/8 PEEP, peak pressure 41  eICU Interventions  Overall fluid negative on monitoring Significant insensible losses from drain and GI tract. Ongoing 1 liter saline bolus Ordered albumin 5% 25 gram Titrate down PEEP as appropriate Calling micro to follow up on susceptibility of organisms. Surgery following. Plan discussed with Dr De Hollingshead who is at bedside     Intervention Category Evaluation Type: New Patient Evaluation  Darl Pikes 11/14/2020, 2:28 AM

## 2020-11-14 NOTE — Progress Notes (Signed)
Referring Physician(s): Leary Roca PA  Supervising Physician: Pernell Dupre  Patient Status:  [facility] - In-pt  Chief Complaint:  3 abscess drain placement on 9.26.22 by Dr. Odis Luster  Subjective:  Patient laying in bed intubated and sedated.  Wife at bedside states that he is not doing well. Per chart review, patient pulled out two RLQ large bore drains around 5 PM yesterday. Patient condition continued to deteriorate, he became confused and lethargic.   Around 1:30 AM this morning, patient found unresponsive with SPO2 66% on NRB last night, was emergently intubated, then unfortunately SBP dropped to 40s and patient went into PEA. S/p successful CPR and Levophed infusion with SBP improving to 120.  Patient was transferred to ICU unit.  Allergies: Patient has no known allergies.  Medications: Prior to Admission medications   Medication Sig Start Date End Date Taking? Authorizing Provider  acetaminophen (TYLENOL) 500 MG tablet Take 500 mg by mouth every 6 (six) hours as needed for mild pain.   Yes [provider]  albuterol (VENTOLIN HFA) 108 (90 Base) MCG/ACT inhaler Inhale 1-2 puffs into the lungs every 6 (six) hours as needed for wheezing or shortness of breath.   Yes [provider]  allopurinol (ZYLOPRIM) 300 MG tablet Take 300 mg by mouth daily.   Yes [provider]  ALPRAZolam Prudy Feeler) 0.5 MG tablet Take 0.5 mg by mouth 3 (three) times daily as needed for anxiety.   Yes [provider]  amLODipine (NORVASC) 5 MG tablet Take 5 mg by mouth daily.   Yes [provider]  Fluticasone-Umeclidin-Vilant (TRELEGY ELLIPTA) 100-62.5-25 MCG/INH AEPB Inhale 1 puff into the lungs daily.   Yes [provider]  folic acid (FOLVITE) 1 MG tablet Take 1 mg by mouth daily.   Yes [provider]  furosemide (LASIX) 40 MG tablet Take 40 mg by mouth 2 (two) times daily.   Yes [provider]  Multiple Vitamins-Minerals  (PRESERVISION AREDS 2+MULTI VIT PO) Take 2 tablets by mouth daily.   Yes [provider]  Omega-3 Krill Oil 500 MG CAPS Take 1,000 mg by mouth daily.   Yes [provider]  omeprazole (PRILOSEC) 40 MG capsule Take 40 mg by mouth daily.   Yes [provider]  oxyCODONE (OXY IR/ROXICODONE) 5 MG immediate release tablet Take 2.5-5 mg by mouth every 6 (six) hours as needed for severe pain.   Yes [provider]  thiamine (VITAMIN B-1) 100 MG tablet Take 100 mg by mouth daily.   Yes [provider]     Vital Signs: BP 97/67 (BP Location: Left Arm)   Pulse 99   Temp (!) 100.8 F (38.2 C) (Bladder)   Resp (!) 28   Ht 5\' 9"  (1.753 m)   Wt 216 lb 11.4 oz (98.3 kg)   SpO2 100%   BMI 32.00 kg/m   Physical Exam Vitals and nursing note reviewed.  Constitutional:      General: He is not in acute distress.    Appearance: He is well-developed. He is ill-appearing.     Comments: Intubated and sedated.  HENT:     Head: Normocephalic.  Abdominal:     Comments: Drain Location: Drain 1 - LUQ -medial  Size: Fr size: 14 Fr Date of placement: 9.26.22  Currently to: Drain collection device: gravity Output is yellow- tan  Drain Location: Drain 2 - RUQ - perihepatic Size: Fr size: 14 Fr Date of placement: 9.26.22  Currently to: 11-03-1983  collection device: gravity Output is tan and purulent  Drain Location: Drain 3-  RLQ Size: Fr size: 14 Fr Date of placement: 9.26.22  Currently to: Drain collection device: gravity Output is tan and purulent    Musculoskeletal:     Cervical back: Normal range of motion.  Skin:    General: Skin is warm and dry.    Imaging: CT CHEST W CONTRAST  Result Date: 11/13/2020 CLINICAL DATA:  Pneumonia. Evaluate for abdominal infection. History of necrotizing pancreatitis. EXAM: CT CHEST, ABDOMEN, AND PELVIS WITH CONTRAST TECHNIQUE: Multidetector CT imaging of the chest, abdomen and pelvis was performed following the  standard protocol during bolus administration of intravenous contrast. CONTRAST:  OMNIPAQUE IOHEXOL 300 MG/ML  SOLN COMPARISON:  CT abdomen and pelvis 11/06/2020. FINDINGS: CT CHEST FINDINGS Cardiovascular: The heart is enlarged. There is no pericardial effusion. The aorta is normal in size. The main pulmonary artery is enlarged compatible with pulmonary artery hypertension. Right-sided central venous catheter tip ends at the caval atrial junction. Mediastinum/Nodes: Visualized thyroid gland is within normal limits. There are no enlarged mediastinal or hilar lymph nodes. Enteric tube is seen throughout nondilated esophagus. Lungs/Pleura: There are large bilateral pleural effusions, left greater than right. There is compressive atelectasis of the bilateral lower lobes. There is also some atelectasis of the bilateral upper lobes. The lungs are otherwise clear. Trachea and central airways appear patent. There is no pneumothorax. Musculoskeletal: No chest wall mass or suspicious bone lesions identified. CT ABDOMEN PELVIS FINDINGS Hepatobiliary: No focal liver lesions are identified. There is gallbladder wall edema, unchanged. The gallbladder is nondilated. There is no biliary ductal dilatation. Pancreas: Head and neck of the pancreas are within normal limits. 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 Extensive peripancreatic air-fluid collection is again noted extending into the mid abdomen tracking along the body and tail of the pancreas. Right-sided percutaneous drainage catheters have been removed in the interval. There is a new left-sided pigtail percutaneous catheter within the collection. Compared to the prior study this collection has not significantly changed in size. The largest areas near the pancreatic tail measuring 4.7 x 12.0 cm (previously 12.7 x 4.8 cm). Spleen: Normal in size without focal abnormality. Adrenals/Urinary Tract: Adrenal glands are unremarkable. Kidneys  are normal, without renal calculi, focal lesion, or hydronephrosis. Bladder is unremarkable. Stomach/Bowel: There is no evidence for bowel obstruction. The appendix is not seen. There is sigmoid colon diverticulosis without evidence for diverticulitis. There is new wall thickening and inflammation involving the splenic flexure of the colon. This portion of the colon abuts the. Pancreatic air-fluid collection. If fistula would be difficult to exclude. There is also diffuse wall thickening of small bowel loops with associated mesenteric edema similar to the prior study. Gastrojejunostomy tube is present. Stomach is decompressed. Rectal catheter is present. Vascular/Lymphatic: Aorta and IVC are normal in size. There are atherosclerotic calcifications of the aorta. Reproductive: Prostate is unremarkable. Other: Perihepatic air-fluid collection extending into the right paracolic gutter is unchanged in size and appearance. There is a new percutaneous pigtail catheter in the collection adjacent to the liver. Small amount of ascites is decreased, but there is stable diffuse omental edema. Air-fluid collection abutting the proximal stomach and left adrenal gland appears unchanged measuring 8.3 x 3.0 cm image 3/60. Bilateral lower retroperitoneal air-fluid collections abutting the iliopsoas muscles also appear unchanged. The largest is on the left measuring 4.9 x 5.3 cm image 3/99. Presacral edema is present. No new enhancing fluid collections  are identified. Musculoskeletal: There is diffuse body wall edema similar to the prior study. No acute fractures. No focal osseous lesion. IMPRESSION: 1. Large bilateral pleural effusions. 2. Atelectasis of the bilateral lower lobes and upper lobes. 3. Cardiomegaly. Enlarged main pulmonary artery compatible with pulmonary artery hypertension. 4. Overall, air-fluid collections throughout the abdomen and pelvis have not significantly changed in size or distribution. Right-sided  percutaneous catheters have been removed in the interval. There is a new left-sided pigtail catheter ending in the peripancreatic collection and a new right-sided pigtail catheter ending in perihepatic collection. 5. New wall thickening of the splenic flexure of the colon worrisome for reactive colitis. Note is made that this section of the colon abuts air-fluid collection near the pancreas in fistula would be difficult to exclude. 6. Diffuse wall thickening of small bowel with mesenteric edema worrisome for nonspecific enteritis. Gastrojejunostomy tube in place. 7. Stable gallbladder wall edema, possibly reactive. Correlate for acute cholecystitis. Electronically Signed   By: Darliss Cheney M.D.   On: 11/13/2020 21:58   CT ABDOMEN PELVIS W CONTRAST  Result Date: 11/13/2020 CLINICAL DATA:  Pneumonia. Evaluate for abdominal infection. History of necrotizing pancreatitis. EXAM: CT CHEST, ABDOMEN, AND PELVIS WITH CONTRAST TECHNIQUE: Multidetector CT imaging of the chest, abdomen and pelvis was performed following the standard protocol during bolus administration of intravenous contrast. CONTRAST:  OMNIPAQUE IOHEXOL 300 MG/ML  SOLN COMPARISON:  CT abdomen and pelvis 11/06/2020. FINDINGS: CT CHEST FINDINGS Cardiovascular: The heart is enlarged. There is no pericardial effusion. The aorta is normal in size. The main pulmonary artery is enlarged compatible with pulmonary artery hypertension. Right-sided central venous catheter tip ends at the caval atrial junction. Mediastinum/Nodes: Visualized thyroid gland is within normal limits. There are no enlarged mediastinal or hilar lymph nodes. Enteric tube is seen throughout nondilated esophagus. Lungs/Pleura: There are large bilateral pleural effusions, left greater than right. There is compressive atelectasis of the bilateral lower lobes. There is also some atelectasis of the bilateral upper lobes. The lungs are otherwise clear. Trachea and central airways appear  patent. There is no pneumothorax. Musculoskeletal: No chest wall mass or suspicious bone lesions identified. CT ABDOMEN PELVIS FINDINGS Hepatobiliary: No focal liver lesions are identified. There is gallbladder wall edema, unchanged. The gallbladder is nondilated. There is no biliary ductal dilatation. Pancreas: Head and neck of the pancreas are within normal limits. 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 Extensive peripancreatic air-fluid collection is again noted extending into the mid abdomen tracking along the body and tail of the pancreas. Right-sided percutaneous drainage catheters have been removed in the interval. There is a new left-sided pigtail percutaneous catheter within the collection. Compared to the prior study this collection has not significantly changed in size. The largest areas near the pancreatic tail measuring 4.7 x 12.0 cm (previously 12.7 x 4.8 cm). Spleen: Normal in size without focal abnormality. Adrenals/Urinary Tract: Adrenal glands are unremarkable. Kidneys are normal, without renal calculi, focal lesion, or hydronephrosis. Bladder is unremarkable. Stomach/Bowel: There is no evidence for bowel obstruction. The appendix is not seen. There is sigmoid colon diverticulosis without evidence for diverticulitis. There is new wall thickening and inflammation involving the splenic flexure of the colon. This portion of the colon abuts the. Pancreatic air-fluid collection. If fistula would be difficult to exclude. There is also diffuse wall thickening of small bowel loops with associated mesenteric edema similar to the prior study. Gastrojejunostomy tube is present. Stomach is decompressed. Rectal catheter is present. Vascular/Lymphatic:  Aorta and IVC are normal in size. There are atherosclerotic calcifications of the aorta. Reproductive: Prostate is unremarkable. Other: Perihepatic air-fluid collection extending into the right paracolic gutter is unchanged in  size and appearance. There is a new percutaneous pigtail catheter in the collection adjacent to the liver. Small amount of ascites is decreased, but there is stable diffuse omental edema. Air-fluid collection abutting the proximal stomach and left adrenal gland appears unchanged measuring 8.3 x 3.0 cm image 3/60. Bilateral lower retroperitoneal air-fluid collections abutting the iliopsoas muscles also appear unchanged. The largest is on the left measuring 4.9 x 5.3 cm image 3/99. Presacral edema is present. No new enhancing fluid collections are identified. Musculoskeletal: There is diffuse body wall edema similar to the prior study. No acute fractures. No focal osseous lesion. IMPRESSION: 1. Large bilateral pleural effusions. 2. Atelectasis of the bilateral lower lobes and upper lobes. 3. Cardiomegaly. Enlarged main pulmonary artery compatible with pulmonary artery hypertension. 4. Overall, air-fluid collections throughout the abdomen and pelvis have not significantly changed in size or distribution. Right-sided percutaneous catheters have been removed in the interval. There is a new left-sided pigtail catheter ending in the peripancreatic collection and a new right-sided pigtail catheter ending in perihepatic collection. 5. New wall thickening of the splenic flexure of the colon worrisome for reactive colitis. Note is made that this section of the colon abuts air-fluid collection near the pancreas in fistula would be difficult to exclude. 6. Diffuse wall thickening of small bowel with mesenteric edema worrisome for nonspecific enteritis. Gastrojejunostomy tube in place. 7. Stable gallbladder wall edema, possibly reactive. Correlate for acute cholecystitis. Electronically Signed   By: Darliss Cheney M.D.   On: 11/13/2020 21:58   DG CHEST PORT 1 VIEW  Result Date: 11/14/2020 CLINICAL DATA:  Shortness of breath, recent cardiac arrest EXAM: PORTABLE CHEST 1 VIEW COMPARISON:  Film from the previous day. FINDINGS:  Cardiac shadow is prominent but stable. Feeding catheter extends into the stomach. Endotracheal tube is noted in satisfactory position. Right-sided PICC line is noted in satisfactory position. Known perihepatic drainage catheter is noted. Increasing right-sided pleural effusion is noted. Underlying atelectatic changes are likely present. Changes are similar to that seen on recent chest CT. Known left-sided effusion is not as well appreciated. No acute bony abnormality is noted. IMPRESSION: Increasing right-sided effusion with basilar atelectasis. The left-sided effusion is not as well appreciated on today's exam. Electronically Signed   By: Alcide Clever M.D.   On: 11/14/2020 02:15   DG Chest Port 1 View  Result Date: 11/13/2020 CLINICAL DATA:  Hypoxemia, dyspnea. EXAM: PORTABLE CHEST 1 VIEW COMPARISON:  11/06/2020 FINDINGS: Feeding tube is in place, tip beyond the gastroesophageal junction. Heart size is accentuated by technique. There are bilateral pleural effusions and bibasilar opacities which partially obscure the hemidiaphragms bilaterally. Increased airspace filling opacities. RIGHT-sided PICC line has been removed. RIGHT pigtail type catheter overlies the RIGHT lung base. IMPRESSION: Increased bilateral effusions and bibasilar opacities. Electronically Signed   By: Norva Pavlov M.D.   On: 11/13/2020 15:32   [place] EKG SITE RITE  Result Date: 11/13/2020 If Site Rite image not attached, placement could not be confirmed due to current cardiac rhythm.  US Abdomen Limited RUQ (LIVER/GB)  Result Date: 11/14/2020 CLINICAL DATA:  Abdominal distension. Recent exploratory laparotomy with pancreatic debridement. EXAM: ULTRASOUND ABDOMEN LIMITED RIGHT UPPER QUADRANT COMPARISON:  CT scan November 06, 2020 FINDINGS: Gallbladder: Poorly evaluated due to open incision and bandages. The gallbladder wall does appear thickened measuring up  to 6.4 mm. Common bile duct: Diameter: 3.6 mm Liver: Diffuse increased  echogenicity. Mildly nodular contour. No focal mass. Portal vein is patent on color Doppler imaging with normal direction of blood flow towards the liver. Other: A right pleural effusion is identified. Ascites is identified as well. IMPRESSION: 1. Limited study due to open wound and bandages. 2. The gallbladder in particular is poorly evaluated. There does appear to be some gallbladder wall thickening measuring up to 6.4 mm. 3. No common bile duct dilatation. 4. Nodular contour to the liver raising the possibility of cirrhosis. 5. Right pleural effusion.  Ascites. Electronically Signed   By: Gerome Sam III M.D.   On: 11/14/2020 06:38    Labs:  CBC: Recent Labs    11/11/20 0415 11/12/20 1142 11/13/20 0054 11/14/20 0240 11/14/20 0449 11/14/20 0650  WBC 18.2* 17.3* 14.9*  --   --  22.6*  HGB 8.3* 8.9* 8.6* 9.5* 10.2* 8.7*  HCT 26.5* 28.8* 28.3* 28.0* 30.0* 29.4*  PLT 368 389 382  --   --  402*     COAGS: Recent Labs    11/09/2020 2102 10/22/20 0357 10/22/20 0826  INR 2.1* 2.1* 2.0*     BMP: Recent Labs    11/11/20 0415 11/13/20 0054 11/14/20 0240 11/14/20 0449 11/14/20 0508 11/14/20 0510  NA 134* 137 144 143 141 139  K 3.8 3.5 3.5 4.0 5.2* 4.2  CL 98 102  --   --  107 105  CO2 29 28  --   --  22 23  GLUCOSE 214* 224*  --   --  154* 148*  BUN 6 10  --   --  12 11  CALCIUM 7.9* 7.9*  --   --  8.4* 8.2*  CREATININE 0.53* 0.57*  --   --  0.43* 0.53*  GFRNONAA >60 >60  --   --  >60 >60     LIVER FUNCTION TESTS: Recent Labs    11/08/20 0557 11/09/20 0450 11/11/20 0415 11/13/20 0054 11/14/20 0508 11/14/20 0510  BILITOT 0.8  --  0.8 0.7  --  0.7  AST 20  --  26 22  --  23  ALT 14  --  14 15  --  14  ALKPHOS 76  --  91 90  --  86  PROT 6.3*  --  5.6* 5.8*  --  6.1*  ALBUMIN 1.7*   < > 1.6* 1.6* 1.6* 1.7*   < > = values in this interval not displayed.     Assessment and Plan:  55 y.o. male inpatient. History of  alcoholism, HTN, pancreatitis,  obesity.Presented to the ED at [facility] with abdominal pain. 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 S/p ex lap with debridement of infected necrotic pancreatitis and placement of  2 peripancreatic surgical drains on 9.9.22.   Found to have abdominal and pelvic fluid collections. IR placed 3 abscess drains on 9.26.22. a 14 Fr left mid hemi-abdomen with aspiration of 125 ml of purulent output. A 14 Fr perihepatic abscess drain with aspiration of 100 ml's of serosanguinous output. A 14 Fr right lower abdominal drain at the level of the mesenteric root with 50 ml of purulent fluid noted.   Patient went into PEA last night, s/p successful CPR, patient was transferred to ICU unit.  Currently sedated and intubated. Patient also pulled to large bore JP drain which were placed by surgery.  WBC uptrending 22.6 today (14.9 yesterday.) Drain cultures grew enterococcus faecalis.   Drain Location:  Drain 1 - LUQ -medial  Size: Fr size: 14 Fr Date of placement: 9.26.22  Currently to: Drain collection device: gravity Output is yellow- tan  Drain Location: Drain 2 - RUQ - perihepatic Size: Fr size: 14 Fr Date of placement: 9.26.22  Currently to: Drain collection device: gravity Output is tan and purulent  Drain Location: Drain 3-  RLQ Size: Fr size: 14 Fr Date of placement: 9.26.22  Currently to: Drain collection device: gravity Output is tan and purulent  24 hour output:  Output by Drain (mL) 11/12/20 0701 - 11/12/20 1900 11/12/20 1901 - 11/13/20 0700 11/13/20 0701 - 11/13/20 1900 11/13/20 1901 - 11/14/20 0700 11/14/20 0701 - 11/14/20 1219  Closed System Drain 1 Left;Medial;Lateral LUQ Other (Comment) 14 Fr.  10 20  0  Closed System Drain 2 Lateral RUQ Other (Comment) 14 Fr. 45 10 170 100 0  Closed System Drain 3 Right;Lateral RLQ Other (Comment) 14 Fr. Interval imaging/drain manipulation:  None since drain placement  Current examination: All 3 IR drains intact, flushes and aspirates  easily. Insertion site unremarkable. Suture and stat lock in place. Dressed appropriately.   Plan: Continue TID flushes with 5 cc NS. Record output Q shift. Dressing changes QD or PRN if soiled.  Call IR APP or on call IR MD if difficulty flushing or sudden change in drain output.  Repeat imaging/possible drain injection once output < 10 mL/QD (excluding flush material.)  Discharge planning: Please contact IR APP or on call IR MD prior to patient d/c to ensure appropriate follow up plans are in place. Typically patient will follow up with IR clinic 10-14 days post d/c for repeat imaging/possible drain injection. IR scheduler will contact patient with date/time of appointment. Patient will need to flush drain QD with 5 cc NS, record output QD, dressing changes every 2-3 days or earlier if soiled.   IR will continue to follow - please call with questions or concerns.  Electronically Signed: Willette Brace, PA-C 11/14/2020, 12:19 PM   I spent a total of 25 Minutes at the patient's bedside AND on the patient's hospital floor or unit, greater than 50% of which was counseling/coordinating care for abscess drain X 3.

## 2020-11-14 NOTE — Progress Notes (Signed)
Patient ID: Paul Chambers, male   DOB: [birth year], 55 y.o.   MRN: [id number] [facility] Surgery Progress Note  24 Days Post-Op  Subjective: CC-  Events from yesterday noted. Patient pulled out 2 surgical drains, IR drain x3 remain in place. Patient was transferred to the ICU after becoming increasingly unresponsive. Intubated and went into PEA arrest, ROSC after 1 epi.  Objective: Vital signs in last 24 hours: Temp:  [98.1 F (36.7 C)-100.1 F (37.8 C)] 99.1 F (37.3 C) (10/02 0244) Pulse Rate:  [97-137] 106 (10/02 0800) Resp:  [17-35] 31 (10/02 0800) BP: (49-152)/(36-90) 105/74 (10/02 0800) SpO2:  [65 %-98 %] 94 % (10/02 0800) FiO2 (%):  [100 %] 100 % (10/02 0727) Last BM Date: 11/14/20  Intake/Output from previous day: 10/01 0701 - 10/02 0700 In: 1123.8 [I.V.:558.3; IV Piggyback:565.6] Out: 990 [Urine:550; Drains:440] Intake/Output this shift: No intake/output data recorded.  PE: Gen:  Sedated on the vent HEENT: Cortrak in nare clamped Card:  RRR Pulm:  CTAB, mechanically ventilated Abd: protuberant but soft, IR drain x2 on the right and IR drain x1 on the left draining purulent fluid, cdi dressing over previous drain sites on the right  Lab Results:  Recent Labs    11/13/20 0054 11/14/20 0240 11/14/20 0449 11/14/20 0650  WBC 14.9*  --   --  22.6*  HGB 8.6*   < > 10.2* 8.7*  HCT 28.3*   < > 30.0* 29.4*  PLT 382  --   --  402*   < > = values in this interval not displayed.   BMET Recent Labs    11/14/20 0508 11/14/20 0510  NA 141 139  K 5.2* 4.2  CL 107 105  CO2 22 23  GLUCOSE 154* 148*  BUN 12 11  CREATININE 0.43* 0.53*  CALCIUM 8.4* 8.2*   PT/INR No results for input(s): LABPROT, INR in the last 72 hours. CMP     Component Value Date/Time   NA 139 11/14/2020 0510   K 4.2 11/14/2020 0510   CL 105 11/14/2020 0510   CO2 23 11/14/2020 0510   GLUCOSE 148 (H) 11/14/2020 0510   BUN 11 11/14/2020 0510   CREATININE 0.53 (L) 11/14/2020 0510    CALCIUM 8.2 (L) 11/14/2020 0510   PROT 6.1 (L) 11/14/2020 0510   ALBUMIN 1.7 (L) 11/14/2020 0510   AST 23 11/14/2020 0510   ALT 14 11/14/2020 0510   ALKPHOS 86 11/14/2020 0510   BILITOT 0.7 11/14/2020 0510   GFRNONAA >60 11/14/2020 0510   Lipase     Component Value Date/Time   LIPASE 29 11/13/2020 0054       Studies/Results: CT CHEST W CONTRAST  Result Date: 11/13/2020 CLINICAL DATA:  Pneumonia. Evaluate for abdominal infection. History of necrotizing pancreatitis. EXAM: CT CHEST, ABDOMEN, AND PELVIS WITH CONTRAST TECHNIQUE: Multidetector CT imaging of the chest, abdomen and pelvis was performed following the standard protocol during bolus administration of intravenous contrast. CONTRAST:  OMNIPAQUE IOHEXOL 300 MG/ML  SOLN COMPARISON:  CT abdomen and pelvis 11/06/2020. FINDINGS: CT CHEST FINDINGS Cardiovascular: The heart is enlarged. There is no pericardial effusion. The aorta is normal in size. The main pulmonary artery is enlarged compatible with pulmonary artery hypertension. Right-sided central venous catheter tip ends at the caval atrial junction. Mediastinum/Nodes: Visualized thyroid gland is within normal limits. There are no enlarged mediastinal or hilar lymph nodes. Enteric tube is seen throughout nondilated esophagus. Lungs/Pleura: There are large bilateral pleural effusions, left greater than right. There is compressive  atelectasis of the bilateral lower lobes. There is also some atelectasis of the bilateral upper lobes. The lungs are otherwise clear. Trachea and central airways appear patent. There is no pneumothorax. Musculoskeletal: No chest wall mass or suspicious bone lesions identified. CT ABDOMEN PELVIS FINDINGS Hepatobiliary: No focal liver lesions are identified. There is gallbladder wall edema, unchanged. The gallbladder is nondilated. There is no biliary ductal dilatation. Pancreas: Head and neck of the pancreas are within normal limits. Changes of pancreatic  necrosis are again identified in the body and tail. Extensive peripancreatic air-fluid collection is again noted extending into the mid abdomen tracking along the body and tail of the pancreas. Right-sided percutaneous drainage catheters have been removed in the interval. There is a new left-sided pigtail percutaneous catheter within the collection. Compared to the prior study this collection has not significantly changed in size. The largest areas near the pancreatic tail measuring 4.7 x 12.0 cm (previously 12.7 x 4.8 cm). Spleen: Normal in size without focal abnormality. Adrenals/Urinary Tract: Adrenal glands are unremarkable. Kidneys are normal, without renal calculi, focal lesion, or hydronephrosis. Bladder is unremarkable. Stomach/Bowel: There is no evidence for bowel obstruction. The appendix is not seen. There is sigmoid colon diverticulosis without evidence for diverticulitis. There is new wall thickening and inflammation involving the splenic flexure of the colon. This portion of the colon abuts the. Pancreatic air-fluid collection. If fistula would be difficult to exclude. There is also diffuse wall thickening of small bowel loops with associated mesenteric edema similar to the prior study. Gastrojejunostomy tube is present. Stomach is decompressed. Rectal catheter is present. Vascular/Lymphatic: Aorta and IVC are normal in size. There are atherosclerotic calcifications of the aorta. Reproductive: Prostate is unremarkable. Other: Perihepatic air-fluid collection extending into the right paracolic gutter is unchanged in size and appearance. There is a new percutaneous pigtail catheter in the collection adjacent to the liver. Small amount of ascites is decreased, but there is stable diffuse omental edema. Air-fluid collection abutting the proximal stomach and left adrenal gland appears unchanged measuring 8.3 x 3.0 cm image 3/60. Bilateral lower retroperitoneal air-fluid collections abutting the iliopsoas  muscles also appear unchanged. The largest is on the left measuring 4.9 x 5.3 cm image 3/99. Presacral edema is present. No new enhancing fluid collections are identified. Musculoskeletal: There is diffuse body wall edema similar to the prior study. No acute fractures. No focal osseous lesion. IMPRESSION: 1. Large bilateral pleural effusions. 2. Atelectasis of the bilateral lower lobes and upper lobes. 3. Cardiomegaly. Enlarged main pulmonary artery compatible with pulmonary artery hypertension. 4. Overall, air-fluid collections throughout the abdomen and pelvis have not significantly changed in size or distribution. Right-sided percutaneous catheters have been removed in the interval. There is a new left-sided pigtail catheter ending in the peripancreatic collection and a new right-sided pigtail catheter ending in perihepatic collection. 5. New wall thickening of the splenic flexure of the colon worrisome for reactive colitis. Note is made that this section of the colon abuts air-fluid collection near the pancreas in fistula would be difficult to exclude. 6. Diffuse wall thickening of small bowel with mesenteric edema worrisome for nonspecific enteritis. Gastrojejunostomy tube in place. 7. Stable gallbladder wall edema, possibly reactive. Correlate for acute cholecystitis. Electronically Signed   By: Darliss Cheney M.D.   On: 11/13/2020 21:58   CT ABDOMEN PELVIS W CONTRAST  Result Date: 11/13/2020 CLINICAL DATA:  Pneumonia. Evaluate for abdominal infection. History of necrotizing pancreatitis. EXAM: CT CHEST, ABDOMEN, AND PELVIS WITH CONTRAST TECHNIQUE: Multidetector CT imaging  of the chest, abdomen and pelvis was performed following the standard protocol during bolus administration of intravenous contrast. CONTRAST:  OMNIPAQUE IOHEXOL 300 MG/ML  SOLN COMPARISON:  CT abdomen and pelvis 11/06/2020. FINDINGS: CT CHEST FINDINGS Cardiovascular: The heart is enlarged. There is no pericardial effusion. The aorta  is normal in size. The main pulmonary artery is enlarged compatible with pulmonary artery hypertension. Right-sided central venous catheter tip ends at the caval atrial junction. Mediastinum/Nodes: Visualized thyroid gland is within normal limits. There are no enlarged mediastinal or hilar lymph nodes. Enteric tube is seen throughout nondilated esophagus. Lungs/Pleura: There are large bilateral pleural effusions, left greater than right. There is compressive atelectasis of the bilateral lower lobes. There is also some atelectasis of the bilateral upper lobes. The lungs are otherwise clear. Trachea and central airways appear patent. There is no pneumothorax. Musculoskeletal: No chest wall mass or suspicious bone lesions identified. CT ABDOMEN PELVIS FINDINGS Hepatobiliary: No focal liver lesions are identified. There is gallbladder wall edema, unchanged. The gallbladder is nondilated. There is no biliary ductal dilatation. Pancreas: Head and neck of the pancreas are within normal limits. 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 Extensive peripancreatic air-fluid collection is again noted extending into the mid abdomen tracking along the body and tail of the pancreas. Right-sided percutaneous drainage catheters have been removed in the interval. There is a new left-sided pigtail percutaneous catheter within the collection. Compared to the prior study this collection has not significantly changed in size. The largest areas near the pancreatic tail measuring 4.7 x 12.0 cm (previously 12.7 x 4.8 cm). Spleen: Normal in size without focal abnormality. Adrenals/Urinary Tract: Adrenal glands are unremarkable. Kidneys are normal, without renal calculi, focal lesion, or hydronephrosis. Bladder is unremarkable. Stomach/Bowel: There is no evidence for bowel obstruction. The appendix is not seen. There is sigmoid colon diverticulosis without evidence for diverticulitis. There is new wall  thickening and inflammation involving the splenic flexure of the colon. This portion of the colon abuts the. Pancreatic air-fluid collection. If fistula would be difficult to exclude. There is also diffuse wall thickening of small bowel loops with associated mesenteric edema similar to the prior study. Gastrojejunostomy tube is present. Stomach is decompressed. Rectal catheter is present. Vascular/Lymphatic: Aorta and IVC are normal in size. There are atherosclerotic calcifications of the aorta. Reproductive: Prostate is unremarkable. Other: Perihepatic air-fluid collection extending into the right paracolic gutter is unchanged in size and appearance. There is a new percutaneous pigtail catheter in the collection adjacent to the liver. Small amount of ascites is decreased, but there is stable diffuse omental edema. Air-fluid collection abutting the proximal stomach and left adrenal gland appears unchanged measuring 8.3 x 3.0 cm image 3/60. Bilateral lower retroperitoneal air-fluid collections abutting the iliopsoas muscles also appear unchanged. The largest is on the left measuring 4.9 x 5.3 cm image 3/99. Presacral edema is present. No new enhancing fluid collections are identified. Musculoskeletal: There is diffuse body wall edema similar to the prior study. No acute fractures. No focal osseous lesion. IMPRESSION: 1. Large bilateral pleural effusions. 2. Atelectasis of the bilateral lower lobes and upper lobes. 3. Cardiomegaly. Enlarged main pulmonary artery compatible with pulmonary artery hypertension. 4. Overall, air-fluid collections throughout the abdomen and pelvis have not significantly changed in size or distribution. Right-sided percutaneous catheters have been removed in the interval. There is a new left-sided pigtail catheter ending in the peripancreatic collection and a new right-sided pigtail catheter ending in perihepatic collection. 5.  New wall thickening of the splenic flexure of the colon  worrisome for reactive colitis. Note is made that this section of the colon abuts air-fluid collection near the pancreas in fistula would be difficult to exclude. 6. Diffuse wall thickening of small bowel with mesenteric edema worrisome for nonspecific enteritis. Gastrojejunostomy tube in place. 7. Stable gallbladder wall edema, possibly reactive. Correlate for acute cholecystitis. Electronically Signed   By: Darliss Cheney M.D.   On: 11/13/2020 21:58   DG CHEST PORT 1 VIEW  Result Date: 11/14/2020 CLINICAL DATA:  Shortness of breath, recent cardiac arrest EXAM: PORTABLE CHEST 1 VIEW COMPARISON:  Film from the previous day. FINDINGS: Cardiac shadow is prominent but stable. Feeding catheter extends into the stomach. Endotracheal tube is noted in satisfactory position. Right-sided PICC line is noted in satisfactory position. Known perihepatic drainage catheter is noted. Increasing right-sided pleural effusion is noted. Underlying atelectatic changes are likely present. Changes are similar to that seen on recent chest CT. Known left-sided effusion is not as well appreciated. No acute bony abnormality is noted. IMPRESSION: Increasing right-sided effusion with basilar atelectasis. The left-sided effusion is not as well appreciated on today's exam. Electronically Signed   By: Alcide Clever M.D.   On: 11/14/2020 02:15   DG Chest Port 1 View  Result Date: 11/13/2020 CLINICAL DATA:  Hypoxemia, dyspnea. EXAM: PORTABLE CHEST 1 VIEW COMPARISON:  11/06/2020 FINDINGS: Feeding tube is in place, tip beyond the gastroesophageal junction. Heart size is accentuated by technique. There are bilateral pleural effusions and bibasilar opacities which partially obscure the hemidiaphragms bilaterally. Increased airspace filling opacities. RIGHT-sided PICC line has been removed. RIGHT pigtail type catheter overlies the RIGHT lung base. IMPRESSION: Increased bilateral effusions and bibasilar opacities. Electronically Signed   By:  Norva Pavlov M.D.   On: 11/13/2020 15:32   [place] EKG SITE RITE  Result Date: 11/13/2020 If Site Rite image not attached, placement could not be confirmed due to current cardiac rhythm.  US Abdomen Limited RUQ (LIVER/GB)  Result Date: 11/14/2020 CLINICAL DATA:  Abdominal distension. Recent exploratory laparotomy with pancreatic debridement. EXAM: ULTRASOUND ABDOMEN LIMITED RIGHT UPPER QUADRANT COMPARISON:  CT scan November 06, 2020 FINDINGS: Gallbladder: Poorly evaluated due to open incision and bandages. The gallbladder wall does appear thickened measuring up to 6.4 mm. Common bile duct: Diameter: 3.6 mm Liver: Diffuse increased echogenicity. Mildly nodular contour. No focal mass. Portal vein is patent on color Doppler imaging with normal direction of blood flow towards the liver. Other: A right pleural effusion is identified. Ascites is identified as well. IMPRESSION: 1. Limited study due to open wound and bandages. 2. The gallbladder in particular is poorly evaluated. There does appear to be some gallbladder wall thickening measuring up to 6.4 mm. 3. No common bile duct dilatation. 4. Nodular contour to the liver raising the possibility of cirrhosis. 5. Right pleural effusion.  Ascites. Electronically Signed   By: Gerome Sam III M.D.   On: 11/14/2020 06:38    Anti-infectives: Anti-infectives (From admission, onward)    Start     Dose/Rate Route Frequency Ordered Stop   11/14/20 1200  vancomycin (VANCOREADY) IVPB 750 mg/150 mL        750 mg 150 mL/hr over 60 Minutes Intravenous Every 8 hours 11/14/20 0251     11/14/20 0330  vancomycin (VANCOREADY) IVPB 2000 mg/400 mL        2,000 mg 200 mL/hr over 120 Minutes Intravenous  Once 11/14/20 0251 11/14/20 0519   11/12/20 1900  piperacillin-tazobactam (ZOSYN)  IVPB 3.375 g        3.375 g 12.5 mL/hr over 240 Minutes Intravenous Every 8 hours 11/12/20 1538     11/09/20 1730  Ampicillin-Sulbactam (UNASYN) 3 g in sodium chloride 0.9 % 100 mL IVPB   Status:  Discontinued        3 g 200 mL/hr over 30 Minutes Intravenous Every 6 hours 11/09/20 1617 11/12/20 1538   11/06/20 1545  piperacillin-tazobactam (ZOSYN) IVPB 3.375 g  Status:  Discontinued        3.375 g 12.5 mL/hr over 240 Minutes Intravenous Every 8 hours 11/06/20 1453 11/09/20 1616   11/01/20 0900  levofloxacin (LEVAQUIN) IVPB 750 mg  Status:  Discontinued        750 mg 100 mL/hr over 90 Minutes Intravenous Every 24 hours 11/01/20 0824 11/06/20 1408   11/01/20 0900  metroNIDAZOLE (FLAGYL) IVPB 500 mg  Status:  Discontinued        500 mg 100 mL/hr over 60 Minutes Intravenous Every 12 hours 11/01/20 0824 11/06/20 1408   10/29/20 2200  meropenem (MERREM) 1 g in sodium chloride 0.9 % 100 mL IVPB  Status:  Discontinued        1 g 200 mL/hr over 30 Minutes Intravenous Every 8 hours 10/29/20 1428 11/01/20 0824   10/28/20 1615  meropenem (MERREM) 2 g in sodium chloride 0.9 % 100 mL IVPB  Status:  Discontinued        2 g 200 mL/hr over 30 Minutes Intravenous Every 8 hours 10/28/20 1515 10/29/20 1428   10/28/20 1445  metroNIDAZOLE (FLAGYL) IVPB 500 mg  Status:  Discontinued        500 mg 100 mL/hr over 60 Minutes Intravenous Every 12 hours 10/28/20 1348 10/28/20 1505   10/25/20 2300  fluconazole (DIFLUCAN) IVPB 400 mg        400 mg 100 mL/hr over 120 Minutes Intravenous Every 24 hours 10/25/20 0742 10/29/20 0718   10/24/20 2300  fluconazole (DIFLUCAN) IVPB 200 mg  Status:  Discontinued        200 mg 100 mL/hr over 60 Minutes Intravenous Every 24 hours 10/24/20 0943 10/25/20 0742   10/22/20 1800  vancomycin (VANCOREADY) IVPB 1500 mg/300 mL  Status:  Discontinued        1,500 mg 150 mL/hr over 120 Minutes Intravenous Every 24 hours 10/20/2020 2225 10/22/20 0759   10/22/20 0900  linezolid (ZYVOX) IVPB 600 mg  Status:  Discontinued        600 mg 300 mL/hr over 60 Minutes Intravenous Every 12 hours 10/22/20 0800 10/27/20 0852   10/22/20 0300  piperacillin-tazobactam (ZOSYN) IVPB 3.375 g   Status:  Discontinued        3.375 g 12.5 mL/hr over 240 Minutes Intravenous Every 8 hours 11/05/2020 2225 10/28/20 1514   10/20/2020 2335  metroNIDAZOLE (FLAGYL) IVPB 500 mg  Status:  Discontinued        500 mg 100 mL/hr over 60 Minutes Intravenous Every 12 hours 10/29/2020 2142 10/22/20 0747   10/19/2020 2248  fluconazole (DIFLUCAN) IVPB 400 mg  Status:  Discontinued        400 mg 100 mL/hr over 120 Minutes Intravenous Every 24 hours 10/19/2020 2142 10/24/20 0943   10/22/2020 1748  vancomycin (VANCOREADY) IVPB 2000 mg/400 mL        2,000 mg 200 mL/hr over 120 Minutes Intravenous  Once 11/05/2020 1706 11/09/2020 2113   10/17/2020 1715  piperacillin-tazobactam (ZOSYN) IVPB 3.375 g        3.375 g  100 mL/hr over 30 Minutes Intravenous  Once 11/05/2020 1706 November 05, 2020 1903        Assessment/Plan POD 23 s/p ex lap with pancreatic debridement for infected necrotic pancreatitis by Dr. Sheliah Hatch on 9/9 - Superior drain under the mesocolon along with the head of the pancreas - Inferior drain under the mesocolon along the left upper abdomen and what is likely a position inferior to the pancreas - CT 9/24 w/ gas and fluid collections that are grossly stable in the retroperitoneum and are drained by JP tubes. Cont drains - CT 9/24 also with mildly increased amount of fluid is noted around the liver with an increased amount of air seen in the non dependent portion concerning for persistent bowel leak. - pt is having bowel function and IR drains placed 9/26 do not appear feculent. Low clinical suspicion for bowel leak - s/p IR drains 9/26. Cx with enterococcus faecalis, sensitivities pending  - BID WTD - Patient pulled out surgical drain x2 on 10/1, he still has IR drain x3 in place.  - CT 10/1 air-fluid collections throughout the abdomen and pelvis have not significantly changed in size or distribution; New wall thickening of the splenic flexure of the colon worrisome for reactive colitis - Previous drain sites leaking  - change dressings PRN saturation. Will continue to monitor abdominal exam. May need IR assistance placing new drains at some point. No plans for surgical intervention at this time.  I spoke to the patient's wife at bedside.   FEN: NPO, hold postpyloric TF ID: currently on zosyn and vancomycin VTE: SCDs, heparin subq Foley - placed 10/2   - Per primary -  VDRF - reintubated 10/2, PEA arrest post-intubation Septic shock, possible aspiration B/l pleural effusions Abl anemia  ETOH use  AKI T2DM   LOS: 24 days    Franne Forts, [facility] 11/14/2020, 8:44 AM Please see Amion for pager number during day hours 7:00am-4:30pm

## 2020-11-14 NOTE — Procedures (Signed)
Cardiopulmonary Resuscitation Note  Paul Chambers  [id number]  26-May-1965  Date:11/14/20  Time:2:08 AM   Provider Performing:Preesha Benjamin Hezzie Bump   Procedure: Cardiopulmonary Resuscitation [phone])  Indication(s) Loss of Pulse  Consent N/A  Anesthesia N/A   Time Out N/A   Sterile Technique Hand hygiene, gloves   Procedure Description CODE BLUE shortly after intubation. Time 0151. Initial rhythm was PEA/Asystole. Patient received high quality chest compressions for 2 minutes with defibrillation or cardioversion when appropriate. Epinephrine was administered every 3 minutes as directed by time Biomedical engineer. Additional pharmacologic interventions included sodium bicarbonate.  Return of spontaneous circulation was achieved.  Family to be notified.   Complications/Tolerance N/A   EBL N/A   Specimen(s) N/A  Estimated time to ROSC: 2 minutes  [name], MSN, APRN, AGACNP-BC [facility]  11/14/2020 , 2:10 AM  Please see [email or web address] for pager details  If no response, please call [phone] After hours, please call Elink at [phone]

## 2020-11-14 NOTE — Plan of Care (Signed)
  Problem: Clinical Measurements: Goal: Respiratory complications will improve Outcome: Progressing Goal: Cardiovascular complication will be avoided Outcome: Progressing   Problem: Activity: Goal: Risk for activity intolerance will decrease Outcome: Progressing   Problem: Nutrition: Goal: Adequate nutrition will be maintained Outcome: Progressing   Problem: Coping: Goal: Level of anxiety will decrease Outcome: Progressing   Problem: Elimination: Goal: Will not experience complications related to bowel motility Outcome: Progressing Goal: Will not experience complications related to urinary retention Outcome: Progressing   Problem: Pain Managment: Goal: General experience of comfort will improve Outcome: Progressing   Problem: Safety: Goal: Ability to remain free from injury will improve Outcome: Progressing   Problem: Skin Integrity: Goal: Risk for impaired skin integrity will decrease Outcome: Progressing   

## 2020-11-14 NOTE — Consult Note (Signed)
NAME:  Paul Chambers, MRN:  [id number], DOB:  11-Apr-1965, LOS: 24 ADMISSION DATE:  10/22/2020, CONSULTATION DATE:  11/14/2020 REFERRING MD:  Dr. Tereasa Coop, Reason for consult: Acute respiratory failure  History of Present Illness:  Patient is encephalopathic and/or intubated. Therefore history has been obtained from chart review.   Paul Chambers, is a 55 y.o. male, who was admitted to [facility] on 11/04/2020 with abdominal pain.  They have a pertinent pmx of alcoholism, HTN, gout, pancreatitis, tobacco abuse.  He was found to have pneumoperitoneum without clear perforation. He was started on vanc and zosyn. He underwent ex lap on 9/9 and was found to have a large amount of necrotic fluid surrounding his pancreas, which was concerning for necrotic infected pancreas, two drains were placed. No bowel perforation was found.   His hospital course has been complicated by acute respiratory failure secondary to possible aspiration pneumonia, ABLA requiring transfusion, delirium, severe malnutrition requiring TPN. Was weaned of TPN and TF was started. He continued to spike fevers and was tachycardic despite antibiotics. ID consulted on 9/24 and changed abx to IV zosyn. Repeat CT demonsdtated new perihepatic fluid. On 9/26 IR placed 3 new drains. Fluid cultures resulted with enterococcus and clostridium.  The early morning of 10/2 PCCM was called to bedside for O2 sats in the 70's with the patient not responding to noxious stimuli. The patient was emergently intubated. Shortly after the patient had a PEA arrest with 2 minutes of CPR until ROSC. 1 epi was given. The patient was transferred to ICU.  Pertinent  Medical History  alcoholism, HTN, gout, pancreatitis, tobacco abuse.  Significant Hospital Events: Including procedures, antibiotic start and stop dates in addition to other pertinent events   9/8 - Presented to [facility] with abdominal pain. CT A/P with extensive pneumoperitoneum 9/9 - Ex-lap (no colonic perforation  discovered, large necrotic fluid present surrounding pancreas. Transferred to ICU post-op, intubated and sedated.  9/10-received 1 unit PRBC for hemoglobin 6.7, went back on Levophed for short period of time 9/12 - Extubated to Venturi mask 9/13 - Off Fentanyl and Propofol gtt 9/15 TXR to [facility] 9/24 IV Zosyn 9/26 CT a/p showed perihepatic fluid, IR Drains placed 10/2 PCCM consulted for respiratory failure, intubated, PEA arrest, 2 minutes CPR, TXR to icu  Interim History / Subjective:  See above  Unable to obtain subjective evaluation due to patient status  Objective   Blood pressure 118/73, pulse 97, temperature 99.9 F (37.7 C), temperature source Oral, resp. rate (!) 24, height 5\' 9"  (1.753 m), weight 98.3 kg, SpO2 90 %.        Intake/Output Summary (Last 24 hours) at 11/14/2020 01/14/2021 Last data filed at 11/13/2020 1515 Gross per 24 hour  Intake 1610.75 ml  Output 685 ml  Net 925.75 ml   Filed Weights   11/04/20 0603 11/08/20 0500 11/11/20 0500  Weight: 100.2 kg 96.1 kg 98.3 kg    Examination: General: In bed, NAD, appears comfortable HEENT: MM pink/moist, anicteric, atraumatic Neuro: RASS -4, PERRL 41mm, sedated CV: S1S2, ST, no m/r/g appreciated PULM:  rhonchi  in the upper lobes, rhonchi  in the lower lobes, trachea midline, chest expansion symmetric GI: distended, bsx4 hypoactive, firm, x3 JP drains, yellow foul smelling drainage from jp sites Extremities: warm/dry, no pretibial edema, capillary refill less than 3 seconds  Skin: see below      Resolved Hospital Problem list   Transaminitis AKI  Assessment & Plan:   Acute respiratory failure with hypoxia Suspect due  to possible aspiration pnu, atelectasis. CXR with stable ETT, BL pleural effusions on 10/1 CT -LTVV strategy with tidal volumes of 4-8 cc/kg ideal body weight -Goal plateau pressures less than 30 and driving pressures less than 15 -Wean PEEP/FiO2 for SpO2 92-98% -VAP bundle -Daily SAT and SBT -PAD  bundle with Precedex gtt and fentanyl push -RASS goal 0 to -1 -Follow up ABG in 1 hour -ABX as discussed below  In hospital PEA arrest On 10/2. Suspect secondary to hypoxia/hypotension. Occurred shortly after intubation. 2 min cpr. 1 epi given RSI drugs limiting post ROSC neuro exam. -Follow up neuro exam -goal normothermia -Check 12 lead -Check BMP, MG, Phos  Septic shock secondary to infected necrotizing pancreatitis, possible aspiration pneumonia Intra abdominal abscess- culture growth with enterococcus, clostridium WBC 17.3>14.9, lipase 29, Patient removed x2 JP drains on 10/1, x3 JP drains in place CT abdomen 10/1 with air-fluid collections throughout the abdomen. Air fluid collection near pancreas. Gallbladder wall edema.   -ID consulted appreciate assistance -Continue zosyn, start vancomycin -Goal MAP greater than 65. Give 1 L IVF Start NE in PICC. Titrate to goal -General surgery consulted and following. Surgical site management and drain management per GS. Appreciate GS opinion in AM regarding new abdominal CT. -Check lactate  -RUQ [place].  -Palliative care consult  Acute metabolic encephalopathy Suspect secondary to sepsis and respiratory failure. Ammonia 155 -follow up and monitor neuro exam post ventilator iniation -consider head ct based on neuro exam -Continue lactulose   ABLA -Transfuse PRBC if HBG less than 7 -Obtain AM CBC to trend H&H -Monitor for signs of bleeding  DM2 -continue SSI  Diarrhea Suspect secondary to TF -continue rectal tube  HX ETOH abuse -alcohol cessation education when appropriate  Severe protein calorie malnutrition -continue TF   Best Practice (right click and "Reselect all SmartList Selections" daily)   Diet/type: NPO DVT prophylaxis: prophylactic heparin  GI prophylaxis: PPI Lines: yes and it is still needed Foley:  N/A Code Status:  full code Last date of multidisciplinary goals of care discussion [Pending]  Labs    CBC: Recent Labs  Lab 11/09/20 0500 11/10/20 0430 11/11/20 0415 11/12/20 1142 11/13/20 0054  WBC 13.4* 15.3* 18.2* 17.3* 14.9*  NEUTROABS  --   --   --   --  12.4*  HGB 8.1* 8.0* 8.3* 8.9* 8.6*  HCT 25.8* 26.0* 26.5* 28.8* 28.3*  MCV 102.4* 102.8* 101.1* 103.2* 102.9*  PLT 350 366 368 389 382    Basic Metabolic Panel: Recent Labs  Lab 11/08/20 0557 11/09/20 0450 11/09/20 0500 11/10/20 0430 11/11/20 0415 11/13/20 0054  NA 135 133*  --  130* 134* 137  K 4.0 3.5  --  3.6 3.8 3.5  CL 97* 96*  --  96* 98 102  CO2 30 30  --  30 29 28   GLUCOSE 172* 180*  --  147* 214* 224*  BUN 16 13  --  9 6 10   CREATININE 0.70 0.61  --  0.52* 0.53* 0.57*  CALCIUM 7.9* 7.9*  --  7.8* 7.9* 7.9*  MG 2.0  --  1.9 2.0 1.9 2.0  PHOS 4.0 3.2  --  2.7 2.5 2.1*   GFR: Estimated Creatinine Clearance: 124.8 mL/min (A) (by C-G formula based on SCr of 0.57 mg/dL (L)). Recent Labs  Lab 11/10/20 0430 11/11/20 0415 11/12/20 1142 11/13/20 0054  WBC 15.3* 18.2* 17.3* 14.9*    Liver Function Tests: Recent Labs  Lab 11/07/20 0440 11/08/20 0557 11/09/20 0450 11/10/20 0430 11/11/20  0415 11/13/20 0054  AST 24 20  --   --  26 22  ALT 15 14  --   --  14 15  ALKPHOS 82 76  --   --  91 90  BILITOT 0.7 0.8  --   --  0.8 0.7  PROT 5.9* 6.3*  --   --  5.6* 5.8*  ALBUMIN 1.9* 1.7* 1.6* 1.5* 1.6* 1.6*   Recent Labs  Lab 11/13/20 0054  LIPASE 29   Recent Labs  Lab 11/07/20 0440 11/13/20 0054  AMMONIA 38* 155*    ABG    Component Value Date/Time   PHART 7.368 10/24/2020 1154   PCO2ART 46.2 10/24/2020 1154   PO2ART 71 (L) 10/24/2020 1154   HCO3 26.6 10/24/2020 1154   TCO2 28 10/24/2020 1154   O2SAT 93.0 10/24/2020 1154     Coagulation Profile: No results for input(s): INR, PROTIME in the last 168 hours.  Cardiac Enzymes: Recent Labs  Lab 11/07/20 0440  CKTOTAL 10*    HbA1C: Hgb A1c MFr Bld  Date/Time Value Ref Range Status  11/09/2020 10:13 PM 6.8 (H) 4.8 - 5.6 % Final     Comment:    (NOTE) Pre diabetes:          5.7%-6.4%  Diabetes:              >6.4%  Glycemic control for   <7.0% adults with diabetes     CBG: Recent Labs  Lab 11/13/20 0842 11/13/20 1240 11/13/20 1736 11/13/20 2149 11/14/20 0048  GLUCAP 221* 204* 185* 190* 191*    Review of Systems:   Unable to obtain due to patient status.  Past Medical History:  He,  has a past medical history of Alcoholism (HCC), Gout, Hypertension, Pancreatitis, and Tobacco abuse.   Surgical History:   Past Surgical History:  Procedure Laterality Date   LAPAROSCOPY N/A 11/02/2020   Procedure: LAPAROSCOPY DIAGNOSTIC;  Surgeon: Sheliah Hatch De Blanch, MD;  Location: [facility] OR;  Service: General;  Laterality: N/A;   LAPAROTOMY N/A 10/28/2020   Procedure: EXPLORATORY LAPAROTOMY AND PANCREATIC DEBRIDEMENT;  Surgeon: Sheliah Hatch, De Blanch, MD;  Location: [facility] OR;  Service: General;  Laterality: N/A;     Social History:   reports that he has been smoking cigarettes. He does not have any smokeless tobacco history on file. He reports that he does not currently use alcohol.   Family History:  His family history includes Diverticulitis in his mother; Liver cancer in his maternal aunt.   Allergies No Known Allergies   Home Medications  Prior to Admission medications   Medication Sig Start Date End Date Taking? Authorizing Provider  acetaminophen (TYLENOL) 500 MG tablet Take 500 mg by mouth every 6 (six) hours as needed for mild pain.   Yes [provider]  albuterol (VENTOLIN HFA) 108 (90 Base) MCG/ACT inhaler Inhale 1-2 puffs into the lungs every 6 (six) hours as needed for wheezing or shortness of breath.   Yes [provider]  allopurinol (ZYLOPRIM) 300 MG tablet Take 300 mg by mouth daily.   Yes [provider]  ALPRAZolam Prudy Feeler) 0.5 MG tablet Take 0.5 mg by mouth 3 (three) times daily as needed for anxiety.   Yes [provider]  amLODipine (NORVASC) 5 MG tablet Take 5 mg  by mouth daily.   Yes [provider]  Fluticasone-Umeclidin-Vilant (TRELEGY ELLIPTA) 100-62.5-25 MCG/INH AEPB Inhale 1 puff into the lungs daily.   Yes [provider]  folic acid (FOLVITE) 1 MG  tablet Take 1 mg by mouth daily.   Yes [provider]  furosemide (LASIX) 40 MG tablet Take 40 mg by mouth 2 (two) times daily.   Yes [provider]  Multiple Vitamins-Minerals (PRESERVISION AREDS 2+MULTI VIT PO) Take 2 tablets by mouth daily.   Yes [provider]  Omega-3 Krill Oil 500 MG CAPS Take 1,000 mg by mouth daily.   Yes [provider]  omeprazole (PRILOSEC) 40 MG capsule Take 40 mg by mouth daily.   Yes [provider]  oxyCODONE (OXY IR/ROXICODONE) 5 MG immediate release tablet Take 2.5-5 mg by mouth every 6 (six) hours as needed for severe pain.   Yes [provider]  thiamine (VITAMIN B-1) 100 MG tablet Take 100 mg by mouth daily.   Yes [provider]     Critical care time: 35 minutes     [name], MSN, APRN, AGACNP-BC [facility]  11/14/2020 , 2:12 AM  Please see [email or web address] for pager details  If no response, please call [phone] After hours, please call Elink at [phone]'

## 2020-11-14 NOTE — Significant Event (Signed)
Rapid Response Event Note    Reason for Call :  SpO2-66% on NRB, pt unresponsive  Initial Focused Assessment:  Pt lying in bed with eyes closed, responsive only to deep painful stimulation, SpO2-66% on NRB. Breathing is labored. Pt has a weak gag reflex. Pupils 5, equal, and brisk. Lungs with rhonchi/rales/diminsihed t/o.   T-99.9, HR-138, BP-132/80, RR-37, SpO2-66% on NRB.   Interventions:  CBG-191 TRH MD and PCCM to bedside: Pt intubated(2mg  versed, fentanyl, 20mg  etomidate, 100mg  rocuronium)  4 minutes after intubation, SBP dropped to 40s and pt went into PEA. CPR was performed x 2 min and pt was given 1amp epi and 1 amp bicarb. ROSC was achieved after 2 minutes. Levophed gtt then started at 20mg  with SBP increasing to 120. Pt was then txed to 2M04.  Plan of Care:  Pt txed to 2M04.   Event Summary:   MD Notified: Dr. notifed and came to bedside, PCCM Mannam and 10-16-1980 consulted and came to bedside.  Call Time:0120 Arrival 10-16-1980 End Time: 0230 Toniann Fail, RN

## 2020-11-14 NOTE — Procedures (Signed)
Intubation Procedure Note  Paul Chambers  [id number]  February 13, 1966  Date:11/14/20  Time:2:07 AM   Provider Performing:Lemoyne Scarpati [name]    Procedure: Intubation (31500)  Indication(s) Respiratory Failure  Consent Unable to obtain consent due to emergent nature of procedure.   Anesthesia Etomidate, Versed, Fentanyl, and Rocuronium   Time Out Verified patient identification, verified procedure, site/side was marked, verified correct patient position, special equipment/implants available, medications/allergies/relevant history reviewed, required imaging and test results available.   Sterile Technique Usual hand hygeine, masks, and gloves were used   Procedure Description Patient positioned in bed supine.  Sedation given as noted above.  Patient was intubated with endotracheal tube using Glidescope.  View was Grade 1 full glottis .  Number of attempts was 1.  Colorimetric CO2 detector was consistent with tracheal placement.   Complications/Tolerance None Chest X-ray is ordered to verify placement.   EBL Minimal   Specimen(s) None  [name], MSN, APRN, AGACNP-BC [facility]  11/14/2020 , 2:07 AM  Please see [email or web address] for pager details  If no response, please call [phone] After hours, please call Elink at [phone]

## 2020-11-15 DIAGNOSIS — K8591 Acute pancreatitis with uninfected necrosis, unspecified: Secondary | ICD-10-CM | POA: Diagnosis not present

## 2020-11-15 LAB — GLUCOSE, CAPILLARY
Glucose-Capillary: 100 mg/dL — ABNORMAL HIGH (ref 70–99)
Glucose-Capillary: 101 mg/dL — ABNORMAL HIGH (ref 70–99)
Glucose-Capillary: 132 mg/dL — ABNORMAL HIGH (ref 70–99)
Glucose-Capillary: 143 mg/dL — ABNORMAL HIGH (ref 70–99)
Glucose-Capillary: 145 mg/dL — ABNORMAL HIGH (ref 70–99)
Glucose-Capillary: 163 mg/dL — ABNORMAL HIGH (ref 70–99)
Glucose-Capillary: 193 mg/dL — ABNORMAL HIGH (ref 70–99)
Glucose-Capillary: 224 mg/dL — ABNORMAL HIGH (ref 70–99)
Glucose-Capillary: 68 mg/dL — ABNORMAL LOW (ref 70–99)

## 2020-11-15 LAB — COMPREHENSIVE METABOLIC PANEL
ALT: 15 U/L (ref 0–44)
AST: 19 U/L (ref 15–41)
Albumin: 1.7 g/dL — ABNORMAL LOW (ref 3.5–5.0)
Alkaline Phosphatase: 73 U/L (ref 38–126)
Anion gap: 7 (ref 5–15)
BUN: 15 mg/dL (ref 6–20)
CO2: 29 mmol/L (ref 22–32)
Calcium: 8.3 mg/dL — ABNORMAL LOW (ref 8.9–10.3)
Chloride: 106 mmol/L (ref 98–111)
Creatinine, Ser: 0.58 mg/dL — ABNORMAL LOW (ref 0.61–1.24)
GFR, Estimated: >60 mL/min (ref >60)
Glucose, Bld: 148 mg/dL — ABNORMAL HIGH (ref 70–99)
Potassium: 3.7 mmol/L (ref 3.5–5.1)
Sodium: 142 mmol/L (ref 135–145)
Total Bilirubin: 0.6 mg/dL (ref 0.3–1.2)
Total Protein: 6 g/dL — ABNORMAL LOW (ref 6.5–8.1)

## 2020-11-15 LAB — CBC
HCT: 26.9 % — ABNORMAL LOW (ref 39.0–52.0)
Hemoglobin: 8 g/dL — ABNORMAL LOW (ref 13.0–17.0)
MCH: 31.6 pg (ref 26.0–34.0)
MCHC: 29.7 g/dL — ABNORMAL LOW (ref 30.0–36.0)
MCV: 106.3 fL — ABNORMAL HIGH (ref 80.0–100.0)
Platelets: 263 10*3/uL (ref 150–400)
RBC: 2.53 MIL/uL — ABNORMAL LOW (ref 4.22–5.81)
RDW: 18.8 % — ABNORMAL HIGH (ref 11.5–15.5)
WBC: 16.4 10*3/uL — ABNORMAL HIGH (ref 4.0–10.5)
nRBC: 0.1 % (ref 0.0–0.2)

## 2020-11-15 LAB — MAGNESIUM: Magnesium: 2.1 mg/dL (ref 1.7–2.4)

## 2020-11-15 LAB — PHOSPHORUS: Phosphorus: 3.9 mg/dL (ref 2.5–4.6)

## 2020-11-15 LAB — TRIGLYCERIDES: Triglycerides: 80 mg/dL (ref <150)

## 2020-11-15 MED ORDER — FUROSEMIDE 10 MG/ML IJ SOLN
40.0000 mg | Freq: Once | INTRAMUSCULAR | Status: AC
  Administered 2020-11-15: 40 mg via INTRAVENOUS
  Filled 2020-11-15: qty 4

## 2020-11-15 MED ORDER — CHLORHEXIDINE GLUCONATE 0.12% ORAL RINSE (MEDLINE KIT)
15.0000 mL | Freq: Two times a day (BID) | OROMUCOSAL | Status: DC
  Administered 2020-11-15 – 2020-11-27 (×25): 15 mL via OROMUCOSAL

## 2020-11-15 MED ORDER — LINEZOLID 600 MG/300ML IV SOLN
600.0000 mg | Freq: Two times a day (BID) | INTRAVENOUS | Status: DC
  Administered 2020-11-15 – 2020-11-19 (×9): 600 mg via INTRAVENOUS
  Filled 2020-11-15 (×9): qty 300

## 2020-11-15 MED ORDER — POTASSIUM CHLORIDE 10 MEQ/100ML IV SOLN
10.0000 meq | INTRAVENOUS | Status: AC
Start: 2020-11-15 — End: 2020-11-15
  Administered 2020-11-15 (×4): 10 meq via INTRAVENOUS
  Filled 2020-11-15 (×4): qty 100

## 2020-11-15 MED ORDER — SPIRONOLACTONE 25 MG PO TABS
25.0000 mg | ORAL_TABLET | Freq: Every day | ORAL | Status: DC
  Administered 2020-11-16: 25 mg
  Filled 2020-11-15 (×2): qty 1

## 2020-11-15 MED ORDER — SPIRONOLACTONE 25 MG PO TABS: 25.0000 mg | ORAL_TABLET | Freq: Every day | ORAL | Status: DC

## 2020-11-15 MED ORDER — ORAL CARE MOUTH RINSE
15.0000 mL | OROMUCOSAL | Status: DC
  Administered 2020-11-15 – 2020-11-27 (×120): 15 mL via OROMUCOSAL

## 2020-11-15 NOTE — Progress Notes (Signed)
Referring Physician(s): Leary Roca, [place]  Supervising Physician: Marliss Coots  Patient Status:  [facility] - In-pt  Chief Complaint: Follow up intra-abdominal drains x 3  Subjective:  Patient remains intubated but alert, agitated, per wife he wants to get out of bed and she is encouraging him to not talk and stay calm. When asked if he is having pain anywhere he rolls his eyes and mouths something I cannot understand. His wife is wondering if the 2 drains that he pulled out will need to be replaced.  Allergies: Patient has no known allergies.  Medications: Prior to Admission medications   Medication Sig Start Date End Date Taking? Authorizing Provider  acetaminophen (TYLENOL) 500 MG tablet Take 500 mg by mouth every 6 (six) hours as needed for mild pain.   Yes [provider]  albuterol (VENTOLIN HFA) 108 (90 Base) MCG/ACT inhaler Inhale 1-2 puffs into the lungs every 6 (six) hours as needed for wheezing or shortness of breath.   Yes [provider]  allopurinol (ZYLOPRIM) 300 MG tablet Take 300 mg by mouth daily.   Yes [provider]  ALPRAZolam Prudy Feeler) 0.5 MG tablet Take 0.5 mg by mouth 3 (three) times daily as needed for anxiety.   Yes [provider]  amLODipine (NORVASC) 5 MG tablet Take 5 mg by mouth daily.   Yes [provider]  Fluticasone-Umeclidin-Vilant (TRELEGY ELLIPTA) 100-62.5-25 MCG/INH AEPB Inhale 1 puff into the lungs daily.   Yes [provider]  folic acid (FOLVITE) 1 MG tablet Take 1 mg by mouth daily.   Yes [provider]  furosemide (LASIX) 40 MG tablet Take 40 mg by mouth 2 (two) times daily.   Yes [provider]  Multiple Vitamins-Minerals (PRESERVISION AREDS 2+MULTI VIT PO) Take 2 tablets by mouth daily.   Yes [provider]  Omega-3 Krill Oil 500 MG CAPS Take 1,000 mg by mouth daily.   Yes [provider]  omeprazole (PRILOSEC) 40 MG capsule Take 40 mg by mouth  daily.   Yes [provider]  oxyCODONE (OXY IR/ROXICODONE) 5 MG immediate release tablet Take 2.5-5 mg by mouth every 6 (six) hours as needed for severe pain.   Yes [provider]  thiamine (VITAMIN B-1) 100 MG tablet Take 100 mg by mouth daily.   Yes [provider]     Vital Signs: BP (!) 92/56   Pulse 80   Temp 100.2 F (37.9 C)   Resp (!) 28   Ht  (1.753 m)   Wt 216 lb 11.4 oz (98.3 kg)   SpO2 100%   BMI 32.00 kg/m   Physical Exam Vitals and nursing note reviewed.  Constitutional:      General: He is not in acute distress.    Appearance: He is obese.     Comments: Agitated, wants to get out of bed, soft restraints/mittens on  HENT:     Head: Normocephalic.  Cardiovascular:     Rate and Rhythm: Normal rate.  Pulmonary:     Effort: Pulmonary effort is normal.     Comments: Intubated, not sedated Abdominal:     Palpations: Abdomen is soft.     Comments: (+) RUQ drain to gravity with scant purulent drainage (+) LUQ drains x 2 to gravity with scant purulent drainage, the more inferior drain's output is slightly brown tinged All insertion sites clean, dry, dressed appropriately  Skin:    General: Skin is warm and dry.  Neurological:  Mental Status: He is alert.    Imaging: CT CHEST W CONTRAST  Result Date: 11/13/2020 CLINICAL DATA:  Pneumonia. Evaluate for abdominal infection. History of necrotizing pancreatitis. EXAM: CT CHEST, ABDOMEN, AND PELVIS WITH CONTRAST TECHNIQUE: Multidetector CT imaging of the chest, abdomen and pelvis was performed following the standard protocol during bolus administration of intravenous contrast. CONTRAST:  OMNIPAQUE IOHEXOL 300 MG/ML  SOLN COMPARISON:  CT abdomen and pelvis 11/06/2020. FINDINGS: CT CHEST FINDINGS Cardiovascular: The heart is enlarged. There is no pericardial effusion. The aorta is normal in size. The main pulmonary artery is enlarged compatible with pulmonary artery hypertension.  Right-sided central venous catheter tip ends at the caval atrial junction. Mediastinum/Nodes: Visualized thyroid gland is within normal limits. There are no enlarged mediastinal or hilar lymph nodes. Enteric tube is seen throughout nondilated esophagus. Lungs/Pleura: There are large bilateral pleural effusions, left greater than right. There is compressive atelectasis of the bilateral lower lobes. There is also some atelectasis of the bilateral upper lobes. The lungs are otherwise clear. Trachea and central airways appear patent. There is no pneumothorax. Musculoskeletal: No chest wall mass or suspicious bone lesions identified. CT ABDOMEN PELVIS FINDINGS Hepatobiliary: No focal liver lesions are identified. There is gallbladder wall edema, unchanged. The gallbladder is nondilated. There is no biliary ductal dilatation. Pancreas: Head and neck of the pancreas are within normal limits. 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 Extensive peripancreatic air-fluid collection is again noted extending into the mid abdomen tracking along the body and tail of the pancreas. Right-sided percutaneous drainage catheters have been removed in the interval. There is a new left-sided pigtail percutaneous catheter within the collection. Compared to the prior study this collection has not significantly changed in size. The largest areas near the pancreatic tail measuring 4.7 x 12.0 cm (previously 12.7 x 4.8 cm). Spleen: Normal in size without focal abnormality. Adrenals/Urinary Tract: Adrenal glands are unremarkable. Kidneys are normal, without renal calculi, focal lesion, or hydronephrosis. Bladder is unremarkable. Stomach/Bowel: There is no evidence for bowel obstruction. The appendix is not seen. There is sigmoid colon diverticulosis without evidence for diverticulitis. There is new wall thickening and inflammation involving the splenic flexure of the colon. This portion of the colon abuts the.  Pancreatic air-fluid collection. If fistula would be difficult to exclude. There is also diffuse wall thickening of small bowel loops with associated mesenteric edema similar to the prior study. Gastrojejunostomy tube is present. Stomach is decompressed. Rectal catheter is present. Vascular/Lymphatic: Aorta and IVC are normal in size. There are atherosclerotic calcifications of the aorta. Reproductive: Prostate is unremarkable. Other: Perihepatic air-fluid collection extending into the right paracolic gutter is unchanged in size and appearance. There is a new percutaneous pigtail catheter in the collection adjacent to the liver. Small amount of ascites is decreased, but there is stable diffuse omental edema. Air-fluid collection abutting the proximal stomach and left adrenal gland appears unchanged measuring 8.3 x 3.0 cm image 3/60. Bilateral lower retroperitoneal air-fluid collections abutting the iliopsoas muscles also appear unchanged. The largest is on the left measuring 4.9 x 5.3 cm image 3/99. Presacral edema is present. No new enhancing fluid collections are identified. Musculoskeletal: There is diffuse body wall edema similar to the prior study. No acute fractures. No focal osseous lesion. IMPRESSION: 1. Large bilateral pleural effusions. 2. Atelectasis of the bilateral lower lobes and upper lobes. 3. Cardiomegaly. Enlarged main pulmonary artery compatible with pulmonary artery hypertension. 4. Overall, air-fluid collections throughout the abdomen and  pelvis have not significantly changed in size or distribution. Right-sided percutaneous catheters have been removed in the interval. There is a new left-sided pigtail catheter ending in the peripancreatic collection and a new right-sided pigtail catheter ending in perihepatic collection. 5. New wall thickening of the splenic flexure of the colon worrisome for reactive colitis. Note is made that this section of the colon abuts air-fluid collection near the  pancreas in fistula would be difficult to exclude. 6. Diffuse wall thickening of small bowel with mesenteric edema worrisome for nonspecific enteritis. Gastrojejunostomy tube in place. 7. Stable gallbladder wall edema, possibly reactive. Correlate for acute cholecystitis. Electronically Signed   By: Darliss Cheney M.D.   On: 11/13/2020 21:58   CT ABDOMEN PELVIS W CONTRAST  Result Date: 11/13/2020 CLINICAL DATA:  Pneumonia. Evaluate for abdominal infection. History of necrotizing pancreatitis. EXAM: CT CHEST, ABDOMEN, AND PELVIS WITH CONTRAST TECHNIQUE: Multidetector CT imaging of the chest, abdomen and pelvis was performed following the standard protocol during bolus administration of intravenous contrast. CONTRAST:  OMNIPAQUE IOHEXOL 300 MG/ML  SOLN COMPARISON:  CT abdomen and pelvis 11/06/2020. FINDINGS: CT CHEST FINDINGS Cardiovascular: The heart is enlarged. There is no pericardial effusion. The aorta is normal in size. The main pulmonary artery is enlarged compatible with pulmonary artery hypertension. Right-sided central venous catheter tip ends at the caval atrial junction. Mediastinum/Nodes: Visualized thyroid gland is within normal limits. There are no enlarged mediastinal or hilar lymph nodes. Enteric tube is seen throughout nondilated esophagus. Lungs/Pleura: There are large bilateral pleural effusions, left greater than right. There is compressive atelectasis of the bilateral lower lobes. There is also some atelectasis of the bilateral upper lobes. The lungs are otherwise clear. Trachea and central airways appear patent. There is no pneumothorax. Musculoskeletal: No chest wall mass or suspicious bone lesions identified. CT ABDOMEN PELVIS FINDINGS Hepatobiliary: No focal liver lesions are identified. There is gallbladder wall edema, unchanged. The gallbladder is nondilated. There is no biliary ductal dilatation. Pancreas: Head and neck of the pancreas are within normal limits. Changes of  pancreatic necrosis are again identified in the body and tail. Extensive peripancreatic air-fluid collection is again noted extending into the mid abdomen tracking along the body and tail of the pancreas. Right-sided percutaneous drainage catheters have been removed in the interval. There is a new left-sided pigtail percutaneous catheter within the collection. Compared to the prior study this collection has not significantly changed in size. The largest areas near the pancreatic tail measuring 4.7 x 12.0 cm (previously 12.7 x 4.8 cm). Spleen: Normal in size without focal abnormality. Adrenals/Urinary Tract: Adrenal glands are unremarkable. Kidneys are normal, without renal calculi, focal lesion, or hydronephrosis. Bladder is unremarkable. Stomach/Bowel: There is no evidence for bowel obstruction. The appendix is not seen. There is sigmoid colon diverticulosis without evidence for diverticulitis. There is new wall thickening and inflammation involving the splenic flexure of the colon. This portion of the colon abuts the. Pancreatic air-fluid collection. If fistula would be difficult to exclude. There is also diffuse wall thickening of small bowel loops with associated mesenteric edema similar to the prior study. Gastrojejunostomy tube is present. Stomach is decompressed. Rectal catheter is present. Vascular/Lymphatic: Aorta and IVC are normal in size. There are atherosclerotic calcifications of the aorta. Reproductive: Prostate is unremarkable. Other: Perihepatic air-fluid collection extending into the right paracolic gutter is unchanged in size and appearance. There is a new percutaneous pigtail catheter in the collection adjacent to the liver. Small amount of ascites is decreased, but there  is stable diffuse omental edema. Air-fluid collection abutting the proximal stomach and left adrenal gland appears unchanged measuring 8.3 x 3.0 cm image 3/60. Bilateral lower retroperitoneal air-fluid collections abutting the  iliopsoas muscles also appear unchanged. The largest is on the left measuring 4.9 x 5.3 cm image 3/99. Presacral edema is present. No new enhancing fluid collections are identified. Musculoskeletal: There is diffuse body wall edema similar to the prior study. No acute fractures. No focal osseous lesion. IMPRESSION: 1. Large bilateral pleural effusions. 2. Atelectasis of the bilateral lower lobes and upper lobes. 3. Cardiomegaly. Enlarged main pulmonary artery compatible with pulmonary artery hypertension. 4. Overall, air-fluid collections throughout the abdomen and pelvis have not significantly changed in size or distribution. Right-sided percutaneous catheters have been removed in the interval. There is a new left-sided pigtail catheter ending in the peripancreatic collection and a new right-sided pigtail catheter ending in perihepatic collection. 5. New wall thickening of the splenic flexure of the colon worrisome for reactive colitis. Note is made that this section of the colon abuts air-fluid collection near the pancreas in fistula would be difficult to exclude. 6. Diffuse wall thickening of small bowel with mesenteric edema worrisome for nonspecific enteritis. Gastrojejunostomy tube in place. 7. Stable gallbladder wall edema, possibly reactive. Correlate for acute cholecystitis. Electronically Signed   By: Darliss Cheney M.D.   On: 11/13/2020 21:58   DG CHEST PORT 1 VIEW  Result Date: 11/14/2020 CLINICAL DATA:  Shortness of breath, recent cardiac arrest EXAM: PORTABLE CHEST 1 VIEW COMPARISON:  Film from the previous day. FINDINGS: Cardiac shadow is prominent but stable. Feeding catheter extends into the stomach. Endotracheal tube is noted in satisfactory position. Right-sided PICC line is noted in satisfactory position. Known perihepatic drainage catheter is noted. Increasing right-sided pleural effusion is noted. Underlying atelectatic changes are likely present. Changes are similar to that seen on recent  chest CT. Known left-sided effusion is not as well appreciated. No acute bony abnormality is noted. IMPRESSION: Increasing right-sided effusion with basilar atelectasis. The left-sided effusion is not as well appreciated on today's exam. Electronically Signed   By: Alcide Clever M.D.   On: 11/14/2020 02:15   DG Chest Port 1 View  Result Date: 11/13/2020 CLINICAL DATA:  Hypoxemia, dyspnea. EXAM: PORTABLE CHEST 1 VIEW COMPARISON:  11/06/2020 FINDINGS: Feeding tube is in place, tip beyond the gastroesophageal junction. Heart size is accentuated by technique. There are bilateral pleural effusions and bibasilar opacities which partially obscure the hemidiaphragms bilaterally. Increased airspace filling opacities. RIGHT-sided PICC line has been removed. RIGHT pigtail type catheter overlies the RIGHT lung base. IMPRESSION: Increased bilateral effusions and bibasilar opacities. Electronically Signed   By: Norva Pavlov M.D.   On: 11/13/2020 15:32   [place] EKG SITE RITE  Result Date: 11/13/2020 If Site Rite image not attached, placement could not be confirmed due to current cardiac rhythm.  US Abdomen Limited RUQ (LIVER/GB)  Result Date: 11/14/2020 CLINICAL DATA:  Abdominal distension. Recent exploratory laparotomy with pancreatic debridement. EXAM: ULTRASOUND ABDOMEN LIMITED RIGHT UPPER QUADRANT COMPARISON:  CT scan November 06, 2020 FINDINGS: Gallbladder: Poorly evaluated due to open incision and bandages. The gallbladder wall does appear thickened measuring up to 6.4 mm. Common bile duct: Diameter: 3.6 mm Liver: Diffuse increased echogenicity. Mildly nodular contour. No focal mass. Portal vein is patent on color Doppler imaging with normal direction of blood flow towards the liver. Other: A right pleural effusion is identified. Ascites is identified as well. IMPRESSION: 1. Limited study due to open wound  and bandages. 2. The gallbladder in particular is poorly evaluated. There does appear to be some gallbladder  wall thickening measuring up to 6.4 mm. 3. No common bile duct dilatation. 4. Nodular contour to the liver raising the possibility of cirrhosis. 5. Right pleural effusion.  Ascites. Electronically Signed   By: Gerome Sam III M.D.   On: 11/14/2020 06:38    Labs:  CBC: Recent Labs    11/12/20 1142 11/13/20 0054 11/14/20 0240 11/14/20 0449 11/14/20 0650 11/15/20 1019  WBC 17.3* 14.9*  --   --  22.6* 16.4*  HGB 8.9* 8.6* 9.5* 10.2* 8.7* 8.0*  HCT 28.8* 28.3* 28.0* 30.0* 29.4* 26.9*  PLT 389 382  --   --  402* 263    COAGS: Recent Labs    10/22/2020 2102 10/22/20 0357 10/22/20 0826  INR 2.1* 2.1* 2.0*    BMP: Recent Labs    11/13/20 0054 11/14/20 0240 11/14/20 0449 11/14/20 0508 11/14/20 0510 11/15/20 0455  NA 137   < > 143 141 139 142  K 3.5   < > 4.0 5.2* 4.2 3.7  CL 102  --   --  107 105 106  CO2 28  --   --  22 23 29   GLUCOSE 224*  --   --  154* 148* 148*  BUN 10  --   --  12 11 15   CALCIUM 7.9*  --   --  8.4* 8.2* 8.3*  CREATININE 0.57*  --   --  0.43* 0.53* 0.58*  GFRNONAA >60  --   --  >60 >60 >60   < > = values in this interval not displayed.    LIVER FUNCTION TESTS: Recent Labs    11/11/20 0415 11/13/20 0054 11/14/20 0508 11/14/20 0510 11/15/20 0455  BILITOT 0.8 0.7  --  0.7 0.6  AST 26 22  --  23 19  ALT 14 15  --  14 15  ALKPHOS 91 90  --  86 73  PROT 5.6* 5.8*  --  6.1* 6.0*  ALBUMIN 1.6* 1.6* 1.6* 1.7* 1.7*    Assessment and Plan:  55 y/o M admitted with necrotizing pancreatitis s/p ex lap and placement of 2 peripancreatic surgical drains 10/22/20 which were subsequently removed by patient on 10/1 and s/p intra-abdominal abscess drain placement x 3 in IR on 9/26 seen today for drain follow up.   Patient agitated, intubated but not sedated, wants to get up, does not answer questions about pain.  Drain Location: Drain 1 - LUQ -medial  Size: Fr size: 14 Fr Date of placement: 9.26.22  Currently to: Drain collection device: gravity Output  is purulent   Drain Location: Drain 2 - RUQ - perihepatic Size: Fr size: 14 Fr Date of placement: 9.26.22  Currently to: Drain collection device: gravity Output is purulent   Drain Location: Drain 3-  RLQ Size: Fr size: 14 Fr Date of placement: 9.26.22  Currently to: Drain collection device: gravity Output is tan and purulent    24 hour output:  Output by Drain (mL) 11/13/20 0701 - 11/13/20 1900 11/13/20 1901 - 11/14/20 0700 11/14/20 0701 - 11/14/20 1900 11/14/20 1901 - 11/15/20 0700 11/15/20 0701 - 11/15/20 1412  Closed System Drain 1 Left;Medial;Lateral LUQ Other (Comment) 14 Fr. 20  10 0   Closed System Drain 2 Lateral RUQ Other (Comment) 14 Fr. 170 100 60 50 150  Closed System Drain 3 Right;Lateral RLQ Other (Comment) 14 Fr. 10  5 10  Interval imaging/drain manipulation:  None since IR drain placement - surgical drains removed by patient 10/2  Current examination: All drains flush/aspirates easily.  Insertion sites are unremarkable. Suture and stat lock in place x 3. All dressed appropriately.   Plan: Continue TID flushes of each drain with 5 cc NS. Record output Q shift. Dressing changes QD or PRN if soiled.  Call IR APP or on call IR MD if difficulty flushing or sudden change in drain output.  Repeat imaging/possible drain injection once output < 10 mL/QD (excluding flush material.)  Discharge planning: Please contact IR APP or on call IR MD prior to patient d/c to ensure appropriate follow up plans are in place. Typically patient will follow up with IR clinic 10-14 days post d/c for repeat imaging/possible drain injection. IR scheduler will contact patient with date/time of appointment. Patient will need to flush drain QD with 5 cc NS, record output QD, dressing changes every 2-3 days or earlier if soiled.   Per [facility] note from today monitor current IR drains and possible repeat CT later this week, no current plans to replace surgical drains at this time.  IR will  continue to follow - please call with questions or concerns.  Electronically Signed: Villa Herb, PA-C 11/15/2020, 1:57 PM   I spent a total of 15 Minutes at the the patient's bedside AND on the patient's hospital floor or unit, greater than 50% of which was counseling/coordinating care for intra-abdominal drains x 3.

## 2020-11-15 NOTE — Progress Notes (Signed)
Progress Note  25 Days Post-Op  Subjective: Patient agitated on the vent. Post-pyloric TF resumed yesterday and he seems to be tolerating. All 3 IR drains now purulent. Events of weekend noted. No family at bedside yet this AM.   Objective: Vital signs in last 24 hours: Temp:  [97.2 F (36.2 C)-101.8 F (38.8 C)] 99.1 F (37.3 C) (10/03 0830) Pulse Rate:  [64-108] 73 (10/03 0830) Resp:  [20-38] 30 (10/03 0830) BP: (84-157)/(55-93) 103/65 (10/03 0830) SpO2:  [93 %-100 %] 96 % (10/03 0830) FiO2 (%):  [60 %-100 %] 60 % (10/03 0800) Last BM Date: 11/15/20  Intake/Output from previous day: 10/02 0701 - 10/03 0700 In: 2242.2 [I.V.:846.9; NG/GT:810; IV Piggyback:570.3] Out: 2585 [Urine:1350; Drains:135; Stool:1100] Intake/Output this shift: Total I/O In: 145 [I.V.:70; NG/GT:50; IV Piggyback:24.9] Out: 125 [Urine:125]  PE: Gen:  Agitated on the vent Card:  RRR Pulm:  CTAB, mechanically ventilated Abd: soft, IR drain x2 on the right and IR drain x1 on the left draining purulent fluid, cdi dressing over previous drain sites on the right, midline wound clean with granulation tissue present    Lab Results:  Recent Labs    11/13/20 0054 11/14/20 0240 11/14/20 0449 11/14/20 0650  WBC 14.9*  --   --  22.6*  HGB 8.6*   < > 10.2* 8.7*  HCT 28.3*   < > 30.0* 29.4*  PLT 382  --   --  402*   < > = values in this interval not displayed.   BMET Recent Labs    11/14/20 0510 11/15/20 0455  NA 139 142  K 4.2 3.7  CL 105 106  CO2 23 29  GLUCOSE 148* 148*  BUN 11 15  CREATININE 0.53* 0.58*  CALCIUM 8.2* 8.3*   PT/INR No results for input(s): LABPROT, INR in the last 72 hours. CMP     Component Value Date/Time   NA 142 11/15/2020 0455   K 3.7 11/15/2020 0455   CL 106 11/15/2020 0455   CO2 29 11/15/2020 0455   GLUCOSE 148 (H) 11/15/2020 0455   BUN 15 11/15/2020 0455   CREATININE 0.58 (L) 11/15/2020 0455   CALCIUM 8.3 (L) 11/15/2020 0455   PROT 6.0 (L) 11/15/2020 0455    ALBUMIN 1.7 (L) 11/15/2020 0455   AST 19 11/15/2020 0455   ALT 15 11/15/2020 0455   ALKPHOS 73 11/15/2020 0455   BILITOT 0.6 11/15/2020 0455   GFRNONAA >60 11/15/2020 0455   Lipase     Component Value Date/Time   LIPASE 29 11/13/2020 0054       Studies/Results: CT CHEST W CONTRAST  Result Date: 11/13/2020 CLINICAL DATA:  Pneumonia. Evaluate for abdominal infection. History of necrotizing pancreatitis. EXAM: CT CHEST, ABDOMEN, AND PELVIS WITH CONTRAST TECHNIQUE: Multidetector CT imaging of the chest, abdomen and pelvis was performed following the standard protocol during bolus administration of intravenous contrast. CONTRAST:  OMNIPAQUE IOHEXOL 300 MG/ML  SOLN COMPARISON:  CT abdomen and pelvis 11/06/2020. FINDINGS: CT CHEST FINDINGS Cardiovascular: The heart is enlarged. There is no pericardial effusion. The aorta is normal in size. The main pulmonary artery is enlarged compatible with pulmonary artery hypertension. Right-sided central venous catheter tip ends at the caval atrial junction. Mediastinum/Nodes: Visualized thyroid gland is within normal limits. There are no enlarged mediastinal or hilar lymph nodes. Enteric tube is seen throughout nondilated esophagus. Lungs/Pleura: There are large bilateral pleural effusions, left greater than right. There is compressive atelectasis of the bilateral lower lobes. There is also some  atelectasis of the bilateral upper lobes. The lungs are otherwise clear. Trachea and central airways appear patent. There is no pneumothorax. Musculoskeletal: No chest wall mass or suspicious bone lesions identified. CT ABDOMEN PELVIS FINDINGS Hepatobiliary: No focal liver lesions are identified. There is gallbladder wall edema, unchanged. The gallbladder is nondilated. There is no biliary ductal dilatation. Pancreas: Head and neck of the pancreas are within normal limits. 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 Extensive  peripancreatic air-fluid collection is again noted extending into the mid abdomen tracking along the body and tail of the pancreas. Right-sided percutaneous drainage catheters have been removed in the interval. There is a new left-sided pigtail percutaneous catheter within the collection. Compared to the prior study this collection has not significantly changed in size. The largest areas near the pancreatic tail measuring 4.7 x 12.0 cm (previously 12.7 x 4.8 cm). Spleen: Normal in size without focal abnormality. Adrenals/Urinary Tract: Adrenal glands are unremarkable. Kidneys are normal, without renal calculi, focal lesion, or hydronephrosis. Bladder is unremarkable. Stomach/Bowel: There is no evidence for bowel obstruction. The appendix is not seen. There is sigmoid colon diverticulosis without evidence for diverticulitis. There is new wall thickening and inflammation involving the splenic flexure of the colon. This portion of the colon abuts the. Pancreatic air-fluid collection. If fistula would be difficult to exclude. There is also diffuse wall thickening of small bowel loops with associated mesenteric edema similar to the prior study. Gastrojejunostomy tube is present. Stomach is decompressed. Rectal catheter is present. Vascular/Lymphatic: Aorta and IVC are normal in size. There are atherosclerotic calcifications of the aorta. Reproductive: Prostate is unremarkable. Other: Perihepatic air-fluid collection extending into the right paracolic gutter is unchanged in size and appearance. There is a new percutaneous pigtail catheter in the collection adjacent to the liver. Small amount of ascites is decreased, but there is stable diffuse omental edema. Air-fluid collection abutting the proximal stomach and left adrenal gland appears unchanged measuring 8.3 x 3.0 cm image 3/60. Bilateral lower retroperitoneal air-fluid collections abutting the iliopsoas muscles also appear unchanged. The largest is on the left  measuring 4.9 x 5.3 cm image 3/99. Presacral edema is present. No new enhancing fluid collections are identified. Musculoskeletal: There is diffuse body wall edema similar to the prior study. No acute fractures. No focal osseous lesion. IMPRESSION: 1. Large bilateral pleural effusions. 2. Atelectasis of the bilateral lower lobes and upper lobes. 3. Cardiomegaly. Enlarged main pulmonary artery compatible with pulmonary artery hypertension. 4. Overall, air-fluid collections throughout the abdomen and pelvis have not significantly changed in size or distribution. Right-sided percutaneous catheters have been removed in the interval. There is a new left-sided pigtail catheter ending in the peripancreatic collection and a new right-sided pigtail catheter ending in perihepatic collection. 5. New wall thickening of the splenic flexure of the colon worrisome for reactive colitis. Note is made that this section of the colon abuts air-fluid collection near the pancreas in fistula would be difficult to exclude. 6. Diffuse wall thickening of small bowel with mesenteric edema worrisome for nonspecific enteritis. Gastrojejunostomy tube in place. 7. Stable gallbladder wall edema, possibly reactive. Correlate for acute cholecystitis. Electronically Signed   By: Darliss Cheney M.D.   On: 11/13/2020 21:58   CT ABDOMEN PELVIS W CONTRAST  Result Date: 11/13/2020 CLINICAL DATA:  Pneumonia. Evaluate for abdominal infection. History of necrotizing pancreatitis. EXAM: CT CHEST, ABDOMEN, AND PELVIS WITH CONTRAST TECHNIQUE: Multidetector CT imaging of the chest, abdomen and pelvis was performed following the  standard protocol during bolus administration of intravenous contrast. CONTRAST:  OMNIPAQUE IOHEXOL 300 MG/ML  SOLN COMPARISON:  CT abdomen and pelvis 11/06/2020. FINDINGS: CT CHEST FINDINGS Cardiovascular: The heart is enlarged. There is no pericardial effusion. The aorta is normal in size. The main pulmonary artery is enlarged  compatible with pulmonary artery hypertension. Right-sided central venous catheter tip ends at the caval atrial junction. Mediastinum/Nodes: Visualized thyroid gland is within normal limits. There are no enlarged mediastinal or hilar lymph nodes. Enteric tube is seen throughout nondilated esophagus. Lungs/Pleura: There are large bilateral pleural effusions, left greater than right. There is compressive atelectasis of the bilateral lower lobes. There is also some atelectasis of the bilateral upper lobes. The lungs are otherwise clear. Trachea and central airways appear patent. There is no pneumothorax. Musculoskeletal: No chest wall mass or suspicious bone lesions identified. CT ABDOMEN PELVIS FINDINGS Hepatobiliary: No focal liver lesions are identified. There is gallbladder wall edema, unchanged. The gallbladder is nondilated. There is no biliary ductal dilatation. Pancreas: Head and neck of the pancreas are within normal limits. 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 Extensive peripancreatic air-fluid collection is again noted extending into the mid abdomen tracking along the body and tail of the pancreas. Right-sided percutaneous drainage catheters have been removed in the interval. There is a new left-sided pigtail percutaneous catheter within the collection. Compared to the prior study this collection has not significantly changed in size. The largest areas near the pancreatic tail measuring 4.7 x 12.0 cm (previously 12.7 x 4.8 cm). Spleen: Normal in size without focal abnormality. Adrenals/Urinary Tract: Adrenal glands are unremarkable. Kidneys are normal, without renal calculi, focal lesion, or hydronephrosis. Bladder is unremarkable. Stomach/Bowel: There is no evidence for bowel obstruction. The appendix is not seen. There is sigmoid colon diverticulosis without evidence for diverticulitis. There is new wall thickening and inflammation involving the splenic flexure of the  colon. This portion of the colon abuts the. Pancreatic air-fluid collection. If fistula would be difficult to exclude. There is also diffuse wall thickening of small bowel loops with associated mesenteric edema similar to the prior study. Gastrojejunostomy tube is present. Stomach is decompressed. Rectal catheter is present. Vascular/Lymphatic: Aorta and IVC are normal in size. There are atherosclerotic calcifications of the aorta. Reproductive: Prostate is unremarkable. Other: Perihepatic air-fluid collection extending into the right paracolic gutter is unchanged in size and appearance. There is a new percutaneous pigtail catheter in the collection adjacent to the liver. Small amount of ascites is decreased, but there is stable diffuse omental edema. Air-fluid collection abutting the proximal stomach and left adrenal gland appears unchanged measuring 8.3 x 3.0 cm image 3/60. Bilateral lower retroperitoneal air-fluid collections abutting the iliopsoas muscles also appear unchanged. The largest is on the left measuring 4.9 x 5.3 cm image 3/99. Presacral edema is present. No new enhancing fluid collections are identified. Musculoskeletal: There is diffuse body wall edema similar to the prior study. No acute fractures. No focal osseous lesion. IMPRESSION: 1. Large bilateral pleural effusions. 2. Atelectasis of the bilateral lower lobes and upper lobes. 3. Cardiomegaly. Enlarged main pulmonary artery compatible with pulmonary artery hypertension. 4. Overall, air-fluid collections throughout the abdomen and pelvis have not significantly changed in size or distribution. Right-sided percutaneous catheters have been removed in the interval. There is a new left-sided pigtail catheter ending in the peripancreatic collection and a new right-sided pigtail catheter ending in perihepatic collection. 5. New wall thickening of the splenic flexure of the colon  worrisome for reactive colitis. Note is made that this section of the  colon abuts air-fluid collection near the pancreas in fistula would be difficult to exclude. 6. Diffuse wall thickening of small bowel with mesenteric edema worrisome for nonspecific enteritis. Gastrojejunostomy tube in place. 7. Stable gallbladder wall edema, possibly reactive. Correlate for acute cholecystitis. Electronically Signed   By: Darliss Cheney M.D.   On: 11/13/2020 21:58   DG CHEST PORT 1 VIEW  Result Date: 11/14/2020 CLINICAL DATA:  Shortness of breath, recent cardiac arrest EXAM: PORTABLE CHEST 1 VIEW COMPARISON:  Film from the previous day. FINDINGS: Cardiac shadow is prominent but stable. Feeding catheter extends into the stomach. Endotracheal tube is noted in satisfactory position. Right-sided PICC line is noted in satisfactory position. Known perihepatic drainage catheter is noted. Increasing right-sided pleural effusion is noted. Underlying atelectatic changes are likely present. Changes are similar to that seen on recent chest CT. Known left-sided effusion is not as well appreciated. No acute bony abnormality is noted. IMPRESSION: Increasing right-sided effusion with basilar atelectasis. The left-sided effusion is not as well appreciated on today's exam. Electronically Signed   By: Alcide Clever M.D.   On: 11/14/2020 02:15   DG Chest Port 1 View  Result Date: 11/13/2020 CLINICAL DATA:  Hypoxemia, dyspnea. EXAM: PORTABLE CHEST 1 VIEW COMPARISON:  11/06/2020 FINDINGS: Feeding tube is in place, tip beyond the gastroesophageal junction. Heart size is accentuated by technique. There are bilateral pleural effusions and bibasilar opacities which partially obscure the hemidiaphragms bilaterally. Increased airspace filling opacities. RIGHT-sided PICC line has been removed. RIGHT pigtail type catheter overlies the RIGHT lung base. IMPRESSION: Increased bilateral effusions and bibasilar opacities. Electronically Signed   By: Norva Pavlov M.D.   On: 11/13/2020 15:32   [place] EKG SITE RITE  Result  Date: 11/13/2020 If Site Rite image not attached, placement could not be confirmed due to current cardiac rhythm.  US Abdomen Limited RUQ (LIVER/GB)  Result Date: 11/14/2020 CLINICAL DATA:  Abdominal distension. Recent exploratory laparotomy with pancreatic debridement. EXAM: ULTRASOUND ABDOMEN LIMITED RIGHT UPPER QUADRANT COMPARISON:  CT scan November 06, 2020 FINDINGS: Gallbladder: Poorly evaluated due to open incision and bandages. The gallbladder wall does appear thickened measuring up to 6.4 mm. Common bile duct: Diameter: 3.6 mm Liver: Diffuse increased echogenicity. Mildly nodular contour. No focal mass. Portal vein is patent on color Doppler imaging with normal direction of blood flow towards the liver. Other: A right pleural effusion is identified. Ascites is identified as well. IMPRESSION: 1. Limited study due to open wound and bandages. 2. The gallbladder in particular is poorly evaluated. There does appear to be some gallbladder wall thickening measuring up to 6.4 mm. 3. No common bile duct dilatation. 4. Nodular contour to the liver raising the possibility of cirrhosis. 5. Right pleural effusion.  Ascites. Electronically Signed   By: Gerome Sam III M.D.   On: 11/14/2020 06:38    Anti-infectives: Anti-infectives (From admission, onward)    Start     Dose/Rate Route Frequency Ordered Stop   11/14/20 1200  vancomycin (VANCOREADY) IVPB 750 mg/150 mL        750 mg 150 mL/hr over 60 Minutes Intravenous Every 8 hours 11/14/20 0251     11/14/20 0330  vancomycin (VANCOREADY) IVPB 2000 mg/400 mL        2,000 mg 200 mL/hr over 120 Minutes Intravenous  Once 11/14/20 0251 11/14/20 0519   11/12/20 1900  piperacillin-tazobactam (ZOSYN) IVPB 3.375 g  3.375 g 12.5 mL/hr over 240 Minutes Intravenous Every 8 hours 11/12/20 1538     11/09/20 1730  Ampicillin-Sulbactam (UNASYN) 3 g in sodium chloride 0.9 % 100 mL IVPB  Status:  Discontinued        3 g 200 mL/hr over 30 Minutes Intravenous  Every 6 hours 11/09/20 1617 11/12/20 1538   11/06/20 1545  piperacillin-tazobactam (ZOSYN) IVPB 3.375 g  Status:  Discontinued        3.375 g 12.5 mL/hr over 240 Minutes Intravenous Every 8 hours 11/06/20 1453 11/09/20 1616   11/01/20 0900  levofloxacin (LEVAQUIN) IVPB 750 mg  Status:  Discontinued        750 mg 100 mL/hr over 90 Minutes Intravenous Every 24 hours 11/01/20 0824 11/06/20 1408   11/01/20 0900  metroNIDAZOLE (FLAGYL) IVPB 500 mg  Status:  Discontinued        500 mg 100 mL/hr over 60 Minutes Intravenous Every 12 hours 11/01/20 0824 11/06/20 1408   10/29/20 2200  meropenem (MERREM) 1 g in sodium chloride 0.9 % 100 mL IVPB  Status:  Discontinued        1 g 200 mL/hr over 30 Minutes Intravenous Every 8 hours 10/29/20 1428 11/01/20 0824   10/28/20 1615  meropenem (MERREM) 2 g in sodium chloride 0.9 % 100 mL IVPB  Status:  Discontinued        2 g 200 mL/hr over 30 Minutes Intravenous Every 8 hours 10/28/20 1515 10/29/20 1428   10/28/20 1445  metroNIDAZOLE (FLAGYL) IVPB 500 mg  Status:  Discontinued        500 mg 100 mL/hr over 60 Minutes Intravenous Every 12 hours 10/28/20 1348 10/28/20 1505   10/25/20 2300  fluconazole (DIFLUCAN) IVPB 400 mg        400 mg 100 mL/hr over 120 Minutes Intravenous Every 24 hours 10/25/20 0742 10/29/20 0718   10/24/20 2300  fluconazole (DIFLUCAN) IVPB 200 mg  Status:  Discontinued        200 mg 100 mL/hr over 60 Minutes Intravenous Every 24 hours 10/24/20 0943 10/25/20 0742   10/22/20 1800  vancomycin (VANCOREADY) IVPB 1500 mg/300 mL  Status:  Discontinued        1,500 mg 150 mL/hr over 120 Minutes Intravenous Every 24 hours 11/12/2020 2225 10/22/20 0759   10/22/20 0900  linezolid (ZYVOX) IVPB 600 mg  Status:  Discontinued        600 mg 300 mL/hr over 60 Minutes Intravenous Every 12 hours 10/22/20 0800 10/27/20 0852   10/22/20 0300  piperacillin-tazobactam (ZOSYN) IVPB 3.375 g  Status:  Discontinued        3.375 g 12.5 mL/hr over 240 Minutes  Intravenous Every 8 hours 10/20/2020 2225 10/28/20 1514   10/28/2020 2335  metroNIDAZOLE (FLAGYL) IVPB 500 mg  Status:  Discontinued        500 mg 100 mL/hr over 60 Minutes Intravenous Every 12 hours 11/10/2020 2142 10/22/20 0747   11/07/2020 2248  fluconazole (DIFLUCAN) IVPB 400 mg  Status:  Discontinued        400 mg 100 mL/hr over 120 Minutes Intravenous Every 24 hours 10/26/2020 2142 10/24/20 0943   10/31/2020 1748  vancomycin (VANCOREADY) IVPB 2000 mg/400 mL        2,000 mg 200 mL/hr over 120 Minutes Intravenous  Once 10/29/2020 1706 11/06/2020 2113   11/03/2020 1715  piperacillin-tazobactam (ZOSYN) IVPB 3.375 g        3.375 g 100 mL/hr over 30 Minutes Intravenous  Once 10/30/2020 1706  11/05/2020 1903        Assessment/Plan POD 24 s/p ex lap with pancreatic debridement for infected necrotic pancreatitis by Dr. Sheliah Hatch on 9/9 - Superior drain under the mesocolon along with the head of the pancreas - Inferior drain under the mesocolon along the left upper abdomen and what is likely a position inferior to the pancreas - CT 9/24 w/ gas and fluid collections that are grossly stable in the retroperitoneum and are drained by JP tubes. Cont drains - CT 9/24 also with mildly increased amount of fluid is noted around the liver with an increased amount of air seen in the non dependent portion concerning for persistent bowel leak. - pt is having bowel function and IR drains placed 9/26 do not appear feculent. Low clinical suspicion for bowel leak - s/p IR drains 9/26. Cx with enterococcus faecalis, sensitivities pending  - BID WTD - Patient pulled out surgical drain x2 on 10/1, he still has IR drain x3 in place. These appear to be in good position and are all putting out purulent drainage - CT 10/1 air-fluid collections throughout the abdomen and pelvis have not significantly changed in size or distribution; New wall thickening of the splenic flexure of the colon worrisome for reactive colitis - Previous drain  sites leaking - change dressings PRN saturation. Will continue to monitor abdominal exam. May need IR assistance placing new drains at some point. No plans for surgical intervention at this time.    FEN: NPO, postpyloric TF ID: currently on zosyn and vancomycin VTE: SCDs, heparin subq Foley - placed 10/2   - Per primary -  VDRF - reintubated 10/2,  PEA arrest post-intubation  Septic shock, possible aspiration B/l pleural effusions Abl anemia  ETOH use  AKI T2DM  LOS: 25 days    Juliet Rude, [facility] Surgery 11/15/2020, 9:16 AM Please see Amion for pager number during day hours 7:00am-4:30pm

## 2020-11-15 NOTE — Progress Notes (Signed)
Physical Therapy Treatment Patient Details Name: Paul Chambers MRN: [id number] DOB: Oct 16, 1965 Today's Date: 11/15/2020   History of Present Illness Pt adm 9/8 with abdominal pain and AMS. Pt found to have extensive pneumoperitoneum and underwent ex lap on 9/9. Pt with infected necrotizing pancreatitis. Pt with acute hypoxic respiratory failure. Pt intubated 9/9-9/12. 10/2 ETT placed.  On 60% FiO2 12 PEEP  PMH - etoh, HTN, gout, pancreatitis.    PT Comments    Limited to P/AAROM due to pt started escalating in agitation once his UE's were unrestrained and started asking pt to sit EOB.  Had to abort and re restrain without completing our plan to transition to EOB.    Recommendations for follow up therapy are one component of a multi-disciplinary discharge planning process, led by the attending physician.  Recommendations may be updated based on patient status, additional functional criteria and insurance authorization.  Follow Up Recommendations  LTACH;Supervision/Assistance - 24 hour;Other (comment) (Will continue to try for CIR, but LTACH may be more appropriate at this time.)     Equipment Recommendations  Rolling walker with 5" wheels;Other (comment)    Recommendations for Other Services       Precautions / Restrictions Precautions Precautions: Fall;Other (comment) (restraints to bil UE's 10/3) Precaution Comments: Several abdominal drains (JPD R x2 and x2 gravity drains on R/1 on LLQ, perihepatic drains)     Mobility  Bed Mobility               General bed mobility comments: Plan was to attempt sitting EOB, but pt became more agitated with each attempt to move to EOB    Transfers                    Ambulation/Gait                 Stairs             Wheelchair Mobility    Modified Rankin (Stroke Patients Only)       Balance                                            Cognition Arousal/Alertness:  Awake/alert;Lethargic Behavior During Therapy: Flat affect;Agitated;Restless Overall Cognitive Status: Difficult to assess                                        Exercises Other Exercises Other Exercises: Very minimal LE AAROM, before pt started to escalate. Other Exercises: UE's moved actively when not restrained.    General Comments General comments (skin integrity, edema, etc.): VSS onETT at  60% FiO2, PEEP 12, HR rose as expected with agitation.      Pertinent Vitals/Pain Pain Assessment: Faces Faces Pain Scale: Hurts little more Pain Location: Abdomen and R flank Pain Descriptors / Indicators: Discomfort Pain Intervention(s): Monitored during session    Home Living                      Prior Function            PT Goals (current goals can now be found in the care plan section) Acute Rehab PT Goals PT Goal Formulation: With patient Time For Goal Achievement: 11/26/20 Potential to Achieve Goals: Good Progress towards PT goals: Not  progressing toward goals - comment (pt's agitation limiting assisting with mobility)    Frequency    Min 3X/week      PT Plan Discharge plan needs to be updated    Co-evaluation              AM-PAC PT "6 Clicks" Mobility   Outcome Measure  Help needed turning from your back to your side while in a flat bed without using bedrails?: A Lot Help needed moving from lying on your back to sitting on the side of a flat bed without using bedrails?: Total Help needed moving to and from a bed to a chair (including a wheelchair)?: Total Help needed standing up from a chair using your arms (e.g., wheelchair or bedside chair)?: Total Help needed to walk in hospital room?: Total Help needed climbing 3-5 steps with a railing? : Total 6 Click Score: 7    End of Session Equipment Utilized During Treatment: Oxygen Activity Tolerance: Treatment limited secondary to agitation Patient left: in bed;with call bell/phone  within reach;with bed alarm set;with nursing/sitter in room Nurse Communication: Mobility status PT Visit Diagnosis: Other abnormalities of gait and mobility (R26.89)     Time: 0865-7846 PT Time Calculation (min) (ACUTE ONLY): 13 min  Charges:  $Therapeutic Activity: 8-22 mins                     11/15/2020  Jacinto Halim., PT Acute Rehabilitation Services [phone]  (pager) [phone]  (office)   Eliseo Gum Salem Lembke 11/15/2020, 3:18 PM

## 2020-11-15 NOTE — Progress Notes (Signed)
Nutrition Follow-up  DOCUMENTATION CODES:  Severe malnutrition in context of acute illness/injury  INTERVENTION:   Continue TF via postpyloric Cortrak tube: Vital 1.5 @ 65 ml/hr Prosource TF 45 ml TID  Provides 2460 kcal, 138 gm protein, 1192 ml free water daily.  NUTRITION DIAGNOSIS:  Severe Malnutrition related to acute illness (infected necrotic pancreatitis) as evidenced by moderate fat depletion, moderate muscle depletion, energy intake < or equal to 50% for > or equal to 5 days, percent weight loss (8.4% weight loss in 1.5 months). -- ongoing  GOAL:  Patient will meet greater than or equal to 90% of their needs -- met with TF   MONITOR:  Diet advancement, Labs, Weight trends, TF tolerance, Skin, I & O's, Other (Comment) (TPN)  REASON FOR ASSESSMENT:  Consult Enteral/tube feeding initiation and management  ASSESSMENT:  55 year old male who presented to the ED on 9/08 with AMS and abdominal pain. Pt seen at [place] 1 week PTA and was diagnosed with AKI and pancreatitis. PMH of HTN, gout, tobacco abuse, EtOH abuse. Pt admitted with sepsis secondary to infected necrotic pancreatitis.  9/09 - s/p ex-lap with pancreatic debridement for infected necrotic pancreatitis 9/10 - TPN initiated 9/12 - TPN increased to goal rate, extubated 9/14 - NGT clamping trials, NGT removed by pt 9/15 - SLP evaluation with recommendations for NPO except ice chips 9/16 - Cortrak placed for administration of contrast for CT (tip gastric) 9/19 - Cortrak tube advanced post-pyloric, trickle TF initiated 9/20 - emesis, TF held 9/21 - trickle TF restarted 9/23 - TPN at half rate, starting to advance TF to goal 9/24 - TPN d/c, s/p BSE- advanced to full liquid diet 9/25 - pt made NPO 9/26 - s/p CT guided placement of 14 Fr drainage catheters in the following areas: periphepatic abscess (yielding 182m blood tinged fluid), R lower abdominal abscess (yielding 510mpurulent appearing fluid), and L lower  abdominal abscess (yielding 12547murulent appearing fluid).  10/2 - patient developed respiratory distress (possible aspiration event) and required intubation, then PEA arrested requiring 2 minutes of CPR.  Discussed patient in ICU rounds and with RN today. Patient pulled out 2 surgical drains, 3 intra-abdominal drains remain.   Current TF regimen: Vital 1.5 at 65 ml/hr with 45 ml Prosource TF TID via postpyloric Cortrak tube providing 2460 kcal, 138 gm protein, 1192 ml free water daily.  Patient is currently intubated on ventilator support MV: 13 L/min Temp (24hrs), Avg:99.6 F (37.6 C), Min:97.2 F (36.2 C), Max:101.7 F (38.7 C)   Medications reviewed and include Colace, folic acid, Novolog, Semglee, lactulose, Protonix, Miralax, Thiamine, Precedex, IV abx.  Labs reviewed. CBGs: 145-68-100  Admission weight: 93.7 kg Current weight: 98.3 kg (9/29)  UOP: 1350 ml x 24 hours Drain output: 135 ml x 24 hours Stool output: 1,100 ml x 24 hours via rectal tube I/O: +6.4 L since admit  Diet Order:   Diet Order             Diet NPO time specified  Diet effective now                  EDUCATION NEEDS:  No education needs have been identified at this time  Skin:  Skin Assessment: Skin Integrity Issues: Skin Integrity Issues:: DTI DTI: coccyx Stage I: - Incisions: abdomen  Last BM:  10/3 rectal tube  Height:  Ht Readings from Last 1 Encounters:  11/14/20 _0  (1.753 m)   Weight:  Wt Readings from Last 1 Encounters:  11/11/20 98.3 kg   BMI:  Body mass index is 32 kg/m.  Estimated Nutritional Needs:  Kcal:  1552-0802 Protein:  125-145 grams Fluid:  >/= 2.0 L   Lucas Mallow, RD, LDN, CNSC Please refer to Amion for contact information.

## 2020-11-15 NOTE — Plan of Care (Signed)

## 2020-11-15 NOTE — Progress Notes (Signed)
Inpatient Diabetes Program Recommendations  AACE/ADA: New Consensus Statement on Inpatient Glycemic Control (2015)  Target Ranges:  Prepandial:   less than 140 mg/dL      Peak postprandial:   less than 180 mg/dL (1-2 hours)      Critically ill patients:  140 - 180 mg/dL   Lab Results  Component Value Date   GLUCAP 100 (H) 11/15/2020   HGBA1C 6.8 (H) 10/20/2020    Review of Glycemic Control Results for Paul Chambers, Paul Chambers (MRN [id number]) as of 11/15/2020 12:33  Ref. Range 11/14/2020 19:56 11/15/2020 00:59 11/15/2020 04:52 11/15/2020 07:17 11/15/2020 11:12  Glucose-Capillary Latest Ref Range: 70 - 99 mg/dL 414 (H) 239 (H) 532 (H) 68 (L) 100 (H)   Inpatient Diabetes Program Recommendations:   -Please Consider decrease in Novolog correction to 0-15 units q 4 hrs.  Thank you, [name]. [name], RN, MSN, CDE  Diabetes Coordinator Inpatient Glycemic Control Team Team Pager [phone] (8am-5pm) 11/15/2020 12:34 PM

## 2020-11-15 NOTE — Progress Notes (Signed)
[name]    Date of Admission:  11/05/2020     ID: Paul Chambers is a 55 y.o. male with necrotizing pancreatitis s/p debridement, intra-abd fluid collection s/p drain placement with enterococcus and clostridial species on piptazo, on early 10/2, pulled out 2 surgical drains, became hypoxic and PEA code, s/p intubation with pressor support. Started on vanco. Micro still pending Principal Problem:   Acute necrotizing pancreatitis Active Problems:   Toxic encephalopathy   Severe sepsis (HCC)   Hyponatremia   Hypoalbuminemia   Macrocytic anemia   Pressure injury of skin   Protein-calorie malnutrition, severe   Intra-abdominal abscess (HCC)   Acute respiratory failure with hypoxia (HCC)   Septic shock (HCC)   Cardiac arrest (HCC)    Subjective: Remains intubated. Was on cooling blanket overnight.   Medications:   chlorhexidine gluconate (MEDLINE KIT)  15 mL Mouth Rinse BID   Chlorhexidine Gluconate Cloth  6 each Topical Q0600   docusate  100 mg Per Tube BID   feeding supplement (PROSource TF)  45 mL Per Tube TID   fluticasone furoate-vilanterol  1 puff Inhalation Daily   And   umeclidinium bromide  1 puff Inhalation Daily   folic acid  1 mg Per Tube Daily   heparin  5,000 Units Subcutaneous Q8H   insulin aspart  0-20 Units Subcutaneous Q4H   insulin glargine-yfgn  20 Units Subcutaneous Daily   lactulose  20 g Per Tube TID   mouth rinse  15 mL Mouth Rinse 10 times per day   pantoprazole (PROTONIX) IV  40 mg Intravenous Q24H   polyethylene glycol  17 g Per Tube Daily   QUEtiapine  25 mg Per Tube QHS   sodium chloride flush  10-40 mL Intracatheter Q12H   sodium chloride flush  5 mL Intracatheter Q8H   sodium chloride flush  5 mL Intracatheter Q8H   sodium chloride flush  5 mL Intracatheter Q8H   thiamine  100 mg Per Tube Daily    Objective: Vital signs in last 24 hours: Temp:  [97.2 F (36.2 C)-101.7 F (38.7 C)] 100.2 F (37.9 C) (10/03  1400) Pulse Rate:  [64-95] 80 (10/03 1400) Resp:  [20-52] 32 (10/03 1400) BP: (84-157)/(55-77) 104/59 (10/03 1400) SpO2:  [93 %-100 %] 100 % (10/03 1400) FiO2 (%):  [60 %-80 %] 60 % (10/03 1400) Physical Exam  Constitutional: sedated, intubated,chronically ill appearing and well-nourished. No distress.  HENT: OETT in place, no scleral icterus Cardiovascular: Normal rate, regular rhythm and normal heart sounds. Exam reveals no gallop and no friction rub.  No murmur heard.  Pulmonary/Chest: Effort normal and breath sounds normal. No respiratory distress. He has no wheezes.  Abdominal: Soft. Bowel sounds are decreased. He exhibits distension. There is no tenderness. 3 drains only in place. Midline incision healing Ext: +1 edemiare Neurological: He is alert and oriented to person, place, and time.  Skin: Skin is warm and dry. No rash noted. No erythema.  Psychiatric: He has a normal mood and affect. His behavior is normal.    Lab Results Recent Labs    11/14/20 0510 11/14/20 0650 11/15/20 0455 11/15/20 1019  WBC  --  22.6*  --  16.4*  HGB  --  8.7*  --  8.0*  HCT  --  29.4*  --  26.9*  NA 139  --  142  --   K 4.2  --  3.7  --   CL 105  --  106  --  CO2 23  --  29  --   BUN 11  --  15  --   CREATININE 0.53*  --  0.58*  --    Liver Panel Recent Labs    11/14/20 0510 11/15/20 0455  PROT 6.1* 6.0*  ALBUMIN 1.7* 1.7*  AST 23 19  ALT 14 15  ALKPHOS 86 73  BILITOT 0.7 0.6   No results found for: ESRSEDRATE, POCTSEDRATE  Microbiology: reviewed Studies/Results: CT CHEST W CONTRAST  Result Date: 11/13/2020 CLINICAL DATA:  Pneumonia. Evaluate for abdominal infection. History of necrotizing pancreatitis. EXAM: CT CHEST, ABDOMEN, AND PELVIS WITH CONTRAST TECHNIQUE: Multidetector CT imaging of the chest, abdomen and pelvis was performed following the standard protocol during bolus administration of intravenous contrast. CONTRAST:  13m OMNIPAQUE IOHEXOL 300 MG/ML  SOLN  COMPARISON:  CT abdomen and pelvis 11/06/2020. FINDINGS: CT CHEST FINDINGS Cardiovascular: The heart is enlarged. There is no pericardial effusion. The aorta is normal in size. The main pulmonary artery is enlarged compatible with pulmonary artery hypertension. Right-sided central venous catheter tip ends at the caval atrial junction. Mediastinum/Nodes: Visualized thyroid gland is within normal limits. There are no enlarged mediastinal or hilar lymph nodes. Enteric tube is seen throughout nondilated esophagus. Lungs/Pleura: There are large bilateral pleural effusions, left greater than right. There is compressive atelectasis of the bilateral lower lobes. There is also some atelectasis of the bilateral upper lobes. The lungs are otherwise clear. Trachea and central airways appear patent. There is no pneumothorax. Musculoskeletal: No chest wall mass or suspicious bone lesions identified. CT ABDOMEN PELVIS FINDINGS Hepatobiliary: No focal liver lesions are identified. There is gallbladder wall edema, unchanged. The gallbladder is nondilated. There is no biliary ductal dilatation. Pancreas: Head and neck of the pancreas are within normal limits. 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 Extensive peripancreatic air-fluid collection is again noted extending into the mid abdomen tracking along the body and tail of the pancreas. Right-sided percutaneous drainage catheters have been removed in the interval. There is a new left-sided pigtail percutaneous catheter within the collection. Compared to the prior study this collection has not significantly changed in size. The largest areas near the pancreatic tail measuring 4.7 x 12.0 cm (previously 12.7 x 4.8 cm). Spleen: Normal in size without focal abnormality. Adrenals/Urinary Tract: Adrenal glands are unremarkable. Kidneys are normal, without renal calculi, focal lesion, or hydronephrosis. Bladder is unremarkable. Stomach/Bowel: There is no  evidence for bowel obstruction. The appendix is not seen. There is sigmoid colon diverticulosis without evidence for diverticulitis. There is new wall thickening and inflammation involving the splenic flexure of the colon. This portion of the colon abuts the. Pancreatic air-fluid collection. If fistula would be difficult to exclude. There is also diffuse wall thickening of small bowel loops with associated mesenteric edema similar to the prior study. Gastrojejunostomy tube is present. Stomach is decompressed. Rectal catheter is present. Vascular/Lymphatic: Aorta and IVC are normal in size. There are atherosclerotic calcifications of the aorta. Reproductive: Prostate is unremarkable. Other: Perihepatic air-fluid collection extending into the right paracolic gutter is unchanged in size and appearance. There is a new percutaneous pigtail catheter in the collection adjacent to the liver. Small amount of ascites is decreased, but there is stable diffuse omental edema. Air-fluid collection abutting the proximal stomach and left adrenal gland appears unchanged measuring 8.3 x 3.0 cm image 3/60. Bilateral lower retroperitoneal air-fluid collections abutting the iliopsoas muscles also appear unchanged. The largest is on the left measuring 4.9 x  5.3 cm image 3/99. Presacral edema is present. No new enhancing fluid collections are identified. Musculoskeletal: There is diffuse body wall edema similar to the prior study. No acute fractures. No focal osseous lesion. IMPRESSION: 1. Large bilateral pleural effusions. 2. Atelectasis of the bilateral lower lobes and upper lobes. 3. Cardiomegaly. Enlarged main pulmonary artery compatible with pulmonary artery hypertension. 4. Overall, air-fluid collections throughout the abdomen and pelvis have not significantly changed in size or distribution. Right-sided percutaneous catheters have been removed in the interval. There is a new left-sided pigtail catheter ending in the peripancreatic  collection and a new right-sided pigtail catheter ending in perihepatic collection. 5. New wall thickening of the splenic flexure of the colon worrisome for reactive colitis. Note is made that this section of the colon abuts air-fluid collection near the pancreas in fistula would be difficult to exclude. 6. Diffuse wall thickening of small bowel with mesenteric edema worrisome for nonspecific enteritis. Gastrojejunostomy tube in place. 7. Stable gallbladder wall edema, possibly reactive. Correlate for acute cholecystitis. Electronically Signed   By: Ronney Asters M.D.   On: 11/13/2020 21:58   CT ABDOMEN PELVIS W CONTRAST  Result Date: 11/13/2020 CLINICAL DATA:  Pneumonia. Evaluate for abdominal infection. History of necrotizing pancreatitis. EXAM: CT CHEST, ABDOMEN, AND PELVIS WITH CONTRAST TECHNIQUE: Multidetector CT imaging of the chest, abdomen and pelvis was performed following the standard protocol during bolus administration of intravenous contrast. CONTRAST:  148m OMNIPAQUE IOHEXOL 300 MG/ML  SOLN COMPARISON:  CT abdomen and pelvis 11/06/2020. FINDINGS: CT CHEST FINDINGS Cardiovascular: The heart is enlarged. There is no pericardial effusion. The aorta is normal in size. The main pulmonary artery is enlarged compatible with pulmonary artery hypertension. Right-sided central venous catheter tip ends at the caval atrial junction. Mediastinum/Nodes: Visualized thyroid gland is within normal limits. There are no enlarged mediastinal or hilar lymph nodes. Enteric tube is seen throughout nondilated esophagus. Lungs/Pleura: There are large bilateral pleural effusions, left greater than right. There is compressive atelectasis of the bilateral lower lobes. There is also some atelectasis of the bilateral upper lobes. The lungs are otherwise clear. Trachea and central airways appear patent. There is no pneumothorax. Musculoskeletal: No chest wall mass or suspicious bone lesions identified. CT ABDOMEN PELVIS  FINDINGS Hepatobiliary: No focal liver lesions are identified. There is gallbladder wall edema, unchanged. The gallbladder is nondilated. There is no biliary ductal dilatation. Pancreas: Head and neck of the pancreas are within normal limits. 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 Extensive peripancreatic air-fluid collection is again noted extending into the mid abdomen tracking along the body and tail of the pancreas. Right-sided percutaneous drainage catheters have been removed in the interval. There is a new left-sided pigtail percutaneous catheter within the collection. Compared to the prior study this collection has not significantly changed in size. The largest areas near the pancreatic tail measuring 4.7 x 12.0 cm (previously 12.7 x 4.8 cm). Spleen: Normal in size without focal abnormality. Adrenals/Urinary Tract: Adrenal glands are unremarkable. Kidneys are normal, without renal calculi, focal lesion, or hydronephrosis. Bladder is unremarkable. Stomach/Bowel: There is no evidence for bowel obstruction. The appendix is not seen. There is sigmoid colon diverticulosis without evidence for diverticulitis. There is new wall thickening and inflammation involving the splenic flexure of the colon. This portion of the colon abuts the. Pancreatic air-fluid collection. If fistula would be difficult to exclude. There is also diffuse wall thickening of small bowel loops with associated mesenteric edema similar to the prior  study. Gastrojejunostomy tube is present. Stomach is decompressed. Rectal catheter is present. Vascular/Lymphatic: Aorta and IVC are normal in size. There are atherosclerotic calcifications of the aorta. Reproductive: Prostate is unremarkable. Other: Perihepatic air-fluid collection extending into the right paracolic gutter is unchanged in size and appearance. There is a new percutaneous pigtail catheter in the collection adjacent to the liver. Small amount of  ascites is decreased, but there is stable diffuse omental edema. Air-fluid collection abutting the proximal stomach and left adrenal gland appears unchanged measuring 8.3 x 3.0 cm image 3/60. Bilateral lower retroperitoneal air-fluid collections abutting the iliopsoas muscles also appear unchanged. The largest is on the left measuring 4.9 x 5.3 cm image 3/99. Presacral edema is present. No new enhancing fluid collections are identified. Musculoskeletal: There is diffuse body wall edema similar to the prior study. No acute fractures. No focal osseous lesion. IMPRESSION: 1. Large bilateral pleural effusions. 2. Atelectasis of the bilateral lower lobes and upper lobes. 3. Cardiomegaly. Enlarged main pulmonary artery compatible with pulmonary artery hypertension. 4. Overall, air-fluid collections throughout the abdomen and pelvis have not significantly changed in size or distribution. Right-sided percutaneous catheters have been removed in the interval. There is a new left-sided pigtail catheter ending in the peripancreatic collection and a new right-sided pigtail catheter ending in perihepatic collection. 5. New wall thickening of the splenic flexure of the colon worrisome for reactive colitis. Note is made that this section of the colon abuts air-fluid collection near the pancreas in fistula would be difficult to exclude. 6. Diffuse wall thickening of small bowel with mesenteric edema worrisome for nonspecific enteritis. Gastrojejunostomy tube in place. 7. Stable gallbladder wall edema, possibly reactive. Correlate for acute cholecystitis. Electronically Signed   By: Ronney Asters M.D.   On: 11/13/2020 21:58   DG CHEST PORT 1 VIEW  Result Date: 11/14/2020 CLINICAL DATA:  Shortness of breath, recent cardiac arrest EXAM: PORTABLE CHEST 1 VIEW COMPARISON:  Film from the previous day. FINDINGS: Cardiac shadow is prominent but stable. Feeding catheter extends into the stomach. Endotracheal tube is noted in satisfactory  position. Right-sided PICC line is noted in satisfactory position. Known perihepatic drainage catheter is noted. Increasing right-sided pleural effusion is noted. Underlying atelectatic changes are likely present. Changes are similar to that seen on recent chest CT. Known left-sided effusion is not as well appreciated. No acute bony abnormality is noted. IMPRESSION: Increasing right-sided effusion with basilar atelectasis. The left-sided effusion is not as well appreciated on today's exam. Electronically Signed   By: Inez Catalina M.D.   On: 11/14/2020 02:15   DG Chest Port 1 View  Result Date: 11/13/2020 CLINICAL DATA:  Hypoxemia, dyspnea. EXAM: PORTABLE CHEST 1 VIEW COMPARISON:  11/06/2020 FINDINGS: Feeding tube is in place, tip beyond the gastroesophageal junction. Heart size is accentuated by technique. There are bilateral pleural effusions and bibasilar opacities which partially obscure the hemidiaphragms bilaterally. Increased airspace filling opacities. RIGHT-sided PICC line has been removed. RIGHT pigtail type catheter overlies the RIGHT lung base. IMPRESSION: Increased bilateral effusions and bibasilar opacities. Electronically Signed   By: Nolon Nations M.D.   On: 11/13/2020 15:32   [place] EKG SITE RITE  Result Date: 11/13/2020 If Site Rite image not attached, placement could not be confirmed due to current cardiac rhythm.  US Abdomen Limited RUQ (LIVER/GB)  Result Date: 11/14/2020 CLINICAL DATA:  Abdominal distension. Recent exploratory laparotomy with pancreatic debridement. EXAM: ULTRASOUND ABDOMEN LIMITED RIGHT UPPER QUADRANT COMPARISON:  CT scan November 06, 2020 FINDINGS: Gallbladder: Poorly evaluated due  to open incision and bandages. The gallbladder wall does appear thickened measuring up to 6.4 mm. Common bile duct: Diameter: 3.6 mm Liver: Diffuse increased echogenicity. Mildly nodular contour. No focal mass. Portal vein is patent on color Doppler imaging with normal direction of blood  flow towards the liver. Other: A right pleural effusion is identified. Ascites is identified as well. IMPRESSION: 1. Limited study due to open wound and bandages. 2. The gallbladder in particular is poorly evaluated. There does appear to be some gallbladder wall thickening measuring up to 6.4 mm. 3. No common bile duct dilatation. 4. Nodular contour to the liver raising the possibility of cirrhosis. 5. Right pleural effusion.  Ascites. Electronically Signed   By: Dorise Bullion III M.D.   On: 11/14/2020 06:38     Assessment/Plan: Necrotizing pancreatis with intra-abdominal abscess/fluid collection = currently on piptazo plus vancomycin. Will recommend to switch out vancomycin with linezolid in case enterococcus is VRE.   Agree with surgery's recommendation to repeat imaging in 5-7 days to see if fluid collections are reaccumulating in the previous imaging on 10/7, and if need to see if fistula is present.  Leukocytosis = trending down. Elevated count multifactorial from infection/leukamoid reaction from PEA code.   Critical illness = continues to be undermanagement of pulm/critical care for respiratory failure, pressor needs.  Overall condition remains guarded  [facility] Pager: [phone]  11/15/2020, 2:15 PM

## 2020-11-15 NOTE — Consult Note (Signed)
NAME:  Paul Chambers, MRN:  [id number], DOB:  June 05, 1965, LOS: 25 ADMISSION DATE:  10/16/2020, CONSULTATION DATE:  11/14/2020 REFERRING MD:  Dr. Tereasa Coop, Reason for consult: Acute respiratory failure  History of Present Illness:  Patient is encephalopathic and/or intubated. Therefore history has been obtained from chart review.   Paul Chambers, is a 55 y.o. male, who was admitted to [facility] on 10/24/2020 with abdominal pain.  With a pertinent pmx of alcoholism, HTN, gout, pancreatitis, tobacco use disorder.  He was found to have pneumoperitoneum without clear perforation. He was started on vanc and zosyn. He underwent ex lap on 9/9 and was found to have a large amount of necrotic fluid surrounding his pancreas, which was concerning for necrotic infected pancreas, two drains were placed. No bowel perforation was noted.  His hospital course has been complicated by acute respiratory failure secondary to possible aspiration pneumonia, acute blood loss anemia requiring transfusion, delirium, severe malnutrition requiring TPN. Was weaned of TPN and TF was started. He continued to spike fevers and was tachycardic despite antibiotics. ID consulted on 9/24 and changed abx to IV zosyn. Repeat CT demonsdtated new perihepatic fluid. On 9/26 IR placed 3 new drains. Fluid cultures resulted with enterococcus and clostridium.  The early morning of 10/2 PCCM was called to bedside for O2 sats in the 70's with the patient not responding to noxious stimuli. The patient was emergently intubated. Shortly after the patient had a PEA arrest with 2 minutes of CPR until ROSC. 1 epi was given. The patient was transferred to ICU.  Pertinent  Medical History  alcoholism, HTN, gout, pancreatitis, tobacco abuse.  Significant Hospital Events: Including procedures, antibiotic start and stop dates in addition to other pertinent events   9/8 - Presented to [facility] with abdominal pain. CT A/P with extensive pneumoperitoneum 9/9 - Ex-lap (no  colonic perforation discovered, large necrotic fluid present surrounding pancreas. Transferred to ICU post-op, intubated and sedated.  9/9-9/15 Fluconazole, 9/9-9/15 linezolid, zosyn 9/9-9/15, meropenem 9/15-9/19, levofloxacin 9/19-9/24, flagyl 9/19-9/24, zosyn 9/24-9/27, unasyn 9/27-9/30, zosyn 9/30-present, vancomycin 10/2-10/3, linezolid 10/3 9/10-received 1 unit PRBC for hemoglobin 6.7, went back on Levophed for short period of time 9/12 - Extubated to Venturi mask 9/13 - Off Fentanyl and Propofol gtt 9/15 Transferred to [facility] 9/24 IV Zosyn 9/26 CT a/p showed perihepatic fluid, IR Drains placed 10/2 PCCM consulted for respiratory failure, intubated, PEA arrest, 2 minutes CPR, Transferred to icu  Interim History / Subjective:  Patient intubated, some agitation ON precedex titrated. Weaned off of levophed.   Objective   Blood pressure 98/65, pulse 82, temperature 98.6 F (37 C), resp. rate (!) 31, height 5\' 9"  (1.753 m), weight 98.3 kg, SpO2 99 %.    Vent Mode: PRVC FiO2 (%):  [60 %-80 %] 60 % Set Rate:  [30 bmp] 30 bmp Vt Set:  [420 mL] 420 mL PEEP:  [12 cmH20] 12 cmH20 Plateau Pressure:  [22 cmH20-32 cmH20] 22 cmH20   Intake/Output Summary (Last 24 hours) at 11/15/2020 1220 Last data filed at 11/15/2020 1100 Gross per 24 hour  Intake 2530.73 ml  Output 2235 ml  Net 295.73 ml    Filed Weights   11/04/20 0603 11/08/20 0500 11/11/20 0500  Weight: 100.2 kg 96.1 kg 98.3 kg    Examination: General: In bed, NAD, appears comfortable HEENT: MM pink/moist, anicteric, atraumatic Neuro: RASS 0 to +1, PERRL 36mm, sedated CV: S1S2, ST, no m/r/g appreciated PULM:  rhonchi  in the upper lobes, rhonchi  in the lower lobes,  trachea midline, chest expansion symmetric GI: distended, bsx4 hypoactive, firm, x3 JP drains, yellow drainage from jp sites, midline wound dressed c/d/I  Extremities: warm/dry, no pretibial edema, capillary refill less than 3 seconds     Resolved Hospital Problem  list   Transaminitis AKI  Assessment & Plan:   Acute respiratory failure with hypoxia Likely due to aspiration pneumonitis resulting in ALI with concomitant atelectasis 2/2 moderate bilateral pleural effusions which were seen on 10/1 CT chest  -Low tidal volume strategy with tidal volumes of 4-8 cc/kg ideal body weight -Goal plateau pressures less than 30 and driving pressures less than 15 -Wean PEEP/FiO2 for SpO2 92-98% -VAP bundle -S/p lasix 40mg  IV x1 10/2, repeat lasix 40mg  IV this AM, may require thoracentesis if difficult to wean vent settings -Daily SAT and SBT -PAD bundle with Precedex gtt and fentanyl push -RASS goal 0 to -1 -ABX as discussed below  Septic shock secondary to infected necrotizing pancreatitis, possible aspiration pneumonia Intra abdominal abscess- JP drain culture growth with rare enterococcus faecalis, clostridium WBC improving on AM labs, Patient removed x2 JP drains on 10/1, x3 JP drains in place CT abdomen 10/1 with air-fluid collections throughout the abdomen. Air fluid collection near pancreas. Gallbladder wall edema.   -ID consulted appreciate assistance -Continue zosyn, start linezolid 10/3 AM,  s/p vancomycin 10/2 -Goal MAP greater than 65. Off of levophed currently  -General surgery consulted and following. Surgical site management and drain management per GS. Per general surgery may require repeat CT A/P later this week -Palliative care consult  In hospital PEA arrest On 10/2. Suspect secondary to hypoxia/hypotension. Occurred shortly after intubation. 2 min cpr. 1 epi given RSI drugs limiting post ROSC neuro exam. -CTM   Acute metabolic encephalopathy Suspect secondary to sepsis, hypoxia, and elevated ammonia level  -follows commands  this AM  -Continue antibiotics per above  -Continue lactulose   Acute blood loss anemia  -Transfuse PRBC if HBG less than 7 -Monitor for signs of bleeding  DM2 -Patient previously on novolog 8u q6h, semeglee  20u qd, +SSI, low blood sugar this AM of 68. Will discontinue scheduled novolog  Diarrhea Suspect secondary to TF + lactulose  -continue FMS   Likely alcoholic cirrhosis  HX ETOH abuse -INR elevated 2 on admission,  RUQ 12/2 10/2 with nodular contour and ascites.  -Consider adding spironolactone given AKI has resolved and persistent pleural effusions and ascitices  -Consider further imaging to characterize liver  -Thiamine, folate   Severe protein calorie malnutrition -continue TF, titrate up to goal   Best Practice (right click and "Reselect all SmartList Selections" daily)   Diet/type: NPO DVT prophylaxis: prophylactic heparin  GI prophylaxis: PPI Lines: yes and it is still needed Foley:  N/A Code Status:  full code Last date of multidisciplinary goals of care discussion [Pending]  Labs   CBC: Recent Labs  Lab 11/11/20 0415 11/12/20 1142 11/13/20 0054 11/14/20 0240 11/14/20 0449 11/14/20 0650 11/15/20 1019  WBC 18.2* 17.3* 14.9*  --   --  22.6* 16.4*  NEUTROABS  --   --  12.4*  --   --   --   --   HGB 8.3* 8.9* 8.6* 9.5* 10.2* 8.7* 8.0*  HCT 26.5* 28.8* 28.3* 28.0* 30.0* 29.4* 26.9*  MCV 101.1* 103.2* 102.9*  --   --  106.5* 106.3*  PLT 368 389 382  --   --  402* 263     Basic Metabolic Panel: Recent Labs  Lab 11/10/20 0430 11/11/20 0415  11/13/20 0054 11/14/20 0240 11/14/20 0449 11/14/20 0508 11/14/20 0510 11/15/20 0455  NA 130* 134* 137 144 143 141 139 142  K 3.6 3.8 3.5 3.5 4.0 5.2* 4.2 3.7  CL 96* 98 102  --   --  107 105 106  CO2 30 29 28   --   --  22 23 29   GLUCOSE 147* 214* 224*  --   --  154* 148* 148*  BUN 9 6 10   --   --  12 11 15   CREATININE 0.52* 0.53* 0.57*  --   --  0.43* 0.53* 0.58*  CALCIUM 7.8* 7.9* 7.9*  --   --  8.4* 8.2* 8.3*  MG 2.0 1.9 2.0  --   --   --  2.0 2.1  PHOS 2.7 2.5 2.1*  --   --  4.3  --  3.9    GFR: Estimated Creatinine Clearance: 124.8 mL/min (A) (by C-G formula based on SCr of 0.58 mg/dL (L)). Recent Labs  Lab  11/12/20 1142 11/13/20 0054 11/14/20 0507 11/14/20 0650 11/14/20 0936 11/15/20 1019  WBC 17.3* 14.9*  --  22.6*  --  16.4*  LATICACIDVEN  --   --  1.9  --  1.8  --      Liver Function Tests: Recent Labs  Lab 11/11/20 0415 11/13/20 0054 11/14/20 0508 11/14/20 0510 11/15/20 0455  AST 26 22  --  23 19  ALT 14 15  --  14 15  ALKPHOS 91 90  --  86 73  BILITOT 0.8 0.7  --  0.7 0.6  PROT 5.6* 5.8*  --  6.1* 6.0*  ALBUMIN 1.6* 1.6* 1.6* 1.7* 1.7*    Recent Labs  Lab 11/13/20 0054  LIPASE 29    Recent Labs  Lab 11/13/20 0054 11/14/20 0508  AMMONIA 155* 73*     ABG    Component Value Date/Time   PHART 7.304 (L) 11/14/2020 0449   PCO2ART 65.4 (HH) 11/14/2020 0449   PO2ART 55 (L) 11/14/2020 0449   HCO3 32.3 (H) 11/14/2020 0449   TCO2 34 (H) 11/14/2020 0449   O2SAT 83.0 11/14/2020 0449      Coagulation Profile: No results for input(s): INR, PROTIME in the last 168 hours.  Cardiac Enzymes: No results for input(s): CKTOTAL, CKMB, CKMBINDEX, TROPONINI in the last 168 hours.   HbA1C: Hgb A1c MFr Bld  Date/Time Value Ref Range Status  10/18/2020 10:13 PM 6.8 (H) 4.8 - 5.6 % Final    Comment:    (NOTE) Pre diabetes:          5.7%-6.4%  Diabetes:              >6.4%  Glycemic control for   <7.0% adults with diabetes     CBG: Recent Labs  Lab 11/14/20 1956 11/15/20 0059 11/15/20 0452 11/15/20 0717 11/15/20 1112  GLUCAP 108* 101* 145* 68* 100*     Review of Systems:   Unable to obtain due to patient status.  Past Medical History:  He,  has a past medical history of Alcoholism (HCC), Gout, Hypertension, Pancreatitis, and Tobacco abuse.   Surgical History:   Past Surgical History:  Procedure Laterality Date   LAPAROSCOPY N/A 11/06/2020   Procedure: LAPAROSCOPY DIAGNOSTIC;  Surgeon: 01/15/21 01/15/21, MD;  Location: [facility] OR;  Service: General;  Laterality: N/A;   LAPAROTOMY N/A 10/20/2020   Procedure: EXPLORATORY LAPAROTOMY AND PANCREATIC  DEBRIDEMENT;  Surgeon: Sheliah Hatch De Blanch, MD;  Location: [facility] OR;  Service: General;  Laterality:  N/A;     Social History:   reports that he has been smoking cigarettes. He does not have any smokeless tobacco history on file. He reports that he does not currently use alcohol.   Family History:  His family history includes Diverticulitis in his mother; Liver cancer in his maternal aunt.   Allergies No Known Allergies   Home Medications  Prior to Admission medications   Medication Sig Start Date End Date Taking? Authorizing Provider  acetaminophen (TYLENOL) 500 MG tablet Take 500 mg by mouth every 6 (six) hours as needed for mild pain.   Yes [provider]  albuterol (VENTOLIN HFA) 108 (90 Base) MCG/ACT inhaler Inhale 1-2 puffs into the lungs every 6 (six) hours as needed for wheezing or shortness of breath.   Yes [provider]  allopurinol (ZYLOPRIM) 300 MG tablet Take 300 mg by mouth daily.   Yes [provider]  ALPRAZolam Prudy Feeler) 0.5 MG tablet Take 0.5 mg by mouth 3 (three) times daily as needed for anxiety.   Yes [provider]  amLODipine (NORVASC) 5 MG tablet Take 5 mg by mouth daily.   Yes [provider]  Fluticasone-Umeclidin-Vilant (TRELEGY ELLIPTA) 100-62.5-25 MCG/INH AEPB Inhale 1 puff into the lungs daily.   Yes [provider]  folic acid (FOLVITE) 1 MG tablet Take 1 mg by mouth daily.   Yes [provider]  furosemide (LASIX) 40 MG tablet Take 40 mg by mouth 2 (two) times daily.   Yes [provider]  Multiple Vitamins-Minerals (PRESERVISION AREDS 2+MULTI VIT PO) Take 2 tablets by mouth daily.   Yes [provider]  Omega-3 Krill Oil 500 MG CAPS Take 1,000 mg by mouth daily.   Yes [provider]  omeprazole (PRILOSEC) 40 MG capsule Take 40 mg by mouth daily.   Yes [provider]  oxyCODONE (OXY IR/ROXICODONE) 5 MG immediate release tablet Take 2.5-5 mg by mouth every 6  (six) hours as needed for severe pain.   Yes [provider]  thiamine (VITAMIN B-1) 100 MG tablet Take 100 mg by mouth daily.   Yes [provider]      Marolyn Haller, MD PGY-2 Internal Medicine  Pager [phone]   Critical care time: 35 minutes

## 2020-11-15 NOTE — Progress Notes (Signed)
Inpatient Rehab Admissions Coordinator:   Pt. Intubated on vent, not appropriate for CIR at this time. Pt. Likely to need LTACH even once extubated. CIR to sign off.  Megan Salon, MS, CCC-SLP Rehab Admissions Coordinator  [phone] (celll) [phone] (office)

## 2020-11-16 ENCOUNTER — Inpatient Hospital Stay (HOSPITAL_COMMUNITY): Payer: BC Managed Care – PPO

## 2020-11-16 DIAGNOSIS — K8591 Acute pancreatitis with uninfected necrosis, unspecified: Secondary | ICD-10-CM | POA: Diagnosis not present

## 2020-11-16 LAB — GLUCOSE, CAPILLARY
Glucose-Capillary: 204 mg/dL — ABNORMAL HIGH (ref 70–99)
Glucose-Capillary: 175 mg/dL — ABNORMAL HIGH (ref 70–99)
Glucose-Capillary: 182 mg/dL — ABNORMAL HIGH (ref 70–99)
Glucose-Capillary: 110 mg/dL — ABNORMAL HIGH (ref 70–99)
Glucose-Capillary: 123 mg/dL — ABNORMAL HIGH (ref 70–99)
Glucose-Capillary: 129 mg/dL — ABNORMAL HIGH (ref 70–99)
Glucose-Capillary: 113 mg/dL — ABNORMAL HIGH (ref 70–99)

## 2020-11-16 LAB — POCT I-STAT 7, (LYTES, BLD GAS, ICA,H+H)
Acid-Base Excess: 4 mmol/L — ABNORMAL HIGH (ref 0.0–2.0)
Bicarbonate: 28.1 mmol/L — ABNORMAL HIGH (ref 20.0–28.0)
Calcium, Ion: 1.22 mmol/L (ref 1.15–1.40)
HCT: 24 % — ABNORMAL LOW (ref 39.0–52.0)
Hemoglobin: 8.2 g/dL — ABNORMAL LOW (ref 13.0–17.0)
O2 Saturation: 89 %
Patient temperature: 100.6
Potassium: 3.9 mmol/L (ref 3.5–5.1)
Sodium: 143 mmol/L (ref 135–145)
TCO2: 29 mmol/L (ref 22–32)
pCO2 arterial: 43.4 mmHg (ref 32.0–48.0)
pH, Arterial: 7.424 (ref 7.350–7.450)
pO2, Arterial: 59 mmHg — ABNORMAL LOW (ref 83.0–108.0)

## 2020-11-16 LAB — BASIC METABOLIC PANEL
Anion gap: 10 (ref 5–15)
BUN: 17 mg/dL (ref 6–20)
CO2: 27 mmol/L (ref 22–32)
Calcium: 8.1 mg/dL — ABNORMAL LOW (ref 8.9–10.3)
Chloride: 103 mmol/L (ref 98–111)
Creatinine, Ser: 0.68 mg/dL (ref 0.61–1.24)
GFR, Estimated: >60 mL/min (ref >60)
Glucose, Bld: 208 mg/dL — ABNORMAL HIGH (ref 70–99)
Potassium: 4 mmol/L (ref 3.5–5.1)
Sodium: 140 mmol/L (ref 135–145)

## 2020-11-16 LAB — CBC
HCT: 26.5 % — ABNORMAL LOW (ref 39.0–52.0)
Hemoglobin: 7.7 g/dL — ABNORMAL LOW (ref 13.0–17.0)
MCH: 30.7 pg (ref 26.0–34.0)
MCHC: 29.1 g/dL — ABNORMAL LOW (ref 30.0–36.0)
MCV: 105.6 fL — ABNORMAL HIGH (ref 80.0–100.0)
Platelets: 247 10*3/uL (ref 150–400)
RBC: 2.51 MIL/uL — ABNORMAL LOW (ref 4.22–5.81)
RDW: 18.6 % — ABNORMAL HIGH (ref 11.5–15.5)
WBC: 17.9 10*3/uL — ABNORMAL HIGH (ref 4.0–10.5)
nRBC: 0.1 % (ref 0.0–0.2)

## 2020-11-16 LAB — AMMONIA: Ammonia: 38 umol/L — ABNORMAL HIGH (ref 9–35)

## 2020-11-16 LAB — MAGNESIUM: Magnesium: 2.1 mg/dL (ref 1.7–2.4)

## 2020-11-16 MED ORDER — REVEFENACIN 175 MCG/3ML IN SOLN
175.0000 ug | Freq: Every day | RESPIRATORY_TRACT | Status: DC
  Administered 2020-11-16 – 2020-11-27 (×12): 175 ug via RESPIRATORY_TRACT
  Filled 2020-11-16 (×12): qty 3

## 2020-11-16 MED ORDER — SODIUM CHLORIDE 0.9 % IV SOLN
100.0000 mg | INTRAVENOUS | Status: DC
  Administered 2020-11-17 – 2020-11-22 (×6): 100 mg via INTRAVENOUS
  Filled 2020-11-16 (×7): qty 100

## 2020-11-16 MED ORDER — SODIUM CHLORIDE 0.9 % IV SOLN
200.0000 mg | Freq: Once | INTRAVENOUS | Status: AC
  Administered 2020-11-16: 200 mg via INTRAVENOUS
  Filled 2020-11-16: qty 200

## 2020-11-16 MED ORDER — INSULIN GLARGINE-YFGN 100 UNIT/ML ~~LOC~~ SOLN
10.0000 [IU] | Freq: Every day | SUBCUTANEOUS | Status: DC
  Administered 2020-11-16: 10 [IU] via SUBCUTANEOUS
  Filled 2020-11-16 (×2): qty 0.1

## 2020-11-16 MED ORDER — FUROSEMIDE 40 MG PO TABS: 20.0000 mg | ORAL_TABLET | Freq: Every day | ORAL | Status: DC

## 2020-11-16 MED ORDER — IOHEXOL 350 MG/ML SOLN
80.0000 mL | Freq: Once | INTRAVENOUS | Status: AC | PRN
  Administered 2020-11-16: 80 mL via INTRAVENOUS

## 2020-11-16 MED ORDER — ARFORMOTEROL TARTRATE 15 MCG/2ML IN NEBU
15.0000 ug | INHALATION_SOLUTION | Freq: Two times a day (BID) | RESPIRATORY_TRACT | Status: DC
  Administered 2020-11-16 – 2020-11-27 (×23): 15 ug via RESPIRATORY_TRACT
  Filled 2020-11-16 (×24): qty 2

## 2020-11-16 MED ORDER — FUROSEMIDE 10 MG/ML IJ SOLN
40.0000 mg | Freq: Once | INTRAMUSCULAR | Status: AC
  Administered 2020-11-16: 40 mg via INTRAVENOUS
  Filled 2020-11-16: qty 4

## 2020-11-16 MED ORDER — IOHEXOL 9 MG/ML PO SOLN
ORAL | Status: AC
  Administered 2020-11-16: 1000 mL
  Filled 2020-11-16: qty 1000

## 2020-11-16 MED ORDER — FENTANYL 2500MCG IN NS 250ML (10MCG/ML) PREMIX INFUSION
50.0000 ug/h | INTRAVENOUS | Status: DC
Start: 2020-11-16 — End: 2020-11-19
  Filled 2020-11-16: qty 250

## 2020-11-16 MED ORDER — NOREPINEPHRINE 16 MG/250ML-% IV SOLN
0.0000 ug/min | INTRAVENOUS | Status: DC
  Administered 2020-11-16: 10 ug/min via INTRAVENOUS
  Filled 2020-11-16: qty 250

## 2020-11-16 MED ORDER — POLYETHYLENE GLYCOL 3350 17 G PO PACK: 17.0000 g | PACK | Freq: Every day | ORAL | Status: DC | PRN

## 2020-11-16 NOTE — Progress Notes (Signed)
Pt transported on ventilator from 2M04 to CT2 and back without complication. RT, RN and transport accompanied pt. VSS throughout. RT will continue to monitor and be available as needed.

## 2020-11-16 NOTE — Progress Notes (Signed)
Planning for RP drain exchanges/upsize tomorrow with sedation.  Will make NPO at midnight.  RN aware.  Will consent in IR tomorrow.   Electronically Signed: Sheliah Plane 11/16/2020, 4:13 PM

## 2020-11-16 NOTE — Progress Notes (Signed)
[facility]    Date of Admission:  10/26/2020     ID: Paul Chambers is a 55 y.o. male with  necrotic pancreatitis s/p ex lab for pancreatic debridement and drain placement Principal Problem:   Acute necrotizing pancreatitis Active Problems:   Toxic encephalopathy   Severe sepsis (HCC)   Hyponatremia   Hypoalbuminemia   Macrocytic anemia   Pressure injury of skin   Protein-calorie malnutrition, severe   Intra-abdominal abscess ([place])   Acute respiratory failure with hypoxia (HCC)   Septic shock (HCC)   Cardiac arrest (HCC)    Subjective:  Intermittent fever of 100.4-100.41F. not requiring any pressors. But not as alert as yesterday. remains Alert to verbal stimuli. RN reports he follows direction. Continues to be intubated with 60% FIO2. On going output of purulent drainage in drains. CXR shows: Improved aeration in the right lung base although persistent opacity is seen. Bilateral small effusions improved on the right.  Medications:   arformoterol  15 mcg Nebulization BID   chlorhexidine gluconate (MEDLINE KIT)  15 mL Mouth Rinse BID   Chlorhexidine Gluconate Cloth  6 each Topical Q0600   feeding supplement (PROSource TF)  45 mL Per Tube TID   folic acid  1 mg Per Tube Daily   heparin  5,000 Units Subcutaneous Q8H   insulin aspart  0-20 Units Subcutaneous Q4H   insulin glargine-yfgn  10 Units Subcutaneous Daily   lactulose  20 g Per Tube TID   mouth rinse  15 mL Mouth Rinse 10 times per day   pantoprazole (PROTONIX) IV  40 mg Intravenous Q24H   QUEtiapine  25 mg Per Tube QHS   revefenacin  175 mcg Nebulization Daily   sodium chloride flush  10-40 mL Intracatheter Q12H   sodium chloride flush  5 mL Intracatheter Q8H   sodium chloride flush  5 mL Intracatheter Q8H   sodium chloride flush  5 mL Intracatheter Q8H   spironolactone  25 mg Per Tube Daily   thiamine  100 mg Per Tube Daily    Objective: Vital signs in last 24 hours: Temp:  [97.7  F (36.5 C)-101.7 F (38.7 C)] 98.1 F (36.7 C) (10/04 1104) Pulse Rate:  [76-108] 77 (10/04 1100) Resp:  [18-36] 30 (10/04 1100) BP: (86-128)/(54-81) 86/56 (10/04 1100) SpO2:  [91 %-100 %] 100 % (10/04 1100) FiO2 (%):  [40 %-60 %] 60 % (10/04 1143) Physical Exam  Constitutional: He is oriented to person, place, and time. He appears well-developed and well-nourished. No distress.  HENT:  Mouth/Throat: Oropharynx is clear and moist. No oropharyngeal exudate.  Cardiovascular: Normal rate, regular rhythm and normal heart sounds. Exam reveals no gallop and no friction rub.  No murmur heard.  Pulmonary/Chest: Effort normal and breath sounds normal. No respiratory distress. He has no wheezes.  Abdominal: Soft. Bowel sounds are decreased. distension. There is no tenderness. Midline incision dressing in place. Purulent feculant material in drains. Lymphadenopathy:  He has no cervical adenopathy.  Neurological: He is alert and oriented to person, place, and time.  Skin: Skin is warm and dry. No rash noted. No erythema.  Psychiatric: He has a normal mood and affect. His behavior is normal.    Lab Results Recent Labs    11/15/20 0455 11/15/20 1019 11/16/20 0339 11/16/20 0412  WBC  --  16.4* 17.9*  --   HGB  --  8.0* 7.7* 8.2*  HCT  --  26.9* 26.5* 24.0*  NA 142  --  140 143  K 3.7  --  4.0 3.9  CL 106  --  103  --   CO2 29  --  27  --   BUN 15  --  17  --   CREATININE 0.58*  --  0.68  --    Liver Panel Recent Labs    11/14/20 0510 11/15/20 0455  PROT 6.1* 6.0*  ALBUMIN 1.7* 1.7*  AST 23 19  ALT 14 15  ALKPHOS 86 73  BILITOT 0.7 0.6   Sedimentation Rate No results for input(s): ESRSEDRATE in the last 72 hours. C-Reactive Protein No results for input(s): CRP in the last 72 hours.  Microbiology: Reviewed - abd drain - enterococcus and clostridial species. Trach aspirate =stenotrophomonas Studies/Results: DG CHEST PORT 1 VIEW  Result Date: 11/16/2020 CLINICAL DATA:   Respiratory failure EXAM: PORTABLE CHEST 1 VIEW COMPARISON:  11/14/2020 FINDINGS: Endotracheal tube and feeding catheter are noted in satisfactory position. Cardiac shadow is stable. Persistent right basilar airspace opacity is noted although the right-sided effusion is decreased when compare with the prior exam secondary to drainage catheter. Small left-sided pleural effusion is noted. IMPRESSION: Improved aeration in the right lung base although persistent opacity is seen. Bilateral small effusions improved on the right. Electronically Signed   By: Inez Catalina M.D.   On: 11/16/2020 03:46     Assessment/Plan:  Necrotizing pancreatitis with intra-abdominal infection ( polymicrobial) = continue on linezolid plus pip/tazo, concern for bowel leak given purulent/feculant output. Will add anidulafungin for anti-fungal coverage given still ongoing leukocytosis and intermittent low grade fevers.  Leukocytosis = concern for uncontrolled source, or lack of coverage/probable perforated colon. Given critical illness, will start antifungal coverage  Acute respiratory illness  with hypoxia = being treated for aspiration pneumonitis, on broad spectrum abtx. He is previously colonized with stenotrophomonas. Not treated at this time.  [name] [place] [facility] Pager: [phone]  11/16/2020, 12:09 PM

## 2020-11-16 NOTE — Progress Notes (Signed)
NAME:  Paul Chambers, MRN:  [id number], DOB:  [birth year], LOS: 26 ADMISSION DATE:  10/29/2020, CONSULTATION DATE:  11/14/2020 REFERRING MD:  Dr. Tereasa Coop, Reason for consult: Acute respiratory failure  History of Present Illness:  Patient is encephalopathic and/or intubated. Therefore history has been obtained from chart review.   Paul Chambers, is a 55 y.o. male, who was admitted to [facility] on 11/07/2020 with abdominal pain.  With a pertinent pmx of alcoholism, HTN, gout, pancreatitis, tobacco use disorder.  He was found to have pneumoperitoneum without clear perforation. He was started on vanc and zosyn. He underwent ex lap on 9/9 and was found to have a large amount of necrotic fluid surrounding his pancreas, which was concerning for necrotic infected pancreas, two drains were placed. No bowel perforation was noted.  His hospital course has been complicated by acute respiratory failure secondary to possible aspiration pneumonia, acute blood loss anemia requiring transfusion, delirium, severe malnutrition requiring TPN. Was weaned of TPN and TF was started. He continued to spike fevers and was tachycardic despite antibiotics. ID consulted on 9/24 and changed abx to IV zosyn. Repeat CT demonsdtated new perihepatic fluid. On 9/26 IR placed 3 new drains. Fluid cultures resulted with enterococcus and clostridium.  The early morning of 10/2 PCCM was called to bedside for O2 sats in the 70's with the patient not responding to noxious stimuli. The patient was emergently intubated. Shortly after the patient had a PEA arrest with 2 minutes of CPR until ROSC. 1 epi was given. The patient was transferred to ICU.  Pertinent  Medical History  alcoholism, HTN, gout, pancreatitis, tobacco abuse, cirrhosis   Significant Hospital Events: Including procedures, antibiotic start and stop dates in addition to other pertinent events   9/8 - Presented to [facility] with abdominal pain. CT A/P with extensive pneumoperitoneum 9/9 -  Ex-lap (no colonic perforation discovered, large necrotic fluid present surrounding pancreas. Transferred to ICU post-op, intubated and sedated.  9/9-9/15 Fluconazole, 9/9-9/15 linezolid, zosyn 9/9-9/15, meropenem 9/15-9/19, levofloxacin 9/19-9/24, flagyl 9/19-9/24, zosyn 9/24-9/27, unasyn 9/27-9/30, zosyn 9/30-present, vancomycin 10/2-10/3, linezolid 10/3 9/10-received 1 unit PRBC for hemoglobin 6.7, went back on Levophed for short period of time 9/12 - Extubated to Venturi mask 9/13 - Off Fentanyl and Propofol gtt 9/15 Transferred to [facility] 9/24 IV Zosyn 9/26 CT a/p showed perihepatic fluid, IR Drains placed 10/2 PCCM consulted for respiratory failure, intubated, PEA arrest s/p intubation, 2 minutes CPR, Transferred to icu  Interim History / Subjective:  Patient intubated, awake and alert off of pressors. ETT readjusted this AM given O2 sats bite block also added. Vent FIO2 increased to 60% ON.   Objective   Blood pressure 101/63, pulse 96, temperature 98.1 F (36.7 C), temperature source Core, resp. rate (!) 23, height 5\' 9"  (1.753 m), weight 98.3 kg, SpO2 100 %.    Vent Mode: PRVC FiO2 (%):  [40 %-60 %] 60 % Set Rate:  [30 bmp] 30 bmp Vt Set:  [420 mL] 420 mL PEEP:  [10 cmH20-12 cmH20] 10 cmH20 Plateau Pressure:  [22 cmH20-28 cmH20] 22 cmH20   Intake/Output Summary (Last 24 hours) at 11/16/2020 1134 Last data filed at 11/16/2020 1130 Gross per 24 hour  Intake 3679.96 ml  Output 2270 ml  Net 1409.96 ml    Filed Weights   11/04/20 0603 11/08/20 0500 11/11/20 0500  Weight: 100.2 kg 96.1 kg 98.3 kg    Examination: General: In bed, NAD, appears comfortable HEENT: MM pink/moist, anicteric, atraumatic Neuro: RASS 0 to +1, PERRL  38mm CV: S1S2, ST, no m/r/g appreciated PULM:  No rales or rhonchi, trachea midline, chest expansion symmetric GI: distended, bsx4 hypoactive, firm, x3 JP drains, yellow drainage from jp sites, midline wound dressed c/d/I  Extremities: warm/dry, no pretibial  edema, capillary refill less than 3 seconds     Resolved Hospital Problem list   Transaminitis AKI  Assessment & Plan:   Acute respiratory failure with hypoxia Likely due to aspiration pneumonitis resulting in ALI with concomitant atelectasis 2/2 moderate bilateral pleural effusions which were seen on 10/1 CT chest  -Low tidal volume strategy with tidal volumes of 4-8 cc/kg ideal body weight -Goal plateau pressures less than 30 and driving pressures less than 15 -Wean PEEP/FiO2 for SpO2 92-98% -VAP bundle -S/p lasix 40mg  IV x1 10/2, 10/3, repeat lasix 40mg  IV this AM, plus spironolactone per below may require thoracentesis if difficult to wean vent settings -Daily SAT and SBT -PAD bundle with Precedex gtt and fentanyl push -RASS goal 0 to -1 -ABX as discussed below  Septic shock secondary to infected necrotizing pancreatitis, possible aspiration pneumonia Intra abdominal abscess- JP drain culture growth with rare enterococcus faecalis, clostridium WBC worsened on AM labs, and patient febrile to 100.10F ON. In addition patient noted to have increased purulent appearing drainage from R sided abdominal drain.   Patient removed x2 JP drains on 10/1, x3 JP drains in place CT abdomen 10/1 with air-fluid collections throughout the abdomen. Air fluid collection near pancreas. Gallbladder wall edema.   -ID consulted appreciate assistance -Continue zosyn, continue linezolid 10/3 AM,  s/p vancomycin 10/2, eraxis started 10/4 AM  -Goal MAP greater than 65. Off of levophed currently  -General surgery consulted and following. Surgical site management and drain management per GS.  -F/u CT A/P c/f possible worsened abscesses v ?perforation   Acute metabolic encephalopathy Suspect secondary to sepsis, hypoxia, and elevated ammonia level  -follows commands  this AM  -Continue antimicrobials per above  -Continue lactulose   Acute blood loss anemia  -Transfuse PRBC if HBG less than 7 -Monitor for  signs of bleeding  DM2 -Patient previously on novolog 8u q6h, semeglee 20u qd, +SSI. Required 21u of SSI with scheduled novolog discontinued 10/3 AM. Patient also did not get long acting. -Resume long acting insulin at lower dose of semeglee 10u qd and continue SSI   Diarrhea Suspect secondary to TF + lactulose  -continue FMS   Likely alcoholic cirrhosis  HX ETOH abuse -INR elevated, 2 on admission,  RUQ 10/2 with nodular contour and ascites.  -Spironolactone 25mg  started 10/4 to help with pleural effusions and ascites   -Consider further imaging to characterize liver  -Thiamine, folate   Severe protein calorie malnutrition -continue TF, titrate up to goal  In hospital PEA arrest On 10/2. Suspect secondary to hypoxia/hypotension. Occurred shortly after intubation. 2 min cpr. 1 epi given RSI drugs limiting post ROSC neuro exam. -CTM   Best Practice (right click and "Reselect all SmartList Selections" daily)   Diet/type: NPO Tfs  DVT prophylaxis: prophylactic heparin  GI prophylaxis: PPI Lines: yes and it is still needed Foley:  N/A Code Status:  full code Last date of multidisciplinary goals of care discussion [Pending]  Labs   CBC: Recent Labs  Lab 11/12/20 1142 11/13/20 0054 11/14/20 0240 11/14/20 0449 11/14/20 0650 11/15/20 1019 11/16/20 0339 11/16/20 0412  WBC 17.3* 14.9*  --   --  22.6* 16.4* 17.9*  --   NEUTROABS  --  12.4*  --   --   --   --   --   --  HGB 8.9* 8.6*   < > 10.2* 8.7* 8.0* 7.7* 8.2*  HCT 28.8* 28.3*   < > 30.0* 29.4* 26.9* 26.5* 24.0*  MCV 103.2* 102.9*  --   --  106.5* 106.3* 105.6*  --   PLT 389 382  --   --  402* 263 247  --    < > = values in this interval not displayed.     Basic Metabolic Panel: Recent Labs  Lab 11/10/20 0430 11/11/20 0415 11/13/20 0054 11/14/20 0240 11/14/20 0508 11/14/20 0510 11/15/20 0455 11/16/20 0339 11/16/20 0412  NA 130* 134* 137   < > 141 139 142 140 143  K 3.6 3.8 3.5   < > 5.2* 4.2 3.7 4.0  3.9  CL 96* 98 102  --  107 105 106 103  --   CO2 30 29 28   --  22 23 29 27   --   GLUCOSE 147* 214* 224*  --  154* 148* 148* 208*  --   BUN 9 6 10   --  12 11 15 17   --   CREATININE 0.52* 0.53* 0.57*  --  0.43* 0.53* 0.58* 0.68  --   CALCIUM 7.8* 7.9* 7.9*  --  8.4* 8.2* 8.3* 8.1*  --   MG 2.0 1.9 2.0  --   --  2.0 2.1 2.1  --   PHOS 2.7 2.5 2.1*  --  4.3  --  3.9  --   --    < > = values in this interval not displayed.    GFR: Estimated Creatinine Clearance: 124.8 mL/min (by C-G formula based on SCr of 0.68 mg/dL). Recent Labs  Lab 11/13/20 0054 11/14/20 0507 11/14/20 0650 11/14/20 0936 11/15/20 1019 11/16/20 0339  WBC 14.9*  --  22.6*  --  16.4* 17.9*  LATICACIDVEN  --  1.9  --  1.8  --   --      Liver Function Tests: Recent Labs  Lab 11/11/20 0415 11/13/20 0054 11/14/20 0508 11/14/20 0510 11/15/20 0455  AST 26 22  --  23 19  ALT 14 15  --  14 15  ALKPHOS 91 90  --  86 73  BILITOT 0.8 0.7  --  0.7 0.6  PROT 5.6* 5.8*  --  6.1* 6.0*  ALBUMIN 1.6* 1.6* 1.6* 1.7* 1.7*    Recent Labs  Lab 11/13/20 0054  LIPASE 29    Recent Labs  Lab 11/13/20 0054 11/14/20 0508 11/16/20 0339  AMMONIA 155* 73* 38*     ABG    Component Value Date/Time   PHART 7.424 11/16/2020 0412   PCO2ART 43.4 11/16/2020 0412   PO2ART 59 (L) 11/16/2020 0412   HCO3 28.1 (H) 11/16/2020 0412   TCO2 29 11/16/2020 0412   O2SAT 89.0 11/16/2020 0412      Coagulation Profile: No results for input(s): INR, PROTIME in the last 168 hours.  Cardiac Enzymes: No results for input(s): CKTOTAL, CKMB, CKMBINDEX, TROPONINI in the last 168 hours.   HbA1C: Hgb A1c MFr Bld  Date/Time Value Ref Range Status  10/28/2020 10:13 PM 6.8 (H) 4.8 - 5.6 % Final    Comment:    (NOTE) Pre diabetes:          5.7%-6.4%  Diabetes:              >6.4%  Glycemic control for   <7.0% adults with diabetes     CBG: Recent Labs  Lab 11/15/20 1929 11/15/20 2334 11/16/20 0343 11/16/20 0714  11/16/20  1103  GLUCAP 143* 193* 204* 175* 182*     Review of Systems:   Unable to obtain due to patient status.  Past Medical History:  He,  has a past medical history of Alcoholism (HCC), Gout, Hypertension, Pancreatitis, and Tobacco abuse.   Surgical History:   Past Surgical History:  Procedure Laterality Date   LAPAROSCOPY N/A 11/10/2020   Procedure: LAPAROSCOPY DIAGNOSTIC;  Surgeon: Sheliah Hatch De Blanch, MD;  Location: [facility] OR;  Service: General;  Laterality: N/A;   LAPAROTOMY N/A 11/10/2020   Procedure: EXPLORATORY LAPAROTOMY AND PANCREATIC DEBRIDEMENT;  Surgeon: Sheliah Hatch, De Blanch, MD;  Location: [facility] OR;  Service: General;  Laterality: N/A;     Social History:   reports that he has been smoking cigarettes. He does not have any smokeless tobacco history on file. He reports that he does not currently use alcohol.   Family History:  His family history includes Diverticulitis in his mother; Liver cancer in his maternal aunt.   Allergies No Known Allergies   Home Medications  Prior to Admission medications   Medication Sig Start Date End Date Taking? Authorizing Provider  acetaminophen (TYLENOL) 500 MG tablet Take 500 mg by mouth every 6 (six) hours as needed for mild pain.   Yes [provider]  albuterol (VENTOLIN HFA) 108 (90 Base) MCG/ACT inhaler Inhale 1-2 puffs into the lungs every 6 (six) hours as needed for wheezing or shortness of breath.   Yes [provider]  allopurinol (ZYLOPRIM) 300 MG tablet Take 300 mg by mouth daily.   Yes [provider]  ALPRAZolam Prudy Feeler) 0.5 MG tablet Take 0.5 mg by mouth 3 (three) times daily as needed for anxiety.   Yes [provider]  amLODipine (NORVASC) 5 MG tablet Take 5 mg by mouth daily.   Yes [provider]  Fluticasone-Umeclidin-Vilant (TRELEGY ELLIPTA) 100-62.5-25 MCG/INH AEPB Inhale 1 puff into the lungs daily.   Yes [provider]  folic acid (FOLVITE) 1 MG tablet Take 1  mg by mouth daily.   Yes [provider]  furosemide (LASIX) 40 MG tablet Take 40 mg by mouth 2 (two) times daily.   Yes [provider]  Multiple Vitamins-Minerals (PRESERVISION AREDS 2+MULTI VIT PO) Take 2 tablets by mouth daily.   Yes [provider]  Omega-3 Krill Oil 500 MG CAPS Take 1,000 mg by mouth daily.   Yes [provider]  omeprazole (PRILOSEC) 40 MG capsule Take 40 mg by mouth daily.   Yes [provider]  oxyCODONE (OXY IR/ROXICODONE) 5 MG immediate release tablet Take 2.5-5 mg by mouth every 6 (six) hours as needed for severe pain.   Yes [provider]  thiamine (VITAMIN B-1) 100 MG tablet Take 100 mg by mouth daily.   Yes [provider]      Marolyn Haller, MD PGY-2 Internal Medicine  Pager [phone]   Critical care time: 35 minutes

## 2020-11-16 NOTE — Progress Notes (Signed)
OT Cancellation Note  Patient Details Name: Paul Chambers MRN: [id number] DOB: 09/19/1965   Cancelled Treatment:    Reason Eval/Treat Not Completed: Patient not medically ready RN requesting to hold therapy this date. Pt clinically worsened some overnight and recently returned from CT. Awaiting CT results. Will return as time allows and pt is appropriate.   [facility] OTR/L Acute Rehabilitation Services Office: [phone]  Rebeca Alert 11/16/2020, 2:43 PM

## 2020-11-16 NOTE — Progress Notes (Signed)
Progress Note  26 Days Post-Op  Subjective: Patient remains alert on the vent. Low grade fevers overnight. Abdomen distended but having stool output. No vomiting. Wife at bedside this AM and I answered her questions.   Objective: Vital signs in last 24 hours: Temp:  [97.7 F (36.5 C)-101.7 F (38.7 C)] 100.1 F (37.8 C) (10/04 0715) Pulse Rate:  [67-108] 100 (10/04 0830) Resp:  [18-52] 27 (10/04 0830) BP: (91-128)/(54-81) 113/65 (10/04 0830) SpO2:  [91 %-100 %] 99 % (10/04 0830) FiO2 (%):  [40 %-60 %] (P) 60 % (10/04 0800) Last BM Date: 11/16/20  Intake/Output from previous day: 10/03 0701 - 10/04 0700 In: 3263.5 [I.V.:739.7; NG/GT:1330; IV Piggyback:1163.9] Out: 2270 [Urine:1650; Drains:420; Stool:200] Intake/Output this shift: No intake/output data recorded.  PE: Gen:  Agitated on the vent Card:  sinus tachycardia in the low 100s Pulm:  CTAB, mechanically ventilated Abd: distended, IR drain x2 on the right and IR drain x1 on the left draining purulent fluid, drainage pouch over previous surgical drain sites with minimal purulent fluid, midline wound clean with granulation tissue present; rectal pouch in place with watery stool    Lab Results:  Recent Labs    11/15/20 1019 11/16/20 0339 11/16/20 0412  WBC 16.4* 17.9*  --   HGB 8.0* 7.7* 8.2*  HCT 26.9* 26.5* 24.0*  PLT 263 247  --    BMET Recent Labs    11/15/20 0455 11/16/20 0339 11/16/20 0412  NA 142 140 143  K 3.7 4.0 3.9  CL 106 103  --   CO2 29 27  --   GLUCOSE 148* 208*  --   BUN 15 17  --   CREATININE 0.58* 0.68  --   CALCIUM 8.3* 8.1*  --    PT/INR No results for input(s): LABPROT, INR in the last 72 hours. CMP     Component Value Date/Time   NA 143 11/16/2020 0412   K 3.9 11/16/2020 0412   CL 103 11/16/2020 0339   CO2 27 11/16/2020 0339   GLUCOSE 208 (H) 11/16/2020 0339   BUN 17 11/16/2020 0339   CREATININE 0.68 11/16/2020 0339   CALCIUM 8.1 (L) 11/16/2020 0339   PROT 6.0 (L)  11/15/2020 0455   ALBUMIN 1.7 (L) 11/15/2020 0455   AST 19 11/15/2020 0455   ALT 15 11/15/2020 0455   ALKPHOS 73 11/15/2020 0455   BILITOT 0.6 11/15/2020 0455   GFRNONAA >60 11/16/2020 0339   Lipase     Component Value Date/Time   LIPASE 29 11/13/2020 0054       Studies/Results: DG CHEST PORT 1 VIEW  Result Date: 11/16/2020 CLINICAL DATA:  Respiratory failure EXAM: PORTABLE CHEST 1 VIEW COMPARISON:  11/14/2020 FINDINGS: Endotracheal tube and feeding catheter are noted in satisfactory position. Cardiac shadow is stable. Persistent right basilar airspace opacity is noted although the right-sided effusion is decreased when compare with the prior exam secondary to drainage catheter. Small left-sided pleural effusion is noted. IMPRESSION: Improved aeration in the right lung base although persistent opacity is seen. Bilateral small effusions improved on the right. Electronically Signed   By: Alcide Clever M.D.   On: 11/16/2020 03:46    Anti-infectives: Anti-infectives (From admission, onward)    Start     Dose/Rate Route Frequency Ordered Stop   11/15/20 1015  linezolid (ZYVOX) IVPB 600 mg        600 mg 300 mL/hr over 60 Minutes Intravenous Every 12 hours 11/15/20 0919     11/14/20 1200  vancomycin (VANCOREADY) IVPB 750 mg/150 mL  Status:  Discontinued        750 mg 150 mL/hr over 60 Minutes Intravenous Every 8 hours 11/14/20 0251 11/15/20 0919   11/14/20 0330  vancomycin (VANCOREADY) IVPB 2000 mg/400 mL        2,000 mg 200 mL/hr over 120 Minutes Intravenous  Once 11/14/20 0251 11/14/20 0519   11/12/20 1900  piperacillin-tazobactam (ZOSYN) IVPB 3.375 g        3.375 g 12.5 mL/hr over 240 Minutes Intravenous Every 8 hours 11/12/20 1538     11/09/20 1730  Ampicillin-Sulbactam (UNASYN) 3 g in sodium chloride 0.9 % 100 mL IVPB  Status:  Discontinued        3 g 200 mL/hr over 30 Minutes Intravenous Every 6 hours 11/09/20 1617 11/12/20 1538   11/06/20 1545  piperacillin-tazobactam (ZOSYN)  IVPB 3.375 g  Status:  Discontinued        3.375 g 12.5 mL/hr over 240 Minutes Intravenous Every 8 hours 11/06/20 1453 11/09/20 1616   11/01/20 0900  levofloxacin (LEVAQUIN) IVPB 750 mg  Status:  Discontinued        750 mg 100 mL/hr over 90 Minutes Intravenous Every 24 hours 11/01/20 0824 11/06/20 1408   11/01/20 0900  metroNIDAZOLE (FLAGYL) IVPB 500 mg  Status:  Discontinued        500 mg 100 mL/hr over 60 Minutes Intravenous Every 12 hours 11/01/20 0824 11/06/20 1408   10/29/20 2200  meropenem (MERREM) 1 g in sodium chloride 0.9 % 100 mL IVPB  Status:  Discontinued        1 g 200 mL/hr over 30 Minutes Intravenous Every 8 hours 10/29/20 1428 11/01/20 0824   10/28/20 1615  meropenem (MERREM) 2 g in sodium chloride 0.9 % 100 mL IVPB  Status:  Discontinued        2 g 200 mL/hr over 30 Minutes Intravenous Every 8 hours 10/28/20 1515 10/29/20 1428   10/28/20 1445  metroNIDAZOLE (FLAGYL) IVPB 500 mg  Status:  Discontinued        500 mg 100 mL/hr over 60 Minutes Intravenous Every 12 hours 10/28/20 1348 10/28/20 1505   10/25/20 2300  fluconazole (DIFLUCAN) IVPB 400 mg        400 mg 100 mL/hr over 120 Minutes Intravenous Every 24 hours 10/25/20 0742 10/29/20 0718   10/24/20 2300  fluconazole (DIFLUCAN) IVPB 200 mg  Status:  Discontinued        200 mg 100 mL/hr over 60 Minutes Intravenous Every 24 hours 10/24/20 0943 10/25/20 0742   10/22/20 1800  vancomycin (VANCOREADY) IVPB 1500 mg/300 mL  Status:  Discontinued        1,500 mg 150 mL/hr over 120 Minutes Intravenous Every 24 hours 10/22/2020 2225 10/22/20 0759   10/22/20 0900  linezolid (ZYVOX) IVPB 600 mg  Status:  Discontinued        600 mg 300 mL/hr over 60 Minutes Intravenous Every 12 hours 10/22/20 0800 10/27/20 0852   10/22/20 0300  piperacillin-tazobactam (ZOSYN) IVPB 3.375 g  Status:  Discontinued        3.375 g 12.5 mL/hr over 240 Minutes Intravenous Every 8 hours 10/29/2020 2225 10/28/20 1514   11/01/2020 2335  metroNIDAZOLE (FLAGYL)  IVPB 500 mg  Status:  Discontinued        500 mg 100 mL/hr over 60 Minutes Intravenous Every 12 hours 10/29/2020 2142 10/22/20 0747   10/17/2020 2248  fluconazole (DIFLUCAN) IVPB 400 mg  Status:  Discontinued  400 mg 100 mL/hr over 120 Minutes Intravenous Every 24 hours 11-04-20 2142 10/24/20 0943   04-Nov-2020 1748  vancomycin (VANCOREADY) IVPB 2000 mg/400 mL        2,000 mg 200 mL/hr over 120 Minutes Intravenous  Once 11-04-20 1706 Nov 04, 2020 2113   11/04/20 1715  piperacillin-tazobactam (ZOSYN) IVPB 3.375 g        3.375 g 100 mL/hr over 30 Minutes Intravenous  Once 11-04-20 1706 2020-11-04 1903        Assessment/Plan POD 24 s/p ex lap with pancreatic debridement for infected necrotic pancreatitis by Dr. Sheliah Hatch on 9/9 - Superior drain under the mesocolon along with the head of the pancreas - Inferior drain under the mesocolon along the left upper abdomen and what is likely a position inferior to the pancreas - CT 9/24 w/ gas and fluid collections that are grossly stable in the retroperitoneum and are drained by JP tubes. Cont drains - CT 9/24 also with mildly increased amount of fluid is noted around the liver with an increased amount of air seen in the non dependent portion concerning for persistent bowel leak. - pt is having bowel function and IR drains placed 9/26 do not appear feculent. Low clinical suspicion for bowel leak - s/p IR drains 9/26. Cx with enterococcus faecalis, sensitivities pending  - BID WTD - Patient pulled out surgical drain x2 on 10/1, he still has IR drain x3 in place. These appear to be in good position and are all putting out purulent drainage - CT 10/1 air-fluid collections throughout the abdomen and pelvis have not significantly changed in size or distribution; New wall thickening of the splenic flexure of the colon worrisome for reactive colitis - Previous drain sites leaking -  agree with drainage pouch.  - patient clinically worsened some overnight, agree  with repeat CT AP with PO and IV contrast  - may need IR to consider placement of more drains or upsizing existing drains. Surgical intervention at this point would be very high risk. We will follow very closely but prognosis seems guarded.    FEN: NPO, hold TF ID: currently on zosyn and linezolid VTE: SCDs, heparin subq Foley - placed 10/2   - Per primary -  VDRF - reintubated 10/2,  PEA arrest post-intubation  Septic shock, possible aspiration B/l pleural effusions Abl anemia  ETOH use  AKI T2DM  LOS: 26 days    Juliet Rude, [facility] 11/16/2020, 9:55 AM Please see Amion for pager number during day hours 7:00am-4:30pm

## 2020-11-17 ENCOUNTER — Inpatient Hospital Stay (HOSPITAL_COMMUNITY): Payer: BC Managed Care – PPO

## 2020-11-17 DIAGNOSIS — K8591 Acute pancreatitis with uninfected necrosis, unspecified: Secondary | ICD-10-CM | POA: Diagnosis not present

## 2020-11-17 HISTORY — PX: IR CATHETER TUBE CHANGE: IMG717

## 2020-11-17 LAB — VITAMIN B12: Vitamin B-12: 3817 pg/mL — ABNORMAL HIGH (ref 180–914)

## 2020-11-17 LAB — CBC
HCT: 27.1 % — ABNORMAL LOW (ref 39.0–52.0)
Hemoglobin: 8.3 g/dL — ABNORMAL LOW (ref 13.0–17.0)
MCH: 31.1 pg (ref 26.0–34.0)
MCHC: 30.6 g/dL (ref 30.0–36.0)
MCV: 101.5 fL — ABNORMAL HIGH (ref 80.0–100.0)
Platelets: 231 10*3/uL (ref 150–400)
RBC: 2.67 MIL/uL — ABNORMAL LOW (ref 4.22–5.81)
RDW: 18.7 % — ABNORMAL HIGH (ref 11.5–15.5)
WBC: 17.8 10*3/uL — ABNORMAL HIGH (ref 4.0–10.5)
nRBC: 0 % (ref 0.0–0.2)

## 2020-11-17 LAB — BASIC METABOLIC PANEL
Anion gap: 8 (ref 5–15)
BUN: 16 mg/dL (ref 6–20)
CO2: 27 mmol/L (ref 22–32)
Calcium: 8 mg/dL — ABNORMAL LOW (ref 8.9–10.3)
Chloride: 103 mmol/L (ref 98–111)
Creatinine, Ser: 0.67 mg/dL (ref 0.61–1.24)
GFR, Estimated: >60 mL/min (ref >60)
Glucose, Bld: 136 mg/dL — ABNORMAL HIGH (ref 70–99)
Potassium: 3.6 mmol/L (ref 3.5–5.1)
Sodium: 138 mmol/L (ref 135–145)

## 2020-11-17 LAB — GLUCOSE, CAPILLARY
Glucose-Capillary: 120 mg/dL — ABNORMAL HIGH (ref 70–99)
Glucose-Capillary: 123 mg/dL — ABNORMAL HIGH (ref 70–99)
Glucose-Capillary: 129 mg/dL — ABNORMAL HIGH (ref 70–99)
Glucose-Capillary: 132 mg/dL — ABNORMAL HIGH (ref 70–99)
Glucose-Capillary: 133 mg/dL — ABNORMAL HIGH (ref 70–99)
Glucose-Capillary: 152 mg/dL — ABNORMAL HIGH (ref 70–99)

## 2020-11-17 LAB — MAGNESIUM: Magnesium: 2 mg/dL (ref 1.7–2.4)

## 2020-11-17 MED ORDER — HEPARIN SODIUM (PORCINE) 5000 UNIT/ML IJ SOLN
5000.0000 [IU] | Freq: Three times a day (TID) | INTRAMUSCULAR | Status: DC
  Administered 2020-11-18 – 2020-11-27 (×27): 5000 [IU] via SUBCUTANEOUS
  Filled 2020-11-17 (×27): qty 1

## 2020-11-17 MED ORDER — MIDAZOLAM HCL 2 MG/2ML IJ SOLN
INTRAMUSCULAR | Status: AC
  Filled 2020-11-17: qty 2

## 2020-11-17 MED ORDER — FENTANYL CITRATE (PF) 100 MCG/2ML IJ SOLN
INTRAMUSCULAR | Status: AC
  Filled 2020-11-17: qty 2

## 2020-11-17 MED ORDER — LIDOCAINE HCL 1 % IJ SOLN
INTRAMUSCULAR | Status: AC
  Filled 2020-11-17: qty 20

## 2020-11-17 MED ORDER — FOLIC ACID 5 MG/ML IJ SOLN
1.0000 mg | Freq: Every day | INTRAMUSCULAR | Status: DC
  Administered 2020-11-18: 1 mg via INTRAVENOUS
  Filled 2020-11-17: qty 0.2

## 2020-11-17 MED ORDER — DEXMEDETOMIDINE HCL IN NACL 400 MCG/100ML IV SOLN
0.0000 ug/kg/h | INTRAVENOUS | Status: DC
  Administered 2020-11-17 – 2020-11-20 (×21): 1.2 ug/kg/h via INTRAVENOUS
  Filled 2020-11-17: qty 200
  Filled 2020-11-17 (×2): qty 100
  Filled 2020-11-17 (×2): qty 200
  Filled 2020-11-17 (×2): qty 100
  Filled 2020-11-17: qty 200
  Filled 2020-11-17 (×11): qty 100

## 2020-11-17 MED ORDER — THIAMINE HCL 100 MG/ML IJ SOLN
100.0000 mg | Freq: Every day | INTRAMUSCULAR | Status: DC
  Administered 2020-11-18: 100 mg via INTRAVENOUS
  Filled 2020-11-17: qty 2

## 2020-11-17 MED ORDER — IOHEXOL 350 MG/ML SOLN
50.0000 mL | Freq: Once | INTRAVENOUS | Status: AC | PRN
  Administered 2020-11-17: 20 mL

## 2020-11-17 MED ORDER — MIDAZOLAM HCL 2 MG/2ML IJ SOLN
INTRAMUSCULAR | Status: DC | PRN
  Administered 2020-11-17 (×3): 1 mg via INTRAVENOUS

## 2020-11-17 MED ORDER — FENTANYL CITRATE (PF) 100 MCG/2ML IJ SOLN
INTRAMUSCULAR | Status: DC | PRN
  Administered 2020-11-17 (×3): 50 ug via INTRAVENOUS

## 2020-11-17 NOTE — Progress Notes (Signed)
IR called. This RN flushed left lower drain with 57mL NS instead of 61mL NS that is ordered. Verbal okay from IR as long as accurate I/O is documented in the chart. Correct documentation verified by this RN.

## 2020-11-17 NOTE — Progress Notes (Signed)
PT Cancellation Note  Patient Details Name: Paul Chambers MRN: [id number] DOB: [birth year]   Cancelled Treatment:    Reason Eval/Treat Not Completed: Medical issues which prohibited therapy.  Pt having trouble synchronizing his breathing to the ventilator.  No ready for therapies today. 11/17/2020  Jacinto Halim., PT Acute Rehabilitation Services [phone]  (pager) [phone]  (office)   [name] 11/17/2020, 2:13 PM

## 2020-11-17 NOTE — Progress Notes (Signed)
OT Cancellation Note  Patient Details Name: Paul Chambers MRN: [id number] DOB: 1965-09-18   Cancelled Treatment:    Reason Eval/Treat Not Completed: Patient not medically ready Pt had IR procedure earlier this date to exchange and upsize RLQ and LLQ abscess drains. Pt is back on unit, per RN, pt with difficulty with synchronous ventilation. Will hold therapy at this time. OT will return as time allows and pt is appropriate.   [facility] OTR/L Acute Rehabilitation Services Office: [phone]   Rebeca Alert 11/17/2020, 2:03 PM

## 2020-11-17 NOTE — Procedures (Signed)
Interventional Radiology Procedure Note  Procedure: Exchange and upsize of RLQ and LLQ abscess drains.  Indication: Persistent/enlarging abscesses  Findings: Please refer to procedural dictation for full description.  Complications: None  EBL: < 10 mL  Acquanetta Belling, MD [phone]

## 2020-11-17 NOTE — Progress Notes (Addendum)
Referring Physician(s): Dr Chevis Pretty  Supervising Physician: Mir, Mauri Reading  Patient Status:  [facility] - In-pt  Chief Complaint:  Necrotizing pancreatitis   Subjective:  3 drains placed in IR 9/26 in IR Minimal OP Rising wbc; fever  CT yesterday: IMPRESSION: Paul Chambers colonic communication along the transverse colon into the mesocolic portion of the necrotic collection with extensive debris and fluid throughout the retroperitoneum in the setting of necrotic pancreatitis. Sequela of necrotic pancreatitis extends into the RIGHT upper quadrant about the liver, below the LEFT hemidiaphragm and throughout the LEFT and RIGHT retroperitoneum into the pelvis on the LEFT greater than RIGHT.     Numerous drains in place still with considerable debris in fluid in the abdomen though with diminished amount of fluid and gas along the liver when compared to previous imaging       Persistent basilar airspace disease and effusions LEFT worse than RIGHT. Consider the possibility of developing infection at the LEFT lung base.      Tract extending through the abdominal wall on the RIGHT in the mid abdomen communicates with necrotic debris in the transverse mesocolon.      Signs of diffuse peritoneal inflammation and omental stranding.      Bowel wall thickening throughout small bowel in the abdomen likely related to diffuse enteritis in the setting diffuse peritoneal inflammation.  Request made for upsize of existing 3 drains and possible new drain placement per [facility]  Allergies: Patient has no known allergies.  Medications: Prior to Admission medications   Medication Sig Start Date End Date Taking? Authorizing Provider  acetaminophen (TYLENOL) 500 MG tablet Take 500 mg by mouth every 6 (six) hours as needed for mild pain.   Yes [provider]  albuterol (VENTOLIN HFA) 108 (90 Base) MCG/ACT inhaler Inhale 1-2 puffs into the lungs every 6 (six) hours as needed for wheezing or  shortness of breath.   Yes [provider]  allopurinol (ZYLOPRIM) 300 MG tablet Take 300 mg by mouth daily.   Yes [provider]  ALPRAZolam Prudy Feeler) 0.5 MG tablet Take 0.5 mg by mouth 3 (three) times daily as needed for anxiety.   Yes [provider]  amLODipine (NORVASC) 5 MG tablet Take 5 mg by mouth daily.   Yes [provider]  Fluticasone-Umeclidin-Vilant (TRELEGY ELLIPTA) 100-62.5-25 MCG/INH AEPB Inhale 1 puff into the lungs daily.   Yes [provider]  folic acid (FOLVITE) 1 MG tablet Take 1 mg by mouth daily.   Yes [provider]  furosemide (LASIX) 40 MG tablet Take 40 mg by mouth 2 (two) times daily.   Yes [provider]  Multiple Vitamins-Minerals (PRESERVISION AREDS 2+MULTI VIT PO) Take 2 tablets by mouth daily.   Yes [provider]  Omega-3 Krill Oil 500 MG CAPS Take 1,000 mg by mouth daily.   Yes [provider]  omeprazole (PRILOSEC) 40 MG capsule Take 40 mg by mouth daily.   Yes [provider]  oxyCODONE (OXY IR/ROXICODONE) 5 MG immediate release tablet Take 2.5-5 mg by mouth every 6 (six) hours as needed for severe pain.   Yes [provider]  thiamine (VITAMIN B-1) 100 MG tablet Take 100 mg by mouth daily.   Yes [provider]     Vital Signs: BP 130/79   Pulse 61   Temp 100.2 F (37.9 C) (Core)   Resp (!) 30   Ht 5\' 9"  (1.753 m)   Wt 224 lb 10.4 oz (101.9 kg)  SpO2 100%   BMI 33.17 kg/m   Physical Exam Vitals reviewed.  Constitutional:      Comments: Intubated No response  Cardiovascular:     Rate and Rhythm: Regular rhythm.  Pulmonary:     Comments: vent Skin:    General: Skin is warm.  Psychiatric:     Comments: Spoke to Wife [place] via phone Consented for IR procedure    Imaging: CT CHEST W CONTRAST  Result Date: 11/13/2020 CLINICAL DATA:  Pneumonia. Evaluate for abdominal infection. History of necrotizing pancreatitis. EXAM: CT CHEST,  ABDOMEN, AND PELVIS WITH CONTRAST TECHNIQUE: Multidetector CT imaging of the chest, abdomen and pelvis was performed following the standard protocol during bolus administration of intravenous contrast. CONTRAST:  OMNIPAQUE IOHEXOL 300 MG/ML  SOLN COMPARISON:  CT abdomen and pelvis 11/06/2020. FINDINGS: CT CHEST FINDINGS Cardiovascular: The heart is enlarged. There is no pericardial effusion. The aorta is normal in size. The main pulmonary artery is enlarged compatible with pulmonary artery hypertension. Right-sided central venous catheter tip ends at the caval atrial junction. Mediastinum/Nodes: Visualized thyroid gland is within normal limits. There are no enlarged mediastinal or hilar lymph nodes. Enteric tube is seen throughout nondilated esophagus. Lungs/Pleura: There are large bilateral pleural effusions, left greater than right. There is compressive atelectasis of the bilateral lower lobes. There is also some atelectasis of the bilateral upper lobes. The lungs are otherwise clear. Trachea and central airways appear patent. There is no pneumothorax. Musculoskeletal: No chest wall mass or suspicious bone lesions identified. CT ABDOMEN PELVIS FINDINGS Hepatobiliary: No focal liver lesions are identified. There is gallbladder wall edema, unchanged. The gallbladder is nondilated. There is no biliary ductal dilatation. Pancreas: Head and neck of the pancreas are within normal limits. 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 Extensive peripancreatic air-fluid collection is again noted extending into the mid abdomen tracking along the body and tail of the pancreas. Right-sided percutaneous drainage catheters have been removed in the interval. There is a new left-sided pigtail percutaneous catheter within the collection. Compared to the prior study this collection has not significantly changed in size. The largest areas near the pancreatic tail measuring 4.7 x 12.0 cm (previously  12.7 x 4.8 cm). Spleen: Normal in size without focal abnormality. Adrenals/Urinary Tract: Adrenal glands are unremarkable. Kidneys are normal, without renal calculi, focal lesion, or hydronephrosis. Bladder is unremarkable. Stomach/Bowel: There is no evidence for bowel obstruction. The appendix is not seen. There is sigmoid colon diverticulosis without evidence for diverticulitis. There is new wall thickening and inflammation involving the splenic flexure of the colon. This portion of the colon abuts the. Pancreatic air-fluid collection. If fistula would be difficult to exclude. There is also diffuse wall thickening of small bowel loops with associated mesenteric edema similar to the prior study. Gastrojejunostomy tube is present. Stomach is decompressed. Rectal catheter is present. Vascular/Lymphatic: Aorta and IVC are normal in size. There are atherosclerotic calcifications of the aorta. Reproductive: Prostate is unremarkable. Other: Perihepatic air-fluid collection extending into the right paracolic gutter is unchanged in size and appearance. There is a new percutaneous pigtail catheter in the collection adjacent to the liver. Small amount of ascites is decreased, but there is stable diffuse omental edema. Air-fluid collection abutting the proximal stomach and left adrenal gland appears unchanged measuring 8.3 x 3.0 cm image 3/60. Bilateral lower retroperitoneal air-fluid collections abutting the iliopsoas muscles also appear unchanged. The largest is on the left measuring 4.9 x 5.3 cm image 3/99. Presacral edema  is present. No new enhancing fluid collections are identified. Musculoskeletal: There is diffuse body wall edema similar to the prior study. No acute fractures. No focal osseous lesion. IMPRESSION: 1. Large bilateral pleural effusions. 2. Atelectasis of the bilateral lower lobes and upper lobes. 3. Cardiomegaly. Enlarged main pulmonary artery compatible with pulmonary artery hypertension. 4. Overall,  air-fluid collections throughout the abdomen and pelvis have not significantly changed in size or distribution. Right-sided percutaneous catheters have been removed in the interval. There is a new left-sided pigtail catheter ending in the peripancreatic collection and a new right-sided pigtail catheter ending in perihepatic collection. 5. New wall thickening of the splenic flexure of the colon worrisome for reactive colitis. Note is made that this section of the colon abuts air-fluid collection near the pancreas in fistula would be difficult to exclude. 6. Diffuse wall thickening of small bowel with mesenteric edema worrisome for nonspecific enteritis. Gastrojejunostomy tube in place. 7. Stable gallbladder wall edema, possibly reactive. Correlate for acute cholecystitis. Electronically Signed   By: Darliss Cheney M.D.   On: 11/13/2020 21:58   CT ABDOMEN PELVIS W CONTRAST  Result Date: 11/16/2020 CLINICAL DATA:  A 56 year old male with signs of sepsis and bowel perforation suspected. EXAM: CT ABDOMEN AND PELVIS WITH CONTRAST TECHNIQUE: Multidetector CT imaging of the abdomen and pelvis was performed using the standard protocol following bolus administration of intravenous contrast. CONTRAST:  80mL OMNIPAQUE IOHEXOL 350 MG/ML SOLN COMPARISON:  Multiple prior studies most recent comparison from November 13, 2020. FINDINGS: Lower chest: Catheter for venous access terminates at the caval to atrial junction. The central pulmonary vasculature with engorgement of main pulmonary artery to 3.8 cm, partially visualized. Dense basilar consolidation and LEFT-sided effusion is similar to the recent CT of the chest, abdomen and pelvis as is a RIGHT effusion with RIGHT basilar airspace disease. Hepatobiliary: Perihepatic fluid diminished since previous imaging. Drain remains in place entering via RIGHT flank approach between ribs 8 and 9. No focal, suspicious hepatic lesion. Gallbladder is contracted. No gross biliary duct  distension. Pancreas: Signs of pancreatic necrosis with large necrotic collection in the anterior pararenal space tracking into the transverse mesocolon where there is evidence of frank colonic communication best seen on sagittal image 108/7. A small amount of ingested contrast media is seen in the dependent aspect of the necrotic collection in this location. This communicates with the perihepatic fluid as well and with necrotic debris in the LEFT retroperitoneum. A drain entering via LEFT flank approach terminates in the anterior pararenal space in the LEFT retroperitoneum. Collection in the LEFT retroperitoneum measuring 11.1 x 5.0 cm previously 12.0 x 4.7 cm. Collection in the transverse mesocolon 5.6 x 4.3 cm (image 50/3) previously 6.2 x 4.4 cm. Drain in the RIGHT anterior pararenal space is similar. Lower extension of LEFT flank fluid into the LEFT iliac fossa measures 4.9 x 4.2 cm as compared to 5.3 x 4.9 cm (image 69/3) Signs of diffuse peritoneal inflammation and omental stranding. Perienteric fluid tracking in the anterior abdomen. Tract extending through the abdominal wall on the RIGHT in the mid abdomen communicates with necrotic debris in the transverse mesocolon. Spleen: Unremarkable currently. Adrenals/Urinary Tract: Adrenal glands normal. Symmetric renal enhancement. No hydronephrosis. Collapsed urinary bladder. Stomach/Bowel: Feeding tube in the proximal jejunum beyond the ligament of Treitz. Signs of mesenteric inflammation. Bowel wall thickening throughout the small bowel in the abdomen. Colonic communication with walled-off necrosis in the retroperitoneum and pancreatic bed. Vascular/Lymphatic: Aortic atherosclerosis of the abdominal aorta without aneurysm. Smooth contour  of the IVC. Narrowing of the splenic vein as it passes through the necrotic collection in the retroperitoneum. There is no gastrohepatic or hepatoduodenal ligament lymphadenopathy. No retroperitoneal or mesenteric  lymphadenopathy. No pelvic sidewall lymphadenopathy. Reproductive: Unremarkable. Other: Diffuse inflammation throughout the abdomen. Ascites and interloop fluid with similar volume. Fluid about the liver is diminished in volume compared to the previous study, drain remaining in place, other surgical drains and collections as outlined above. There is also stranding and fat necrosis as well as inflammation tracking into the RIGHT iliac fossa less than on the LEFT. Musculoskeletal: No acute bone finding. No destructive bone process. Spinal degenerative changes. IMPRESSION: Paul Chambers colonic communication along the transverse colon into the mesocolic portion of the necrotic collection with extensive debris and fluid throughout the retroperitoneum in the setting of necrotic pancreatitis. Sequela of necrotic pancreatitis extends into the RIGHT upper quadrant about the liver, below the LEFT hemidiaphragm and throughout the LEFT and RIGHT retroperitoneum into the pelvis on the LEFT greater than RIGHT. Numerous drains in place still with considerable debris in fluid in the abdomen though with diminished amount of fluid and gas along the liver when compared to previous imaging. Persistent basilar airspace disease and effusions LEFT worse than RIGHT. Consider the possibility of developing infection at the LEFT lung base. Tract extending through the abdominal wall on the RIGHT in the mid abdomen communicates with necrotic debris in the transverse mesocolon. Signs of diffuse peritoneal inflammation and omental stranding. Bowel wall thickening throughout small bowel in the abdomen likely related to diffuse enteritis in the setting diffuse peritoneal inflammation. Findings of bowel communication with necrotic collection were called to the provider caring for the patient at the time of dictation. These results were called by telephone at the time of interpretation on 11/16/2020 at 2:23 pm to provider [facility] , who verbally  acknowledged these results. Electronically Signed   By: Donzetta Kohut M.D.   On: 11/16/2020 14:24   CT ABDOMEN PELVIS W CONTRAST  Result Date: 11/13/2020 CLINICAL DATA:  Pneumonia. Evaluate for abdominal infection. History of necrotizing pancreatitis. EXAM: CT CHEST, ABDOMEN, AND PELVIS WITH CONTRAST TECHNIQUE: Multidetector CT imaging of the chest, abdomen and pelvis was performed following the standard protocol during bolus administration of intravenous contrast. CONTRAST:  OMNIPAQUE IOHEXOL 300 MG/ML  SOLN COMPARISON:  CT abdomen and pelvis 11/06/2020. FINDINGS: CT CHEST FINDINGS Cardiovascular: The heart is enlarged. There is no pericardial effusion. The aorta is normal in size. The main pulmonary artery is enlarged compatible with pulmonary artery hypertension. Right-sided central venous catheter tip ends at the caval atrial junction. Mediastinum/Nodes: Visualized thyroid gland is within normal limits. There are no enlarged mediastinal or hilar lymph nodes. Enteric tube is seen throughout nondilated esophagus. Lungs/Pleura: There are large bilateral pleural effusions, left greater than right. There is compressive atelectasis of the bilateral lower lobes. There is also some atelectasis of the bilateral upper lobes. The lungs are otherwise clear. Trachea and central airways appear patent. There is no pneumothorax. Musculoskeletal: No chest wall mass or suspicious bone lesions identified. CT ABDOMEN PELVIS FINDINGS Hepatobiliary: No focal liver lesions are identified. There is gallbladder wall edema, unchanged. The gallbladder is nondilated. There is no biliary ductal dilatation. Pancreas: Head and neck of the pancreas are within normal limits. 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 Extensive peripancreatic air-fluid collection is again noted extending into the mid abdomen tracking along the body and tail of the pancreas. Right-sided percutaneous drainage catheters  have been removed in the interval. There is a new left-sided pigtail percutaneous catheter within the collection. Compared to the prior study this collection has not significantly changed in size. The largest areas near the pancreatic tail measuring 4.7 x 12.0 cm (previously 12.7 x 4.8 cm). Spleen: Normal in size without focal abnormality. Adrenals/Urinary Tract: Adrenal glands are unremarkable. Kidneys are normal, without renal calculi, focal lesion, or hydronephrosis. Bladder is unremarkable. Stomach/Bowel: There is no evidence for bowel obstruction. The appendix is not seen. There is sigmoid colon diverticulosis without evidence for diverticulitis. There is new wall thickening and inflammation involving the splenic flexure of the colon. This portion of the colon abuts the. Pancreatic air-fluid collection. If fistula would be difficult to exclude. There is also diffuse wall thickening of small bowel loops with associated mesenteric edema similar to the prior study. Gastrojejunostomy tube is present. Stomach is decompressed. Rectal catheter is present. Vascular/Lymphatic: Aorta and IVC are normal in size. There are atherosclerotic calcifications of the aorta. Reproductive: Prostate is unremarkable. Other: Perihepatic air-fluid collection extending into the right paracolic gutter is unchanged in size and appearance. There is a new percutaneous pigtail catheter in the collection adjacent to the liver. Small amount of ascites is decreased, but there is stable diffuse omental edema. Air-fluid collection abutting the proximal stomach and left adrenal gland appears unchanged measuring 8.3 x 3.0 cm image 3/60. Bilateral lower retroperitoneal air-fluid collections abutting the iliopsoas muscles also appear unchanged. The largest is on the left measuring 4.9 x 5.3 cm image 3/99. Presacral edema is present. No new enhancing fluid collections are identified. Musculoskeletal: There is diffuse body wall edema similar to the  prior study. No acute fractures. No focal osseous lesion. IMPRESSION: 1. Large bilateral pleural effusions. 2. Atelectasis of the bilateral lower lobes and upper lobes. 3. Cardiomegaly. Enlarged main pulmonary artery compatible with pulmonary artery hypertension. 4. Overall, air-fluid collections throughout the abdomen and pelvis have not significantly changed in size or distribution. Right-sided percutaneous catheters have been removed in the interval. There is a new left-sided pigtail catheter ending in the peripancreatic collection and a new right-sided pigtail catheter ending in perihepatic collection. 5. New wall thickening of the splenic flexure of the colon worrisome for reactive colitis. Note is made that this section of the colon abuts air-fluid collection near the pancreas in fistula would be difficult to exclude. 6. Diffuse wall thickening of small bowel with mesenteric edema worrisome for nonspecific enteritis. Gastrojejunostomy tube in place. 7. Stable gallbladder wall edema, possibly reactive. Correlate for acute cholecystitis. Electronically Signed   By: Darliss Cheney M.D.   On: 11/13/2020 21:58   DG CHEST PORT 1 VIEW  Result Date: 11/16/2020 CLINICAL DATA:  Respiratory failure EXAM: PORTABLE CHEST 1 VIEW COMPARISON:  11/14/2020 FINDINGS: Endotracheal tube and feeding catheter are noted in satisfactory position. Cardiac shadow is stable. Persistent right basilar airspace opacity is noted although the right-sided effusion is decreased when compare with the prior exam secondary to drainage catheter. Small left-sided pleural effusion is noted. IMPRESSION: Improved aeration in the right lung base although persistent opacity is seen. Bilateral small effusions improved on the right. Electronically Signed   By: Alcide Clever M.D.   On: 11/16/2020 03:46   DG CHEST PORT 1 VIEW  Result Date: 11/14/2020 CLINICAL DATA:  Shortness of breath, recent cardiac arrest EXAM: PORTABLE CHEST 1 VIEW COMPARISON:   Film from the previous day. FINDINGS: Cardiac shadow is prominent but stable. Feeding catheter extends into the stomach. Endotracheal tube is noted  in satisfactory position. Right-sided PICC line is noted in satisfactory position. Known perihepatic drainage catheter is noted. Increasing right-sided pleural effusion is noted. Underlying atelectatic changes are likely present. Changes are similar to that seen on recent chest CT. Known left-sided effusion is not as well appreciated. No acute bony abnormality is noted. IMPRESSION: Increasing right-sided effusion with basilar atelectasis. The left-sided effusion is not as well appreciated on today's exam. Electronically Signed   By: Alcide Clever M.D.   On: 11/14/2020 02:15   DG Chest Port 1 View  Result Date: 11/13/2020 CLINICAL DATA:  Hypoxemia, dyspnea. EXAM: PORTABLE CHEST 1 VIEW COMPARISON:  11/06/2020 FINDINGS: Feeding tube is in place, tip beyond the gastroesophageal junction. Heart size is accentuated by technique. There are bilateral pleural effusions and bibasilar opacities which partially obscure the hemidiaphragms bilaterally. Increased airspace filling opacities. RIGHT-sided PICC line has been removed. RIGHT pigtail type catheter overlies the RIGHT lung base. IMPRESSION: Increased bilateral effusions and bibasilar opacities. Electronically Signed   By: Norva Pavlov M.D.   On: 11/13/2020 15:32   [place] EKG SITE RITE  Result Date: 11/13/2020 If Site Rite image not attached, placement could not be confirmed due to current cardiac rhythm.  US Abdomen Limited RUQ (LIVER/GB)  Result Date: 11/14/2020 CLINICAL DATA:  Abdominal distension. Recent exploratory laparotomy with pancreatic debridement. EXAM: ULTRASOUND ABDOMEN LIMITED RIGHT UPPER QUADRANT COMPARISON:  CT scan November 06, 2020 FINDINGS: Gallbladder: Poorly evaluated due to open incision and bandages. The gallbladder wall does appear thickened measuring up to 6.4 mm. Common bile duct: Diameter:  3.6 mm Liver: Diffuse increased echogenicity. Mildly nodular contour. No focal mass. Portal vein is patent on color Doppler imaging with normal direction of blood flow towards the liver. Other: A right pleural effusion is identified. Ascites is identified as well. IMPRESSION: 1. Limited study due to open wound and bandages. 2. The gallbladder in particular is poorly evaluated. There does appear to be some gallbladder wall thickening measuring up to 6.4 mm. 3. No common bile duct dilatation. 4. Nodular contour to the liver raising the possibility of cirrhosis. 5. Right pleural effusion.  Ascites. Electronically Signed   By: Gerome Sam III M.D.   On: 11/14/2020 06:38    Labs:  CBC: Recent Labs    11/14/20 0650 11/15/20 1019 11/16/20 0339 11/16/20 0412 11/17/20 0445  WBC 22.6* 16.4* 17.9*  --  17.8*  HGB 8.7* 8.0* 7.7* 8.2* 8.3*  HCT 29.4* 26.9* 26.5* 24.0* 27.1*  PLT 402* 263 247  --  231    COAGS: Recent Labs    10/25/2020 2102 10/22/20 0357 10/22/20 0826  INR 2.1* 2.1* 2.0*    BMP: Recent Labs    11/14/20 0510 11/15/20 0455 11/16/20 0339 11/16/20 0412 11/17/20 0445  NA 139 142 140 143 138  K 4.2 3.7 4.0 3.9 3.6  CL 105 106 103  --  103  CO2 --  27  GLUCOSE 148* 148* 208*  --  136*  BUN --  16  CALCIUM 8.2* 8.3* 8.1*  --  8.0*  CREATININE 0.53* 0.58* 0.68  --  0.67  GFRNONAA >60 >60 >60  --  >60    LIVER FUNCTION TESTS: Recent Labs    11/11/20 0415 11/13/20 0054 11/14/20 0508 11/14/20 0510 11/15/20 0455  BILITOT 0.8 0.7  --  0.7 0.6  AST 26 22  --  23 19  ALT 14 15  --  14 15  ALKPHOS 91 90  --  86 73  PROT 5.6* 5.8*  --  6.1* 6.0*  ALBUMIN 1.6* 1.6* 1.6* 1.7* 1.7*    Assessment and Plan:  Necrotizing pancreatitis 3 drains in place -- IR 9/26 Minimal OP Rising wbc and fever Imaging revealing abscess remain-- possible new abscess Scheduled now for upsize of all drains and possible new drain placement Risks and benefits  discussed with the patient's wife [name] via phone including bleeding, infection, damage to adjacent structures, bowel perforation/fistula connection, and sepsis.  All  questions were answered, [name] is agreeable to proceed. Consent signed and in chart.    Electronically Signed: Robet Leu, PA-C 11/17/2020, 7:02 AM   I spent a total of 25 Minutes at the the patient's bedside AND on the patient's hospital floor or unit, greater than 50% of which was counseling/coordinating care for abdominal abscesses

## 2020-11-17 NOTE — Progress Notes (Signed)
Patient transported to IR for tube exchange. Patient on precedex and levo. Vital signs stable. Report given to IR RN, this RN's phone number given to IR RN in the event I need to return to IR and for report post-procedure.

## 2020-11-17 NOTE — Progress Notes (Signed)
   11/17/20 0745  Airway 7.5 mm  Placement Date/Time: 11/14/20 0150   Grade View: Grade 1  Placed By: ICU physician  Airway Device: Endotracheal Tube  Laryngoscope Blade: MAC;4  ETT Types: Oral  Size (mm): 7.5 mm  Cuffed: Cuffed  Placement Confirmation: Direct Visualization;Bilateral...  Secured at (cm) 26 cm  Measured From [place] By Commercial Tube Holder  Tube Holder Repositioned Yes  Prone position No  Cuff Pressure (cm H2O) Clear OR 27-39 CmH2O  Site Condition Dry  Adult Ventilator Settings  Vent Type Servo i  Humidity HME  Vent Mode PSV;CPAP  FiO2 (%) 40 %  Pressure Support 10 cmH20  PEEP 8 cmH20  Adult Ventilator Measurements  Peak Airway Pressure 18 L/min  Mean Airway Pressure 12 cmH20  Resp Rate Spontaneous 21 br/min  Resp Rate Total 21 br/min  Spont TV 444 mL  Measured Ve 9.1 mL  Auto PEEP 0 cmH20  Total PEEP 8 cmH20  SpO2 99 %  Adult Ventilator Alarms  Alarms On Y  Ve High Alarm 20 L/min  Ve Low Alarm 4 L/min  Resp Rate High Alarm 50 br/min  Resp Rate Low Alarm 12  PEEP Low Alarm 5 cmH2O  Press High Alarm 45 cmH2O  T Apnea 20 sec(s)  VAP Prevention  HME changed No  HOB> 30 Degrees Y  Daily Weaning Assessment  Daily Assessment of Readiness to Wean Wean protocol criteria met (SBT performed)  SBT Method CPAP 5 cm H20 and PS 5 cm H20 (PS 10)  Weaning Start Time 0745  Breath Sounds  Bilateral Breath Sounds Diminished;Rhonchi  Airway Suctioning/Secretions  Suction Type ETT  Suction Device  Catheter  Inline suction changed Yes  Secretion Amount Small  Secretion Color Tan  Secretion Consistency Thick  Suction Tolerance Tolerated well  Suctioning Adverse Effects None

## 2020-11-17 NOTE — Progress Notes (Signed)
Progress Note  27 Days Post-Op  Subjective: Pt on the vent and less agitated. He does awaken and nod to questions. His wife is planning on being back later this AM.   Objective: Vital signs in last 24 hours: Temp:  [98.1 F (36.7 C)-102.3 F (39.1 C)] 100.3 F (37.9 C) (10/05 0845) Pulse Rate:  [59-96] 65 (10/05 0900) Resp:  [18-30] 22 (10/05 0900) BP: (83-154)/(55-104) 126/74 (10/05 0900) SpO2:  [97 %-100 %] 97 % (10/05 0927) FiO2 (%):  [40 %-60 %] 40 % (10/05 0927) Weight:  [101.9 kg] 101.9 kg (10/05 0500) Last BM Date: 11/16/20 (flexiseal)  Intake/Output from previous day: 10/04 0701 - 10/05 0700 In: 2933.3 [I.V.:766.5; NG/GT:1110; IV Piggyback:1041.8] Out: 2450 [Urine:1230; Emesis/NG output:250; Drains:120; Stool:850] Intake/Output this shift: Total I/O In: 82.4 [I.V.:69.6; IV Piggyback:12.8] Out: 35 [Urine:35]  PE: Gen:  calm on the vent Card:  RRR Pulm:  CTAB, mechanically ventilated Abd: distended, IR drain x2 on the right and IR drain x1 on the left draining purulent fluid, drainage pouch over previous surgical drain sites with minimal purulent fluid, midline wound clean with granulation tissue present; rectal pouch in place with watery stool    Lab Results:  Recent Labs    11/16/20 0339 11/16/20 0412 11/17/20 0445  WBC 17.9*  --  17.8*  HGB 7.7* 8.2* 8.3*  HCT 26.5* 24.0* 27.1*  PLT 247  --  231   BMET Recent Labs    11/16/20 0339 11/16/20 0412 11/17/20 0445  NA 140 143 138  K 4.0 3.9 3.6  CL 103  --  103  CO2 27  --  27  GLUCOSE 208*  --  136*  BUN 17  --  16  CREATININE 0.68  --  0.67  CALCIUM 8.1*  --  8.0*   PT/INR No results for input(s): LABPROT, INR in the last 72 hours. CMP     Component Value Date/Time   NA 138 11/17/2020 0445   K 3.6 11/17/2020 0445   CL 103 11/17/2020 0445   CO2 27 11/17/2020 0445   GLUCOSE 136 (H) 11/17/2020 0445   BUN 16 11/17/2020 0445   CREATININE 0.67 11/17/2020 0445   CALCIUM 8.0 (L) 11/17/2020  0445   PROT 6.0 (L) 11/15/2020 0455   ALBUMIN 1.7 (L) 11/15/2020 0455   AST 19 11/15/2020 0455   ALT 15 11/15/2020 0455   ALKPHOS 73 11/15/2020 0455   BILITOT 0.6 11/15/2020 0455   GFRNONAA >60 11/17/2020 0445   Lipase     Component Value Date/Time   LIPASE 29 11/13/2020 0054       Studies/Results: CT ABDOMEN PELVIS W CONTRAST  Result Date: 11/16/2020 CLINICAL DATA:  A 55 year old male with signs of sepsis and bowel perforation suspected. EXAM: CT ABDOMEN AND PELVIS WITH CONTRAST TECHNIQUE: Multidetector CT imaging of the abdomen and pelvis was performed using the standard protocol following bolus administration of intravenous contrast. CONTRAST:  26mL OMNIPAQUE IOHEXOL 350 MG/ML SOLN COMPARISON:  Multiple prior studies most recent comparison from November 13, 2020. FINDINGS: Lower chest: Catheter for venous access terminates at the caval to atrial junction. The central pulmonary vasculature with engorgement of main pulmonary artery to 3.8 cm, partially visualized. Dense basilar consolidation and LEFT-sided effusion is similar to the recent CT of the chest, abdomen and pelvis as is a RIGHT effusion with RIGHT basilar airspace disease. Hepatobiliary: Perihepatic fluid diminished since previous imaging. Drain remains in place entering via RIGHT flank approach between ribs 8 and 9. No focal,  suspicious hepatic lesion. Gallbladder is contracted. No gross biliary duct distension. Pancreas: Signs of pancreatic necrosis with large necrotic collection in the anterior pararenal space tracking into the transverse mesocolon where there is evidence of frank colonic communication best seen on sagittal image 108/7. A small amount of ingested contrast media is seen in the dependent aspect of the necrotic collection in this location. This communicates with the perihepatic fluid as well and with necrotic debris in the LEFT retroperitoneum. A drain entering via LEFT flank approach terminates in the anterior  pararenal space in the LEFT retroperitoneum. Collection in the LEFT retroperitoneum measuring 11.1 x 5.0 cm previously 12.0 x 4.7 cm. Collection in the transverse mesocolon 5.6 x 4.3 cm (image 50/3) previously 6.2 x 4.4 cm. Drain in the RIGHT anterior pararenal space is similar. Lower extension of LEFT flank fluid into the LEFT iliac fossa measures 4.9 x 4.2 cm as compared to 5.3 x 4.9 cm (image 69/3) Signs of diffuse peritoneal inflammation and omental stranding. Perienteric fluid tracking in the anterior abdomen. Tract extending through the abdominal wall on the RIGHT in the mid abdomen communicates with necrotic debris in the transverse mesocolon. Spleen: Unremarkable currently. Adrenals/Urinary Tract: Adrenal glands normal. Symmetric renal enhancement. No hydronephrosis. Collapsed urinary bladder. Stomach/Bowel: Feeding tube in the proximal jejunum beyond the ligament of Treitz. Signs of mesenteric inflammation. Bowel wall thickening throughout the small bowel in the abdomen. Colonic communication with walled-off necrosis in the retroperitoneum and pancreatic bed. Vascular/Lymphatic: Aortic atherosclerosis of the abdominal aorta without aneurysm. Smooth contour of the IVC. Narrowing of the splenic vein as it passes through the necrotic collection in the retroperitoneum. There is no gastrohepatic or hepatoduodenal ligament lymphadenopathy. No retroperitoneal or mesenteric lymphadenopathy. No pelvic sidewall lymphadenopathy. Reproductive: Unremarkable. Other: Diffuse inflammation throughout the abdomen. Ascites and interloop fluid with similar volume. Fluid about the liver is diminished in volume compared to the previous study, drain remaining in place, other surgical drains and collections as outlined above. There is also stranding and fat necrosis as well as inflammation tracking into the RIGHT iliac fossa less than on the LEFT. Musculoskeletal: No acute bone finding. No destructive bone process. Spinal  degenerative changes. IMPRESSION: Homero Fellers colonic communication along the transverse colon into the mesocolic portion of the necrotic collection with extensive debris and fluid throughout the retroperitoneum in the setting of necrotic pancreatitis. Sequela of necrotic pancreatitis extends into the RIGHT upper quadrant about the liver, below the LEFT hemidiaphragm and throughout the LEFT and RIGHT retroperitoneum into the pelvis on the LEFT greater than RIGHT. Numerous drains in place still with considerable debris in fluid in the abdomen though with diminished amount of fluid and gas along the liver when compared to previous imaging. Persistent basilar airspace disease and effusions LEFT worse than RIGHT. Consider the possibility of developing infection at the LEFT lung base. Tract extending through the abdominal wall on the RIGHT in the mid abdomen communicates with necrotic debris in the transverse mesocolon. Signs of diffuse peritoneal inflammation and omental stranding. Bowel wall thickening throughout small bowel in the abdomen likely related to diffuse enteritis in the setting diffuse peritoneal inflammation. Findings of bowel communication with necrotic collection were called to the provider caring for the patient at the time of dictation. These results were called by telephone at the time of interpretation on 11/16/2020 at 2:23 pm to provider [facility] , who verbally acknowledged these results. Electronically Signed   By: Donzetta Kohut M.D.   On: 11/16/2020 14:24   DG CHEST  PORT 1 VIEW  Result Date: 11/16/2020 CLINICAL DATA:  Respiratory failure EXAM: PORTABLE CHEST 1 VIEW COMPARISON:  11/14/2020 FINDINGS: Endotracheal tube and feeding catheter are noted in satisfactory position. Cardiac shadow is stable. Persistent right basilar airspace opacity is noted although the right-sided effusion is decreased when compare with the prior exam secondary to drainage catheter. Small left-sided pleural effusion  is noted. IMPRESSION: Improved aeration in the right lung base although persistent opacity is seen. Bilateral small effusions improved on the right. Electronically Signed   By: Alcide Clever M.D.   On: 11/16/2020 03:46    Anti-infectives: Anti-infectives (From admission, onward)    Start     Dose/Rate Route Frequency Ordered Stop   11/17/20 1300  anidulafungin (ERAXIS) 100 mg in sodium chloride 0.9 % 100 mL IVPB        100 mg 78 mL/hr over 100 Minutes Intravenous Every 24 hours 11/16/20 1141     11/16/20 1300  anidulafungin (ERAXIS) 200 mg in sodium chloride 0.9 % 200 mL IVPB        200 mg 78 mL/hr over 200 Minutes Intravenous  Once 11/16/20 1141 11/16/20 1700   11/15/20 1015  linezolid (ZYVOX) IVPB 600 mg        600 mg 300 mL/hr over 60 Minutes Intravenous Every 12 hours 11/15/20 0919     11/14/20 1200  vancomycin (VANCOREADY) IVPB 750 mg/150 mL  Status:  Discontinued        750 mg 150 mL/hr over 60 Minutes Intravenous Every 8 hours 11/14/20 0251 11/15/20 0919   11/14/20 0330  vancomycin (VANCOREADY) IVPB 2000 mg/400 mL        2,000 mg 200 mL/hr over 120 Minutes Intravenous  Once 11/14/20 0251 11/14/20 0519   11/12/20 1900  piperacillin-tazobactam (ZOSYN) IVPB 3.375 g        3.375 g 12.5 mL/hr over 240 Minutes Intravenous Every 8 hours 11/12/20 1538     11/09/20 1730  Ampicillin-Sulbactam (UNASYN) 3 g in sodium chloride 0.9 % 100 mL IVPB  Status:  Discontinued        3 g 200 mL/hr over 30 Minutes Intravenous Every 6 hours 11/09/20 1617 11/12/20 1538   11/06/20 1545  piperacillin-tazobactam (ZOSYN) IVPB 3.375 g  Status:  Discontinued        3.375 g 12.5 mL/hr over 240 Minutes Intravenous Every 8 hours 11/06/20 1453 11/09/20 1616   11/01/20 0900  levofloxacin (LEVAQUIN) IVPB 750 mg  Status:  Discontinued        750 mg 100 mL/hr over 90 Minutes Intravenous Every 24 hours 11/01/20 0824 11/06/20 1408   11/01/20 0900  metroNIDAZOLE (FLAGYL) IVPB 500 mg  Status:  Discontinued        500  mg 100 mL/hr over 60 Minutes Intravenous Every 12 hours 11/01/20 0824 11/06/20 1408   10/29/20 2200  meropenem (MERREM) 1 g in sodium chloride 0.9 % 100 mL IVPB  Status:  Discontinued        1 g 200 mL/hr over 30 Minutes Intravenous Every 8 hours 10/29/20 1428 11/01/20 0824   10/28/20 1615  meropenem (MERREM) 2 g in sodium chloride 0.9 % 100 mL IVPB  Status:  Discontinued        2 g 200 mL/hr over 30 Minutes Intravenous Every 8 hours 10/28/20 1515 10/29/20 1428   10/28/20 1445  metroNIDAZOLE (FLAGYL) IVPB 500 mg  Status:  Discontinued        500 mg 100 mL/hr over 60 Minutes Intravenous Every 12 hours  10/28/20 1348 10/28/20 1505   10/25/20 2300  fluconazole (DIFLUCAN) IVPB 400 mg        400 mg 100 mL/hr over 120 Minutes Intravenous Every 24 hours 10/25/20 0742 10/29/20 0718   10/24/20 2300  fluconazole (DIFLUCAN) IVPB 200 mg  Status:  Discontinued        200 mg 100 mL/hr over 60 Minutes Intravenous Every 24 hours 10/24/20 0943 10/25/20 0742   10/22/20 1800  vancomycin (VANCOREADY) IVPB 1500 mg/300 mL  Status:  Discontinued        1,500 mg 150 mL/hr over 120 Minutes Intravenous Every 24 hours 11/03/2020 2225 10/22/20 0759   10/22/20 0900  linezolid (ZYVOX) IVPB 600 mg  Status:  Discontinued        600 mg 300 mL/hr over 60 Minutes Intravenous Every 12 hours 10/22/20 0800 10/27/20 0852   10/22/20 0300  piperacillin-tazobactam (ZOSYN) IVPB 3.375 g  Status:  Discontinued        3.375 g 12.5 mL/hr over 240 Minutes Intravenous Every 8 hours 10/20/2020 2225 10/28/20 1514   11/10/2020 2335  metroNIDAZOLE (FLAGYL) IVPB 500 mg  Status:  Discontinued        500 mg 100 mL/hr over 60 Minutes Intravenous Every 12 hours 11/08/2020 2142 10/22/20 0747   11/08/2020 2248  fluconazole (DIFLUCAN) IVPB 400 mg  Status:  Discontinued        400 mg 100 mL/hr over 120 Minutes Intravenous Every 24 hours 11/12/2020 2142 10/24/20 0943   10/27/2020 1748  vancomycin (VANCOREADY) IVPB 2000 mg/400 mL        2,000 mg 200 mL/hr over  120 Minutes Intravenous  Once 10/30/2020 1706 10/14/2020 2113   10/26/2020 1715  piperacillin-tazobactam (ZOSYN) IVPB 3.375 g        3.375 g 100 mL/hr over 30 Minutes Intravenous  Once 10/23/2020 1706 10/22/2020 1903        Assessment/Plan POD 26 s/p ex lap with pancreatic debridement for infected necrotic pancreatitis by Dr. Sheliah Hatch on 9/9 - Superior drain under the mesocolon along with the head of the pancreas - Inferior drain under the mesocolon along the left upper abdomen and what is likely a position inferior to the pancreas - CT 9/24 w/ gas and fluid collections that are grossly stable in the retroperitoneum and are drained by JP tubes. Cont drains - CT 9/24 also with mildly increased amount of fluid is noted around the liver with an increased amount of air seen in the non dependent portion concerning for persistent bowel leak. - pt is having bowel function and IR drains placed 9/26 do not appear feculent. Low clinical suspicion for bowel leak - s/p IR drains 9/26. Cx with enterococcus faecalis, sensitivities pending  - BID WTD - Patient pulled out surgical drain x2 on 10/1, he still has IR drain x3 in place. These appear to be in good position and are all putting out purulent drainage - CT 10/1 air-fluid collections throughout the abdomen and pelvis have not significantly changed in size or distribution; New wall thickening of the splenic flexure of the colon worrisome for reactive colitis - Previous drain sites leaking -  agree with drainage pouch.  - CT 10/4 with colonic communication between transverse colon into mesocolon and necrotic collection, significant retroperitoneal collections - IR planning exchange/upsize of drains today to try to get better drainage of collections.  Surgical intervention at this point would be very high risk. We will follow very closely but prognosis seems guarded.    FEN: NPO, hold  TF ID: currently on zosyn and linezolid VTE: SCDs, heparin subq Foley -  placed 10/2   - Per primary -  VDRF - reintubated 10/2, on full vent support this AM PEA arrest post-intubation  Septic shock, possible aspiration B/l pleural effusions Abl anemia  ETOH use  AKI T2DM  LOS: 27 days    Juliet Rude, [facility] Surgery 11/17/2020, 9:47 AM Please see Amion for pager number during day hours 7:00am-4:30pm

## 2020-11-17 NOTE — Progress Notes (Signed)
NAME:  Paul Chambers, MRN:  [id number], DOB:  03-08-1965, LOS: 27 ADMISSION DATE:  10/30/2020, CONSULTATION DATE:  11/14/2020 REFERRING MD:  Dr. Tereasa Coop, Reason for consult: Acute respiratory failure  History of Present Illness:  Patient is encephalopathic and/or intubated. Therefore history has been obtained from chart review.   Hai Grabe, is a 55 y.o. male, who was admitted to [facility] on 11/05/2020 with abdominal pain.  With a pertinent pmx of alcoholism, HTN, gout, pancreatitis, tobacco use disorder.  He was found to have pneumoperitoneum without clear perforation. He was started on vanc and zosyn. He underwent ex lap on 9/9 and was found to have a large amount of necrotic fluid surrounding his pancreas, which was concerning for necrotic infected pancreas, two drains were placed. No bowel perforation was noted.  His hospital course has been complicated by acute respiratory failure secondary to possible aspiration pneumonia, acute blood loss anemia requiring transfusion, delirium, severe malnutrition requiring TPN. Was weaned of TPN and TF was started. He continued to spike fevers and was tachycardic despite antibiotics. ID consulted on 9/24 and changed abx to IV zosyn. Repeat CT demonsdtated new perihepatic fluid. On 9/26 IR placed 3 new drains. Fluid cultures resulted with enterococcus and clostridium.  The early morning of 10/2 PCCM was called to bedside for O2 sats in the 70's with the patient not responding to noxious stimuli. The patient was emergently intubated. Shortly after the patient had a PEA arrest with 2 minutes of CPR until ROSC. 1 epi was given. The patient was transferred to ICU.  Pertinent  Medical History  alcoholism, HTN, gout, pancreatitis, tobacco abuse, cirrhosis   Significant Hospital Events: Including procedures, antibiotic start and stop dates in addition to other pertinent events   9/8 - Presented to [facility] with abdominal pain. CT A/P with extensive pneumoperitoneum 9/9 -  Ex-lap (no colonic perforation discovered, large necrotic fluid present surrounding pancreas. Transferred to ICU post-op, intubated and sedated.  9/9-9/15 Fluconazole, 9/9-9/15 linezolid, zosyn 9/9-9/15, meropenem 9/15-9/19, levofloxacin 9/19-9/24, flagyl 9/19-9/24, zosyn 9/24-9/27, unasyn 9/27-9/30, zosyn 9/30-present, vancomycin 10/2-10/3, linezolid 10/3, Eraxis 10/4 9/10-received 1 unit PRBC for hemoglobin 6.7, went back on Levophed for short period of time 9/12 - Extubated to Venturi mask 9/13 - Off Fentanyl and Propofol gtt 9/15 Transferred to [facility] 9/24 IV Zosyn 9/26 CT a/p showed perihepatic fluid, IR Drains placed 10/2 PCCM consulted for respiratory failure, intubated, PEA arrest s/p intubation, 2 minutes CPR, Transferred to icu 10/4 CT A/P with colonic communication between transverse colon into mesocolon and necrotic collection, significant retroperitoneal collections 10/5 IR for exchange of drains   Interim History / Subjective:  Patient intubated, awake and alert. Vasopressor support started ON up to 6 mcg.  Objective   Blood pressure 116/76, pulse (!) 57, temperature 100.3 F (37.9 C), temperature source Bladder, resp. rate (!) 30, height 5\' 9"  (1.753 m), weight 101.9 kg, SpO2 100 %.    Vent Mode: PRVC FiO2 (%):  [40 %-100 %] 40 % Set Rate:  [30 bmp] 30 bmp Vt Set:  [420 mL] 420 mL PEEP:  [8 cmH20-10 cmH20] 10 cmH20 Pressure Support:  [10 cmH20] 10 cmH20 Plateau Pressure:  [21 cmH20-27 cmH20] 27 cmH20   Intake/Output Summary (Last 24 hours) at 11/17/2020 1406 Last data filed at 11/17/2020 1300 Gross per 24 hour  Intake 1864.69 ml  Output 1280 ml  Net 584.69 ml    Filed Weights   11/08/20 0500 11/11/20 0500 11/17/20 0500  Weight: 96.1 kg 98.3 kg 101.9 kg  Examination: General: In bed, NAD, appears comfortable HEENT: MM pink/moist, anicteric, atraumatic Neuro: RASS 0 to +1, PERRL 35mm CV: S1S2, ST, no m/r/g appreciated PULM:  No rales or rhonchi, trachea midline,  chest expansion symmetric GI: distended, bsx4 hypoactive, firm, x3 JP drains, yellow drainage from jp sites, midline wound dressed c/d/I  Extremities: warm/dry, no pretibial edema, capillary refill less than 3 seconds     Resolved Hospital Problem list   Transaminitis AKI  Assessment & Plan:   Acute respiratory failure with hypoxia Likely due to aspiration pneumonitis resulting in ALI with concomitant atelectasis 2/2 moderate bilateral pleural effusions which were seen on 10/1 CT chest  -Low tidal volume strategy with tidal volumes of 4-8 cc/kg ideal body weight -Goal plateau pressures less than 30 and driving pressures less than 15 -Wean PEEP/FiO2 for SpO2 92-98% -VAP bundle -S/p lasix 40mg  IV x1 10/2, 10/3, repeat lasix 40mg  IV this AM, plus spironolactone per below may require thoracentesis if difficult to wean vent settings -Daily SAT and SBT -PAD bundle with Precedex gtt and fentanyl push -RASS goal 0 to -1 -ABX as discussed below  Septic shock secondary to infected necrotizing pancreatitis, possible aspiration pneumonia Intra abdominal abscess Colonic communication w/ necrotic collection  JP drain culture growth with rare enterococcus faecalis, clostridium WBC stable on AM labs, and patient afebrile ON but fever to 102.3 at 0701. In addition patient noted to have increasing pressor requirements with levophed up to 12/2.   Patient removed x2 JP drains on 10/1, x3 JP drains in place CT abdomen 10/1 with air-fluid collections throughout the abdomen. Air fluid collection near pancreas. Gallbladder wall edema.  CT A/p 10/4 showed colonic communication between transverse colon into mesocolon and necrotic collection, significant retroperitoneal collections.  -ID consulted appreciate assistance -Continue zosyn, continue linezolid 10/3 AM, eraxis started 10/4 AM  -Goal MAP greater than 65. Wean levophed as able -General surgery consulted and following. Surgical site management and drain  management per GS.  -Given fluid collections on CT A/P plan for IR thsi AM for upsizing of drains.  -Transition patient to IV only medications, will likely need TPN -NPO   Acute metabolic encephalopathy Suspect secondary to sepsis, hypoxia, and elevated ammonia level  -Continues to follow commands  this AM  -Continue antimicrobials per above   Acute blood loss anemia  -Hgb stable  -Transfuse PRBC if HBG less than 7 -Monitor for signs of bleeding  DM2 -Patient previously on novolog 8u q6h, semeglee 20u qd, +SSI. Required 21u of SSI with scheduled novolog discontinued 10/3 AM. Patient also did not get long acting. 14u SSI ON. Held insulin this AM given TF were stopped.  -SSI only   Diarrhea Suspect secondary to TF + lactulose  -continue FMS   Likely alcoholic cirrhosis  HX ETOH abuse -INR elevated, 2 on admission,  RUQ 10/2 with nodular contour and ascites.  -Spironolactone 25mg  started 10/4 to help with pleural effusions and ascites, hold diuretics in the setting of shock    -Consider further imaging to characterize liver  -Thiamine, folate   Severe protein calorie malnutrition -Hold TF, per above, will likely need TPN   In hospital PEA arrest On 10/2. Suspect secondary to hypoxia/hypotension. Occurred shortly after intubation. 2 min cpr. 1 epi given RSI drugs limiting post ROSC neuro exam. -CTM   Best Practice (right click and "Reselect all SmartList Selections" daily)   Diet/type: NPO Hold Tfs  DVT prophylaxis: prophylactic heparin  GI prophylaxis: PPI Lines: yes and it is  still needed Foley:  N/A Code Status:  full code Last date of multidisciplinary goals of care discussion: GOC discussion with spouse given poor prognosis   Labs   CBC: Recent Labs  Lab 11/13/20 0054 11/14/20 0240 11/14/20 0650 11/15/20 1019 11/16/20 0339 11/16/20 0412 11/17/20 0445  WBC 14.9*  --  22.6* 16.4* 17.9*  --  17.8*  NEUTROABS 12.4*  --   --   --   --   --   --   HGB 8.6*    < > 8.7* 8.0* 7.7* 8.2* 8.3*  HCT 28.3*   < > 29.4* 26.9* 26.5* 24.0* 27.1*  MCV 102.9*  --  106.5* 106.3* 105.6*  --  101.5*  PLT 382  --  402* 263 247  --  231   < > = values in this interval not displayed.     Basic Metabolic Panel: Recent Labs  Lab 11/11/20 0415 11/13/20 0054 11/14/20 0240 11/14/20 0508 11/14/20 0510 11/15/20 0455 11/16/20 0339 11/16/20 0412 11/17/20 0445  NA 134* 137   < > 141 139 142 140 143 138  K 3.8 3.5   < > 5.2* 4.2 3.7 4.0 3.9 3.6  CL 98 102  --  107 105 106 103  --  103  CO2 29 28  --  22 23 29 27   --  27  GLUCOSE 214* 224*  --  154* 148* 148* 208*  --  136*  BUN 6 10  --  12 11 15 17   --  16  CREATININE 0.53* 0.57*  --  0.43* 0.53* 0.58* 0.68  --  0.67  CALCIUM 7.9* 7.9*  --  8.4* 8.2* 8.3* 8.1*  --  8.0*  MG 1.9 2.0  --   --  2.0 2.1 2.1  --  2.0  PHOS 2.5 2.1*  --  4.3  --  3.9  --   --   --    < > = values in this interval not displayed.    GFR: Estimated Creatinine Clearance: 127.1 mL/min (by C-G formula based on SCr of 0.67 mg/dL). Recent Labs  Lab 11/14/20 0507 11/14/20 0650 11/14/20 0936 11/15/20 1019 11/16/20 0339 11/17/20 0445  WBC  --  22.6*  --  16.4* 17.9* 17.8*  LATICACIDVEN 1.9  --  1.8  --   --   --      Liver Function Tests: Recent Labs  Lab 11/11/20 0415 11/13/20 0054 11/14/20 0508 11/14/20 0510 11/15/20 0455  AST 26 22  --  23 19  ALT 14 15  --  14 15  ALKPHOS 91 90  --  86 73  BILITOT 0.8 0.7  --  0.7 0.6  PROT 5.6* 5.8*  --  6.1* 6.0*  ALBUMIN 1.6* 1.6* 1.6* 1.7* 1.7*    Recent Labs  Lab 11/13/20 0054  LIPASE 29    Recent Labs  Lab 11/13/20 0054 11/14/20 0508 11/16/20 0339  AMMONIA 155* 73* 38*     ABG    Component Value Date/Time   PHART 7.424 11/16/2020 0412   PCO2ART 43.4 11/16/2020 0412   PO2ART 59 (L) 11/16/2020 0412   HCO3 28.1 (H) 11/16/2020 0412   TCO2 29 11/16/2020 0412   O2SAT 89.0 11/16/2020 0412      Coagulation Profile: No results for input(s): INR, PROTIME in  the last 168 hours.  Cardiac Enzymes: No results for input(s): CKTOTAL, CKMB, CKMBINDEX, TROPONINI in the last 168 hours.   HbA1C: Hgb A1c MFr Bld  Date/Time Value  Ref Range Status  10/14/2020 10:13 PM 6.8 (H) 4.8 - 5.6 % Final    Comment:    (NOTE) Pre diabetes:          5.7%-6.4%  Diabetes:              >6.4%  Glycemic control for   <7.0% adults with diabetes     CBG: Recent Labs  Lab 11/16/20 2016 11/16/20 2313 11/17/20 0304 11/17/20 0816 11/17/20 1255  GLUCAP 129* 113* 129* 133* 152*     Review of Systems:   Unable to obtain due to patient status.  Past Medical History:  He,  has a past medical history of Alcoholism (HCC), Gout, Hypertension, Pancreatitis, and Tobacco abuse.   Surgical History:   Past Surgical History:  Procedure Laterality Date   LAPAROSCOPY N/A 11/06/2020   Procedure: LAPAROSCOPY DIAGNOSTIC;  Surgeon: Sheliah Hatch De Blanch, MD;  Location: [facility] OR;  Service: General;  Laterality: N/A;   LAPAROTOMY N/A 10/19/2020   Procedure: EXPLORATORY LAPAROTOMY AND PANCREATIC DEBRIDEMENT;  Surgeon: Sheliah Hatch, De Blanch, MD;  Location: [facility] OR;  Service: General;  Laterality: N/A;     Social History:   reports that he has been smoking cigarettes. He does not have any smokeless tobacco history on file. He reports that he does not currently use alcohol.   Family History:  His family history includes Diverticulitis in his mother; Liver cancer in his maternal aunt.   Allergies No Known Allergies   Home Medications  Prior to Admission medications   Medication Sig Start Date End Date Taking? Authorizing Provider  acetaminophen (TYLENOL) 500 MG tablet Take 500 mg by mouth every 6 (six) hours as needed for mild pain.   Yes [provider]  albuterol (VENTOLIN HFA) 108 (90 Base) MCG/ACT inhaler Inhale 1-2 puffs into the lungs every 6 (six) hours as needed for wheezing or shortness of breath.   Yes [provider]  allopurinol (ZYLOPRIM) 300 MG  tablet Take 300 mg by mouth daily.   Yes [provider]  ALPRAZolam Prudy Feeler) 0.5 MG tablet Take 0.5 mg by mouth 3 (three) times daily as needed for anxiety.   Yes [provider]  amLODipine (NORVASC) 5 MG tablet Take 5 mg by mouth daily.   Yes [provider]  Fluticasone-Umeclidin-Vilant (TRELEGY ELLIPTA) 100-62.5-25 MCG/INH AEPB Inhale 1 puff into the lungs daily.   Yes [provider]  folic acid (FOLVITE) 1 MG tablet Take 1 mg by mouth daily.   Yes [provider]  furosemide (LASIX) 40 MG tablet Take 40 mg by mouth 2 (two) times daily.   Yes [provider]  Multiple Vitamins-Minerals (PRESERVISION AREDS 2+MULTI VIT PO) Take 2 tablets by mouth daily.   Yes [provider]  Omega-3 Krill Oil 500 MG CAPS Take 1,000 mg by mouth daily.   Yes [provider]  omeprazole (PRILOSEC) 40 MG capsule Take 40 mg by mouth daily.   Yes [provider]  oxyCODONE (OXY IR/ROXICODONE) 5 MG immediate release tablet Take 2.5-5 mg by mouth every 6 (six) hours as needed for severe pain.   Yes [provider]  thiamine (VITAMIN B-1) 100 MG tablet Take 100 mg by mouth daily.   Yes [provider]      Marolyn Haller, MD PGY-2 Internal Medicine  Pager [phone]

## 2020-11-17 NOTE — Progress Notes (Signed)
RT NOTES: Pt's RR in the 40s, double stacking breaths. Placed back on full support at this time. RN getting sedation.

## 2020-11-17 NOTE — Progress Notes (Signed)
RT NOTE: RT transported patient on ventilator from room 2M04 to IR and back to room 2M04 with no complications. Vitals are stable. RT will continue to monitor.

## 2020-11-18 ENCOUNTER — Inpatient Hospital Stay: Payer: Self-pay

## 2020-11-18 DIAGNOSIS — A419 Sepsis, unspecified organism: Secondary | ICD-10-CM | POA: Diagnosis not present

## 2020-11-18 DIAGNOSIS — E8809 Other disorders of plasma-protein metabolism, not elsewhere classified: Secondary | ICD-10-CM | POA: Diagnosis not present

## 2020-11-18 DIAGNOSIS — D539 Nutritional anemia, unspecified: Secondary | ICD-10-CM | POA: Diagnosis not present

## 2020-11-18 DIAGNOSIS — K8591 Acute pancreatitis with uninfected necrosis, unspecified: Secondary | ICD-10-CM | POA: Diagnosis not present

## 2020-11-18 LAB — GLUCOSE, CAPILLARY
Glucose-Capillary: 111 mg/dL — ABNORMAL HIGH (ref 70–99)
Glucose-Capillary: 114 mg/dL — ABNORMAL HIGH (ref 70–99)
Glucose-Capillary: 136 mg/dL — ABNORMAL HIGH (ref 70–99)
Glucose-Capillary: 154 mg/dL — ABNORMAL HIGH (ref 70–99)
Glucose-Capillary: 174 mg/dL — ABNORMAL HIGH (ref 70–99)
Glucose-Capillary: 180 mg/dL — ABNORMAL HIGH (ref 70–99)

## 2020-11-18 LAB — PHOSPHORUS: Phosphorus: 4.3 mg/dL (ref 2.5–4.6)

## 2020-11-18 LAB — CBC
HCT: 24.5 % — ABNORMAL LOW (ref 39.0–52.0)
Hemoglobin: 7.5 g/dL — ABNORMAL LOW (ref 13.0–17.0)
MCH: 30.6 pg (ref 26.0–34.0)
MCHC: 30.6 g/dL (ref 30.0–36.0)
MCV: 100 fL (ref 80.0–100.0)
Platelets: 200 10*3/uL (ref 150–400)
RBC: 2.45 MIL/uL — ABNORMAL LOW (ref 4.22–5.81)
RDW: 18.7 % — ABNORMAL HIGH (ref 11.5–15.5)
WBC: 11.7 10*3/uL — ABNORMAL HIGH (ref 4.0–10.5)
nRBC: 0.2 % (ref 0.0–0.2)

## 2020-11-18 LAB — MAGNESIUM: Magnesium: 2 mg/dL (ref 1.7–2.4)

## 2020-11-18 LAB — COMPREHENSIVE METABOLIC PANEL
ALT: 19 U/L (ref 0–44)
AST: 20 U/L (ref 15–41)
Albumin: <1.5 g/dL — ABNORMAL LOW (ref 3.5–5.0)
Alkaline Phosphatase: 82 U/L (ref 38–126)
Anion gap: 8 (ref 5–15)
BUN: 15 mg/dL (ref 6–20)
CO2: 27 mmol/L (ref 22–32)
Calcium: 7.9 mg/dL — ABNORMAL LOW (ref 8.9–10.3)
Chloride: 106 mmol/L (ref 98–111)
Creatinine, Ser: 0.66 mg/dL (ref 0.61–1.24)
GFR, Estimated: >60 mL/min (ref >60)
Glucose, Bld: 132 mg/dL — ABNORMAL HIGH (ref 70–99)
Potassium: 3.8 mmol/L (ref 3.5–5.1)
Sodium: 141 mmol/L (ref 135–145)
Total Bilirubin: 0.5 mg/dL (ref 0.3–1.2)
Total Protein: 5.6 g/dL — ABNORMAL LOW (ref 6.5–8.1)

## 2020-11-18 LAB — TRIGLYCERIDES: Triglycerides: 123 mg/dL (ref <150)

## 2020-11-18 MED ORDER — SODIUM CHLORIDE 0.9% FLUSH
50.0000 mL | Freq: Three times a day (TID) | INTRAVENOUS | Status: DC
  Administered 2020-11-18 – 2020-11-27 (×27): 50 mL
  Filled 2020-11-18 (×8): qty 51

## 2020-11-18 MED ORDER — LACTULOSE ENEMA
300.0000 mL | Freq: Every day | ORAL | Status: DC
  Filled 2020-11-18: qty 300

## 2020-11-18 MED ORDER — MIDAZOLAM HCL 2 MG/2ML IJ SOLN
2.0000 mg | INTRAMUSCULAR | Status: DC | PRN
  Administered 2020-11-18 – 2020-11-27 (×46): 2 mg via INTRAVENOUS
  Filled 2020-11-18 (×50): qty 2

## 2020-11-18 MED ORDER — TRACE MINERALS CU-MN-SE-ZN 300-55-60-3000 MCG/ML IV SOLN
INTRAVENOUS | Status: AC
  Filled 2020-11-18: qty 449.93

## 2020-11-18 MED ORDER — SODIUM CHLORIDE FLUSH 0.9 % IV SOLN
50.0000 mL | Freq: Three times a day (TID) | INTRAVENOUS | Status: DC
  Administered 2020-11-18 – 2020-11-24 (×19): 50 mL
  Filled 2020-11-18 (×4): qty 51
  Filled 2020-11-18 (×2): qty 50
  Filled 2020-11-18: qty 51

## 2020-11-18 MED ORDER — SODIUM CHLORIDE 0.9% FLUSH
10.0000 mL | Freq: Two times a day (BID) | INTRAVENOUS | Status: DC
  Administered 2020-11-18 – 2020-11-27 (×9): 10 mL

## 2020-11-18 MED ORDER — SODIUM CHLORIDE 0.9% FLUSH
50.0000 mL | Freq: Three times a day (TID) | INTRAVENOUS | Status: DC
  Administered 2020-11-18 – 2020-11-27 (×26): 50 mL
  Filled 2020-11-18 (×2): qty 50
  Filled 2020-11-18 (×2): qty 51
  Filled 2020-11-18: qty 50
  Filled 2020-11-18: qty 51
  Filled 2020-11-18 (×6): qty 50
  Filled 2020-11-18 (×2): qty 51
  Filled 2020-11-18: qty 50

## 2020-11-18 MED ORDER — SODIUM CHLORIDE 0.9% FLUSH: 10.0000 mL | INTRAVENOUS | Status: DC | PRN

## 2020-11-18 MED FILL — Medication: Qty: 1 | Status: AC

## 2020-11-18 NOTE — Progress Notes (Addendum)
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  Assessment:  [name] presented on 9/8 with abdominal pain and AMS found to have necrotizing pancreatitis s/p ex-lap on 9/9 and debridement.  Patient was on TPN and then transitioned to TF.  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  He is s/p surgical drain upsized.  Pharmacy consulted to resume TPN since TF is on hold on 10/4.  Glucose / Insulin: no hx DM, A1c 6.8%  BG <180 requiring 16 units SS in last 24 hr Prior to NPO with TF held, was requiring high amount of daily insulin (TDD ~80 units) Electrolytes: lytes WNL (Phos high normal) Renal: Scr 0.6, stable.  BUN WNL. 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 GI Surgeries / Procedures:  9/9 Ex lap and pancreatic debridement  9/26 IR placed 5 surgical abdominal drains, 2 removed by patient later 10/5 IR surgical drains upsized  Central access: PICC 11/13/20, another PICC to be placed TPN start date: 9/10 >> 9/24, restarted 11/18/20  Nutritional Goals, RD Estimated Needs Total Energy Estimated Needs: 2350-2550 Total Protein Estimated Needs: 125-145 grams Total Fluid Estimated Needs: >/= 2.0 L  Current Nutrition:  NPO  Plan:  Start concentrated TPN at 50mL/hr at 1800 (goal rate 80 ml/hr) TPN will provide 67g AA,  167g  CHO and 36g ILE for a total of 1198 kCal, meeting ~50% of patient's needs Electrolytes in TPN: Na 79mEq/L, K 52mEq/L, Ca 67mEq/L, Mg 19mEq/L, Phos 29mmol/L. Cl:Ac 1:1 Add standard MVI and trace elements to TPN.  Add thiamine/folate for hx EtOH use. Continue rSSI Q4H and add 10 units regular insulin in TPN Standard TPN labs on Mon/Thurs, labs in AM  Thank you for allowing pharmacy to participate in this patient's care.  18m, PharmD PGY1 Acute Care Resident  11/18/2020,12:51 PM

## 2020-11-18 NOTE — Progress Notes (Signed)
PCCM Update: I met with patient's wife Paul Chambers in person along with patient's sister Paul Chambers who was on speaker phone.  We discussed the current status of Paul Chambers and his ongoing issues with the necrotizing pancreatitis.  We discussed that he remains intubated due to the ongoing process in his abdomen.  I informed them that his abdomen appeared better today and we would see if he tolerated a spontaneous breathing trial to move towards extubation.  They are in agreement that if he develops respiratory failure or distress in the future to move forward with intubation or noninvasive ventilation.  If he were to have cardiac arrest they would not want resuscitative care in the form of chest compressions, shocks or medication.  We discussed we would move forward with aggressive medical management at this time along with involvement from our surgical and interventional radiology colleagues regarding his abdominal drains.  We discussed that he would need a definitive surgery in the future involving removal of some of his colon as recommended by surgery.  It is unknown if his condition will improve to have the surgery done, but for now they would like to pursue aggressive medical care in order to achieve this goal.  Should things change in his condition and he were to decline they would be more in line with a full DNR and likely transition to comfort measures at that time.  Freda Jackson, MD [facility] Office: [phone]   See Amion for personal pager PCCM on call pager [phone] until 7pm. Please call Elink 7p-7a. [phone]

## 2020-11-18 NOTE — Progress Notes (Signed)
Pt placed back on full vent support due to increased RR and increased WOB. Pt tolerating well at this time, RN aware, RT will continue to monitor.

## 2020-11-18 NOTE — Progress Notes (Signed)
[facility]    Date of Admission:  10/15/2020           ID: Paul Chambers is a 55 y.o. 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 More alert.  Medications:   arformoterol  15 mcg Nebulization BID   chlorhexidine gluconate (MEDLINE KIT)  15 mL Mouth Rinse BID   Chlorhexidine Gluconate Cloth  6 each Topical Q0600   heparin  5,000 Units Subcutaneous Q8H   insulin aspart  0-20 Units Subcutaneous Q4H   [START ON 11/19/2020] lactulose  300 mL Rectal Daily   mouth rinse  15 mL Mouth Rinse 10 times per day   pantoprazole (PROTONIX) IV  40 mg Intravenous Q24H   revefenacin  175 mcg Nebulization Daily   sodium chloride flush  10-40 mL Intracatheter Q12H   sodium chloride flush  50 mL Intracatheter Q8H   sodium chloride flush  50 mL Intracatheter Q8H   sodium chloride flush  50 mL Intracatheter Q8H    Objective: Vital signs in last 24 hours: Temp:  [98.1 F (36.7 C)-100 F (37.8 C)] 99 F (37.2 C) (10/06 1144) Pulse Rate:  [49-114] 69 (10/06 1430) Resp:  [13-30] 22 (10/06 1430) BP: (96-147)/(48-102) 102/60 (10/06 1430) SpO2:  [78 %-100 %] 98 % (10/06 1430) FiO2 (%):  [40 %] 40 % (10/06 1400) Weight:  [99.7 kg] 99.7 kg (10/06 0500) Physical Exam  Constitutional: He is oriented to person. He appears chronically illand well-nourished. No distress.  HENT: OETT in place Mouth/Throat: Oropharynx is clear and moist. No oropharyngeal exudate.  Cardiovascular: Normal rate,  regular rhythm and normal heart sounds. Exam reveals no gallop and no friction rub.  No murmur heard.  Pulmonary/Chest: Effort normal and breath sounds normal. No respiratory distress. He has no wheezes.  Abdominal: Soft. Bowel sounds are decreased. Mild distension. There is no tenderness. Surgical incision is wrapped. 3 drains in place with purulent/feculant material Ext: + 1 edema Skin: Skin is warm and dry. No rash noted. No erythema.     Lab Results Recent Labs    11/17/20 0445 11/18/20 0549  WBC 17.8* 11.7*  HGB 8.3* 7.5*  HCT 27.1* 24.5*  NA 138 141  K 3.6 3.8  CL 103 106  CO2 27 27  BUN 16 15  CREATININE 0.67 0.66   Liver Panel Recent Labs    11/18/20 0549  PROT 5.6*  ALBUMIN <1.5*  AST 20  ALT 19  ALKPHOS 82  BILITOT 0.5   Sedimentation Rate No results for input(s): ESRSEDRATE in the last 72 hours. C-Reactive Protein No results for input(s): CRP in the last 72 hours.  Microbiology: reviewed Studies/Results: IR Catheter Tube Change  Result Date: 11/17/2020 INDICATION: 55 year old gentleman with necrotizing pancreatitis, sepsis and bowel perforation status post placement of drainage catheters in the right upper quadrant, right lower quadrant, and left lower quadrants. Most recent CT from 11/16/2020 demonstrates persistent collections near the right and left lower quadrant drains with increasing size of left retroperitoneal abscess. Interval improvement  of right upper quadrant fluid collection. EXAM: 1. Exchange and upsize of right lower quadrant drain from 14 [place] to 8 [place]. 2. Exchange and up size of left lower quadrant drain from 14 [place] to 20 [place]. MEDICATIONS: The patient is currently admitted to the hospital and receiving intravenous antibiotics. The antibiotics were administered within an appropriate time frame prior to the initiation of the procedure. ANESTHESIA/SEDATION: Fentanyl 3 mcg IV; Versed 100 mg IV Moderate Sedation Time:  16 minutes The  patient was continuously monitored during the procedure by the interventional radiology nurse under my direct supervision. COMPLICATIONS: None immediate. PROCEDURE: Informed written consent was obtained from the patient's wife after a thorough discussion of the procedural risks, benefits and alternatives. All questions were addressed. Maximal Sterile Barrier Technique was utilized including caps, mask, sterile gowns, sterile gloves, sterile drape, hand hygiene and skin antiseptic. A timeout was performed prior to the initiation of the procedure. Patient positioned supine on the procedure table. The external segment of the right and left lower quadrant drains and surrounding skin prepped and draped in usual fashion. Contrast administered through the right lower quadrant drain under fluoroscopy showed appropriate positioning within the collection. This 20 French drain was cut and removed over a guidewire and replaced with a 16 [place] Thal drain. Contrast administered through the new drain confirmed appropriate positioning within the collection. Contrast administered through the 14 French left lower quadrant drain showed appropriate positioning. This drain was cut and removed over a guidewire and replaced with a 20 [place] Thal drain. Approximately 70 mL of purulent appearing material was drained. Both drains were connected to bulb suction, secured to skin with suture and covered with sterile dressing. IMPRESSION: 1. Right lower quadrant 14 [place] multipurpose pigtail drain exchange for 16 [place] Thal drain. 2. Left lower quadrant 14 [place] multipurpose pigtail drain exchanged for 20 [place] Thal drain. 3. These are the largest available drains in the IR department. 4. 70 mL purulent output immediately following upsized to 20 French left lower quadrant drain. 5. No significant increased output from the right lower quadrant drain following exchange. The persistent collections within the abdomen are most likely thick  necrotic fat which is unlikely to drain from these catheters. Electronically Signed   By: Miachel Roux M.D.   On: 11/17/2020 14:38   IR Catheter Tube Change  Result Date: 11/17/2020 INDICATION: 55 year old gentleman with necrotizing pancreatitis, sepsis and bowel perforation status post placement of drainage catheters in the right upper quadrant, right lower quadrant, and left lower quadrants. Most recent CT from 11/16/2020 demonstrates persistent collections near the right and left lower quadrant drains with increasing size of left retroperitoneal abscess. Interval improvement of right upper quadrant fluid collection. EXAM: 1. Exchange and upsize of right lower quadrant drain from 14 [place] to 39 [place]. 2. Exchange and up size of left lower quadrant drain from 14 [place] to 20 [place]. MEDICATIONS: The patient is currently admitted to the hospital and receiving intravenous antibiotics. The antibiotics were administered within an appropriate time frame prior to the initiation of the procedure. ANESTHESIA/SEDATION: Fentanyl 3 mcg IV; Versed 100 mg IV Moderate Sedation Time:  16 minutes The patient was continuously monitored during the procedure by the interventional radiology nurse under my direct supervision. COMPLICATIONS: None immediate. PROCEDURE: Informed written consent was obtained from the patient's wife after a thorough discussion of the procedural risks, benefits and alternatives. All questions were addressed. Maximal Sterile Barrier Technique was utilized including caps, mask, sterile gowns, sterile gloves, sterile drape, hand  hygiene and skin antiseptic. A timeout was performed prior to the initiation of the procedure. Patient positioned supine on the procedure table. The external segment of the right and left lower quadrant drains and surrounding skin prepped and draped in usual fashion. Contrast administered through the right lower quadrant drain under fluoroscopy showed appropriate positioning  within the collection. This 1 French drain was cut and removed over a guidewire and replaced with a 16 [place] Thal drain. Contrast administered through the new drain confirmed appropriate positioning within the collection. Contrast administered through the 14 French left lower quadrant drain showed appropriate positioning. This drain was cut and removed over a guidewire and replaced with a 20 [place] Thal drain. Approximately 70 mL of purulent appearing material was drained. Both drains were connected to bulb suction, secured to skin with suture and covered with sterile dressing. IMPRESSION: 1. Right lower quadrant 14 [place] multipurpose pigtail drain exchange for 16 [place] Thal drain. 2. Left lower quadrant 14 [place] multipurpose pigtail drain exchanged for 20 [place] Thal drain. 3. These are the largest available drains in the IR department. 4. 70 mL purulent output immediately following upsized to 20 French left lower quadrant drain. 5. No significant increased output from the right lower quadrant drain following exchange. The persistent collections within the abdomen are most likely thick necrotic fat which is unlikely to drain from these catheters. Electronically Signed   By: Miachel Roux M.D.   On: 11/17/2020 14:38   [place] EKG SITE RITE  Result Date: 11/18/2020 If Site Rite image not attached, placement could not be confirmed due to current cardiac rhythm.    Assessment/Plan: Necrotizing pancreatitis with colonic fistula and ongoing intra-abdominal infection = will continue on linezolid, piptazo and anidulafungin for broad coverage. Decrease in WBC likely from drain upsized in order to get source control as well.  Hx of stenotrophomonas colonization = will continue to monitor. Not treating at this point  Respiratory failure = slowly weaning from vent  Family meeting since overall condition still guarded  [facility] Pager: [phone]  11/18/2020, 2:53  PM

## 2020-11-18 NOTE — Progress Notes (Signed)
OT Cancellation Note  Patient Details Name: Paul Chambers MRN: [id number] DOB: 1965-09-08   Cancelled Treatment:    Reason Eval/Treat Not Completed: Patient at procedure or test/ unavailable.  Undergoing a bedside procedure.  Continue efforts as appropriate.    Mattheo Swindle D Chanell Nadeau 11/18/2020, 5:11 PM

## 2020-11-18 NOTE — Progress Notes (Signed)
[facility] consult received to obtain midline access. Patient on pressors and multiple medications with limited venous access. TPN to restart 10/6. Spoke with patient's nurse, Shanda Bumps who stated NP canceled midline order. Physician requesting second PICC placement. Princella Pellegrini that PICC team will be in touch later today to discuss.

## 2020-11-18 NOTE — Progress Notes (Signed)
Referring Physician(s): * No referring provider recorded for this case *  Supervising Physician: Mir, Mauri Reading  Patient Status:  [facility] [place] - In-pt  Chief Complaint:  Exchange and upsize of RLQ and LLQ abscess drains 11/17/2020.  Subjective:  Patient lying in bed currently intubated.  Vital signs are stable.   -Right upper quadrant drain has 25 cc purulent drainage to JP.  Insertion site is red, sutures/StatLock intact.  Flushes easily.  No bleeding, drainage, swelling or other signs of infection noted.  70 cc drainage documented over last 24 hours. -Right lower quadrant drain has scant amount purulent drainage to JP.  Insertion site unremarkable with sutures/StatLock intact.  Flushes easily.  There is no bleeding, drainage, redness or other signs of infection.  Slight amount of yellowish drainage on dressing.  10 cc total drainage documented over past 24 hours. -Left lower quadrant has scant amount of purulent drainage to JP.  Flushes easily. Insertion site reddened with sutures/StatLock intact. No bleeding, drainage or other signs of infection.  20 cc drainage documented over past 24 hours.  Allergies: Patient has no known allergies.  Medications: Prior to Admission medications   Medication Sig Start Date End Date Taking? Authorizing Provider  acetaminophen (TYLENOL) 500 MG tablet Take 500 mg by mouth every 6 (six) hours as needed for mild pain.   Yes [provider]  albuterol (VENTOLIN HFA) 108 (90 Base) MCG/ACT inhaler Inhale 1-2 puffs into the lungs every 6 (six) hours as needed for wheezing or shortness of breath.   Yes [provider]  allopurinol (ZYLOPRIM) 300 MG tablet Take 300 mg by mouth daily.   Yes [provider]  ALPRAZolam Prudy Feeler) 0.5 MG tablet Take 0.5 mg by mouth 3 (three) times daily as needed for anxiety.   Yes [provider]  amLODipine (NORVASC) 5 MG tablet Take 5 mg by mouth daily.   Yes [provider]   Fluticasone-Umeclidin-Vilant (TRELEGY ELLIPTA) 100-62.5-25 MCG/INH AEPB Inhale 1 puff into the lungs daily.   Yes [provider]  folic acid (FOLVITE) 1 MG tablet Take 1 mg by mouth daily.   Yes [provider]  furosemide (LASIX) 40 MG tablet Take 40 mg by mouth 2 (two) times daily.   Yes [provider]  Multiple Vitamins-Minerals (PRESERVISION AREDS 2+MULTI VIT PO) Take 2 tablets by mouth daily.   Yes [provider]  Omega-3 Krill Oil 500 MG CAPS Take 1,000 mg by mouth daily.   Yes [provider]  omeprazole (PRILOSEC) 40 MG capsule Take 40 mg by mouth daily.   Yes [provider]  oxyCODONE (OXY IR/ROXICODONE) 5 MG immediate release tablet Take 2.5-5 mg by mouth every 6 (six) hours as needed for severe pain.   Yes [provider]  thiamine (VITAMIN B-1) 100 MG tablet Take 100 mg by mouth daily.   Yes [provider]     Vital Signs: BP 125/80 (BP Location: Left Arm)   Pulse (!) 51   Temp 98.1 F (36.7 C) (Axillary)   Resp (!) 30   Ht 5\' 9"  (1.753 m)   Wt 219 lb 12.8 oz (99.7 kg)   SpO2 100%   BMI 32.46 kg/m   Physical Exam Vitals reviewed.  Constitutional:      Appearance: He is ill-appearing.  HENT:     Head: Normocephalic and atraumatic.  Cardiovascular:     Rate and Rhythm: Normal rate and regular rhythm.  Pulmonary:     Effort: Pulmonary effort is  normal.     Comments: Patient on mechanical ventilation. Abdominal:     General: There is distension.     Comments: Right upper quadrant, right lower quadrant, left lower quadrant drains in place 2 JP drains.  Musculoskeletal:        General: Swelling present.     Right lower leg: Edema present.     Left lower leg: Edema present.     Comments: Patient has generalized edema  Skin:    General: Skin is warm and dry.    Imaging: CT ABDOMEN PELVIS W CONTRAST  Result Date: 11/16/2020 CLINICAL DATA:  A 55 year old male with signs of sepsis and  bowel perforation suspected. EXAM: CT ABDOMEN AND PELVIS WITH CONTRAST TECHNIQUE: Multidetector CT imaging of the abdomen and pelvis was performed using the standard protocol following bolus administration of intravenous contrast. CONTRAST:  80mL OMNIPAQUE IOHEXOL 350 MG/ML SOLN COMPARISON:  Multiple prior studies most recent comparison from November 13, 2020. FINDINGS: Lower chest: Catheter for venous access terminates at the caval to atrial junction. The central pulmonary vasculature with engorgement of main pulmonary artery to 3.8 cm, partially visualized. Dense basilar consolidation and LEFT-sided effusion is similar to the recent CT of the chest, abdomen and pelvis as is a RIGHT effusion with RIGHT basilar airspace disease. Hepatobiliary: Perihepatic fluid diminished since previous imaging. Drain remains in place entering via RIGHT flank approach between ribs 8 and 9. No focal, suspicious hepatic lesion. Gallbladder is contracted. No gross biliary duct distension. Pancreas: Signs of pancreatic necrosis with large necrotic collection in the anterior pararenal space tracking into the transverse mesocolon where there is evidence of frank colonic communication best seen on sagittal image 108/7. A small amount of ingested contrast media is seen in the dependent aspect of the necrotic collection in this location. This communicates with the perihepatic fluid as well and with necrotic debris in the LEFT retroperitoneum. A drain entering via LEFT flank approach terminates in the anterior pararenal space in the LEFT retroperitoneum. Collection in the LEFT retroperitoneum measuring 11.1 x 5.0 cm previously 12.0 x 4.7 cm. Collection in the transverse mesocolon 5.6 x 4.3 cm (image 50/3) previously 6.2 x 4.4 cm. Drain in the RIGHT anterior pararenal space is similar. Lower extension of LEFT flank fluid into the LEFT iliac fossa measures 4.9 x 4.2 cm as compared to 5.3 x 4.9 cm (image 69/3) Signs of diffuse peritoneal  inflammation and omental stranding. Perienteric fluid tracking in the anterior abdomen. Tract extending through the abdominal wall on the RIGHT in the mid abdomen communicates with necrotic debris in the transverse mesocolon. Spleen: Unremarkable currently. Adrenals/Urinary Tract: Adrenal glands normal. Symmetric renal enhancement. No hydronephrosis. Collapsed urinary bladder. Stomach/Bowel: Feeding tube in the proximal jejunum beyond the ligament of Treitz. Signs of mesenteric inflammation. Bowel wall thickening throughout the small bowel in the abdomen. Colonic communication with walled-off necrosis in the retroperitoneum and pancreatic bed. Vascular/Lymphatic: Aortic atherosclerosis of the abdominal aorta without aneurysm. Smooth contour of the IVC. Narrowing of the splenic vein as it passes through the necrotic collection in the retroperitoneum. There is no gastrohepatic or hepatoduodenal ligament lymphadenopathy. No retroperitoneal or mesenteric lymphadenopathy. No pelvic sidewall lymphadenopathy. Reproductive: Unremarkable. Other: Diffuse inflammation throughout the abdomen. Ascites and interloop fluid with similar volume. Fluid about the liver is diminished in volume compared to the previous study, drain remaining in place, other surgical drains and collections as outlined above. There is also stranding and fat necrosis as well as inflammation tracking into the RIGHT iliac fossa  less than on the LEFT. Musculoskeletal: No acute bone finding. No destructive bone process. Spinal degenerative changes. IMPRESSION: Homero Fellers colonic communication along the transverse colon into the mesocolic portion of the necrotic collection with extensive debris and fluid throughout the retroperitoneum in the setting of necrotic pancreatitis. Sequela of necrotic pancreatitis extends into the RIGHT upper quadrant about the liver, below the LEFT hemidiaphragm and throughout the LEFT and RIGHT retroperitoneum into the pelvis on the LEFT  greater than RIGHT. Numerous drains in place still with considerable debris in fluid in the abdomen though with diminished amount of fluid and gas along the liver when compared to previous imaging. Persistent basilar airspace disease and effusions LEFT worse than RIGHT. Consider the possibility of developing infection at the LEFT lung base. Tract extending through the abdominal wall on the RIGHT in the mid abdomen communicates with necrotic debris in the transverse mesocolon. Signs of diffuse peritoneal inflammation and omental stranding. Bowel wall thickening throughout small bowel in the abdomen likely related to diffuse enteritis in the setting diffuse peritoneal inflammation. Findings of bowel communication with necrotic collection were called to the provider caring for the patient at the time of dictation. These results were called by telephone at the time of interpretation on 11/16/2020 at 2:23 pm to provider [facility] , who verbally acknowledged these results. Electronically Signed   By: Donzetta Kohut M.D.   On: 11/16/2020 14:24   IR Catheter Tube Change  Result Date: 11/17/2020 INDICATION: 55 year old gentleman with necrotizing pancreatitis, sepsis and bowel perforation status post placement of drainage catheters in the right upper quadrant, right lower quadrant, and left lower quadrants. Most recent CT from 11/16/2020 demonstrates persistent collections near the right and left lower quadrant drains with increasing size of left retroperitoneal abscess. Interval improvement of right upper quadrant fluid collection. EXAM: 1. Exchange and upsize of right lower quadrant drain from 14 [place] to 63 [place]. 2. Exchange and up size of left lower quadrant drain from 14 [place] to 20 [place]. MEDICATIONS: The patient is currently admitted to the hospital and receiving intravenous antibiotics. The antibiotics were administered within an appropriate time frame prior to the initiation of the procedure.  ANESTHESIA/SEDATION: Fentanyl 3 mcg IV; Versed 100 mg IV Moderate Sedation Time:  16 minutes The patient was continuously monitored during the procedure by the interventional radiology nurse under my direct supervision. COMPLICATIONS: None immediate. PROCEDURE: Informed written consent was obtained from the patient's wife after a thorough discussion of the procedural risks, benefits and alternatives. All questions were addressed. Maximal Sterile Barrier Technique was utilized including caps, mask, sterile gowns, sterile gloves, sterile drape, hand hygiene and skin antiseptic. A timeout was performed prior to the initiation of the procedure. Patient positioned supine on the procedure table. The external segment of the right and left lower quadrant drains and surrounding skin prepped and draped in usual fashion. Contrast administered through the right lower quadrant drain under fluoroscopy showed appropriate positioning within the collection. This 49 French drain was cut and removed over a guidewire and replaced with a 16 [place] Thal drain. Contrast administered through the new drain confirmed appropriate positioning within the collection. Contrast administered through the 14 French left lower quadrant drain showed appropriate positioning. This drain was cut and removed over a guidewire and replaced with a 20 [place] Thal drain. Approximately 70 mL of purulent appearing material was drained. Both drains were connected to bulb suction, secured to skin with suture and covered with sterile dressing. IMPRESSION: 1. Right lower quadrant 14 [place]  multipurpose pigtail drain exchange for 16 [place] Thal drain. 2. Left lower quadrant 14 [place] multipurpose pigtail drain exchanged for 20 [place] Thal drain. 3. These are the largest available drains in the IR department. 4. 70 mL purulent output immediately following upsized to 20 French left lower quadrant drain. 5. No significant increased output from the right lower quadrant  drain following exchange. The persistent collections within the abdomen are most likely thick necrotic fat which is unlikely to drain from these catheters. Electronically Signed   By: Acquanetta Belling M.D.   On: 11/17/2020 14:38   IR Catheter Tube Change  Result Date: 11/17/2020 INDICATION: 54 year old gentleman with necrotizing pancreatitis, sepsis and bowel perforation status post placement of drainage catheters in the right upper quadrant, right lower quadrant, and left lower quadrants. Most recent CT from 11/16/2020 demonstrates persistent collections near the right and left lower quadrant drains with increasing size of left retroperitoneal abscess. Interval improvement of right upper quadrant fluid collection. EXAM: 1. Exchange and upsize of right lower quadrant drain from 14 [place] to 78 [place]. 2. Exchange and up size of left lower quadrant drain from 14 [place] to 20 [place]. MEDICATIONS: The patient is currently admitted to the hospital and receiving intravenous antibiotics. The antibiotics were administered within an appropriate time frame prior to the initiation of the procedure. ANESTHESIA/SEDATION: Fentanyl 3 mcg IV; Versed 100 mg IV Moderate Sedation Time:  16 minutes The patient was continuously monitored during the procedure by the interventional radiology nurse under my direct supervision. COMPLICATIONS: None immediate. PROCEDURE: Informed written consent was obtained from the patient's wife after a thorough discussion of the procedural risks, benefits and alternatives. All questions were addressed. Maximal Sterile Barrier Technique was utilized including caps, mask, sterile gowns, sterile gloves, sterile drape, hand hygiene and skin antiseptic. A timeout was performed prior to the initiation of the procedure. Patient positioned supine on the procedure table. The external segment of the right and left lower quadrant drains and surrounding skin prepped and draped in usual fashion. Contrast administered  through the right lower quadrant drain under fluoroscopy showed appropriate positioning within the collection. This 62 French drain was cut and removed over a guidewire and replaced with a 16 [place] Thal drain. Contrast administered through the new drain confirmed appropriate positioning within the collection. Contrast administered through the 14 French left lower quadrant drain showed appropriate positioning. This drain was cut and removed over a guidewire and replaced with a 20 [place] Thal drain. Approximately 70 mL of purulent appearing material was drained. Both drains were connected to bulb suction, secured to skin with suture and covered with sterile dressing. IMPRESSION: 1. Right lower quadrant 14 [place] multipurpose pigtail drain exchange for 16 [place] Thal drain. 2. Left lower quadrant 14 [place] multipurpose pigtail drain exchanged for 20 [place] Thal drain. 3. These are the largest available drains in the IR department. 4. 70 mL purulent output immediately following upsized to 20 French left lower quadrant drain. 5. No significant increased output from the right lower quadrant drain following exchange. The persistent collections within the abdomen are most likely thick necrotic fat which is unlikely to drain from these catheters. Electronically Signed   By: Acquanetta Belling M.D.   On: 11/17/2020 14:38   DG CHEST PORT 1 VIEW  Result Date: 11/16/2020 CLINICAL DATA:  Respiratory failure EXAM: PORTABLE CHEST 1 VIEW COMPARISON:  11/14/2020 FINDINGS: Endotracheal tube and feeding catheter are noted in satisfactory position. Cardiac shadow is stable. Persistent right basilar airspace opacity is noted  although the right-sided effusion is decreased when compare with the prior exam secondary to drainage catheter. Small left-sided pleural effusion is noted. IMPRESSION: Improved aeration in the right lung base although persistent opacity is seen. Bilateral small effusions improved on the right. Electronically Signed    By: Alcide Clever M.D.   On: 11/16/2020 03:46   [place] EKG SITE RITE  Result Date: 11/18/2020 If Site Rite image not attached, placement could not be confirmed due to current cardiac rhythm.   Labs:  CBC: Recent Labs    11/15/20 1019 11/16/20 0339 11/16/20 0412 11/17/20 0445 11/18/20 0549  WBC 16.4* 17.9*  --  17.8* 11.7*  HGB 8.0* 7.7* 8.2* 8.3* 7.5*  HCT 26.9* 26.5* 24.0* 27.1* 24.5*  PLT 263 247  --  231 200    COAGS: Recent Labs    11/04/2020 2102 10/22/20 0357 10/22/20 0826  INR 2.1* 2.1* 2.0*    BMP: Recent Labs    11/15/20 0455 11/16/20 0339 11/16/20 0412 11/17/20 0445 11/18/20 0549  NA 142 140 143 138 141  K 3.7 4.0 3.9 3.6 3.8  CL 106 103  --  103 106  CO2 29 27  --  27 27  GLUCOSE 148* 208*  --  136* 132*  BUN 15 17  --  16 15  CALCIUM 8.3* 8.1*  --  8.0* 7.9*  CREATININE 0.58* 0.68  --  0.67 0.66  GFRNONAA >60 >60  --  >60 >60    LIVER FUNCTION TESTS: Recent Labs    11/13/20 0054 11/14/20 0508 11/14/20 0510 11/15/20 0455 11/18/20 0549  BILITOT 0.7  --  0.7 0.6 0.5  AST 22  --  23 19 20   ALT 15  --  14 15 19   ALKPHOS 90  --  86 73 82  PROT 5.8*  --  6.1* 6.0* 5.6*  ALBUMIN 1.6* 1.6* 1.7* 1.7* <1.5*    Assessment and Plan:   Patient lying in bed currently intubated.  Vital signs are stable.  -Right upper quadrant drain has 25 cc purulent drainage to JP.  Insertion site is red, sutures/StatLock intact.  Flushes easily.  No bleeding, drainage, swelling or other signs of infection noted.  70 cc drainage documented over last 24 hours. -Right lower quadrant drain has scant amount purulent drainage to JP.  Insertion site unremarkable with sutures/StatLock intact.  Flushes easily.  There is no bleeding, drainage, redness or other signs of infection.  Slight amount of yellowish drainage on dressing.  10 cc total drainage documented over past 24 hours. -Left lower quadrant has scant amount of purulent drainage to JP.  Flushes easily. Insertion site  reddened with sutures/StatLock intact. No bleeding, drainage or other signs of infection.  20 cc drainage documented over past 24 hours.  WBCs down today 11.7 from 17.8 yesterday  Continue 3 times daily flushes of each drain with 5 cc normal saline. Record output every shift. Dressing changes daily or as needed if soiled. Call IR APP or on-call IR MD if difficulty flushing or sudden change in drain output. Repeat imaging/possible drain injection once output less than 10 mL/daily.  IR will continue to follow.  Please call with questions or concerns.  Electronically Signed: , NP 11/18/2020, 11:40 AM   I spent a total of 15 Minutes at the the patient's bedside AND on the patient's hospital floor or unit, greater than 50% of which was counseling/coordinating care for intra-abdominal drains x3.

## 2020-11-18 NOTE — Progress Notes (Addendum)
NAME:  Paul Chambers, MRN:  [id number], DOB:  06-Aug-1965, LOS: 28 ADMISSION DATE:  10/20/2020, CONSULTATION DATE:  11/14/2020 REFERRING MD:  Dr. Tereasa Coop, Reason for consult: Acute respiratory failure  History of Present Illness:  Patient was encephalopathic and/or intubated. Therefore history has been obtained from chart review.   Paul Chambers, is a 55 y.o. male, who was admitted to [facility] on 11/09/2020 with abdominal pain.  With a pertinent pmx of alcoholism, HTN, gout, pancreatitis, tobacco use disorder.  He was found to have pneumoperitoneum without clear perforation. He was started on vanc and zosyn. He underwent ex lap on 9/9 and was found to have a large amount of necrotic fluid surrounding his pancreas, which was concerning for necrotic infected pancreas, two drains were placed. No bowel perforation was noted.  His hospital course has been complicated by acute respiratory failure secondary to possible aspiration pneumonia, acute blood loss anemia requiring transfusion, delirium, severe malnutrition requiring TPN. Was weaned of TPN and TF was started. He continued to spike fevers and was tachycardic despite antibiotics. ID consulted on 9/24 and changed abx to IV zosyn. Repeat CT demonsdtated new perihepatic fluid. On 9/26 IR placed 3 new drains. Fluid cultures resulted with enterococcus and clostridium.  The early morning of 10/2 PCCM was called to bedside for O2 sats in the 70's with the patient not responding to noxious stimuli. The patient was emergently intubated. Shortly after the patient had a PEA arrest with 2 minutes of CPR until ROSC. 1 epi was given. The patient was transferred to ICU.  Pertinent  Medical History  alcoholism, HTN, gout, pancreatitis, tobacco abuse, cirrhosis   Significant Hospital Events: Including procedures, antibiotic start and stop dates in addition to other pertinent events   9/8 - Presented to [facility] with abdominal pain. CT A/P with extensive pneumoperitoneum 9/9 -  Ex-lap (no colonic perforation discovered, large necrotic fluid present surrounding pancreas. Transferred to ICU post-op, intubated and sedated.  9/9-9/15 Fluconazole, 9/9-9/15 linezolid, zosyn 9/9-9/15, meropenem 9/15-9/19, levofloxacin 9/19-9/24, flagyl 9/19-9/24, zosyn 9/24-9/27, unasyn 9/27-9/30, zosyn 9/30-present, vancomycin 10/2-10/3, linezolid 10/3, Eraxis 10/4 9/10-received 1 unit PRBC for hemoglobin 6.7, went back on Levophed for short period of time 9/12 - Extubated to Venturi mask 9/13 - Off Fentanyl and Propofol gtt 9/15 Transferred to [facility] 9/24 IV Zosyn 9/26 CT a/p showed perihepatic fluid, IR Drains placed 10/2 PCCM consulted for respiratory failure, intubated, PEA arrest s/p intubation, 2 minutes CPR, Transferred to icu 10/4 CT A/P with colonic communication between transverse colon into mesocolon and necrotic collection, significant retroperitoneal collections 10/5 IR for exchange of drains   Interim History / Subjective:  Yesterday went to IR for up-sizing of drains. On 62mcg/min levophed this AM.   Objective   Blood pressure 114/76, pulse 61, temperature 100 F (37.8 C), temperature source Axillary, resp. rate (!) 30, height 5\' 9"  (1.753 m), weight 99.7 kg, SpO2 100 %.    Vent Mode: PRVC FiO2 (%):  [40 %-100 %] 40 % Set Rate:  [30 bmp] 30 bmp Vt Set:  [420 mL] 420 mL PEEP:  [5 cmH20-10 cmH20] 5 cmH20 Pressure Support:  [7 cmH20] 7 cmH20 Plateau Pressure:  [17 cmH20-27 cmH20] 17 cmH20   Intake/Output Summary (Last 24 hours) at 11/18/2020 0820 Last data filed at 11/18/2020 0600 Gross per 24 hour  Intake 1635.09 ml  Output 1265 ml  Net 370.09 ml   Filed Weights   11/11/20 0500 11/17/20 0500 11/18/20 0500  Weight: 98.3 kg 101.9 kg 99.7 kg  Examination: General: Critically ill adult male  HEENT: ETT, NG in place  Neuro: Sedated, follows simple commands, pupils 2 mm and reactive  CV: RRR, no mRG PULM:  coarse breath sounds, no crackles/wheeze  GI: distended,  hypoactive, JP drains in place, midline incision with gauze, drainage pouch to right lower with milky output  Extremities: generalized edema, PICC in place to right upper arm   Resolved Hospital Problem list   Transaminitis AKI  Assessment & Plan:   Acute respiratory failure with hypoxia Likely due to aspiration pneumonitis resulting in ALI with concomitant atelectasis 2/2 moderate bilateral pleural effusions which were seen on 10/1 CT chest  Previous colonization with steno Plan -Low tidal volume strategy with tidal volumes of 4-8 cc/kg ideal body weight -Goal plateau pressures less than 30 and driving pressures less than 15 -Wean PEEP/FiO2 for SpO2 92-98% -VAP bundle -Daily SAT and SBT -PAD bundle with Precedex gtt and fentanyl push -S/p lasix 40mg  IV x1 10/2, 10/3, 10/4 > hold further diuresis  -RASS goal 0 to -1 -ABX as discussed below  Septic shock secondary to infected necrotizing pancreatitis, possible aspiration pneumonia Intra abdominal abscess Colonic communication w/ necrotic collection  JP drain culture growth with rare enterococcus faecalis, clostridium WBC stable on AM labs, and patient afebrile ON but fever to 102.3 at 0701. In addition patient noted to have increasing pressor requirements with levophed up to 12/2.   Patient removed x2 JP drains on 10/1, x3 JP drains in place CT abdomen 10/1 with air-fluid collections throughout the abdomen. Air fluid collection near pancreas. Gallbladder wall edema.  CT A/p 10/4 showed colonic communication between transverse colon into mesocolon and necrotic collection, significant retroperitoneal collections.  10/5 wound culture +E.Coli Plan -ID following  -Continue zosyn, continue linezolid 10/3 AM, eraxis started 10/4 AM  -Goal MAP greater than 65. Wean levophed as able -General surgery following > Surgical site management and drain management per GS.  -Strict NPO. Start TPN    Acute metabolic encephalopathy > Improving   Suspect secondary to sepsis, hypoxia, and elevated ammonia level  Plan -Continue antimicrobials per above   Acute blood loss anemia  Plan -Transfuse PRBC if HBG less than 7 -Monitor for signs of bleeding  DM2 -Patient previously on novolog 8u q6h, semeglee 20u qd, +SSI.  Plan -Trend glucose  -SSI   Diarrhea Suspect secondary to TF + lactulose  Plan -continue FMS   Likely alcoholic cirrhosis  HX ETOH abuse -INR elevated, 2 on admission,  RUQ 12/4 10/2 with nodular contour and ascites.  Plan -Spironolactone 25mg  started 10/4 to help with pleural effusions and ascites > held due to ongoing shock and NPO status  -Thiamine, folate   Severe protein calorie malnutrition Plan -Start TPN  In hospital PEA arrest On 10/2. Suspect secondary to hypoxia/hypotension. Occurred shortly after intubation. 2 min cpr. 1 epi given RSI drugs limiting post ROSC neuro exam.  Best Practice (right click and "Reselect all SmartList Selections" daily)   Diet/type: NPO  DVT prophylaxis: prophylactic heparin  GI prophylaxis: PPI Lines: yes and it is still needed Foley:  Yes, and it is still needed Code Status:  full code Last date of multidisciplinary goals of care discussion: GOC discussion with spouse given poor prognosis planned for 1pm today.   Labs   CBC: Recent Labs  Lab 11/13/20 0054 11/14/20 0240 11/14/20 0650 11/15/20 1019 11/16/20 0339 11/16/20 0412 11/17/20 0445 11/18/20 0549  WBC 14.9*  --  22.6* 16.4* 17.9*  --  17.8* 11.7*  NEUTROABS 12.4*  --   --   --   --   --   --   --   HGB 8.6*   < > 8.7* 8.0* 7.7* 8.2* 8.3* 7.5*  HCT 28.3*   < > 29.4* 26.9* 26.5* 24.0* 27.1* 24.5*  MCV 102.9*  --  106.5* 106.3* 105.6*  --  101.5* 100.0  PLT 382  --  402* 263 247  --  231 200   < > = values in this interval not displayed.    Basic Metabolic Panel: Recent Labs  Lab 11/13/20 0054 11/14/20 0240 11/14/20 0508 11/14/20 0510 11/15/20 0455 11/16/20 0339 11/16/20 0412  11/17/20 0445 11/18/20 0549  NA 137   < > 141 139 142 140 143 138 141  K 3.5   < > 5.2* 4.2 3.7 4.0 3.9 3.6 3.8  CL 102  --  107 105 106 103  --  103 106  CO2 28  --  22 23 29 27   --  27 27  GLUCOSE 224*  --  154* 148* 148* 208*  --  136* 132*  BUN 10  --  12 11 15 17   --  16 15  CREATININE 0.57*  --  0.43* 0.53* 0.58* 0.68  --  0.67 0.66  CALCIUM 7.9*  --  8.4* 8.2* 8.3* 8.1*  --  8.0* 7.9*  MG 2.0  --   --  2.0 2.1 2.1  --  2.0 2.0  PHOS 2.1*  --  4.3  --  3.9  --   --   --  4.3   < > = values in this interval not displayed.   GFR: Estimated Creatinine Clearance: 125.7 mL/min (by C-G formula based on SCr of 0.66 mg/dL). Recent Labs  Lab 11/14/20 0507 11/14/20 0650 11/14/20 0936 11/15/20 1019 11/16/20 0339 11/17/20 0445 11/18/20 0549  WBC  --    < >  --  16.4* 17.9* 17.8* 11.7*  LATICACIDVEN 1.9  --  1.8  --   --   --   --    < > = values in this interval not displayed.    Liver Function Tests: Recent Labs  Lab 11/13/20 0054 11/14/20 0508 11/14/20 0510 11/15/20 0455 11/18/20 0549  AST 22  --  23 19 20   ALT 15  --  14 15 19   ALKPHOS 90  --  86 73 82  BILITOT 0.7  --  0.7 0.6 0.5  PROT 5.8*  --  6.1* 6.0* 5.6*  ALBUMIN 1.6* 1.6* 1.7* 1.7* <1.5*   Recent Labs  Lab 11/13/20 0054  LIPASE 29   Recent Labs  Lab 11/13/20 0054 11/14/20 0508 11/16/20 0339  AMMONIA 155* 73* 38*    ABG    Component Value Date/Time   PHART 7.424 11/16/2020 0412   PCO2ART 43.4 11/16/2020 0412   PO2ART 59 (L) 11/16/2020 0412   HCO3 28.1 (H) 11/16/2020 0412   TCO2 29 11/16/2020 0412   O2SAT 89.0 11/16/2020 0412      Coagulation Profile: No results for input(s): INR, PROTIME in the last 168 hours.  Cardiac Enzymes: No results for input(s): CKTOTAL, CKMB, CKMBINDEX, TROPONINI in the last 168 hours.   HbA1C: Hgb A1c MFr Bld  Date/Time Value Ref Range Status  11-11-20 10:13 PM 6.8 (H) 4.8 - 5.6 % Final    Comment:    (NOTE) Pre diabetes:           5.7%-6.4%  Diabetes:              >  6.4%  Glycemic control for   <7.0% adults with diabetes     CBG: Recent Labs  Lab 11/17/20 1623 11/17/20 1918 11/17/20 2348 11/18/20 0309 11/18/20 0721  GLUCAP 132* 120* 123* 136* 111*    Review of Systems:   Unable to obtain due to patient status.  Past Medical History:  He,  has a past medical history of Alcoholism (HCC), Gout, Hypertension, Pancreatitis, and Tobacco abuse.   Surgical History:   Past Surgical History:  Procedure Laterality Date   IR CATHETER TUBE CHANGE  11/17/2020   IR CATHETER TUBE CHANGE  11/17/2020   LAPAROSCOPY N/A 10/15/2020   Procedure: LAPAROSCOPY DIAGNOSTIC;  Surgeon: Sheliah Hatch De Blanch, MD;  Location: [facility] OR;  Service: General;  Laterality: N/A;   LAPAROTOMY N/A 10/16/2020   Procedure: EXPLORATORY LAPAROTOMY AND PANCREATIC DEBRIDEMENT;  Surgeon: Sheliah Hatch, De Blanch, MD;  Location: [facility] OR;  Service: General;  Laterality: N/A;     Social History:   reports that he has been smoking cigarettes. He does not have any smokeless tobacco history on file. He reports that he does not currently use alcohol.   Family History:  His family history includes Diverticulitis in his mother; Liver cancer in his maternal aunt.   Allergies No Known Allergies   Home Medications  Prior to Admission medications   Medication Sig Start Date End Date Taking? Authorizing Provider  acetaminophen (TYLENOL) 500 MG tablet Take 500 mg by mouth every 6 (six) hours as needed for mild pain.   Yes [provider]  albuterol (VENTOLIN HFA) 108 (90 Base) MCG/ACT inhaler Inhale 1-2 puffs into the lungs every 6 (six) hours as needed for wheezing or shortness of breath.   Yes [provider]  allopurinol (ZYLOPRIM) 300 MG tablet Take 300 mg by mouth daily.   Yes [provider]  ALPRAZolam Prudy Feeler) 0.5 MG tablet Take 0.5 mg by mouth 3 (three) times daily as needed for anxiety.   Yes [provider]  amLODipine  (NORVASC) 5 MG tablet Take 5 mg by mouth daily.   Yes [provider]  Fluticasone-Umeclidin-Vilant (TRELEGY ELLIPTA) 100-62.5-25 MCG/INH AEPB Inhale 1 puff into the lungs daily.   Yes [provider]  folic acid (FOLVITE) 1 MG tablet Take 1 mg by mouth daily.   Yes [provider]  furosemide (LASIX) 40 MG tablet Take 40 mg by mouth 2 (two) times daily.   Yes [provider]  Multiple Vitamins-Minerals (PRESERVISION AREDS 2+MULTI VIT PO) Take 2 tablets by mouth daily.   Yes [provider]  Omega-3 Krill Oil 500 MG CAPS Take 1,000 mg by mouth daily.   Yes [provider]  omeprazole (PRILOSEC) 40 MG capsule Take 40 mg by mouth daily.   Yes [provider]  oxyCODONE (OXY IR/ROXICODONE) 5 MG immediate release tablet Take 2.5-5 mg by mouth every 6 (six) hours as needed for severe pain.   Yes [provider]  thiamine (VITAMIN B-1) 100 MG tablet Take 100 mg by mouth daily.   Yes [provider]   CRITICAL CARE Performed by: Tobey Grim   Total critical care time: 32 minutes  Critical care time was exclusive of separately billable procedures and treating other patients.  Critical care was necessary to treat or prevent imminent or life-threatening deterioration.  Critical care was time spent personally by me on the following activities: development of treatment plan with patient and/or surrogate as well as nursing, discussions with consultants, evaluation of patient's response to treatment,  examination of patient, obtaining history from patient or surrogate, ordering and performing treatments and interventions, ordering and review of laboratory studies, ordering and review of radiographic studies, pulse oximetry and re-evaluation of patient's condition.   Jovita Kussmaul, AGACNP-BC [facility]  PCCM Pgr: [phone]

## 2020-11-18 NOTE — Progress Notes (Signed)
Peripherally Inserted Central Catheter Placement  The IV Nurse has discussed with the patient and/or persons authorized to consent for the patient, the purpose of this procedure and the potential benefits and risks involved with this procedure.  The benefits include less needle sticks, lab draws from the catheter, and the patient may be discharged home with the catheter. Risks include, but not limited to, infection, bleeding, blood clot (thrombus formation), and puncture of an artery; nerve damage and irregular heartbeat and possibility to perform a PICC exchange if needed/ordered by physician.  Alternatives to this procedure were also discussed.  Bard Power PICC patient education guide, fact sheet on infection prevention and patient information card has been provided to patient /or left at bedside.  Previous consent signed on 10-1 by Wife due to altered mental status.  PICC exchanged to TL.  PICC Placement Documentation  PICC Triple Lumen 11/18/20 PICC Right  43 cm 0 cm (Active)  Indication for Insertion or Continuance of Line Administration of hyperosmolar/irritating solutions (i.e. TPN, Vancomycin, etc.);Vasoactive infusions;Prolonged intravenous therapies 11/18/20 1602  Exposed Catheter (cm) 0 cm 11/18/20 1602  Site Assessment Clean;Dry;Intact 11/18/20 1602  Lumen #1 Status Flushed;Saline locked;Blood return noted 11/18/20 1602  Lumen #2 Status Flushed;Saline locked;Blood return noted 11/18/20 1602  Lumen #3 Status Flushed;Saline locked;Blood return noted 11/18/20 1602  Dressing Type Transparent 11/18/20 1602  Dressing Status Clean;Dry;Intact 11/18/20 1602  Antimicrobial disc in place? Yes 11/18/20 1602  Safety Lock Not Applicable 11/18/20 1602  Line Care Connections checked and tightened 11/18/20 1602  Line Adjustment (NICU/IV Team Only) No 11/18/20 1602  Dressing Intervention New dressing 11/18/20 1602  Dressing Change Due 11/25/20 11/18/20 1602       Shweta Aman, Lajean Manes 11/18/2020,  4:04 PM

## 2020-11-18 NOTE — Progress Notes (Signed)
PT Cancellation Note  Patient Details Name: Paul Chambers MRN: [id number] DOB: 1965/11/14   Cancelled Treatment:    Reason Eval/Treat Not Completed: Patient at procedure or test/unavailable;Patient not medically ready,.  Pt getting a PICC placed on arrival.  Will see as able, but expect will defer until 10/7 11/18/2020  Jacinto Halim., PT Acute Rehabilitation Services [phone]  (pager) [phone]  (office)   Eliseo Gum Boykin Baetz 11/18/2020, 3:57 PM

## 2020-11-18 NOTE — Progress Notes (Signed)
Progress Note  28 Days Post-Op  Subjective: Two drains upsized yesterday by IR. Minimal output from LLQ and RLQ drains, RUQ drain put out 70 ml. WBC downtrending today to 11 from 17.  Objective: Vital signs in last 24 hours: Temp:  [98.1 F (36.7 C)-100.6 F (38.1 C)] 100 F (37.8 C) (10/06 0300) Pulse Rate:  [53-114] 61 (10/06 0500) Resp:  [15-30] 30 (10/06 0500) BP: (92-147)/(48-102) 114/76 (10/06 0500) SpO2:  [78 %-100 %] 100 % (10/06 0500) FiO2 (%):  [40 %-100 %] 40 % (10/06 0257) Weight:  [99.7 kg] 99.7 kg (10/06 0500) Last BM Date: 11/17/20  Intake/Output from previous day: 10/05 0701 - 10/06 0700 In: 1682.1 [I.V.:675.4; NG/GT:130; IV Piggyback:826.8] Out: 1300 [Urine:670; Drains:100; Stool:500] Intake/Output this shift: No intake/output data recorded.  PE: Gen:  calm on the vent, alert and following commands Card:  RRR Pulm:  CTAB, mechanically ventilated Abd: IR drain x2 on the right, upper drain with milky purulent fluid, lower drain with scant purulent fluid;  perc drain on left with scant purulent fluid; drainage pouch over previous surgical drain sites with milky fluid; midline wound clean with granulation tissue present; rectal tube with liquid stool   Lab Results:  Recent Labs    11/17/20 0445 11/18/20 0549  WBC 17.8* 11.7*  HGB 8.3* 7.5*  HCT 27.1* 24.5*  PLT 231 200   BMET Recent Labs    11/17/20 0445 11/18/20 0549  NA 138 141  K 3.6 3.8  CL 103 106  CO2 27 27  GLUCOSE 136* 132*  BUN 16 15  CREATININE 0.67 0.66  CALCIUM 8.0* 7.9*   PT/INR No results for input(s): LABPROT, INR in the last 72 hours. CMP     Component Value Date/Time   NA 141 11/18/2020 0549   K 3.8 11/18/2020 0549   CL 106 11/18/2020 0549   CO2 27 11/18/2020 0549   GLUCOSE 132 (H) 11/18/2020 0549   BUN 15 11/18/2020 0549   CREATININE 0.66 11/18/2020 0549   CALCIUM 7.9 (L) 11/18/2020 0549   PROT 5.6 (L) 11/18/2020 0549   ALBUMIN <1.5 (L) 11/18/2020 0549   AST  20 11/18/2020 0549   ALT 19 11/18/2020 0549   ALKPHOS 82 11/18/2020 0549   BILITOT 0.5 11/18/2020 0549   GFRNONAA >60 11/18/2020 0549   Lipase     Component Value Date/Time   LIPASE 29 11/13/2020 0054       Studies/Results: CT ABDOMEN PELVIS W CONTRAST  Result Date: 11/16/2020 CLINICAL DATA:  A 55 year old male with signs of sepsis and bowel perforation suspected. EXAM: CT ABDOMEN AND PELVIS WITH CONTRAST TECHNIQUE: Multidetector CT imaging of the abdomen and pelvis was performed using the standard protocol following bolus administration of intravenous contrast. CONTRAST:  80mL OMNIPAQUE IOHEXOL 350 MG/ML SOLN COMPARISON:  Multiple prior studies most recent comparison from November 13, 2020. FINDINGS: Lower chest: Catheter for venous access terminates at the caval to atrial junction. The central pulmonary vasculature with engorgement of main pulmonary artery to 3.8 cm, partially visualized. Dense basilar consolidation and LEFT-sided effusion is similar to the recent CT of the chest, abdomen and pelvis as is a RIGHT effusion with RIGHT basilar airspace disease. Hepatobiliary: Perihepatic fluid diminished since previous imaging. Drain remains in place entering via RIGHT flank approach between ribs 8 and 9. No focal, suspicious hepatic lesion. Gallbladder is contracted. No gross biliary duct distension. Pancreas: Signs of pancreatic necrosis with large necrotic collection in the anterior pararenal space tracking into the transverse mesocolon  where there is evidence of frank colonic communication best seen on sagittal image 108/7. A small amount of ingested contrast media is seen in the dependent aspect of the necrotic collection in this location. This communicates with the perihepatic fluid as well and with necrotic debris in the LEFT retroperitoneum. A drain entering via LEFT flank approach terminates in the anterior pararenal space in the LEFT retroperitoneum. Collection in the LEFT retroperitoneum  measuring 11.1 x 5.0 cm previously 12.0 x 4.7 cm. Collection in the transverse mesocolon 5.6 x 4.3 cm (image 50/3) previously 6.2 x 4.4 cm. Drain in the RIGHT anterior pararenal space is similar. Lower extension of LEFT flank fluid into the LEFT iliac fossa measures 4.9 x 4.2 cm as compared to 5.3 x 4.9 cm (image 69/3) Signs of diffuse peritoneal inflammation and omental stranding. Perienteric fluid tracking in the anterior abdomen. Tract extending through the abdominal wall on the RIGHT in the mid abdomen communicates with necrotic debris in the transverse mesocolon. Spleen: Unremarkable currently. Adrenals/Urinary Tract: Adrenal glands normal. Symmetric renal enhancement. No hydronephrosis. Collapsed urinary bladder. Stomach/Bowel: Feeding tube in the proximal jejunum beyond the ligament of Treitz. Signs of mesenteric inflammation. Bowel wall thickening throughout the small bowel in the abdomen. Colonic communication with walled-off necrosis in the retroperitoneum and pancreatic bed. Vascular/Lymphatic: Aortic atherosclerosis of the abdominal aorta without aneurysm. Smooth contour of the IVC. Narrowing of the splenic vein as it passes through the necrotic collection in the retroperitoneum. There is no gastrohepatic or hepatoduodenal ligament lymphadenopathy. No retroperitoneal or mesenteric lymphadenopathy. No pelvic sidewall lymphadenopathy. Reproductive: Unremarkable. Other: Diffuse inflammation throughout the abdomen. Ascites and interloop fluid with similar volume. Fluid about the liver is diminished in volume compared to the previous study, drain remaining in place, other surgical drains and collections as outlined above. There is also stranding and fat necrosis as well as inflammation tracking into the RIGHT iliac fossa less than on the LEFT. Musculoskeletal: No acute bone finding. No destructive bone process. Spinal degenerative changes. IMPRESSION: Paul Chambers colonic communication along the transverse colon  into the mesocolic portion of the necrotic collection with extensive debris and fluid throughout the retroperitoneum in the setting of necrotic pancreatitis. Sequela of necrotic pancreatitis extends into the RIGHT upper quadrant about the liver, below the LEFT hemidiaphragm and throughout the LEFT and RIGHT retroperitoneum into the pelvis on the LEFT greater than RIGHT. Numerous drains in place still with considerable debris in fluid in the abdomen though with diminished amount of fluid and gas along the liver when compared to previous imaging. Persistent basilar airspace disease and effusions LEFT worse than RIGHT. Consider the possibility of developing infection at the LEFT lung base. Tract extending through the abdominal wall on the RIGHT in the mid abdomen communicates with necrotic debris in the transverse mesocolon. Signs of diffuse peritoneal inflammation and omental stranding. Bowel wall thickening throughout small bowel in the abdomen likely related to diffuse enteritis in the setting diffuse peritoneal inflammation. Findings of bowel communication with necrotic collection were called to the provider caring for the patient at the time of dictation. These results were called by telephone at the time of interpretation on 11/16/2020 at 2:23 pm to provider [facility] , who verbally acknowledged these results. Electronically Signed   By: Donzetta Kohut M.D.   On: 11/16/2020 14:24   IR Catheter Tube Change  Result Date: 11/17/2020 INDICATION: 55 year old gentleman with necrotizing pancreatitis, sepsis and bowel perforation status post placement of drainage catheters in the right upper quadrant, right lower quadrant,  and left lower quadrants. Most recent CT from 11/16/2020 demonstrates persistent collections near the right and left lower quadrant drains with increasing size of left retroperitoneal abscess. Interval improvement of right upper quadrant fluid collection. EXAM: 1. Exchange and upsize of right  lower quadrant drain from 14 [place] to 31 [place]. 2. Exchange and up size of left lower quadrant drain from 14 [place] to 20 [place]. MEDICATIONS: The patient is currently admitted to the hospital and receiving intravenous antibiotics. The antibiotics were administered within an appropriate time frame prior to the initiation of the procedure. ANESTHESIA/SEDATION: Fentanyl 3 mcg IV; Versed 100 mg IV Moderate Sedation Time:  16 minutes The patient was continuously monitored during the procedure by the interventional radiology nurse under my direct supervision. COMPLICATIONS: None immediate. PROCEDURE: Informed written consent was obtained from the patient's wife after a thorough discussion of the procedural risks, benefits and alternatives. All questions were addressed. Maximal Sterile Barrier Technique was utilized including caps, mask, sterile gowns, sterile gloves, sterile drape, hand hygiene and skin antiseptic. A timeout was performed prior to the initiation of the procedure. Patient positioned supine on the procedure table. The external segment of the right and left lower quadrant drains and surrounding skin prepped and draped in usual fashion. Contrast administered through the right lower quadrant drain under fluoroscopy showed appropriate positioning within the collection. This 33 French drain was cut and removed over a guidewire and replaced with a 16 [place] Thal drain. Contrast administered through the new drain confirmed appropriate positioning within the collection. Contrast administered through the 14 French left lower quadrant drain showed appropriate positioning. This drain was cut and removed over a guidewire and replaced with a 20 [place] Thal drain. Approximately 70 mL of purulent appearing material was drained. Both drains were connected to bulb suction, secured to skin with suture and covered with sterile dressing. IMPRESSION: 1. Right lower quadrant 14 [place] multipurpose pigtail drain exchange for  16 [place] Thal drain. 2. Left lower quadrant 14 [place] multipurpose pigtail drain exchanged for 20 [place] Thal drain. 3. These are the largest available drains in the IR department. 4. 70 mL purulent output immediately following upsized to 20 French left lower quadrant drain. 5. No significant increased output from the right lower quadrant drain following exchange. The persistent collections within the abdomen are most likely thick necrotic fat which is unlikely to drain from these catheters. Electronically Signed   By: Acquanetta Belling M.D.   On: 11/17/2020 14:38   IR Catheter Tube Change  Result Date: 11/17/2020 INDICATION: 54 year old gentleman with necrotizing pancreatitis, sepsis and bowel perforation status post placement of drainage catheters in the right upper quadrant, right lower quadrant, and left lower quadrants. Most recent CT from 11/16/2020 demonstrates persistent collections near the right and left lower quadrant drains with increasing size of left retroperitoneal abscess. Interval improvement of right upper quadrant fluid collection. EXAM: 1. Exchange and upsize of right lower quadrant drain from 14 [place] to 77 [place]. 2. Exchange and up size of left lower quadrant drain from 14 [place] to 20 [place]. MEDICATIONS: The patient is currently admitted to the hospital and receiving intravenous antibiotics. The antibiotics were administered within an appropriate time frame prior to the initiation of the procedure. ANESTHESIA/SEDATION: Fentanyl 3 mcg IV; Versed 100 mg IV Moderate Sedation Time:  16 minutes The patient was continuously monitored during the procedure by the interventional radiology nurse under my direct supervision. COMPLICATIONS: None immediate. PROCEDURE: Informed written consent was obtained from the patient's wife after a  thorough discussion of the procedural risks, benefits and alternatives. All questions were addressed. Maximal Sterile Barrier Technique was utilized including caps,  mask, sterile gowns, sterile gloves, sterile drape, hand hygiene and skin antiseptic. A timeout was performed prior to the initiation of the procedure. Patient positioned supine on the procedure table. The external segment of the right and left lower quadrant drains and surrounding skin prepped and draped in usual fashion. Contrast administered through the right lower quadrant drain under fluoroscopy showed appropriate positioning within the collection. This 69 French drain was cut and removed over a guidewire and replaced with a 16 [place] Thal drain. Contrast administered through the new drain confirmed appropriate positioning within the collection. Contrast administered through the 14 French left lower quadrant drain showed appropriate positioning. This drain was cut and removed over a guidewire and replaced with a 20 [place] Thal drain. Approximately 70 mL of purulent appearing material was drained. Both drains were connected to bulb suction, secured to skin with suture and covered with sterile dressing. IMPRESSION: 1. Right lower quadrant 14 [place] multipurpose pigtail drain exchange for 16 [place] Thal drain. 2. Left lower quadrant 14 [place] multipurpose pigtail drain exchanged for 20 [place] Thal drain. 3. These are the largest available drains in the IR department. 4. 70 mL purulent output immediately following upsized to 20 French left lower quadrant drain. 5. No significant increased output from the right lower quadrant drain following exchange. The persistent collections within the abdomen are most likely thick necrotic fat which is unlikely to drain from these catheters. Electronically Signed   By: Acquanetta Belling M.D.   On: 11/17/2020 14:38    Anti-infectives: Anti-infectives (From admission, onward)    Start     Dose/Rate Route Frequency Ordered Stop   11/17/20 1300  anidulafungin (ERAXIS) 100 mg in sodium chloride 0.9 % 100 mL IVPB        100 mg 78 mL/hr over 100 Minutes Intravenous Every 24 hours  11/16/20 1141     11/16/20 1300  anidulafungin (ERAXIS) 200 mg in sodium chloride 0.9 % 200 mL IVPB        200 mg 78 mL/hr over 200 Minutes Intravenous  Once 11/16/20 1141 11/16/20 1700   11/15/20 1015  linezolid (ZYVOX) IVPB 600 mg        600 mg 300 mL/hr over 60 Minutes Intravenous Every 12 hours 11/15/20 0919     11/14/20 1200  vancomycin (VANCOREADY) IVPB 750 mg/150 mL  Status:  Discontinued        750 mg 150 mL/hr over 60 Minutes Intravenous Every 8 hours 11/14/20 0251 11/15/20 0919   11/14/20 0330  vancomycin (VANCOREADY) IVPB 2000 mg/400 mL        2,000 mg 200 mL/hr over 120 Minutes Intravenous  Once 11/14/20 0251 11/14/20 0519   11/12/20 1900  piperacillin-tazobactam (ZOSYN) IVPB 3.375 g        3.375 g 12.5 mL/hr over 240 Minutes Intravenous Every 8 hours 11/12/20 1538     11/09/20 1730  Ampicillin-Sulbactam (UNASYN) 3 g in sodium chloride 0.9 % 100 mL IVPB  Status:  Discontinued        3 g 200 mL/hr over 30 Minutes Intravenous Every 6 hours 11/09/20 1617 11/12/20 1538   11/06/20 1545  piperacillin-tazobactam (ZOSYN) IVPB 3.375 g  Status:  Discontinued        3.375 g 12.5 mL/hr over 240 Minutes Intravenous Every 8 hours 11/06/20 1453 11/09/20 1616   11/01/20 0900  levofloxacin (LEVAQUIN) IVPB 750 mg  Status:  Discontinued        750 mg 100 mL/hr over 90 Minutes Intravenous Every 24 hours 11/01/20 0824 11/06/20 1408   11/01/20 0900  metroNIDAZOLE (FLAGYL) IVPB 500 mg  Status:  Discontinued        500 mg 100 mL/hr over 60 Minutes Intravenous Every 12 hours 11/01/20 0824 11/06/20 1408   10/29/20 2200  meropenem (MERREM) 1 g in sodium chloride 0.9 % 100 mL IVPB  Status:  Discontinued        1 g 200 mL/hr over 30 Minutes Intravenous Every 8 hours 10/29/20 1428 11/01/20 0824   10/28/20 1615  meropenem (MERREM) 2 g in sodium chloride 0.9 % 100 mL IVPB  Status:  Discontinued        2 g 200 mL/hr over 30 Minutes Intravenous Every 8 hours 10/28/20 1515 10/29/20 1428   10/28/20 1445   metroNIDAZOLE (FLAGYL) IVPB 500 mg  Status:  Discontinued        500 mg 100 mL/hr over 60 Minutes Intravenous Every 12 hours 10/28/20 1348 10/28/20 1505   10/25/20 2300  fluconazole (DIFLUCAN) IVPB 400 mg        400 mg 100 mL/hr over 120 Minutes Intravenous Every 24 hours 10/25/20 0742 10/29/20 0718   10/24/20 2300  fluconazole (DIFLUCAN) IVPB 200 mg  Status:  Discontinued        200 mg 100 mL/hr over 60 Minutes Intravenous Every 24 hours 10/24/20 0943 10/25/20 0742   10/22/20 1800  vancomycin (VANCOREADY) IVPB 1500 mg/300 mL  Status:  Discontinued        1,500 mg 150 mL/hr over 120 Minutes Intravenous Every 24 hours 10/30/2020 2225 10/22/20 0759   10/22/20 0900  linezolid (ZYVOX) IVPB 600 mg  Status:  Discontinued        600 mg 300 mL/hr over 60 Minutes Intravenous Every 12 hours 10/22/20 0800 10/27/20 0852   10/22/20 0300  piperacillin-tazobactam (ZOSYN) IVPB 3.375 g  Status:  Discontinued        3.375 g 12.5 mL/hr over 240 Minutes Intravenous Every 8 hours 11/07/2020 2225 10/28/20 1514   10/22/2020 2335  metroNIDAZOLE (FLAGYL) IVPB 500 mg  Status:  Discontinued        500 mg 100 mL/hr over 60 Minutes Intravenous Every 12 hours 10/17/2020 2142 10/22/20 0747   10/22/2020 2248  fluconazole (DIFLUCAN) IVPB 400 mg  Status:  Discontinued        400 mg 100 mL/hr over 120 Minutes Intravenous Every 24 hours 10/24/2020 2142 10/24/20 0943   11/05/2020 1748  vancomycin (VANCOREADY) IVPB 2000 mg/400 mL        2,000 mg 200 mL/hr over 120 Minutes Intravenous  Once 10/29/2020 1706 11/10/2020 2113   10/26/2020 1715  piperacillin-tazobactam (ZOSYN) IVPB 3.375 g        3.375 g 100 mL/hr over 30 Minutes Intravenous  Once 10/17/2020 1706 10/17/2020 1903        Assessment/Plan POD 27 s/p ex lap with pancreatic debridement for infected necrotic pancreatitis by Dr. Sheliah Hatch on 9/9 - Superior drain under the mesocolon along with the head of the pancreas - Inferior drain under the mesocolon along the left upper abdomen and  what is likely a position inferior to the pancreas - CT 9/24 w/ gas and fluid collections that are grossly stable in the retroperitoneum and are drained by JP tubes. Cont drains - CT 9/24 also with mildly increased amount of fluid is noted around the liver with an increased amount of air  seen in the non dependent portion concerning for persistent bowel leak. - pt is having bowel function and IR drains placed 9/26 do not appear feculent. Low clinical suspicion for bowel leak - s/p IR drains 9/26. Cx with enterococcus faecalis, sensitivities pending  - BID WTD - Patient pulled out surgical drain x2 on 10/1, he still has IR drain x3 in place. These appear to be in good position and are all putting out purulent drainage - CT 10/1 air-fluid collections throughout the abdomen and pelvis have not significantly changed in size or distribution; New wall thickening of the splenic flexure of the colon worrisome for reactive colitis - Previous drain sites leaking -  agree with drainage pouch.  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 Recent CT scan shows ongoing gas and fluid in the retroperitoneum, likely representing solid pancreatic necrosis, with communication with the transverse colon. This colonic fistula was likely the source of infection of the pancreatic necrosis. Drains have been upsized to try to obtain better source control but output remains minimal. Solid necrosis may not drain very well via the percutaneous drains, and the colonic fistula will continue to seed the necrosis. Patient will ultimately require a colon resection and further operative debridement of the pancreatic necrosis, however at  4 weeks out from his initial laparotomy this will be very difficult. Hopefully wide drainage with recent upsize in his drains will allow adequate source control until a definitive surgery can safely be performed. Continue antibiotics, with TPN to optimize nutrition. Surgery will continue to follow closely.    LOS: 28 days   Sophronia Simas, MD [facility], Hepatobiliary and Pancreatic Surgery 11/18/20 7:43 AM

## 2020-11-19 DIAGNOSIS — K8591 Acute pancreatitis with uninfected necrosis, unspecified: Secondary | ICD-10-CM | POA: Diagnosis not present

## 2020-11-19 LAB — CULTURE, RESPIRATORY W GRAM STAIN

## 2020-11-19 LAB — CBC
HCT: 24.1 % — ABNORMAL LOW (ref 39.0–52.0)
Hemoglobin: 7.4 g/dL — ABNORMAL LOW (ref 13.0–17.0)
MCH: 30.8 pg (ref 26.0–34.0)
MCHC: 30.7 g/dL (ref 30.0–36.0)
MCV: 100.4 fL — ABNORMAL HIGH (ref 80.0–100.0)
Platelets: 176 10*3/uL (ref 150–400)
RBC: 2.4 MIL/uL — ABNORMAL LOW (ref 4.22–5.81)
RDW: 18.6 % — ABNORMAL HIGH (ref 11.5–15.5)
WBC: 7.9 10*3/uL (ref 4.0–10.5)
nRBC: 0 % (ref 0.0–0.2)

## 2020-11-19 LAB — PHOSPHORUS: Phosphorus: 3.3 mg/dL (ref 2.5–4.6)

## 2020-11-19 LAB — GLUCOSE, CAPILLARY
Glucose-Capillary: 161 mg/dL — ABNORMAL HIGH (ref 70–99)
Glucose-Capillary: 175 mg/dL — ABNORMAL HIGH (ref 70–99)
Glucose-Capillary: 189 mg/dL — ABNORMAL HIGH (ref 70–99)
Glucose-Capillary: 193 mg/dL — ABNORMAL HIGH (ref 70–99)
Glucose-Capillary: 208 mg/dL — ABNORMAL HIGH (ref 70–99)
Glucose-Capillary: 240 mg/dL — ABNORMAL HIGH (ref 70–99)

## 2020-11-19 LAB — URINALYSIS, ROUTINE W REFLEX MICROSCOPIC
Bacteria, UA: NONE SEEN
Bilirubin Urine: NEGATIVE
Glucose, UA: NEGATIVE mg/dL
Hgb urine dipstick: NEGATIVE
Ketones, ur: NEGATIVE mg/dL
Leukocytes,Ua: NEGATIVE
Nitrite: NEGATIVE
Protein, ur: 30 mg/dL — AB
Specific Gravity, Urine: 1.035 — ABNORMAL HIGH (ref 1.005–1.030)
pH: 5 (ref 5.0–8.0)

## 2020-11-19 LAB — SUSCEPTIBILITY RESULT

## 2020-11-19 LAB — SUSCEPTIBILITY, AER + ANAEROB

## 2020-11-19 LAB — BASIC METABOLIC PANEL
Anion gap: 8 (ref 5–15)
BUN: 14 mg/dL (ref 6–20)
CO2: 27 mmol/L (ref 22–32)
Calcium: 8.1 mg/dL — ABNORMAL LOW (ref 8.9–10.3)
Chloride: 102 mmol/L (ref 98–111)
Creatinine, Ser: 0.57 mg/dL — ABNORMAL LOW (ref 0.61–1.24)
GFR, Estimated: >60 mL/min (ref >60)
Glucose, Bld: 207 mg/dL — ABNORMAL HIGH (ref 70–99)
Potassium: 3.7 mmol/L (ref 3.5–5.1)
Sodium: 137 mmol/L (ref 135–145)

## 2020-11-19 LAB — AEROBIC/ANAEROBIC CULTURE W GRAM STAIN (SURGICAL/DEEP WOUND)

## 2020-11-19 LAB — AMMONIA: Ammonia: 15 umol/L (ref 9–35)

## 2020-11-19 LAB — MAGNESIUM: Magnesium: 2 mg/dL (ref 1.7–2.4)

## 2020-11-19 MED ORDER — TRACE MINERALS CU-MN-SE-ZN 300-55-60-3000 MCG/ML IV SOLN
INTRAVENOUS | Status: AC
  Filled 2020-11-19: qty 899.87

## 2020-11-19 MED ORDER — GUAIFENESIN 100 MG/5ML PO SOLN
5.0000 mL | ORAL | Status: DC | PRN
  Administered 2020-11-19: 100 mg
  Filled 2020-11-19: qty 5

## 2020-11-19 MED ORDER — FUROSEMIDE 10 MG/ML IJ SOLN
40.0000 mg | Freq: Once | INTRAMUSCULAR | Status: AC
  Administered 2020-11-19: 40 mg via INTRAVENOUS
  Filled 2020-11-19: qty 4

## 2020-11-19 MED ORDER — LACTULOSE 10 GM/15ML PO SOLN
20.0000 g | Freq: Three times a day (TID) | ORAL | Status: DC
  Administered 2020-11-19 – 2020-11-21 (×6): 20 g via ORAL
  Filled 2020-11-19 (×6): qty 30

## 2020-11-19 MED ORDER — GUAIFENESIN 100 MG/5ML PO SOLN
5.0000 mL | Freq: Three times a day (TID) | ORAL | Status: DC
  Administered 2020-11-19 – 2020-11-27 (×22): 100 mg
  Filled 2020-11-19 (×23): qty 5

## 2020-11-19 MED ORDER — GUAIFENESIN 100 MG/5ML PO SOLN: 5.0000 mL | ORAL | Status: DC | PRN

## 2020-11-19 NOTE — Progress Notes (Signed)
This RN called surgery on-call. Surgery states it is okay for patient to receive medications via his NG tube but NO TUBE FEEDS. Will communicate this with CCM team.

## 2020-11-19 NOTE — Progress Notes (Signed)
Nutrition Follow-up  DOCUMENTATION CODES:  Severe malnutrition in context of acute illness/injury  INTERVENTION:   TPN dosing per Pharmacy to meet 100% of estimated needs.  NUTRITION DIAGNOSIS:  Severe Malnutrition related to acute illness (infected necrotic pancreatitis) as evidenced by moderate fat depletion, moderate muscle depletion, energy intake < or equal to 50% for > or equal to 5 days, percent weight loss (8.4% weight loss in 1.5 months). -- ongoing  GOAL:  Patient will meet greater than or equal to 90% of their needs -- met with TPN   MONITOR:  Diet advancement, Labs, Weight trends, TF tolerance, Skin, I & O's, Other (Comment) (TPN)  REASON FOR ASSESSMENT:  Consult Enteral/tube feeding initiation and management  ASSESSMENT:  55 year old male who presented to the ED on 9/08 with AMS and abdominal pain. Pt seen at [facility] 1 week PTA and was diagnosed with AKI and pancreatitis. PMH of HTN, gout, tobacco abuse, EtOH abuse. Pt admitted with sepsis secondary to infected necrotic pancreatitis.  S/P ex-lap with pancreatic debridement for infected necrotic pancreatitis 9/9. Patient developed respiratory distress (possible aspiration event) and required intubation on 10/2, then PEA arrested requiring 2 minutes of CPR. CT abdomen 10/4 showed perforation of the transverse colon due to necrotizing pancreatitis. TF was discontinued.  All three IR drains were upsized on 10/5. TPN initiated 10/6 via triple lumen PICC.  Discussed patient in ICU rounds and with RN today. TPN has been initiated. Currently infusing at 40 ml/h with goal rate of 80 ml/h (with today's new TPN bag) to provide 2394 kcal and 134 gm protein. This will meet 100% of patients estimated nutrition needs.   Patient is currently intubated on ventilator support MV: 9.4 L/min Temp (24hrs), Avg:98.9 F (37.2 C), Min:98.5 F (36.9 C), Max:99.3 F (37.4 C)   Medications reviewed and include Novolog, lactulose,  Protonix, Precedex, IV abx.  Labs reviewed. CBGs: [phone]  Admission weight: 93.7 kg Current weight: 100.7 kg (9/29)  UOP: 570 ml x 24 hours Drain output: 260 ml x 24 hours Stool output: 100 ml x 24 hours via rectal tube I/O: +10.6 L since admit  Diet Order:   Diet Order             Diet NPO time specified Except for: Sips with Meds  Diet effective midnight                  EDUCATION NEEDS:  No education needs have been identified at this time  Skin:  Skin Assessment: Skin Integrity Issues: Skin Integrity Issues:: DTI DTI: coccyx Stage I: - Incisions: abdomen  Last BM:  10/7 rectal tube  Height:  Ht Readings from Last 1 Encounters:  11/14/20 _0  (1.753 m)   Weight:  Wt Readings from Last 1 Encounters:  11/19/20 100.7 kg   BMI:  Body mass index is 32.78 kg/m.  Estimated Nutritional Needs:  Kcal:  2130-8657 Protein:  125-145 grams Fluid:  >/= 2.0 L   Lucas Mallow, RD, LDN, CNSC Please refer to Amion for contact information.

## 2020-11-19 NOTE — Progress Notes (Signed)
[name]    Date of Admission:  11/11/2020      ID: Paul Chambers is a 55 y.o. male with   Principal Problem:   Acute necrotizing pancreatitis Active Problems:   Toxic encephalopathy   Severe sepsis (HCC)   Hyponatremia   Hypoalbuminemia   Macrocytic anemia   Pressure injury of skin   Protein-calorie malnutrition, severe   Intra-abdominal abscess ([place])   Acute respiratory failure with hypoxia (HCC)   Septic shock (HCC)   Cardiac arrest (HCC)    Subjective: Afebrile, more alert. Not needing pressors, secretions while intubated. Has ongoing drainage in collection system  Medications:   arformoterol  15 mcg Nebulization BID   chlorhexidine gluconate (MEDLINE KIT)  15 mL Mouth Rinse BID   Chlorhexidine Gluconate Cloth  6 each Topical Q0600   heparin  5,000 Units Subcutaneous Q8H   insulin aspart  0-20 Units Subcutaneous Q4H   lactulose  20 g Oral Q8H   mouth rinse  15 mL Mouth Rinse 10 times per day   pantoprazole (PROTONIX) IV  40 mg Intravenous Q24H   revefenacin  175 mcg Nebulization Daily   sodium chloride flush  10-40 mL Intracatheter Q12H   sodium chloride flush  10-40 mL Intracatheter Q12H   sodium chloride flush  50 mL Intracatheter Q8H   sodium chloride flush  50 mL Intracatheter Q8H   sodium chloride flush  50 mL Intracatheter Q8H    Objective: Vital signs in last 24 hours: Temp:  [98.5 F (36.9 C)-99.3 F (37.4 C)] 98.6 F (37 C) (10/07 1504) Pulse Rate:  [61-82] 73 (10/07 1503) Resp:  [18-29] 26 (10/07 1503) BP: (83-112)/(50-91) 112/65 (10/07 1503) SpO2:  [90 %-100 %] 99 % (10/07 1503) FiO2 (%):  [40 %] 40 % (10/07 1503) Weight:  [100.7 kg] 100.7 kg (10/07 0500) Physical Exam  Constitutional: He is oriented to person He appears well-developed and well-nourished. No distress.  HENT: OETT in place Mouth/Throat: Oropharynx is clear and moist. No oropharyngeal exudate.  Cardiovascular: Normal rate, regular rhythm and normal  heart sounds. Exam reveals no gallop and no friction rub.  No murmur heard.  Pulmonary/Chest: Effort normal and breath sounds normal. No respiratory distress. He has no wheezes.  Abdominal: Soft. Bowel sounds are normal. He exhibits no distension. 3 drains in place Lymphadenopathy:  He has no cervical adenopathy.  Neurological: He is alert and oriented to person, place, and time.  Skin: Skin is warm and dry. No rash noted. No erythema.  Psychiatric: He has a normal mood and affect. His behavior is normal.   Lab Results Recent Labs    11/18/20 0549 11/19/20 0354  WBC 11.7* 7.9  HGB 7.5* 7.4*  HCT 24.5* 24.1*  NA 141 137  K 3.8 3.7  CL 106 102  CO2 27 27  BUN 15 14  CREATININE 0.66 0.57*   Liver Panel Recent Labs    11/18/20 0549  PROT 5.6*  ALBUMIN <1.5*  AST 20  ALT 19  ALKPHOS 82  BILITOT 0.5     Microbiology: Reviewed - e.faecalis (amp S) Studies/Results: [place] EKG SITE RITE  Result Date: 11/18/2020 If Site Rite image not attached, placement could not be confirmed due to current cardiac rhythm.    Assessment/Plan: Necrotizing pancreatitis = initially placed on linezolid while sensitivities on enterococcus was pending. Since it is amp S, we will narrow to pip/tazo plus continue on anidulafungin for now. No longer needs linezolid.  Leukocytosis = continues  to normalize  Respiratory distress on ventilator = continues to be weaned. Also adding treatment to thin secretions. Defer to pulmonary team for management.  [facility] Pager: [phone]  11/19/2020, 3:07 PM

## 2020-11-19 NOTE — Progress Notes (Signed)
Occupational Therapy Treatment Patient Details Name: Paul Chambers MRN: [id number] DOB: [birth year] Today's Date: 11/19/2020   History of present illness Pt adm 9/8 with abdominal pain and AMS. Pt found to have extensive pneumoperitoneum and underwent ex lap on 9/9. Pt with infected necrotizing pancreatitis. Pt with acute hypoxic respiratory failure. Pt intubated 9/9-9/12. 10/2 ETT placed.  On 60% FiO2 12 PEEP  PMH - etoh, HTN, gout, pancreatitis.   OT comments  Patient seen in conjunction with PT this date.  First real session since cardiac arrest and being vented.  Patient needing up to Mod A for basic bed mobility and Min A of 2 for sit to stand and side steps toward the head of the bed.  Vital signs monitored throughout, and mobility progressed slowly.  OT to continue efforts in the acute setting, LTACH has been recommended for post acute rehab to maximize functional status prior to returning home.     Recommendations for follow up therapy are one component of a multi-disciplinary discharge planning process, led by the attending physician.  Recommendations may be updated based on patient status, additional functional criteria and insurance authorization.    Follow Up Recommendations  LTACH    Equipment Recommendations  3 in 1 bedside commode;Tub/shower seat    Recommendations for Other Services      Precautions / Restrictions Precautions Precautions: Fall Precaution Comments: Several abdominal drains (JPD R x2 and x2 gravity drains on R/1 on LLQ, perihepatic drains)  Vented. Restrictions Weight Bearing Restrictions: No       Mobility Bed Mobility Overal bed mobility: Needs Assistance Bed Mobility: Supine to Sit;Sit to Supine     Supine to sit: Min assist Sit to supine: Mod assist   General bed mobility comments: pt reache for assist with R UE and up via L elbow.  scooted to EOB without further assist. Patient Response: Cooperative  Transfers Overall transfer level: Needs  assistance   Transfers: Sit to/from Stand Sit to Stand: Min assist;+2 physical assistance         General transfer comment: from EOB to standin with IV pole or external assist for stability only    Balance Overall balance assessment: Needs assistance Sitting-balance support: Feet supported;Bilateral upper extremity supported;No upper extremity supported Sitting balance-Leahy Scale: Fair     Standing balance support: Single extremity supported;No upper extremity supported Standing balance-Leahy Scale: Poor Standing balance comment: worked on w/shifting, uright stance, balance in midline                           ADL either performed or assessed with clinical judgement   ADL                   Upper Body Dressing : Moderate assistance;Bed level   Lower Body Dressing: Total assistance;Bed level                                         Cognition Arousal/Alertness: Awake/alert Behavior During Therapy: Flat affect;Restless Overall Cognitive Status: Difficult to assess                         Following Commands: Follows one step commands with increased time                Exercises Exercises: Other exercises Other Exercises Other Exercises: hip/knee flex/ext AROM/graded  resistance bil x10 reps Other Exercises: bil bicep/tricep presses with more isolated shoulder with graded resistance x10 reps Other Exercises: A/P and grip/release finger flex/ext  x10 reps each.   Shoulder Instructions       General Comments vss on vent during standing activity    Pertinent Vitals/ Pain       Pain Assessment: Faces Faces Pain Scale: Hurts a little bit Pain Location: abdomen a flanks Pain Descriptors / Indicators: Discomfort Pain Intervention(s): Monitored during session                                                          Frequency  Min 2X/week        Progress Toward Goals  OT Goals(current goals  can now be found in the care plan section)  Progress towards OT goals: Progressing toward goals  Acute Rehab OT Goals Patient Stated Goal: pt wants to get home OT Goal Formulation: With patient Time For Goal Achievement: 11/26/20 Potential to Achieve Goals: Good  Plan Discharge plan remains appropriate;Frequency remains appropriate    Co-evaluation    PT/OT/SLP Co-Evaluation/Treatment: Yes Reason for Co-Treatment: Complexity of the patient's impairments (multi-system involvement);For patient/therapist safety;To address functional/ADL transfers   OT goals addressed during session: ADL's and self-care      AM-PAC OT "6 Clicks" Daily Activity     Outcome Measure   Help from another person eating meals?: A Little Help from another person taking care of personal grooming?: A Little Help from another person toileting, which includes using toliet, bedpan, or urinal?: A Lot Help from another person bathing (including washing, rinsing, drying)?: A Lot Help from another person to put on and taking off regular upper body clothing?: A Lot Help from another person to put on and taking off regular lower body clothing?: A Lot 6 Click Score: 14    End of Session Equipment Utilized During Treatment: Gait belt;Rolling walker  OT Visit Diagnosis: Unsteadiness on feet (R26.81);Muscle weakness (generalized) (M62.81);Other symptoms and signs involving cognitive function;Pain   Activity Tolerance Patient limited by fatigue   Patient Left in bed;with call bell/phone within reach   Nurse Communication Mobility status        Time: 1607-3710 OT Time Calculation (min): 25 min  Charges: OT General Charges $OT Visit: 1 Visit OT Treatments $Therapeutic Activity: 8-22 mins  11/19/2020  RP, OTR/L  Acute Rehabilitation Services  Office:  [phone]   Suzanna Obey 11/19/2020, 2:10 PM

## 2020-11-19 NOTE — Progress Notes (Signed)
Physical Therapy Treatment Patient Details Name: Paul Chambers MRN: [id number] DOB: [birth year] Today's Date: 11/19/2020   History of Present Illness Pt adm 9/8 with abdominal pain and AMS. Pt found to have extensive pneumoperitoneum and underwent ex lap on 9/9. Pt with infected necrotizing pancreatitis. Pt with acute hypoxic respiratory failure. Pt intubated 9/9-9/12. 10/2 ETT placed.  On 60% FiO2 12 PEEP  PMH - etoh, HTN, gout, pancreatitis.    PT Comments    Pt was agreeable to participating today.  Emphasis on A/resisted ROM U and LE's for strengthening and swelling, transition to EOB, sitting balance, sit to stands, standing balance/pre-gait activity with side-stepping to [facility] to lay down when pt became fatigued.     Recommendations for follow up therapy are one component of a multi-disciplinary discharge planning process, led by the attending physician.  Recommendations may be updated based on patient status, additional functional criteria and insurance authorization.  Follow Up Recommendations  LTACH;Supervision/Assistance - 24 hour;Other (comment) (SNF if respiratory status doesn't significantly change.)     Equipment Recommendations  Other (comment) (TBD)    Recommendations for Other Services Rehab consult     Precautions / Restrictions Precautions Precautions: Fall Precaution Comments: Several abdominal drains (JPD R x2 and x2 gravity drains on R/1 on LLQ, perihepatic drains)     Mobility  Bed Mobility Overal bed mobility: Needs Assistance Bed Mobility: Supine to Sit;Sit to Supine     Supine to sit: Min assist Sit to supine: Mod assist   General bed mobility comments: pt reache for assist with R UE and up via L elbow.  scooted to EOB without further assist.    Transfers Overall transfer level: Needs assistance   Transfers: Sit to/from Stand Sit to Stand: Min assist;+2 physical assistance (x3)         General transfer comment: from EOB to standin with IV  pole or external assist for stability only  Ambulation/Gait Ambulation/Gait assistance: Min assist;+2 physical assistance Gait Distance (Feet): 3 Feet (side-stepping up toward HOB, limited by ETT tubing) Assistive device: IV Airline pilot    Modified Rankin (Stroke Patients Only)       Balance Overall balance assessment: Needs assistance Sitting-balance support: Feet supported;Bilateral upper extremity supported;No upper extremity supported Sitting balance-Leahy Scale: Fair     Standing balance support: Single extremity supported;No upper extremity supported;During functional activity Standing balance-Leahy Scale: Poor Standing balance comment: worked on w/shifting, uright stance, balance in midline                            Cognition Arousal/Alertness: Awake/alert Behavior During Therapy: Flat affect;Restless Overall Cognitive Status: Difficult to assess                                        Exercises Other Exercises Other Exercises: hip/knee flex/ext AROM/graded resistance bil x10 reps Other Exercises: bil bicep/tricep presses with more isolated shoulder with graded resistance x10 reps Other Exercises: A/P and grip/release finger flex/ext  x10 reps each.    General Comments General comments (skin integrity, edema, etc.): vss on vent during standing activity      Pertinent Vitals/Pain Faces Pain Scale: Hurts a little bit Pain Location: abdomen a flanks Pain Descriptors / Indicators: Discomfort  Pain Intervention(s): Monitored during session    Home Living                      Prior Function            PT Goals (current goals can now be found in the care plan section) Acute Rehab PT Goals Patient Stated Goal: pt wants to get home PT Goal Formulation: With patient Time For Goal Achievement: 11/26/20 Potential to Achieve Goals: Good Progress towards PT goals: Progressing  toward goals    Frequency    Min 3X/week      PT Plan Current plan remains appropriate    Co-evaluation              AM-PAC PT "6 Clicks" Mobility   Outcome Measure  Help needed turning from your back to your side while in a flat bed without using bedrails?: A Little Help needed moving from lying on your back to sitting on the side of a flat bed without using bedrails?: A Little Help needed moving to and from a bed to a chair (including a wheelchair)?: Total Help needed standing up from a chair using your arms (e.g., wheelchair or bedside chair)?: Total Help needed to walk in hospital room?: Total Help needed climbing 3-5 steps with a railing? : Total 6 Click Score: 10    End of Session Equipment Utilized During Treatment: Oxygen Activity Tolerance: Patient tolerated treatment well Patient left: in bed;with call bell/phone within reach;with bed alarm set;with nursing/sitter in room Nurse Communication: Mobility status PT Visit Diagnosis: Other abnormalities of gait and mobility (R26.89)     Time: 3875-6433 PT Time Calculation (min) (ACUTE ONLY): 25 min  Charges:  $Therapeutic Activity: 8-22 mins                     11/19/2020  Jacinto Halim., PT Acute Rehabilitation Services [phone]  (pager) [phone]  (office)   Eliseo Gum Savier Trickett 11/19/2020, 1:44 PM

## 2020-11-19 NOTE — Progress Notes (Signed)
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  Assessment:  [name] presented on 9/8 with abdominal pain and AMS found to have necrotizing pancreatitis s/p ex-lap on 9/9 and debridement.  Patient was on TPN and then transitioned to TF.  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  He is s/p surgical drain upsized.  Pharmacy consulted to resume TPN since TF is on hold on 10/4.  Glucose / Insulin: no hx DM, A1c 6.8%  BG <180 requiring 20 units SS in last 24 hr Prior to NPO with TF held, was requiring high amount of daily insulin (TDD ~80 units) Electrolytes: lytes WNL, note Phos 4.3>3.3 Renal: Scr 0.6, stable.  BUN WNL. 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 GI Surgeries / Procedures:  9/9 Ex lap and pancreatic debridement  9/26 IR placed 5 surgical abdominal drains, 2 removed by patient later 10/5 IR surgical drains upsized  Central access: PICC 11/13/20, another PICC to be placed TPN start date: 9/10 >> 9/24, restarted 11/18/20  Nutritional Goals, RD Estimated Needs Total Energy Estimated Needs: 2350-2550 Total Protein Estimated Needs: 125-145 grams Total Fluid Estimated Needs: >/= 2.0 L  Current Nutrition:  NPO, TPN  Plan:  Increase concentrated TPN to goal rate 88mL/hr at 1800  TPN will provide 135g AA,  334g CHO  and 72g ILE for a total of 2395 kCal, meeting 100% of patient's needs Electrolytes in TPN: Na 72mEq/L, K 14mEq/L, Ca 8mEq/L, Mg 31mEq/L, Increase Phos to standard 72mmol/L. Cl:Ac 1:1 Add standard MVI and trace elements to TPN.  Add thiamine/folate for hx EtOH use. Continue rSSI Q4H and increase 20 units regular insulin in TPN (to account for rate increase) Standard TPN labs on Mon/Thurs, labs in AM  Thank you for allowing pharmacy to participate in this patient's care.  12m, PharmD PGY1 Acute Care Resident  11/19/2020,10:32 AM

## 2020-11-19 NOTE — Progress Notes (Addendum)
Referring Physician(s): Kirstie Mirza PA  Supervising Physician: Mir, Mauri Reading  Patient Status:  [facility] - In-pt  Chief Complaint:  History of necrotizing pancreatitis, bowel perforation and sepsis s/p multiple intra abdominal abscess drains. IR placed a left mid hemi abdomen (LLQ) drain, a perihepatic abscess drain RUQ),  a RLQ abscess drain  and aspirated 5 ml of bloody fluid onto the left hemipelvis.on 9.26.22 by Dr. Odis Luster.  On 10.5.22 the RLQ drain was upsize to a 16 Fr and the LLQ abscess drain was upsized to a 20 Fr. Performed by Dr. Carmon Ginsberg. Mir  Subjective:  Unable to assess due to patient condition.  Allergies: Patient has no known allergies.  Medications: Prior to Admission medications   Medication Sig Start Date End Date Taking? Authorizing Provider  acetaminophen (TYLENOL) 500 MG tablet Take 500 mg by mouth every 6 (six) hours as needed for mild pain.   Yes [provider]  albuterol (VENTOLIN HFA) 108 (90 Base) MCG/ACT inhaler Inhale 1-2 puffs into the lungs every 6 (six) hours as needed for wheezing or shortness of breath.   Yes [provider]  allopurinol (ZYLOPRIM) 300 MG tablet Take 300 mg by mouth daily.   Yes [provider]  ALPRAZolam Prudy Feeler) 0.5 MG tablet Take 0.5 mg by mouth 3 (three) times daily as needed for anxiety.   Yes [provider]  amLODipine (NORVASC) 5 MG tablet Take 5 mg by mouth daily.   Yes [provider]  Fluticasone-Umeclidin-Vilant (TRELEGY ELLIPTA) 100-62.5-25 MCG/INH AEPB Inhale 1 puff into the lungs daily.   Yes [provider]  folic acid (FOLVITE) 1 MG tablet Take 1 mg by mouth daily.   Yes [provider]  furosemide (LASIX) 40 MG tablet Take 40 mg by mouth 2 (two) times daily.   Yes [provider]  Multiple Vitamins-Minerals (PRESERVISION AREDS 2+MULTI VIT PO) Take 2 tablets by mouth daily.   Yes [provider]  Omega-3 Krill Oil 500 MG CAPS Take 1,000 mg by  mouth daily.   Yes [provider]  omeprazole (PRILOSEC) 40 MG capsule Take 40 mg by mouth daily.   Yes [provider]  oxyCODONE (OXY IR/ROXICODONE) 5 MG immediate release tablet Take 2.5-5 mg by mouth every 6 (six) hours as needed for severe pain.   Yes [provider]  thiamine (VITAMIN B-1) 100 MG tablet Take 100 mg by mouth daily.   Yes [provider]     Vital Signs: BP 105/65   Pulse 64   Temp 98.7 F (37.1 C) (Oral)   Resp (!) 24   Ht  (1.753 m)   Wt 222 lb 0.1 oz (100.7 kg)   SpO2 98%   BMI 32.78 kg/m   Physical Exam Vitals and nursing note reviewed.  Constitutional:      Appearance: He is well-developed. He is ill-appearing.  HENT:     Head: Normocephalic.  Pulmonary:     Comments: Intubated  Abdominal:     Comments: Drain Location: LLQ Size: Fr size: 20 Fr Thal quick Date of placement: 9.26.22 upsized on 10.5.22  Currently to: Drain collection device: suction bulb  Drain Location: RLQ Size: Fr size: 16 Fr Thal Quick  Date of placement:   9.26.22 upsized on 10.5.22 Currently to: Drain collection device: suction bulb  Drain Location: RUQ Size: Fr size: 14 Fr Date of placement: 9.26.22  Currently to: Drain collection device: gravity - 25 ml of purulent output noted.  Musculoskeletal:  Cervical back: Normal range of motion.     Comments: In mittens  Skin:    General: Skin is dry.  Neurological:     Mental Status: He is alert.    Imaging: CT ABDOMEN PELVIS W CONTRAST  Result Date: 11/16/2020 CLINICAL DATA:  A 55 year old male with signs of sepsis and bowel perforation suspected. EXAM: CT ABDOMEN AND PELVIS WITH CONTRAST TECHNIQUE: Multidetector CT imaging of the abdomen and pelvis was performed using the standard protocol following bolus administration of intravenous contrast. CONTRAST:  34mL OMNIPAQUE IOHEXOL 350 MG/ML SOLN COMPARISON:  Multiple prior studies most recent comparison from November 13, 2020.  FINDINGS: Lower chest: Catheter for venous access terminates at the caval to atrial junction. The central pulmonary vasculature with engorgement of main pulmonary artery to 3.8 cm, partially visualized. Dense basilar consolidation and LEFT-sided effusion is similar to the recent CT of the chest, abdomen and pelvis as is a RIGHT effusion with RIGHT basilar airspace disease. Hepatobiliary: Perihepatic fluid diminished since previous imaging. Drain remains in place entering via RIGHT flank approach between ribs 8 and 9. No focal, suspicious hepatic lesion. Gallbladder is contracted. No gross biliary duct distension. Pancreas: Signs of pancreatic necrosis with large necrotic collection in the anterior pararenal space tracking into the transverse mesocolon where there is evidence of frank colonic communication best seen on sagittal image 108/7. A small amount of ingested contrast media is seen in the dependent aspect of the necrotic collection in this location. This communicates with the perihepatic fluid as well and with necrotic debris in the LEFT retroperitoneum. A drain entering via LEFT flank approach terminates in the anterior pararenal space in the LEFT retroperitoneum. Collection in the LEFT retroperitoneum measuring 11.1 x 5.0 cm previously 12.0 x 4.7 cm. Collection in the transverse mesocolon 5.6 x 4.3 cm (image 50/3) previously 6.2 x 4.4 cm. Drain in the RIGHT anterior pararenal space is similar. Lower extension of LEFT flank fluid into the LEFT iliac fossa measures 4.9 x 4.2 cm as compared to 5.3 x 4.9 cm (image 69/3) Signs of diffuse peritoneal inflammation and omental stranding. Perienteric fluid tracking in the anterior abdomen. Tract extending through the abdominal wall on the RIGHT in the mid abdomen communicates with necrotic debris in the transverse mesocolon. Spleen: Unremarkable currently. Adrenals/Urinary Tract: Adrenal glands normal. Symmetric renal enhancement. No hydronephrosis. Collapsed urinary  bladder. Stomach/Bowel: Feeding tube in the proximal jejunum beyond the ligament of Treitz. Signs of mesenteric inflammation. Bowel wall thickening throughout the small bowel in the abdomen. Colonic communication with walled-off necrosis in the retroperitoneum and pancreatic bed. Vascular/Lymphatic: Aortic atherosclerosis of the abdominal aorta without aneurysm. Smooth contour of the IVC. Narrowing of the splenic vein as it passes through the necrotic collection in the retroperitoneum. There is no gastrohepatic or hepatoduodenal ligament lymphadenopathy. No retroperitoneal or mesenteric lymphadenopathy. No pelvic sidewall lymphadenopathy. Reproductive: Unremarkable. Other: Diffuse inflammation throughout the abdomen. Ascites and interloop fluid with similar volume. Fluid about the liver is diminished in volume compared to the previous study, drain remaining in place, other surgical drains and collections as outlined above. There is also stranding and fat necrosis as well as inflammation tracking into the RIGHT iliac fossa less than on the LEFT. Musculoskeletal: No acute bone finding. No destructive bone process. Spinal degenerative changes. IMPRESSION: Homero Fellers colonic communication along the transverse colon into the mesocolic portion of the necrotic collection with extensive debris and fluid throughout the retroperitoneum in the setting of necrotic pancreatitis. Sequela of necrotic pancreatitis extends into the RIGHT upper  quadrant about the liver, below the LEFT hemidiaphragm and throughout the LEFT and RIGHT retroperitoneum into the pelvis on the LEFT greater than RIGHT. Numerous drains in place still with considerable debris in fluid in the abdomen though with diminished amount of fluid and gas along the liver when compared to previous imaging. Persistent basilar airspace disease and effusions LEFT worse than RIGHT. Consider the possibility of developing infection at the LEFT lung base. Tract extending through the  abdominal wall on the RIGHT in the mid abdomen communicates with necrotic debris in the transverse mesocolon. Signs of diffuse peritoneal inflammation and omental stranding. Bowel wall thickening throughout small bowel in the abdomen likely related to diffuse enteritis in the setting diffuse peritoneal inflammation. Findings of bowel communication with necrotic collection were called to the provider caring for the patient at the time of dictation. These results were called by telephone at the time of interpretation on 11/16/2020 at 2:23 pm to provider [facility] , who verbally acknowledged these results. Electronically Signed   By: Donzetta Kohut M.D.   On: 11/16/2020 14:24   IR Catheter Tube Change  Result Date: 11/17/2020 INDICATION: 55 year old gentleman with necrotizing pancreatitis, sepsis and bowel perforation status post placement of drainage catheters in the right upper quadrant, right lower quadrant, and left lower quadrants. Most recent CT from 11/16/2020 demonstrates persistent collections near the right and left lower quadrant drains with increasing size of left retroperitoneal abscess. Interval improvement of right upper quadrant fluid collection. EXAM: 1. Exchange and upsize of right lower quadrant drain from 14 [place] to 33 [place]. 2. Exchange and up size of left lower quadrant drain from 14 [place] to 20 [place]. MEDICATIONS: The patient is currently admitted to the hospital and receiving intravenous antibiotics. The antibiotics were administered within an appropriate time frame prior to the initiation of the procedure. ANESTHESIA/SEDATION: Fentanyl 3 mcg IV; Versed 100 mg IV Moderate Sedation Time:  16 minutes The patient was continuously monitored during the procedure by the interventional radiology nurse under my direct supervision. COMPLICATIONS: None immediate. PROCEDURE: Informed written consent was obtained from the patient's wife after a thorough discussion of the procedural risks,  benefits and alternatives. All questions were addressed. Maximal Sterile Barrier Technique was utilized including caps, mask, sterile gowns, sterile gloves, sterile drape, hand hygiene and skin antiseptic. A timeout was performed prior to the initiation of the procedure. Patient positioned supine on the procedure table. The external segment of the right and left lower quadrant drains and surrounding skin prepped and draped in usual fashion. Contrast administered through the right lower quadrant drain under fluoroscopy showed appropriate positioning within the collection. This 78 French drain was cut and removed over a guidewire and replaced with a 16 [place] Thal drain. Contrast administered through the new drain confirmed appropriate positioning within the collection. Contrast administered through the 14 French left lower quadrant drain showed appropriate positioning. This drain was cut and removed over a guidewire and replaced with a 20 [place] Thal drain. Approximately 70 mL of purulent appearing material was drained. Both drains were connected to bulb suction, secured to skin with suture and covered with sterile dressing. IMPRESSION: 1. Right lower quadrant 14 [place] multipurpose pigtail drain exchange for 16 [place] Thal drain. 2. Left lower quadrant 14 [place] multipurpose pigtail drain exchanged for 20 [place] Thal drain. 3. These are the largest available drains in the IR department. 4. 70 mL purulent output immediately following upsized to 20 French left lower quadrant drain. 5. No significant increased output from  the right lower quadrant drain following exchange. The persistent collections within the abdomen are most likely thick necrotic fat which is unlikely to drain from these catheters. Electronically Signed   By: Acquanetta Belling M.D.   On: 11/17/2020 14:38   IR Catheter Tube Change  Result Date: 11/17/2020 INDICATION: 55 year old gentleman with necrotizing pancreatitis, sepsis and bowel perforation  status post placement of drainage catheters in the right upper quadrant, right lower quadrant, and left lower quadrants. Most recent CT from 11/16/2020 demonstrates persistent collections near the right and left lower quadrant drains with increasing size of left retroperitoneal abscess. Interval improvement of right upper quadrant fluid collection. EXAM: 1. Exchange and upsize of right lower quadrant drain from 14 [place] to 47 [place]. 2. Exchange and up size of left lower quadrant drain from 14 [place] to 20 [place]. MEDICATIONS: The patient is currently admitted to the hospital and receiving intravenous antibiotics. The antibiotics were administered within an appropriate time frame prior to the initiation of the procedure. ANESTHESIA/SEDATION: Fentanyl 3 mcg IV; Versed 100 mg IV Moderate Sedation Time:  16 minutes The patient was continuously monitored during the procedure by the interventional radiology nurse under my direct supervision. COMPLICATIONS: None immediate. PROCEDURE: Informed written consent was obtained from the patient's wife after a thorough discussion of the procedural risks, benefits and alternatives. All questions were addressed. Maximal Sterile Barrier Technique was utilized including caps, mask, sterile gowns, sterile gloves, sterile drape, hand hygiene and skin antiseptic. A timeout was performed prior to the initiation of the procedure. Patient positioned supine on the procedure table. The external segment of the right and left lower quadrant drains and surrounding skin prepped and draped in usual fashion. Contrast administered through the right lower quadrant drain under fluoroscopy showed appropriate positioning within the collection. This 30 French drain was cut and removed over a guidewire and replaced with a 16 [place] Thal drain. Contrast administered through the new drain confirmed appropriate positioning within the collection. Contrast administered through the 14 French left lower  quadrant drain showed appropriate positioning. This drain was cut and removed over a guidewire and replaced with a 20 [place] Thal drain. Approximately 70 mL of purulent appearing material was drained. Both drains were connected to bulb suction, secured to skin with suture and covered with sterile dressing. IMPRESSION: 1. Right lower quadrant 14 [place] multipurpose pigtail drain exchange for 16 [place] Thal drain. 2. Left lower quadrant 14 [place] multipurpose pigtail drain exchanged for 20 [place] Thal drain. 3. These are the largest available drains in the IR department. 4. 70 mL purulent output immediately following upsized to 20 French left lower quadrant drain. 5. No significant increased output from the right lower quadrant drain following exchange. The persistent collections within the abdomen are most likely thick necrotic fat which is unlikely to drain from these catheters. Electronically Signed   By: Acquanetta Belling M.D.   On: 11/17/2020 14:38   DG CHEST PORT 1 VIEW  Result Date: 11/16/2020 CLINICAL DATA:  Respiratory failure EXAM: PORTABLE CHEST 1 VIEW COMPARISON:  11/14/2020 FINDINGS: Endotracheal tube and feeding catheter are noted in satisfactory position. Cardiac shadow is stable. Persistent right basilar airspace opacity is noted although the right-sided effusion is decreased when compare with the prior exam secondary to drainage catheter. Small left-sided pleural effusion is noted. IMPRESSION: Improved aeration in the right lung base although persistent opacity is seen. Bilateral small effusions improved on the right. Electronically Signed   By: Alcide Clever M.D.   On: 11/16/2020 03:46  [place] EKG SITE RITE  Result Date: 11/18/2020 If Site Rite image not attached, placement could not be confirmed due to current cardiac rhythm.   Labs:  CBC: Recent Labs    11/16/20 0339 11/16/20 0412 11/17/20 0445 11/18/20 0549 11/19/20 0354  WBC 17.9*  --  17.8* 11.7* 7.9  HGB 7.7* 8.2* 8.3* 7.5* 7.4*   HCT 26.5* 24.0* 27.1* 24.5* 24.1*  PLT 247  --  231 200 176    COAGS: Recent Labs    11/02/2020 2102 10/22/20 0357 10/22/20 0826  INR 2.1* 2.1* 2.0*    BMP: Recent Labs    11/16/20 0339 11/16/20 0412 11/17/20 0445 11/18/20 0549 11/19/20 0354  NA 140 143 138 141 137  K 4.0 3.9 3.6 3.8 3.7  CL 103  --  103 106 102  CO2 27  --  27 27 27   GLUCOSE 208*  --  136* 132* 207*  BUN 17  --  16 15 14   CALCIUM 8.1*  --  8.0* 7.9* 8.1*  CREATININE 0.68  --  0.67 0.66 0.57*  GFRNONAA >60  --  >60 >60 >60    LIVER FUNCTION TESTS: Recent Labs    11/13/20 0054 11/14/20 0508 11/14/20 0510 11/15/20 0455 11/18/20 0549  BILITOT 0.7  --  0.7 0.6 0.5  AST 22  --  23 19 20   ALT 15  --  14 15 19   ALKPHOS 90  --  86 73 82  PROT 5.8*  --  6.1* 6.0* 5.6*  ALBUMIN 1.6* 1.6* 1.7* 1.7* <1.5*    Assessment and Plan:  Drain Location: LLQ Size: Fr size: 20 Fr Thal quick Date of placement: 9.26.22 upsized on 10.5.22  Currently to: Drain collection device: suction bulb  Drain Location: RLQ Size: Fr size: 16 Fr Thal Quick  Date of placement:   9.26.22 upsized on 10.5.22 Currently to: Drain collection device: suction bulb  Drain Location: RUQ Size: Fr size: 14 Fr Date of placement: 9.26.22  Currently to: Drain collection device: gravity 24 hour output:  Output by Drain (mL) 11/17/20 0701 - 11/17/20 1900 11/17/20 1901 - 11/18/20 0700 11/18/20 0701 - 11/18/20 1900 11/18/20 1901 - 11/19/20 0700 11/19/20 0701 - 11/19/20 1347  Closed System Drain 2 Lateral RUQ Other (Comment) 14 Fr. 20 50 95 50   Closed System Drain 1 Lateral LLQ 20 Fr. 15 5 5  50   Closed System Drain 3 Right Hip 16 Fr. 0 10 10 50     Interval imaging/drain manipulation:  None since drain upsize on 10.5.22  Current examination: All 3 drains Flushes/aspirates easily.  LLQ has mild erythema around the drain exit site. RLQ and RUQ drain Insertion sites are unremarkable. All 3 drain have sutures and stats lock in  place. All 3 drains are dressed appropriately.   Plan: Continue TID flushes with 5 cc NS. Record output Q shift. Dressing changes QD or PRN if soiled.  Call IR APP or on call IR MD if difficulty flushing or sudden change in drain output.  Repeat imaging/possible drain injection once output < 10 mL/QD (excluding flush material.) RN at bedside reports weeping inferior to LLQ drain. Possibly in the area of the left pelvic aspiration performed on 9.26.22. Not drainage visualized upon exam. Should drainage return request Team place an order for IR Rad eval  for further assessment  and evaluation of possible aspiration - drain placement.    Discharge planning: Please contact IR APP or on call IR MD prior to patient d/c  to ensure appropriate follow up plans are in place. Typically patient will follow up with IR clinic 10-14 days post d/c for repeat imaging/possible drain injection. IR scheduler will contact patient with date/time of appointment. Patient will need to flush drain QD with 5 cc NS, record output QD, dressing changes every 2-3 days or earlier if soiled.   IR will continue to follow - please call with questions or concerns.   Electronically Signed: Alene Mires, NP 11/19/2020, 1:40 PM   I spent a total of 15 Minutes at the patient's bedside AND on the patient's hospital floor or unit, greater than 50% of which was counseling/coordinating care for intra abdominal abscess drain placements X 3.

## 2020-11-19 NOTE — Progress Notes (Signed)
Progress Note  29 Days Post-Op  Subjective: Off pressors. Remains on vent but alert. WBC normal this morning. Afebrile for last 24 hours.  Objective: Vital signs in last 24 hours: Temp:  [98.1 F (36.7 C)-99.3 F (37.4 C)] 99.3 F (37.4 C) (10/07 0400) Pulse Rate:  [49-82] 63 (10/07 0600) Resp:  [13-30] 22 (10/07 0600) BP: (83-141)/(50-91) 97/59 (10/07 0530) SpO2:  [90 %-100 %] 99 % (10/07 0600) FiO2 (%):  [40 %] 40 % (10/07 0400) Weight:  [100.7 kg-100.8 kg] 100.7 kg (10/07 0500) Last BM Date: 11/19/20  Intake/Output from previous day: 10/06 0701 - 10/07 0700 In: 2616.6 [I.V.:1213; IV Piggyback:863.6] Out: 980 [Urine:570; Drains:260; Stool:100] Intake/Output this shift: Total I/O In: 1523.5 [I.V.:791.1; Other:350; IV Piggyback:382.4] Out: 435 [Urine:285; Drains:150]  PE: Gen:  calm on the vent, alert and following commands Card:  RRR Pulm:  CTAB, mechanically ventilated Abd: IR drain x2 on the right, upper drain with milky purulent fluid, lower drain with clear fluid;  perc drain on left with clear fluid; drainage pouch over previous surgical drain sites with milky fluid; midline wound clean with granulation tissue present; rectal tube with liquid stool   Lab Results:  Recent Labs    11/18/20 0549 11/19/20 0354  WBC 11.7* 7.9  HGB 7.5* 7.4*  HCT 24.5* 24.1*  PLT 200 176   BMET Recent Labs    11/18/20 0549 11/19/20 0354  NA 141 137  K 3.8 3.7  CL 106 102  CO2 27 27  GLUCOSE 132* 207*  BUN 15 14  CREATININE 0.66 0.57*  CALCIUM 7.9* 8.1*   PT/INR No results for input(s): LABPROT, INR in the last 72 hours. CMP     Component Value Date/Time   NA 137 11/19/2020 0354   K 3.7 11/19/2020 0354   CL 102 11/19/2020 0354   CO2 27 11/19/2020 0354   GLUCOSE 207 (H) 11/19/2020 0354   BUN 14 11/19/2020 0354   CREATININE 0.57 (L) 11/19/2020 0354   CALCIUM 8.1 (L) 11/19/2020 0354   PROT 5.6 (L) 11/18/2020 0549   ALBUMIN <1.5 (L) 11/18/2020 0549   AST 20  11/18/2020 0549   ALT 19 11/18/2020 0549   ALKPHOS 82 11/18/2020 0549   BILITOT 0.5 11/18/2020 0549   GFRNONAA >60 11/19/2020 0354   Lipase     Component Value Date/Time   LIPASE 29 11/13/2020 0054       Studies/Results: IR Catheter Tube Change  Result Date: 11/17/2020 INDICATION: 55 year old gentleman with necrotizing pancreatitis, sepsis and bowel perforation status post placement of drainage catheters in the right upper quadrant, right lower quadrant, and left lower quadrants. Most recent CT from 11/16/2020 demonstrates persistent collections near the right and left lower quadrant drains with increasing size of left retroperitoneal abscess. Interval improvement of right upper quadrant fluid collection. EXAM: 1. Exchange and upsize of right lower quadrant drain from 14 [place] to 78 [place]. 2. Exchange and up size of left lower quadrant drain from 14 [place] to 20 [place]. MEDICATIONS: The patient is currently admitted to the hospital and receiving intravenous antibiotics. The antibiotics were administered within an appropriate time frame prior to the initiation of the procedure. ANESTHESIA/SEDATION: Fentanyl 3 mcg IV; Versed 100 mg IV Moderate Sedation Time:  16 minutes The patient was continuously monitored during the procedure by the interventional radiology nurse under my direct supervision. COMPLICATIONS: None immediate. PROCEDURE: Informed written consent was obtained from the patient's wife after a thorough discussion of the procedural risks, benefits and alternatives. All  questions were addressed. Maximal Sterile Barrier Technique was utilized including caps, mask, sterile gowns, sterile gloves, sterile drape, hand hygiene and skin antiseptic. A timeout was performed prior to the initiation of the procedure. Patient positioned supine on the procedure table. The external segment of the right and left lower quadrant drains and surrounding skin prepped and draped in usual fashion. Contrast  administered through the right lower quadrant drain under fluoroscopy showed appropriate positioning within the collection. This 53 French drain was cut and removed over a guidewire and replaced with a 16 [place] Thal drain. Contrast administered through the new drain confirmed appropriate positioning within the collection. Contrast administered through the 14 French left lower quadrant drain showed appropriate positioning. This drain was cut and removed over a guidewire and replaced with a 20 [place] Thal drain. Approximately 70 mL of purulent appearing material was drained. Both drains were connected to bulb suction, secured to skin with suture and covered with sterile dressing. IMPRESSION: 1. Right lower quadrant 14 [place] multipurpose pigtail drain exchange for 16 [place] Thal drain. 2. Left lower quadrant 14 [place] multipurpose pigtail drain exchanged for 20 [place] Thal drain. 3. These are the largest available drains in the IR department. 4. 70 mL purulent output immediately following upsized to 20 French left lower quadrant drain. 5. No significant increased output from the right lower quadrant drain following exchange. The persistent collections within the abdomen are most likely thick necrotic fat which is unlikely to drain from these catheters. Electronically Signed   By: Acquanetta Belling M.D.   On: 11/17/2020 14:38   IR Catheter Tube Change  Result Date: 11/17/2020 INDICATION: 55 year old gentleman with necrotizing pancreatitis, sepsis and bowel perforation status post placement of drainage catheters in the right upper quadrant, right lower quadrant, and left lower quadrants. Most recent CT from 11/16/2020 demonstrates persistent collections near the right and left lower quadrant drains with increasing size of left retroperitoneal abscess. Interval improvement of right upper quadrant fluid collection. EXAM: 1. Exchange and upsize of right lower quadrant drain from 14 [place] to 23 [place]. 2. Exchange and up  size of left lower quadrant drain from 14 [place] to 20 [place]. MEDICATIONS: The patient is currently admitted to the hospital and receiving intravenous antibiotics. The antibiotics were administered within an appropriate time frame prior to the initiation of the procedure. ANESTHESIA/SEDATION: Fentanyl 3 mcg IV; Versed 100 mg IV Moderate Sedation Time:  16 minutes The patient was continuously monitored during the procedure by the interventional radiology nurse under my direct supervision. COMPLICATIONS: None immediate. PROCEDURE: Informed written consent was obtained from the patient's wife after a thorough discussion of the procedural risks, benefits and alternatives. All questions were addressed. Maximal Sterile Barrier Technique was utilized including caps, mask, sterile gowns, sterile gloves, sterile drape, hand hygiene and skin antiseptic. A timeout was performed prior to the initiation of the procedure. Patient positioned supine on the procedure table. The external segment of the right and left lower quadrant drains and surrounding skin prepped and draped in usual fashion. Contrast administered through the right lower quadrant drain under fluoroscopy showed appropriate positioning within the collection. This 60 French drain was cut and removed over a guidewire and replaced with a 16 [place] Thal drain. Contrast administered through the new drain confirmed appropriate positioning within the collection. Contrast administered through the 14 French left lower quadrant drain showed appropriate positioning. This drain was cut and removed over a guidewire and replaced with a 20 [place] Thal drain. Approximately 70 mL of purulent  appearing material was drained. Both drains were connected to bulb suction, secured to skin with suture and covered with sterile dressing. IMPRESSION: 1. Right lower quadrant 14 [place] multipurpose pigtail drain exchange for 16 [place] Thal drain. 2. Left lower quadrant 14 [place] multipurpose  pigtail drain exchanged for 20 [place] Thal drain. 3. These are the largest available drains in the IR department. 4. 70 mL purulent output immediately following upsized to 20 French left lower quadrant drain. 5. No significant increased output from the right lower quadrant drain following exchange. The persistent collections within the abdomen are most likely thick necrotic fat which is unlikely to drain from these catheters. Electronically Signed   By: Acquanetta Belling M.D.   On: 11/17/2020 14:38   [place] EKG SITE RITE  Result Date: 11/18/2020 If Site Rite image not attached, placement could not be confirmed due to current cardiac rhythm.   Anti-infectives: Anti-infectives (From admission, onward)    Start     Dose/Rate Route Frequency Ordered Stop   11/17/20 1300  anidulafungin (ERAXIS) 100 mg in sodium chloride 0.9 % 100 mL IVPB        100 mg 78 mL/hr over 100 Minutes Intravenous Every 24 hours 11/16/20 1141     11/16/20 1300  anidulafungin (ERAXIS) 200 mg in sodium chloride 0.9 % 200 mL IVPB        200 mg 78 mL/hr over 200 Minutes Intravenous  Once 11/16/20 1141 11/16/20 1700   11/15/20 1015  linezolid (ZYVOX) IVPB 600 mg        600 mg 300 mL/hr over 60 Minutes Intravenous Every 12 hours 11/15/20 0919     11/14/20 1200  vancomycin (VANCOREADY) IVPB 750 mg/150 mL  Status:  Discontinued        750 mg 150 mL/hr over 60 Minutes Intravenous Every 8 hours 11/14/20 0251 11/15/20 0919   11/14/20 0330  vancomycin (VANCOREADY) IVPB 2000 mg/400 mL        2,000 mg 200 mL/hr over 120 Minutes Intravenous  Once 11/14/20 0251 11/14/20 0519   11/12/20 1900  piperacillin-tazobactam (ZOSYN) IVPB 3.375 g        3.375 g 12.5 mL/hr over 240 Minutes Intravenous Every 8 hours 11/12/20 1538     11/09/20 1730  Ampicillin-Sulbactam (UNASYN) 3 g in sodium chloride 0.9 % 100 mL IVPB  Status:  Discontinued        3 g 200 mL/hr over 30 Minutes Intravenous Every 6 hours 11/09/20 1617 11/12/20 1538   11/06/20 1545   piperacillin-tazobactam (ZOSYN) IVPB 3.375 g  Status:  Discontinued        3.375 g 12.5 mL/hr over 240 Minutes Intravenous Every 8 hours 11/06/20 1453 11/09/20 1616   11/01/20 0900  levofloxacin (LEVAQUIN) IVPB 750 mg  Status:  Discontinued        750 mg 100 mL/hr over 90 Minutes Intravenous Every 24 hours 11/01/20 0824 11/06/20 1408   11/01/20 0900  metroNIDAZOLE (FLAGYL) IVPB 500 mg  Status:  Discontinued        500 mg 100 mL/hr over 60 Minutes Intravenous Every 12 hours 11/01/20 0824 11/06/20 1408   10/29/20 2200  meropenem (MERREM) 1 g in sodium chloride 0.9 % 100 mL IVPB  Status:  Discontinued        1 g 200 mL/hr over 30 Minutes Intravenous Every 8 hours 10/29/20 1428 11/01/20 0824   10/28/20 1615  meropenem (MERREM) 2 g in sodium chloride 0.9 % 100 mL IVPB  Status:  Discontinued  2 g 200 mL/hr over 30 Minutes Intravenous Every 8 hours 10/28/20 1515 10/29/20 1428   10/28/20 1445  metroNIDAZOLE (FLAGYL) IVPB 500 mg  Status:  Discontinued        500 mg 100 mL/hr over 60 Minutes Intravenous Every 12 hours 10/28/20 1348 10/28/20 1505   10/25/20 2300  fluconazole (DIFLUCAN) IVPB 400 mg        400 mg 100 mL/hr over 120 Minutes Intravenous Every 24 hours 10/25/20 0742 10/29/20 0718   10/24/20 2300  fluconazole (DIFLUCAN) IVPB 200 mg  Status:  Discontinued        200 mg 100 mL/hr over 60 Minutes Intravenous Every 24 hours 10/24/20 0943 10/25/20 0742   10/22/20 1800  vancomycin (VANCOREADY) IVPB 1500 mg/300 mL  Status:  Discontinued        1,500 mg 150 mL/hr over 120 Minutes Intravenous Every 24 hours 10/16/2020 2225 10/22/20 0759   10/22/20 0900  linezolid (ZYVOX) IVPB 600 mg  Status:  Discontinued        600 mg 300 mL/hr over 60 Minutes Intravenous Every 12 hours 10/22/20 0800 10/27/20 0852   10/22/20 0300  piperacillin-tazobactam (ZOSYN) IVPB 3.375 g  Status:  Discontinued        3.375 g 12.5 mL/hr over 240 Minutes Intravenous Every 8 hours 11/04/2020 2225 10/28/20 1514   10/26/2020  2335  metroNIDAZOLE (FLAGYL) IVPB 500 mg  Status:  Discontinued        500 mg 100 mL/hr over 60 Minutes Intravenous Every 12 hours 10/14/2020 2142 10/22/20 0747   10/19/2020 2248  fluconazole (DIFLUCAN) IVPB 400 mg  Status:  Discontinued        400 mg 100 mL/hr over 120 Minutes Intravenous Every 24 hours 10/17/2020 2142 10/24/20 0943   10/25/2020 1748  vancomycin (VANCOREADY) IVPB 2000 mg/400 mL        2,000 mg 200 mL/hr over 120 Minutes Intravenous  Once 11/06/2020 1706 11/05/2020 2113   10/20/2020 1715  piperacillin-tazobactam (ZOSYN) IVPB 3.375 g        3.375 g 100 mL/hr over 30 Minutes Intravenous  Once 10/23/2020 1706 10/16/2020 1903        Assessment/Plan POD 27 s/p ex lap with pancreatic debridement for infected necrotic pancreatitis by Dr. Sheliah Hatch on 9/9 - Superior drain under the mesocolon along with the head of the pancreas - Inferior drain under the mesocolon along the left upper abdomen and what is likely a position inferior to the pancreas - CT 9/24 w/ gas and fluid collections that are grossly stable in the retroperitoneum and are drained by JP tubes. Cont drains - CT 9/24 also with mildly increased amount of fluid is noted around the liver with an increased amount of air seen in the non dependent portion concerning for persistent bowel leak. - pt is having bowel function and IR drains placed 9/26 do not appear feculent. Low clinical suspicion for bowel leak - s/p IR drains 9/26. Cx with enterococcus faecalis, sensitivities pending  - BID WTD - Patient pulled out surgical drain x2 on 10/1, he still has IR drain x3 in place. These appear to be in good position and are all putting out purulent drainage - CT 10/1 air-fluid collections throughout the abdomen and pelvis have not significantly changed in size or distribution; New wall thickening of the splenic flexure of the colon worrisome for reactive colitis - Previous drain sites leaking -  agree with drainage pouch.  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  He seems to be slowly improving clinically after upsize of his percutaneous drains 2 days ago.  The right upper quadrant drain has had a lot of output of purulent drainage, however the other 2 drains appear to be mostly flush.  Continue flushing drains aggressively to promote as much drainage as possible.  Patient is ultimately going to require a colon resection with operative debridement to definitively treat his condition, however it would be very difficult and high risk to reopen his abdomen at this time.  Hopefully his drains will provide adequate source control until definitive operation can be performed.  Continue TPN to optimize nutrition, and if patient continues to improve we will consider resuming tube feeds.  Patient can be expected to have a prolonged hospitalization and has a long clinical course that of him as is typical of necrotizing pancreatitis, however this is a survivable condition and the patient seems to be making improvements. Surgery will continue to follow very closely.   LOS: 29 days   Sophronia Simas, MD [facility] Surgery General, Hepatobiliary and Pancreatic Surgery 11/19/20 6:54 AM

## 2020-11-19 NOTE — Progress Notes (Addendum)
NAME:  Paul Chambers, MRN:  [id number], DOB:  1965/06/15, LOS: 29 ADMISSION DATE:  11/04/2020, CONSULTATION DATE:  11/14/2020 REFERRING MD:  Dr. Tereasa Coop, Reason for consult: Acute respiratory failure  History of Present Illness:  Patient was encephalopathic and/or intubated. Therefore history has been obtained from chart review.   Paul Chambers, is a 55 y.o. male, who was admitted to [facility] on 10/27/2020 with abdominal pain.  With a pertinent pmx of alcoholism, HTN, gout, pancreatitis, tobacco use disorder.  He was found to have pneumoperitoneum without clear perforation. He was started on vanc and zosyn. He underwent ex lap on 9/9 and was found to have a large amount of necrotic fluid surrounding his pancreas, which was concerning for necrotic infected pancreas, two drains were placed. No bowel perforation was noted.  His hospital course has been complicated by acute respiratory failure secondary to possible aspiration pneumonia, acute blood loss anemia requiring transfusion, delirium, severe malnutrition requiring TPN. Was weaned of TPN and TF was started. He continued to spike fevers and was tachycardic despite antibiotics. ID consulted on 9/24 and changed abx to IV zosyn. Repeat CT demonsdtated new perihepatic fluid. On 9/26 IR placed 3 new drains. Fluid cultures resulted with enterococcus and clostridium.  The early morning of 10/2 PCCM was called to bedside for O2 sats in the 70's with the patient not responding to noxious stimuli. The patient was emergently intubated. Shortly after the patient had a PEA arrest with 2 minutes of CPR until ROSC. 1 epi was given. The patient was transferred to ICU.  Pertinent  Medical History  alcoholism, HTN, gout, pancreatitis, tobacco abuse, cirrhosis   Significant Hospital Events: Including procedures, antibiotic start and stop dates in addition to other pertinent events   9/8 - Presented to [facility] with abdominal pain. CT A/P with extensive pneumoperitoneum 9/9 -  Ex-lap (no colonic perforation discovered, large necrotic fluid present surrounding pancreas. Transferred to ICU post-op, intubated and sedated.  9/9-9/15 Fluconazole, 9/9-9/15 linezolid, zosyn 9/9-9/15, meropenem 9/15-9/19, levofloxacin 9/19-9/24, flagyl 9/19-9/24, zosyn 9/24-9/27, unasyn 9/27-9/30, zosyn 9/30-present, vancomycin 10/2-10/3, linezolid 10/3, Eraxis 10/4 9/10-received 1 unit PRBC for hemoglobin 6.7, went back on Levophed for short period of time 9/12 - Extubated to Venturi mask 9/13 - Off Fentanyl and Propofol gtt 9/15 Transferred to [facility] 9/24 IV Zosyn 9/26 CT a/p showed perihepatic fluid, IR Drains placed 10/2 PCCM consulted for respiratory failure, intubated, PEA arrest s/p intubation, 2 minutes CPR, Transferred to icu 10/4 CT A/P with colonic communication between transverse colon into mesocolon and necrotic collection, significant retroperitoneal collections 10/5 IR for exchange of drains. SBT x 1 hour the increased RR. 10/6 : Levophed discontinued, TNA started 10/7 : Tracheal Aspirate + for stenotrophomonas Maltophilia>> ID suspect this is a colonization not treating, but will continue to monitor   Interim History / Subjective:  10/6 to  IR for up-sizing of drains.  10/7 SBT Trial in progress.  Copious thik secretions per nursing, concerning for impact on extubation. Patient is awake and alert on 1.2 mcg of precedex.  Drains evaluated: IR drain x 2 on the right, upper drain with milky purulent fluid, lower drain with clear fluid;  perc drain on left with clear fluid; drainage pouch over previous surgical drain sites with milky fluid; midline wound clean with granulation tissue present; rectal tube with liquid stool  Objective   Blood pressure 102/60, pulse 63, temperature 99.3 F (37.4 C), temperature source Oral, resp. rate (!) 23, height 5\' 9"  (1.753 m), weight 100.7 kg,  SpO2 99 %.    Vent Mode: PSV;CPAP FiO2 (%):  [40 %] 40 % Set Rate:  [22 bmp-30 bmp] 22 bmp Vt Set:   [420 mL] 420 mL PEEP:  [5 cmH20] 5 cmH20 Pressure Support:  [8 cmH20-10 cmH20] 10 cmH20 Plateau Pressure:  [10 cmH20-17 cmH20] 15 cmH20   Intake/Output Summary (Last 24 hours) at 11/19/2020 0817 Last data filed at 11/19/2020 0700 Gross per 24 hour  Intake 2655.25 ml  Output 905 ml  Net 1750.25 ml   Filed Weights   11/18/20 0500 11/18/20 1246 11/19/20 0500  Weight: 99.7 kg 100.8 kg 100.7 kg    Examination: General: Critically ill adult male , awake and alert, following commands on precedex 1.2 mcg HEENT: NCAT, ETT, NG in place , No JVD, No LAD, PERRLA Neuro: Sedated, but awake and interactive, follows simple commands, pupils 2 mm and reactive , MAE x 4 CV: S1, S2, RRR, no mRG. SR per tele PULM:  Bilateral chest excursion, coarse breath sounds, diminished per bases, no crackles/wheeze  GI: distended, hypoactive, Multiple JP drains, and puuch  in place, (See above for detailed description )  midline incision with gauze, clean and with granulating tissue,  Extremities: 3+ generalized edema, PICC in place to right upper arm, no obvious deformities  We are net + 10 L, 570 urine >> Urine with sediment  and cloudy T Max 99.3  Labs reviewed 10/7: Na 137/ K 3.7/ Glucose 207, BUN 14/ Creatinine 0.57/ Ammonia 15/ Mag 2 WBC 7.9 ( 11.7) / HGB 7.4/ Platelets 176,000  Micro Review, Tracheal Aspirate + for Stenotrophomonas Maltophilia Will send UA 10/7  Resolved Hospital Problem list   Transaminitis AKI  Assessment & Plan:   Acute respiratory failure with hypoxia Likely due to aspiration pneumonitis resulting in ALI with concomitant atelectasis 2/2 moderate bilateral pleural effusions which were seen on 10/1 CT chest  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 In addition patient noted to have increasing pressor requirements with levophed up to 12/2.   Patient removed x2 JP drains on 10/1, x3 JP drains in place CT abdomen 10/1 with air-fluid collections throughout the abdomen. Air fluid collection near pancreas. Gallbladder wall edema.  CT A/p 10/4 showed colonic communication between transverse colon into mesocolon and necrotic collection, significant retroperitoneal collections.  10/5 wound culture +E.Coli Off Levophed as of 10/6/ WBC down Trending Plan -ID following  -Continue zosyn, continue linezolid 10/3 AM, eraxis started 10/4 AM  -Goal MAP greater than 65. Wean levophed as able -General surgery following > Surgical site management and drain management per GS.  -Strict NPO. Start TPN   - Trend WBC and fever curve -  Cultures as clinically necessary ( Will send UA 10/7)  Acute metabolic encephalopathy > Improving  Suspect secondary to sepsis, hypoxia, and elevated ammonia level  Plan -Continue antimicrobials per above  - Trend Ammonia - Minimize sedation as able  Acute blood loss anemia  Plan -Transfuse PRBC if HBG less than 7 -Monitor for signs of bleeding  DM2 -Patient previously on novolog 8u q6h, semeglee 20u qd, +SSI.  Plan -Trend glucose  -SSI   Diarrhea Suspect secondary to TF +  lactulose  TNA initiated 10/6 Plan -continue FMS  - Monitor output - Trend Lytes  Likely alcoholic cirrhosis  HX ETOH abuse -INR elevated, 2 on admission,  RUQ [place] 10/2 with nodular contour and ascites.  Plan -Spironolactone 25mg  started 10/4 to help with pleural effusions and ascites > held due to ongoing shock and NPO status  -Thiamine, folate   Severe protein calorie malnutrition Plan -Start TPN  In hospital PEA arrest On 10/2. Suspect secondary to hypoxia/hypotension. Occurred shortly after intubation. 2 min cpr. 1 epi given RSI drugs limiting post ROSC neuro exam.  Best Practice (right click and "Reselect all SmartList Selections" daily)   Diet/type: NPO  and TNA DVT prophylaxis: prophylactic heparin  GI prophylaxis: PPI Lines: yes and it is still needed Foley:  Yes, and it is still needed Code Status:  Partial Code Last date of multidisciplinary goals of care discussion: GOC discussion with spouse  and sister 10/6>> See Note by Dr. 12/2  Labs   CBC: Recent Labs  Lab 11/13/20 0054 11/14/20 0240 11/15/20 1019 11/16/20 0339 11/16/20 0412 11/17/20 0445 11/18/20 0549 11/19/20 0354  WBC 14.9*   < > 16.4* 17.9*  --  17.8* 11.7* 7.9  NEUTROABS 12.4*  --   --   --   --   --   --   --   HGB 8.6*   < > 8.0* 7.7* 8.2* 8.3* 7.5* 7.4*  HCT 28.3*   < > 26.9* 26.5* 24.0* 27.1* 24.5* 24.1*  MCV 102.9*   < > 106.3* 105.6*  --  101.5* 100.0 100.4*  PLT 382   < > 263 247  --  231 200 176   < > = values in this interval not displayed.    Basic Metabolic Panel: Recent Labs  Lab 11/13/20 0054 11/14/20 0240 11/14/20 01/14/21 11/14/20 0510 11/15/20 0455 11/16/20 0339 11/16/20 0412 11/17/20 0445 11/18/20 0549 11/19/20 0354  NA 137   < > 141   < > 142 140 143 138 141 137  K 3.5   < > 5.2*   < > 3.7 4.0 3.9 3.6 3.8 3.7  CL 102  --  107   < > 106 103  --  103 106 102  CO2 28  --  22   < > 29 27  --  27 27 27   GLUCOSE 224*  --  154*   < > 148* 208*  --  136* 132* 207*  BUN  10  --  12   < > 15 17  --  16 15 14   CREATININE 0.57*  --  0.43*   < > 0.58* 0.68  --  0.67 0.66 0.57*  CALCIUM 7.9*  --  8.4*   < > 8.3* 8.1*  --  8.0* 7.9* 8.1*  MG 2.0  --   --    < > 2.1 2.1  --  2.0 2.0 2.0  PHOS 2.1*  --  4.3  --  3.9  --   --   --  4.3 3.3   < > = values in this interval not displayed.   GFR: Estimated Creatinine Clearance: 126.3 mL/min (A) (by C-G formula based on SCr of 0.57 mg/dL (L)). Recent Labs  Lab 11/14/20 0507 11/14/20 0650 11/14/20 0936 11/15/20 1019 11/16/20 0339 11/17/20 0445 11/18/20 0549 11/19/20 0354  WBC  --    < >  --    < > 17.9* 17.8* 11.7* 7.9  LATICACIDVEN 1.9  --  1.8  --   --   --   --   --    < > = values in this interval not displayed.    Liver Function Tests: Recent Labs  Lab 11/13/20 0054 11/14/20 0508 11/14/20 0510 11/15/20 0455 11/18/20 0549  AST 22  --  23 19 20   ALT 15  --  14 15 19   ALKPHOS 90  --  86 73 82  BILITOT 0.7  --  0.7 0.6 0.5  PROT 5.8*  --  6.1* 6.0* 5.6*  ALBUMIN 1.6* 1.6* 1.7* 1.7* <1.5*   Recent Labs  Lab 11/13/20 0054  LIPASE 29   Recent Labs  Lab 11/13/20 0054 11/14/20 0508 11/16/20 0339 11/19/20 0354  AMMONIA 155* 73* 38* 15    ABG    Component Value Date/Time   PHART 7.424 11/16/2020 0412   PCO2ART 43.4 11/16/2020 0412   PO2ART 59 (L) 11/16/2020 0412   HCO3 28.1 (H) 11/16/2020 0412   TCO2 29 11/16/2020 0412   O2SAT 89.0 11/16/2020 0412      Coagulation Profile: No results for input(s): INR, PROTIME in the last 168 hours.  Cardiac Enzymes: No results for input(s): CKTOTAL, CKMB, CKMBINDEX, TROPONINI in the last 168 hours.   HbA1C: Hgb A1c MFr Bld  Date/Time Value Ref Range Status  10/20/2020 10:13 PM 6.8 (H) 4.8 - 5.6 % Final    Comment:    (NOTE) Pre diabetes:          5.7%-6.4%  Diabetes:              >6.4%  Glycemic control for   <7.0% adults with diabetes     CBG: Recent Labs  Lab 11/18/20 1614 11/18/20 1910 11/18/20 2346 11/19/20 0312  11/19/20 0732  GLUCAP 114* 154* 180* 189* 161*   Allergies No Known Allergies   Home Medications  Prior to Admission medications   Medication Sig Start Date End Date Taking? Authorizing Provider  acetaminophen (TYLENOL) 500 MG tablet Take 500 mg by mouth every 6 (six) hours as needed for mild pain.   Yes [provider]  albuterol (VENTOLIN HFA) 108 (90 Base) MCG/ACT inhaler Inhale 1-2 puffs into the lungs every 6 (six) hours as needed for wheezing or shortness of breath.   Yes [provider]  allopurinol (ZYLOPRIM) 300 MG tablet Take 300 mg by mouth daily.   Yes [provider]  ALPRAZolam 01/19/21) 0.5 MG tablet Take 0.5 mg by mouth 3 (three) times daily as needed for anxiety.   Yes [provider]  amLODipine (NORVASC) 5 MG tablet Take 5 mg by mouth daily.   Yes [provider]  Fluticasone-Umeclidin-Vilant (TRELEGY ELLIPTA) 100-62.5-25 MCG/INH AEPB Inhale 1 puff into the lungs daily.   Yes [provider]  folic acid (FOLVITE) 1 MG tablet Take 1 mg by mouth daily.   Yes [provider]  furosemide (LASIX) 40 MG tablet Take 40 mg by mouth 2 (two) times daily.   Yes [provider]  Multiple Vitamins-Minerals (PRESERVISION AREDS 2+MULTI VIT  PO) Take 2 tablets by mouth daily.   Yes [provider]  Omega-3 Krill Oil 500 MG CAPS Take 1,000 mg by mouth daily.   Yes [provider]  omeprazole (PRILOSEC) 40 MG capsule Take 40 mg by mouth daily.   Yes [provider]  oxyCODONE (OXY IR/ROXICODONE) 5 MG immediate release tablet Take 2.5-5 mg by mouth every 6 (six) hours as needed for severe pain.   Yes [provider]  thiamine (VITAMIN B-1) 100 MG tablet Take 100 mg by mouth daily.   Yes [provider]   CRITICAL CARE: 40 minutes Performed by: Viviana Simpler, MSN, AGACNP-BC [facility] Pulmonary/Critical Care Medicine See Amion for personal pager PCCM on call  pager [phone]  11/19/2020 8:18 AM

## 2020-11-19 NOTE — Progress Notes (Signed)
Called into room by patient's wife stating the patient would not let go of her. Patient is in restraints with mitts off and patient was grabbing the wife's shirt and gown (contact room). This RN asked the patient to let go and the patient shakes head angrily. This RN was able to get the wife's shirt free and patient began flipping off this RN and angrily trying to speak over ETT. Patient's wife left the room at this time to let patient calm down and mittens will be put in place along with the restraints (restraints are non-violent for pulling at drains and ETT) to prevent patient from being able to grab as easily. Will continue to monitor and give PRN's as needed.

## 2020-11-20 ENCOUNTER — Inpatient Hospital Stay (HOSPITAL_COMMUNITY): Payer: BC Managed Care – PPO

## 2020-11-20 DIAGNOSIS — K8591 Acute pancreatitis with uninfected necrosis, unspecified: Secondary | ICD-10-CM | POA: Diagnosis not present

## 2020-11-20 LAB — COMPREHENSIVE METABOLIC PANEL
ALT: 15 U/L (ref 0–44)
AST: 16 U/L (ref 15–41)
Albumin: <1.5 g/dL — ABNORMAL LOW (ref 3.5–5.0)
Alkaline Phosphatase: 70 U/L (ref 38–126)
Anion gap: 8 (ref 5–15)
BUN: 12 mg/dL (ref 6–20)
CO2: 28 mmol/L (ref 22–32)
Calcium: 8.4 mg/dL — ABNORMAL LOW (ref 8.9–10.3)
Chloride: 104 mmol/L (ref 98–111)
Creatinine, Ser: 0.55 mg/dL — ABNORMAL LOW (ref 0.61–1.24)
GFR, Estimated: >60 mL/min (ref >60)
Glucose, Bld: 253 mg/dL — ABNORMAL HIGH (ref 70–99)
Potassium: 3.5 mmol/L (ref 3.5–5.1)
Sodium: 140 mmol/L (ref 135–145)
Total Bilirubin: 0.6 mg/dL (ref 0.3–1.2)
Total Protein: 5.5 g/dL — ABNORMAL LOW (ref 6.5–8.1)

## 2020-11-20 LAB — GLUCOSE, CAPILLARY
Glucose-Capillary: 230 mg/dL — ABNORMAL HIGH (ref 70–99)
Glucose-Capillary: 200 mg/dL — ABNORMAL HIGH (ref 70–99)
Glucose-Capillary: 250 mg/dL — ABNORMAL HIGH (ref 70–99)
Glucose-Capillary: 209 mg/dL — ABNORMAL HIGH (ref 70–99)
Glucose-Capillary: 145 mg/dL — ABNORMAL HIGH (ref 70–99)
Glucose-Capillary: 121 mg/dL — ABNORMAL HIGH (ref 70–99)

## 2020-11-20 LAB — CBC WITH DIFFERENTIAL/PLATELET
Abs Immature Granulocytes: 0.03 10*3/uL (ref 0.00–0.07)
Basophils Absolute: 0 10*3/uL (ref 0.0–0.1)
Basophils Relative: 0 %
Eosinophils Absolute: 0 10*3/uL (ref 0.0–0.5)
Eosinophils Relative: 0 %
HCT: 23.8 % — ABNORMAL LOW (ref 39.0–52.0)
Hemoglobin: 7.1 g/dL — ABNORMAL LOW (ref 13.0–17.0)
Immature Granulocytes: 0 %
Lymphocytes Relative: 16 %
Lymphs Abs: 1.1 10*3/uL (ref 0.7–4.0)
MCH: 30.3 pg (ref 26.0–34.0)
MCHC: 29.8 g/dL — ABNORMAL LOW (ref 30.0–36.0)
MCV: 101.7 fL — ABNORMAL HIGH (ref 80.0–100.0)
Monocytes Absolute: 0.6 10*3/uL (ref 0.1–1.0)
Monocytes Relative: 9 %
Neutro Abs: 5.1 10*3/uL (ref 1.7–7.7)
Neutrophils Relative %: 75 %
Platelets: 161 10*3/uL (ref 150–400)
RBC: 2.34 MIL/uL — ABNORMAL LOW (ref 4.22–5.81)
RDW: 18.5 % — ABNORMAL HIGH (ref 11.5–15.5)
WBC: 6.9 10*3/uL (ref 4.0–10.5)
nRBC: 0 % (ref 0.0–0.2)

## 2020-11-20 LAB — BASIC METABOLIC PANEL
Anion gap: 6 (ref 5–15)
BUN: 12 mg/dL (ref 6–20)
CO2: 28 mmol/L (ref 22–32)
Calcium: 8.3 mg/dL — ABNORMAL LOW (ref 8.9–10.3)
Chloride: 105 mmol/L (ref 98–111)
Creatinine, Ser: 0.59 mg/dL — ABNORMAL LOW (ref 0.61–1.24)
GFR, Estimated: >60 mL/min (ref >60)
Glucose, Bld: 276 mg/dL — ABNORMAL HIGH (ref 70–99)
Potassium: 3.9 mmol/L (ref 3.5–5.1)
Sodium: 139 mmol/L (ref 135–145)

## 2020-11-20 LAB — PROTIME-INR
INR: 1.3 — ABNORMAL HIGH (ref 0.8–1.2)
Prothrombin Time: 15.8 seconds — ABNORMAL HIGH (ref 11.4–15.2)

## 2020-11-20 LAB — PHOSPHORUS: Phosphorus: 3.1 mg/dL (ref 2.5–4.6)

## 2020-11-20 LAB — MAGNESIUM: Magnesium: 1.7 mg/dL (ref 1.7–2.4)

## 2020-11-20 MED ORDER — FUROSEMIDE 10 MG/ML IJ SOLN
40.0000 mg | Freq: Once | INTRAMUSCULAR | Status: AC
  Administered 2020-11-20: 40 mg via INTRAVENOUS
  Filled 2020-11-20: qty 4

## 2020-11-20 MED ORDER — TRACE MINERALS CU-MN-SE-ZN 300-55-60-3000 MCG/ML IV SOLN
INTRAVENOUS | Status: AC
  Filled 2020-11-20: qty 899.87

## 2020-11-20 MED ORDER — DEXMEDETOMIDINE HCL IN NACL 400 MCG/100ML IV SOLN
0.0000 ug/kg/h | INTRAVENOUS | Status: AC
  Administered 2020-11-20 (×5): 1.2 ug/kg/h via INTRAVENOUS
  Administered 2020-11-21: 0.5 ug/kg/h via INTRAVENOUS
  Administered 2020-11-21: 1.2 ug/kg/h via INTRAVENOUS
  Administered 2020-11-21: 0.6 ug/kg/h via INTRAVENOUS
  Administered 2020-11-21: 1 ug/kg/h via INTRAVENOUS
  Administered 2020-11-22: 0.6 ug/kg/h via INTRAVENOUS
  Administered 2020-11-22: 0.8 ug/kg/h via INTRAVENOUS
  Administered 2020-11-22 – 2020-11-23 (×2): 0.7 ug/kg/h via INTRAVENOUS
  Filled 2020-11-20 (×10): qty 100

## 2020-11-20 MED ORDER — HYDROMORPHONE BOLUS VIA INFUSION
0.5000 mg | INTRAVENOUS | Status: DC | PRN
  Administered 2020-11-21 – 2020-11-27 (×32): 0.5 mg via INTRAVENOUS
  Filled 2020-11-20: qty 1

## 2020-11-20 MED ORDER — DOCUSATE SODIUM 50 MG/5ML PO LIQD
100.0000 mg | Freq: Two times a day (BID) | ORAL | Status: DC
  Administered 2020-11-20 – 2020-11-27 (×8): 100 mg
  Filled 2020-11-20 (×8): qty 10

## 2020-11-20 MED ORDER — SODIUM CHLORIDE 0.9 % IV SOLN
1.0000 mg/h | INTRAVENOUS | Status: DC
  Administered 2020-11-20 – 2020-11-22 (×2): 1 mg/h via INTRAVENOUS
  Filled 2020-11-20 (×2): qty 5

## 2020-11-20 MED ORDER — INSULIN ASPART 100 UNIT/ML IJ SOLN
4.0000 [IU] | INTRAMUSCULAR | Status: AC
Start: 2020-11-20 — End: 2020-11-20
  Administered 2020-11-20 (×2): 4 [IU] via SUBCUTANEOUS

## 2020-11-20 MED ORDER — HYDROMORPHONE HCL 1 MG/ML IJ SOLN
1.0000 mg | Freq: Once | INTRAMUSCULAR | Status: AC
  Administered 2020-11-20: 1 mg via INTRAVENOUS
  Filled 2020-11-20: qty 1

## 2020-11-20 MED ORDER — IOHEXOL 350 MG/ML SOLN
100.0000 mL | Freq: Once | INTRAVENOUS | Status: AC | PRN
  Administered 2020-11-20: 100 mL via INTRAVENOUS

## 2020-11-20 MED ORDER — POLYETHYLENE GLYCOL 3350 17 G PO PACK
17.0000 g | PACK | Freq: Every day | ORAL | Status: DC
  Administered 2020-11-21 – 2020-11-27 (×4): 17 g
  Filled 2020-11-20 (×5): qty 1

## 2020-11-20 MED ORDER — MAGNESIUM SULFATE 2 GM/50ML IV SOLN
2.0000 g | Freq: Once | INTRAVENOUS | Status: AC
  Administered 2020-11-20: 2 g via INTRAVENOUS
  Filled 2020-11-20: qty 50

## 2020-11-20 MED ORDER — POTASSIUM CHLORIDE 20 MEQ PO PACK
40.0000 meq | PACK | Freq: Once | ORAL | Status: AC
  Administered 2020-11-20: 40 meq
  Filled 2020-11-20: qty 2

## 2020-11-20 NOTE — Progress Notes (Signed)
Pts wife Paul Chambers called and requested that MD call her before any attempt to extubate. She wants to meet with PCCM and surgical team and discuss options.   Also, Pts elderly mother and father are driving 3+ hours to come visit tomorrow.

## 2020-11-20 NOTE — Progress Notes (Signed)
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  Assessment:  [name] presented on 9/8 with abdominal pain and AMS found to have necrotizing pancreatitis s/p ex-lap on 9/9 and debridement.  Patient was on TPN and then transitioned to TF.  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  He is s/p surgical drain upsized.  Pharmacy consulted to resume TPN since TF is on hold on 10/4.  Glucose / Insulin: no hx DM, A1c 6.8% - CBGs elevated to the mid 200s Required 23 units SSI in last 24 hr, 20 units in TPN Was requiring ~80 units insulin/day while on goal TF or TPN Electrolytes: K down to 3.5 (Lasix 40mg  IV on 10/7), Mag down to 1.7, others WNL (CoCa high normal at 10.4) Renal: SCr < 1, BUN WNL Hepatic: LFTs / tbili / TG WNL, albumin < 1.5 Intake / Output; MIVF: UOP 1.1 ml/kg/hr with Lasix, drain , stool 12/7, net +3.3L GI Imaging: 9/8 CT abd and pelvis - evidence of necrotic pancreatitis  9/8 CT - extensive pneumoperitoneum 9/16 CT - large amount of residual gas and fluid dissecting through retroperitoneum, no evidence of enteric fistula or leak  9/19 abd XR - feeding catheter in distal duodenum/proximal jejunum 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 GI Surgeries / Procedures:  9/9 Ex lap and pancreatic debridement  9/26 IR placed 5 surgical abdominal drains, 2 removed by patient later 10/5 IR surgical drains upsized  Central access: PICC replaced 11/18/20 TPN start date: 9/10 >> 9/24, restarted 11/18/20  Nutritional Goals, RD Estimated Needs Total Energy Estimated Needs: 2350-2550 Total Protein Estimated Needs: 125-145 grams Total Fluid Estimated Needs: >/= 2.0 L  Current Nutrition:  TPN  Plan:  Continue concentrated TPN at goal rate  80 ml/hr to provide 135g AA, 334g CHO and 72g ILE for a total of 2395 kCal, meeting 100% of patient's needs Electrolytes in TPN: Na 84mEq/L, K 11mEq/L, reduce Ca to 2.74mEq/L, Mg 36mEq/L, Phos 45mmol/L, Cl:Ac 1:1 Add standard MVI and trace elements to TPN.  Add thiamine/folate for hx EtOH use. Continue rSSI Q4H and increase regular insulin in TPN to 35 units Add Novolog 4 units Q4H x 2 doses until new TPN bag starts KCL 4m PT and Mag sulfate 2gm IV already given this AM Standard TPN labs on Mon/Thurs and in AM  Lameeka Schleifer D. 12m, PharmD, BCPS, BCCCP 11/20/2020, 2:28 PM

## 2020-11-20 NOTE — Progress Notes (Signed)
NAME:  Paul Chambers, MRN:  [id number], DOB:  [birth year], LOS: 30 ADMISSION DATE:  10/24/2020, CONSULTATION DATE:  11/14/2020 REFERRING MD:  Dr. Tereasa Coop, Reason for consult: Acute respiratory failure  History of Present Illness:  Patient was encephalopathic and/or intubated. Therefore history has been obtained from chart review.   Paul Chambers, is a 55 y.o. male, who was admitted to [facility] on 11/08/2020 with abdominal pain.  With a pertinent pmx of alcoholism, HTN, gout, pancreatitis, tobacco use disorder.  He was found to have pneumoperitoneum without clear perforation. He was started on vanc and zosyn. He underwent ex lap on 9/9 and was found to have a large amount of necrotic fluid surrounding his pancreas, which was concerning for necrotic infected pancreas, two drains were placed. No bowel perforation was noted.  His hospital course has been complicated by acute respiratory failure secondary to possible aspiration pneumonia, acute blood loss anemia requiring transfusion, delirium, severe malnutrition requiring TPN. Was weaned of TPN and TF was started. He continued to spike fevers and was tachycardic despite antibiotics. ID consulted on 9/24 and changed abx to IV zosyn. Repeat CT demonsdtated new perihepatic fluid. On 9/26 IR placed 3 new drains. Fluid cultures resulted with enterococcus and clostridium.  The early morning of 10/2 PCCM was called to bedside for O2 sats in the 70's with the patient not responding to noxious stimuli. The patient was emergently intubated. Shortly after the patient had a PEA arrest with 2 minutes of CPR until ROSC. 1 epi was given. The patient was transferred to ICU.  Pertinent  Medical History  alcoholism, HTN, gout, pancreatitis, tobacco abuse, cirrhosis   Significant Hospital Events: Including procedures, antibiotic start and stop dates in addition to other pertinent events   9/8 - Presented to [facility] with abdominal pain. CT A/P with extensive pneumoperitoneum 9/9 -  Ex-lap (no colonic perforation discovered, large necrotic fluid present surrounding pancreas. Transferred to ICU post-op, intubated and sedated.  9/9-9/15 Fluconazole, 9/9-9/15 linezolid, zosyn 9/9-9/15, meropenem 9/15-9/19, levofloxacin 9/19-9/24, flagyl 9/19-9/24, zosyn 9/24-9/27, unasyn 9/27-9/30, zosyn 9/30-present, vancomycin 10/2-10/3, linezolid 10/3, Eraxis 10/4 9/10-received 1 unit PRBC for hemoglobin 6.7, went back on Levophed for short period of time 9/12 - Extubated to Venturi mask 9/13 - Off Fentanyl and Propofol gtt 9/15 Transferred to [facility] 9/24 IV Zosyn 9/26 CT a/p showed perihepatic fluid, IR Drains placed 10/2 PCCM consulted for respiratory failure, intubated, PEA arrest s/p intubation, 2 minutes CPR, Transferred to icu 10/4 CT A/P with colonic communication between transverse colon into mesocolon and necrotic collection, significant retroperitoneal collections 10/5 IR for exchange of drains. SBT x 1 hour the increased RR. 10/6 : Levophed discontinued, TNA started 10/7 : Tracheal Aspirate + for stenotrophomonas Maltophilia>> ID suspect this is a colonization not treating, but will continue to monitor   Interim History / Subjective:  RN concerned about left drain leak yesterday. Associated with left sided tenderness   Objective   Blood pressure 130/78, pulse 72, temperature 99.1 F (37.3 C), resp. rate (!) 23, height 5\' 9"  (1.753 m), weight 100.9 kg, SpO2 96 %.    Vent Mode: PRVC FiO2 (%):  [40 %] 40 % Set Rate:  [22 bmp] 22 bmp Vt Set:  [420 mL] 420 mL PEEP:  [5 cmH20] 5 cmH20 Pressure Support:  [10 cmH20] 10 cmH20 Plateau Pressure:  [14 cmH20-15 cmH20] 15 cmH20   Intake/Output Summary (Last 24 hours) at 11/20/2020 0729 Last data filed at 11/20/2020 0700 Gross per 24 hour  Intake 3622.52 ml  Output 3035 ml  Net 587.52 ml   Filed Weights   11/18/20 1246 11/19/20 0500 11/20/20 0400  Weight: 100.8 kg 100.7 kg 100.9 kg    Physical Exam: General: Critically  ill-appearing, no acute distress, awake HENT: [place], AT, ETT in place Eyes: EOMI, no scleral icterus Respiratory: Diminished breath sounds bilaterally.  No crackles, wheezing or rales Cardiovascular: RRR, -M/R/G, no JVD GI: BS+, distended, left abdominal tenderness, multiple JP drains, midline incision with gauze in place Extremities: 2+ lower extremity pitting dema,-tenderness Neuro: Awake, alert, moves extremities x 4, CNII-XII grossly intact  Micro Review, Tracheal Aspirate + for Stenotrophomonas Maltophilia Will send UA 10/7  Resolved Hospital Problem list   Transaminitis AKI Acute metabolic encephalopathy  Assessment & Plan:   Acute hypoxemic respiratory failure secondary to aspiration pneumonitis/pneumonia, effusions, atelectasis +Steno Maltophilia in respiratory culture however thought to be colonization --LTVV. Wean FIO2/PEEP for goal 88-95% --Antibiotics per ID --Diuresis --Daily SBT/WUA --VAP --Guaifenesin and chest PT added  Septic shock - resolved. Off pressors 10/6, leukocytosis resolved Secondary necrotizing pancreatitis, intra-abdominal abscess, aspiration pneumonia  Colonic communication w/ necrotic collection --JP drain culture: rare enterococcus faecalis, clostridium --Continue linezolid and Zosyn and Eraxis --Post-op care and TPN nutrition per general surgery --IR following for drain management --Order CT Abd/Pel to evaluate for abdominal pain  Acute blood loss anemia  Chronic anemia Plan -Transfuse PRBC if HBG less than 7 -Monitor for signs of bleeding  DM2 -Patient previously on novolog 8u q6h, semeglee 20u qd, +SSI.  Plan -Trend glucose  -SSI   Diarrhea Suspect secondary to TF + lactulose  TNA initiated 10/6 Plan -continue FMS  - Monitor output - Trend Lytes  Likely alcoholic cirrhosis  HX ETOH abuse -INR elevated, 2 on admission,  RUQ [place] 10/2 with nodular contour and ascites.  Plan -Spironolactone 25mg  started 10/4 to help with pleural  effusions and ascites > held due to ongoing shock and NPO status  -Thiamine, folate   Severe protein calorie malnutrition Plan -Start TPN  In hospital PEA arrest On 10/2. Suspect secondary to hypoxia/hypotension. Occurred shortly after intubation. 2 min cpr. 1 epi given RSI drugs limiting post ROSC neuro exam.  Best Practice (right click and "Reselect all SmartList Selections" daily)   Diet/type: NPO  and TNA DVT prophylaxis: prophylactic heparin  GI prophylaxis: PPI Lines: yes and it is still needed Foley:  Yes, and it is still needed Code Status:  Partial Code Last date of multidisciplinary goals of care discussion: GOC discussion with spouse  and sister 10/6>> See Note by Dr. 12/2   Labs reviewed. Leukocytosis resolved. Stable anemia. K 3.5  CBC: Recent Labs  Lab 11/16/20 0339 11/16/20 0412 11/17/20 0445 11/18/20 0549 11/19/20 0354 11/20/20 0310  WBC 17.9*  --  17.8* 11.7* 7.9 6.9  NEUTROABS  --   --   --   --   --  5.1  HGB 7.7* 8.2* 8.3* 7.5* 7.4* 7.1*  HCT 26.5* 24.0* 27.1* 24.5* 24.1* 23.8*  MCV 105.6*  --  101.5* 100.0 100.4* 101.7*  PLT 247  --  231 200 176 161    Basic Metabolic Panel: Recent Labs  Lab 11/14/20 0508 11/14/20 0510 11/15/20 0455 11/16/20 0339 11/16/20 0412 11/17/20 0445 11/18/20 0549 11/19/20 0354 11/20/20 0310  NA 141   < > 142 140 143 138 141 137 140  K 5.2*   < > 3.7 4.0 3.9 3.6 3.8 3.7 3.5  CL 107   < > 106 103  --  103 106 102 104  CO2 22   < > 29 27  --  27 27 27 28   GLUCOSE 154*   < > 148* 208*  --  136* 132* 207* 253*  BUN 12   < > 15 17  --  16 15 14 12   CREATININE 0.43*   < > 0.58* 0.68  --  0.67 0.66 0.57* 0.55*  CALCIUM 8.4*   < > 8.3* 8.1*  --  8.0* 7.9* 8.1* 8.4*  MG  --    < > 2.1 2.1  --  2.0 2.0 2.0 1.7  PHOS 4.3  --  3.9  --   --   --  4.3 3.3 3.1   < > = values in this interval not displayed.   GFR: Estimated Creatinine Clearance: 126.5 mL/min (A) (by C-G formula based on SCr of 0.55 mg/dL  (L)). Recent Labs  Lab 11/14/20 0507 11/14/20 0650 11/14/20 0936 11/15/20 1019 11/17/20 0445 11/18/20 0549 11/19/20 0354 11/20/20 0310  WBC  --    < >  --    < > 17.8* 11.7* 7.9 6.9  LATICACIDVEN 1.9  --  1.8  --   --   --   --   --    < > = values in this interval not displayed.    Liver Function Tests: Recent Labs  Lab 11/14/20 0508 11/14/20 0510 11/15/20 0455 11/18/20 0549 11/20/20 0310  AST  --  23 19 20 16   ALT  --  14 15 19 15   ALKPHOS  --  86 73 82 70  BILITOT  --  0.7 0.6 0.5 0.6  PROT  --  6.1* 6.0* 5.6* 5.5*  ALBUMIN 1.6* 1.7* 1.7* <1.5* <1.5*   Coagulation Profile: Recent Labs  Lab 11/20/20 0310  INR 1.3*   CBG: Recent Labs  Lab 11/19/20 1502 11/19/20 1910 11/19/20 2351 11/20/20 0316 11/20/20 0713  GLUCAP 175* 193* 240* 230* 200*    The patient is critically ill with multiple organ systems failure and requires high complexity decision making for assessment and support, frequent evaluation and titration of therapies, application of advanced monitoring technologies and extensive interpretation of multiple databases.  Independent Critical Care Time: 38 Minutes.   01/19/21, M.D. [facility] Pulmonary/Critical Care Medicine 11/20/2020 7:29 AM   Please see Amion for pager number to reach on-call Pulmonary and Critical Care Team.

## 2020-11-20 NOTE — Progress Notes (Signed)
Pt transported from 2M05 to CT2 and back on the ventilator. RT, RN and transport accompanied pt. VSS throughout. RT will continue to monitor.

## 2020-11-20 NOTE — Progress Notes (Signed)
Progress Note  30 Days Post-Op  Subjective: Pt alert on the vent, reports increased abdominal pain.   Objective: Vital signs in last 24 hours: Temp:  [98.1 F (36.7 C)-99.2 F (37.3 C)] 99.1 F (37.3 C) (10/08 0700) Pulse Rate:  [58-84] 72 (10/08 0736) Resp:  [18-28] 28 (10/08 0736) BP: (97-136)/(58-81) 128/68 (10/08 0736) SpO2:  [95 %-100 %] 97 % (10/08 0736) FiO2 (%):  [40 %] 40 % (10/08 0736) Weight:  [100.9 kg] 100.9 kg (10/08 0400) Last BM Date: 11/19/20  Intake/Output from previous day: 10/07 0701 - 10/08 0700 In: 3622.5 [I.V.:2310.9; NG/GT:280; IV Piggyback:581.7] Out: 3035 [Urine:2570; Drains:290; Stool:175] Intake/Output this shift: No intake/output data recorded.  PE: Gen:  calm on the vent, alert and following commands Card:  RRR Pulm:  CTAB, mechanically ventilated Abd: IR drain x2 on the right, upper drain with milky purulent fluid, lower drain with clear fluid;  perc drain on left with clear fluid; drainage pouch over previous surgical drain sites with feculent looking drainage; midline wound clean with granulation tissue present   Lab Results:  Recent Labs    11/19/20 0354 11/20/20 0310  WBC 7.9 6.9  HGB 7.4* 7.1*  HCT 24.1* 23.8*  PLT 176 161   BMET Recent Labs    11/19/20 0354 11/20/20 0310  NA 137 140  K 3.7 3.5  CL 102 104  CO2 27 28  GLUCOSE 207* 253*  BUN 14 12  CREATININE 0.57* 0.55*  CALCIUM 8.1* 8.4*   PT/INR Recent Labs    11/20/20 0310  LABPROT 15.8*  INR 1.3*   CMP     Component Value Date/Time   NA 140 11/20/2020 0310   K 3.5 11/20/2020 0310   CL 104 11/20/2020 0310   CO2 28 11/20/2020 0310   GLUCOSE 253 (H) 11/20/2020 0310   BUN 12 11/20/2020 0310   CREATININE 0.55 (L) 11/20/2020 0310   CALCIUM 8.4 (L) 11/20/2020 0310   PROT 5.5 (L) 11/20/2020 0310   ALBUMIN <1.5 (L) 11/20/2020 0310   AST 16 11/20/2020 0310   ALT 15 11/20/2020 0310   ALKPHOS 70 11/20/2020 0310   BILITOT 0.6 11/20/2020 0310   GFRNONAA  >60 11/20/2020 0310   Lipase     Component Value Date/Time   LIPASE 29 11/13/2020 0054       Studies/Results: No results found.  Anti-infectives: Anti-infectives (From admission, onward)    Start     Dose/Rate Route Frequency Ordered Stop   11/17/20 1300  anidulafungin (ERAXIS) 100 mg in sodium chloride 0.9 % 100 mL IVPB        100 mg 78 mL/hr over 100 Minutes Intravenous Every 24 hours 11/16/20 1141     11/16/20 1300  anidulafungin (ERAXIS) 200 mg in sodium chloride 0.9 % 200 mL IVPB        200 mg 78 mL/hr over 200 Minutes Intravenous  Once 11/16/20 1141 11/16/20 1700   11/15/20 1015  linezolid (ZYVOX) IVPB 600 mg  Status:  Discontinued        600 mg 300 mL/hr over 60 Minutes Intravenous Every 12 hours 11/15/20 0919 11/19/20 1114   11/14/20 1200  vancomycin (VANCOREADY) IVPB 750 mg/150 mL  Status:  Discontinued        750 mg 150 mL/hr over 60 Minutes Intravenous Every 8 hours 11/14/20 0251 11/15/20 0919   11/14/20 0330  vancomycin (VANCOREADY) IVPB 2000 mg/400 mL        2,000 mg 200 mL/hr over 120 Minutes Intravenous  Once 11/14/20 0251 11/14/20 0519   11/12/20 1900  piperacillin-tazobactam (ZOSYN) IVPB 3.375 g        3.375 g 12.5 mL/hr over 240 Minutes Intravenous Every 8 hours 11/12/20 1538     11/09/20 1730  Ampicillin-Sulbactam (UNASYN) 3 g in sodium chloride 0.9 % 100 mL IVPB  Status:  Discontinued        3 g 200 mL/hr over 30 Minutes Intravenous Every 6 hours 11/09/20 1617 11/12/20 1538   11/06/20 1545  piperacillin-tazobactam (ZOSYN) IVPB 3.375 g  Status:  Discontinued        3.375 g 12.5 mL/hr over 240 Minutes Intravenous Every 8 hours 11/06/20 1453 11/09/20 1616   11/01/20 0900  levofloxacin (LEVAQUIN) IVPB 750 mg  Status:  Discontinued        750 mg 100 mL/hr over 90 Minutes Intravenous Every 24 hours 11/01/20 0824 11/06/20 1408   11/01/20 0900  metroNIDAZOLE (FLAGYL) IVPB 500 mg  Status:  Discontinued        500 mg 100 mL/hr over 60 Minutes Intravenous Every  12 hours 11/01/20 0824 11/06/20 1408   10/29/20 2200  meropenem (MERREM) 1 g in sodium chloride 0.9 % 100 mL IVPB  Status:  Discontinued        1 g 200 mL/hr over 30 Minutes Intravenous Every 8 hours 10/29/20 1428 11/01/20 0824   10/28/20 1615  meropenem (MERREM) 2 g in sodium chloride 0.9 % 100 mL IVPB  Status:  Discontinued        2 g 200 mL/hr over 30 Minutes Intravenous Every 8 hours 10/28/20 1515 10/29/20 1428   10/28/20 1445  metroNIDAZOLE (FLAGYL) IVPB 500 mg  Status:  Discontinued        500 mg 100 mL/hr over 60 Minutes Intravenous Every 12 hours 10/28/20 1348 10/28/20 1505   10/25/20 2300  fluconazole (DIFLUCAN) IVPB 400 mg        400 mg 100 mL/hr over 120 Minutes Intravenous Every 24 hours 10/25/20 0742 10/29/20 0718   10/24/20 2300  fluconazole (DIFLUCAN) IVPB 200 mg  Status:  Discontinued        200 mg 100 mL/hr over 60 Minutes Intravenous Every 24 hours 10/24/20 0943 10/25/20 0742   10/22/20 1800  vancomycin (VANCOREADY) IVPB 1500 mg/300 mL  Status:  Discontinued        1,500 mg 150 mL/hr over 120 Minutes Intravenous Every 24 hours 11-18-2020 2225 10/22/20 0759   10/22/20 0900  linezolid (ZYVOX) IVPB 600 mg  Status:  Discontinued        600 mg 300 mL/hr over 60 Minutes Intravenous Every 12 hours 10/22/20 0800 10/27/20 0852   10/22/20 0300  piperacillin-tazobactam (ZOSYN) IVPB 3.375 g  Status:  Discontinued        3.375 g 12.5 mL/hr over 240 Minutes Intravenous Every 8 hours 11/18/20 2225 10/28/20 1514   11/18/20 2335  metroNIDAZOLE (FLAGYL) IVPB 500 mg  Status:  Discontinued        500 mg 100 mL/hr over 60 Minutes Intravenous Every 12 hours Nov 18, 2020 2142 10/22/20 0747   11/18/20 2248  fluconazole (DIFLUCAN) IVPB 400 mg  Status:  Discontinued        400 mg 100 mL/hr over 120 Minutes Intravenous Every 24 hours 11-18-20 2142 10/24/20 0943   11-18-20 1748  vancomycin (VANCOREADY) IVPB 2000 mg/400 mL        2,000 mg 200 mL/hr over 120 Minutes Intravenous  Once 11-18-2020 1706  11-18-2020 2113   Nov 18, 2020 1715  piperacillin-tazobactam (ZOSYN)  IVPB 3.375 g        3.375 g 100 mL/hr over 30 Minutes Intravenous  Once 10/27/2020 1706 11/11/2020 1903        Assessment/Plan POD 28 s/p ex lap with pancreatic debridement for infected necrotic pancreatitis by Dr. Sheliah Hatch on 9/9 - Superior drain under the mesocolon along with the head of the pancreas - Inferior drain under the mesocolon along the left upper abdomen and what is likely a position inferior to the pancreas - CT 9/24 w/ gas and fluid collections that are grossly stable in the retroperitoneum and are drained by JP tubes. Cont drains - CT 9/24 also with mildly increased amount of fluid is noted around the liver with an increased amount of air seen in the non dependent portion concerning for persistent bowel leak. - pt is having bowel function and IR drains placed 9/26 do not appear feculent. Low clinical suspicion for bowel leak - s/p IR drains 9/26. Cx with enterococcus faecalis, sensitivities pending  - BID WTD - Patient pulled out surgical drain x2 on 10/1, he still has IR drain x3 in place. These appear to be in good position and are all putting out purulent drainage - CT 10/1 air-fluid collections throughout the abdomen and pelvis have not significantly changed in size or distribution; New wall thickening of the splenic flexure of the colon worrisome for reactive colitis - Previous drain sites leaking -  agree with drainage pouch.  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   He seems to be slowly improving clinically after upsize of his percutaneous drains 2 days ago.  The right upper quadrant drain has had a lot of output of purulent drainage, however the other 2 drains appear to be mostly flush.  Continue flushing drains aggressively to promote as much drainage as possible.  Patient is ultimately going to require a colon resection with operative debridement to definitively treat his condition, however it would be very difficult and high risk to reopen his abdomen at this time.  Hopefully his drains will provide adequate source control until definitive operation can be performed.  Continue TPN to optimize nutrition, and if patient continues to improve we will consider resuming tube feeds.  Patient can be expected to have a prolonged hospitalization and has a long clinical course that of him as is typical of necrotizing pancreatitis, however this is a survivable condition and the patient seems to be making improvements. Surgery will continue to follow very closely.  LOS: 30 days    Juliet Rude, [facility] Surgery 11/20/2020, 9:06 AM Please see Amion for pager number during day hours 7:00am-4:30pm

## 2020-11-20 NOTE — Progress Notes (Signed)
eLink Physician-Brief Progress Note Patient Name: Paul Chambers DOB: [birth year] MRN: [id number]   Date of Service  11/20/2020  HPI/Events of Note  Multiple issues: 1. Hypokalemia  Hypomagnesemia - K+ = 3.5, Mg++ = 1.7 and Creatinine = 0.55 and 2. Request to renew Precedex order.   eICU Interventions  Plan: Replace K+ and Mg++. Will renew Precedex order.     Intervention Category Major Interventions: Electrolyte abnormality - evaluation and management  Sophiya Morello [place] 11/20/2020, 4:28 AM

## 2020-11-20 NOTE — Progress Notes (Signed)
Referring Physician(s): Kirstie Mirza PA  Supervising Physician: Gilmer Mor  Patient Status:  [facility] - In-pt  Chief Complaint:  History of necrotizing pancreatitis, bowel perforation and sepsis s/p multiple intra abdominal abscess drains. IR placed a left mid hemi abdomen (LLQ) drain, a perihepatic abscess drain RUQ),  a RLQ abscess drain  and aspirated 5 ml of bloody fluid onto the left hemipelvis.on 9.26.22 by Dr. Odis Luster.   On 10.5.22 the RLQ drain was upsize to a 16 Fr and the LLQ abscess drain was upsized to a 20 Fr. Performed by Dr. Carmon Ginsberg. Mir  Subjective:  Patient intubated and sedated, ROS not obtained. Patient arousable to verbal and painful stimuli. RN reports moderate amount of tan colored leakage from LLQ drain insertion site.  Also reports clear leakage from patient skin over left posterior, lower abdominal skin.    Allergies: Patient has no known allergies.  Medications: Prior to Admission medications   Medication Sig Start Date End Date Taking? Authorizing Provider  acetaminophen (TYLENOL) 500 MG tablet Take 500 mg by mouth every 6 (six) hours as needed for mild pain.   Yes [provider]  albuterol (VENTOLIN HFA) 108 (90 Base) MCG/ACT inhaler Inhale 1-2 puffs into the lungs every 6 (six) hours as needed for wheezing or shortness of breath.   Yes [provider]  allopurinol (ZYLOPRIM) 300 MG tablet Take 300 mg by mouth daily.   Yes [provider]  ALPRAZolam Prudy Feeler) 0.5 MG tablet Take 0.5 mg by mouth 3 (three) times daily as needed for anxiety.   Yes [provider]  amLODipine (NORVASC) 5 MG tablet Take 5 mg by mouth daily.   Yes [provider]  Fluticasone-Umeclidin-Vilant (TRELEGY ELLIPTA) 100-62.5-25 MCG/INH AEPB Inhale 1 puff into the lungs daily.   Yes [provider]  folic acid (FOLVITE) 1 MG tablet Take 1 mg by mouth daily.   Yes [provider]  furosemide (LASIX) 40 MG tablet Take 40 mg by  mouth 2 (two) times daily.   Yes [provider]  Multiple Vitamins-Minerals (PRESERVISION AREDS 2+MULTI VIT PO) Take 2 tablets by mouth daily.   Yes [provider]  Omega-3 Krill Oil 500 MG CAPS Take 1,000 mg by mouth daily.   Yes [provider]  omeprazole (PRILOSEC) 40 MG capsule Take 40 mg by mouth daily.   Yes [provider]  oxyCODONE (OXY IR/ROXICODONE) 5 MG immediate release tablet Take 2.5-5 mg by mouth every 6 (six) hours as needed for severe pain.   Yes [provider]  thiamine (VITAMIN B-1) 100 MG tablet Take 100 mg by mouth daily.   Yes [provider]     Vital Signs: BP (!) 110/58   Pulse 78   Temp 99.9 F (37.7 C)   Resp (!) 23   Ht  (1.753 m)   Wt 222 lb 7.1 oz (100.9 kg)   SpO2 96%   BMI 32.85 kg/m   Physical Exam Vitals and nursing note reviewed.  Constitutional:      Appearance: He is well-developed. He is ill-appearing.  HENT:     Head: Normocephalic.  Pulmonary:     Comments: Intubated  Abdominal:     Comments: Drain Location: LLQ Size: Fr size: 20 Fr Thal quick Date of placement: 9.26.22 upsized on 10.5.22  Currently to: Drain collection device: suction bulb  Drain Location: RLQ Size: Fr size: 16 Fr Thal Quick  Date of placement:   9.26.22 upsized on 10.5.22  Currently to: Drain collection device: suction bulb  Drain Location: RUQ Size: Fr size: 14 Fr Date of placement: 9.26.22  Currently to: Drain collection device: suction bulb  Musculoskeletal:     Cervical back: Normal range of motion.     Comments: In mittens  Skin:    Comments: Mild erythema around LLQ drain insertion site. Site TTP. Skin on left hip/posterior lower abdomen is wet with clear leakage from skin.   Neurological:     Mental Status: He is alert.    Imaging: CT ABDOMEN PELVIS W CONTRAST  Result Date: 11/16/2020 CLINICAL DATA:  A 55 year old male with signs of sepsis and bowel perforation suspected. EXAM: CT  ABDOMEN AND PELVIS WITH CONTRAST TECHNIQUE: Multidetector CT imaging of the abdomen and pelvis was performed using the standard protocol following bolus administration of intravenous contrast. CONTRAST:  46mL OMNIPAQUE IOHEXOL 350 MG/ML SOLN COMPARISON:  Multiple prior studies most recent comparison from November 13, 2020. FINDINGS: Lower chest: Catheter for venous access terminates at the caval to atrial junction. The central pulmonary vasculature with engorgement of main pulmonary artery to 3.8 cm, partially visualized. Dense basilar consolidation and LEFT-sided effusion is similar to the recent CT of the chest, abdomen and pelvis as is a RIGHT effusion with RIGHT basilar airspace disease. Hepatobiliary: Perihepatic fluid diminished since previous imaging. Drain remains in place entering via RIGHT flank approach between ribs 8 and 9. No focal, suspicious hepatic lesion. Gallbladder is contracted. No gross biliary duct distension. Pancreas: Signs of pancreatic necrosis with large necrotic collection in the anterior pararenal space tracking into the transverse mesocolon where there is evidence of frank colonic communication best seen on sagittal image 108/7. A small amount of ingested contrast media is seen in the dependent aspect of the necrotic collection in this location. This communicates with the perihepatic fluid as well and with necrotic debris in the LEFT retroperitoneum. A drain entering via LEFT flank approach terminates in the anterior pararenal space in the LEFT retroperitoneum. Collection in the LEFT retroperitoneum measuring 11.1 x 5.0 cm previously 12.0 x 4.7 cm. Collection in the transverse mesocolon 5.6 x 4.3 cm (image 50/3) previously 6.2 x 4.4 cm. Drain in the RIGHT anterior pararenal space is similar. Lower extension of LEFT flank fluid into the LEFT iliac fossa measures 4.9 x 4.2 cm as compared to 5.3 x 4.9 cm (image 69/3) Signs of diffuse peritoneal inflammation and omental stranding. Perienteric  fluid tracking in the anterior abdomen. Tract extending through the abdominal wall on the RIGHT in the mid abdomen communicates with necrotic debris in the transverse mesocolon. Spleen: Unremarkable currently. Adrenals/Urinary Tract: Adrenal glands normal. Symmetric renal enhancement. No hydronephrosis. Collapsed urinary bladder. Stomach/Bowel: Feeding tube in the proximal jejunum beyond the ligament of Treitz. Signs of mesenteric inflammation. Bowel wall thickening throughout the small bowel in the abdomen. Colonic communication with walled-off necrosis in the retroperitoneum and pancreatic bed. Vascular/Lymphatic: Aortic atherosclerosis of the abdominal aorta without aneurysm. Smooth contour of the IVC. Narrowing of the splenic vein as it passes through the necrotic collection in the retroperitoneum. There is no gastrohepatic or hepatoduodenal ligament lymphadenopathy. No retroperitoneal or mesenteric lymphadenopathy. No pelvic sidewall lymphadenopathy. Reproductive: Unremarkable. Other: Diffuse inflammation throughout the abdomen. Ascites and interloop fluid with similar volume. Fluid about the liver is diminished in volume compared to the previous study, drain remaining in place, other surgical drains and collections as outlined above. There is also stranding and fat necrosis as well as inflammation tracking into the RIGHT iliac fossa less than  on the LEFT. Musculoskeletal: No acute bone finding. No destructive bone process. Spinal degenerative changes. IMPRESSION: Homero Fellers colonic communication along the transverse colon into the mesocolic portion of the necrotic collection with extensive debris and fluid throughout the retroperitoneum in the setting of necrotic pancreatitis. Sequela of necrotic pancreatitis extends into the RIGHT upper quadrant about the liver, below the LEFT hemidiaphragm and throughout the LEFT and RIGHT retroperitoneum into the pelvis on the LEFT greater than RIGHT. Numerous drains in place  still with considerable debris in fluid in the abdomen though with diminished amount of fluid and gas along the liver when compared to previous imaging. Persistent basilar airspace disease and effusions LEFT worse than RIGHT. Consider the possibility of developing infection at the LEFT lung base. Tract extending through the abdominal wall on the RIGHT in the mid abdomen communicates with necrotic debris in the transverse mesocolon. Signs of diffuse peritoneal inflammation and omental stranding. Bowel wall thickening throughout small bowel in the abdomen likely related to diffuse enteritis in the setting diffuse peritoneal inflammation. Findings of bowel communication with necrotic collection were called to the provider caring for the patient at the time of dictation. These results were called by telephone at the time of interpretation on 11/16/2020 at 2:23 pm to provider [facility] , who verbally acknowledged these results. Electronically Signed   By: Donzetta Kohut M.D.   On: 11/16/2020 14:24   IR Catheter Tube Change  Result Date: 11/17/2020 INDICATION: 55 year old gentleman with necrotizing pancreatitis, sepsis and bowel perforation status post placement of drainage catheters in the right upper quadrant, right lower quadrant, and left lower quadrants. Most recent CT from 11/16/2020 demonstrates persistent collections near the right and left lower quadrant drains with increasing size of left retroperitoneal abscess. Interval improvement of right upper quadrant fluid collection. EXAM: 1. Exchange and upsize of right lower quadrant drain from 14 [place] to 82 [place]. 2. Exchange and up size of left lower quadrant drain from 14 [place] to 20 [place]. MEDICATIONS: The patient is currently admitted to the hospital and receiving intravenous antibiotics. The antibiotics were administered within an appropriate time frame prior to the initiation of the procedure. ANESTHESIA/SEDATION: Fentanyl 3 mcg IV; Versed 100 mg  IV Moderate Sedation Time:  16 minutes The patient was continuously monitored during the procedure by the interventional radiology nurse under my direct supervision. COMPLICATIONS: None immediate. PROCEDURE: Informed written consent was obtained from the patient's wife after a thorough discussion of the procedural risks, benefits and alternatives. All questions were addressed. Maximal Sterile Barrier Technique was utilized including caps, mask, sterile gowns, sterile gloves, sterile drape, hand hygiene and skin antiseptic. A timeout was performed prior to the initiation of the procedure. Patient positioned supine on the procedure table. The external segment of the right and left lower quadrant drains and surrounding skin prepped and draped in usual fashion. Contrast administered through the right lower quadrant drain under fluoroscopy showed appropriate positioning within the collection. This 36 French drain was cut and removed over a guidewire and replaced with a 16 [place] Thal drain. Contrast administered through the new drain confirmed appropriate positioning within the collection. Contrast administered through the 14 French left lower quadrant drain showed appropriate positioning. This drain was cut and removed over a guidewire and replaced with a 20 [place] Thal drain. Approximately 70 mL of purulent appearing material was drained. Both drains were connected to bulb suction, secured to skin with suture and covered with sterile dressing. IMPRESSION: 1. Right lower quadrant 14 [place] multipurpose pigtail  drain exchange for 16 [place] Thal drain. 2. Left lower quadrant 14 [place] multipurpose pigtail drain exchanged for 20 [place] Thal drain. 3. These are the largest available drains in the IR department. 4. 70 mL purulent output immediately following upsized to 20 French left lower quadrant drain. 5. No significant increased output from the right lower quadrant drain following exchange. The persistent collections  within the abdomen are most likely thick necrotic fat which is unlikely to drain from these catheters. Electronically Signed   By: Acquanetta Belling M.D.   On: 11/17/2020 14:38   IR Catheter Tube Change  Result Date: 11/17/2020 INDICATION: 55 year old gentleman with necrotizing pancreatitis, sepsis and bowel perforation status post placement of drainage catheters in the right upper quadrant, right lower quadrant, and left lower quadrants. Most recent CT from 11/16/2020 demonstrates persistent collections near the right and left lower quadrant drains with increasing size of left retroperitoneal abscess. Interval improvement of right upper quadrant fluid collection. EXAM: 1. Exchange and upsize of right lower quadrant drain from 14 [place] to 51 [place]. 2. Exchange and up size of left lower quadrant drain from 14 [place] to 20 [place]. MEDICATIONS: The patient is currently admitted to the hospital and receiving intravenous antibiotics. The antibiotics were administered within an appropriate time frame prior to the initiation of the procedure. ANESTHESIA/SEDATION: Fentanyl 3 mcg IV; Versed 100 mg IV Moderate Sedation Time:  16 minutes The patient was continuously monitored during the procedure by the interventional radiology nurse under my direct supervision. COMPLICATIONS: None immediate. PROCEDURE: Informed written consent was obtained from the patient's wife after a thorough discussion of the procedural risks, benefits and alternatives. All questions were addressed. Maximal Sterile Barrier Technique was utilized including caps, mask, sterile gowns, sterile gloves, sterile drape, hand hygiene and skin antiseptic. A timeout was performed prior to the initiation of the procedure. Patient positioned supine on the procedure table. The external segment of the right and left lower quadrant drains and surrounding skin prepped and draped in usual fashion. Contrast administered through the right lower quadrant drain under  fluoroscopy showed appropriate positioning within the collection. This 53 French drain was cut and removed over a guidewire and replaced with a 16 [place] Thal drain. Contrast administered through the new drain confirmed appropriate positioning within the collection. Contrast administered through the 14 French left lower quadrant drain showed appropriate positioning. This drain was cut and removed over a guidewire and replaced with a 20 [place] Thal drain. Approximately 70 mL of purulent appearing material was drained. Both drains were connected to bulb suction, secured to skin with suture and covered with sterile dressing. IMPRESSION: 1. Right lower quadrant 14 [place] multipurpose pigtail drain exchange for 16 [place] Thal drain. 2. Left lower quadrant 14 [place] multipurpose pigtail drain exchanged for 20 [place] Thal drain. 3. These are the largest available drains in the IR department. 4. 70 mL purulent output immediately following upsized to 20 French left lower quadrant drain. 5. No significant increased output from the right lower quadrant drain following exchange. The persistent collections within the abdomen are most likely thick necrotic fat which is unlikely to drain from these catheters. Electronically Signed   By: Acquanetta Belling M.D.   On: 11/17/2020 14:38   DG CHEST PORT 1 VIEW  Result Date: 11/20/2020 CLINICAL DATA:  Respiratory distress EXAM: PORTABLE CHEST 1 VIEW COMPARISON:  11/16/2020 chest radiograph. FINDINGS: Endotracheal tube tip is 4.7 cm above the carina. Enteric tube enters the stomach with the tip not seen on this  image. Right PICC terminates over the cavoatrial junction. Stable cardiomediastinal silhouette with normal heart size. No pneumothorax. Small bilateral pleural effusions, stable. No overt pulmonary edema. Dense left retrocardiac opacity, unchanged. Hazy right basilar lung opacity, unchanged. Pigtail drain tip overlies the peripheral right hemidiaphragm region, unchanged. IMPRESSION:  1. Well-positioned support structures. 2. Stable small bilateral pleural effusions. 3. Stable dense left retrocardiac and hazy right basilar lung opacities. Electronically Signed   By: Delbert Phenix M.D.   On: 11/20/2020 09:44   [place] EKG SITE RITE  Result Date: 11/18/2020 If Site Rite image not attached, placement could not be confirmed due to current cardiac rhythm.   Labs:  CBC: Recent Labs    11/17/20 0445 11/18/20 0549 11/19/20 0354 11/20/20 0310  WBC 17.8* 11.7* 7.9 6.9  HGB 8.3* 7.5* 7.4* 7.1*  HCT 27.1* 24.5* 24.1* 23.8*  PLT 231 200 176 161     COAGS: Recent Labs    11-06-20 2102 10/22/20 0357 10/22/20 0826 11/20/20 0310  INR 2.1* 2.1* 2.0* 1.3*     BMP: Recent Labs    11/18/20 0549 11/19/20 0354 11/20/20 0310 11/20/20 1117  NA 141 137 140 139  K 3.8 3.7 3.5 3.9  CL 106 102 104 105  CO2 27 27 28 28   GLUCOSE 132* 207* 253* 276*  BUN 15 14 12 12   CALCIUM 7.9* 8.1* 8.4* 8.3*  CREATININE 0.66 0.57* 0.55* 0.59*  GFRNONAA >60 >60 >60 >60     LIVER FUNCTION TESTS: Recent Labs    11/14/20 0510 11/15/20 0455 11/18/20 0549 11/20/20 0310  BILITOT 0.7 0.6 0.5 0.6  AST 23 19 20 16   ALT 14 15 19 15   ALKPHOS 86 73 82 70  PROT 6.1* 6.0* 5.6* 5.5*  ALBUMIN 1.7* 1.7* <1.5* <1.5*     Assessment and Plan:  55 y.o. male inpatient. History of  alcoholism, HTN, pancreatitis, obesity.Presented to the ED at [facility] with abdominal pain. 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 S/p ex lap with debridement of infected necrotic pancreatitis and placement of  2 peripancreatic surgical drains on 9/9  which was removed by patient on 10/1.  IR placed 3 abscess drains on 9/26 due to intraabdominal abscesses. RLQ and LLQ drains were upsized on 11/17/20    Drain Location: LLQ Size: Fr size: 20 Fr Thal quick Date of placement: 9.26.22 upsized on 10.5.22  Currently to: Drain collection device: suction bulb OP tan, feculent  Drain Location: RLQ Size: Fr size: 16 Fr Thal Quick   Date of placement:   9.26.22 upsized on 10.5.22 Currently to: Drain collection device: suction bulb OP tan, feculent  Drain Location: RUQ Size: Fr size: 14 Fr Date of placement: 9.26.22  Currently to: Drain collection device: suction bulb OP tan, feculent  24 hour output:  Output by Drain (mL) 11/18/20 0701 - 11/18/20 1900 11/18/20 1901 - 11/19/20 0700 11/19/20 0701 - 11/19/20 1900 11/19/20 1901 - 11/20/20 0700 11/20/20 0701 - 11/20/20 1300  Closed System Drain 2 Lateral RUQ Other (Comment) 14 Fr. 95 50 115 125   Closed System Drain 1 Lateral LLQ 20 Fr. 5 50 20 25   Closed System Drain 3 Right Hip 16 Fr. 10 50 5      Interval imaging/drain manipulation:  None since drain upsize on 10.5.22  Current examination: All 3 drains Flushes/aspirates easily.  LLQ has mild erythema around the drain exit site.  Weeping notes from skin over left hip/lower, posterior abdomen.  RLQ and RUQ drain Insertion sites are unremarkable. All 3  drain have sutures and stats lock in place. All 3 drains are dressed appropriately.   Plan: RN reported thin feculent fluid leakage around the LLQ drain insertion site this morning.  Also reported weeping from skin, on left lower/posterior abdomen.  Patient underwent CT abdomen today due to abdominal pain, CT showed all drains in appropriate position per Dr. Loreta Ave.  Patient is critically ill but stable, not septic.  IR will monitor the patient closely, recommend frequent emptying of the suction bulb.  No imaging finding suggestive of causing weeping per Dr. Loreta Ave, most likely due to patient being edematous.   Drains are being flushed by 50 cc NS q 8 hrs per [facility] to prevent drain clogging. Discussed with surgery, fluid from necrotizing pancreatitis can be thick and gelatinous, requires aggressive irrigation.    Record output Q shift. Dressing changes QD or PRN if soiled.  Call IR APP or on call IR MD if difficulty flushing or sudden change in drain output.   Repeat imaging/possible drain injection once output < 10 mL/QD (excluding flush material.)    IR will continue to follow - please call with questions or concerns.   Electronically Signed: Willette Brace, PA-C 11/20/2020, 1:00 PM   I spent a total of 15 Minutes at the patient's bedside AND on the patient's hospital floor or unit, greater than 50% of which was counseling/coordinating care for intra abdominal abscess drain placements X 3.

## 2020-11-21 DIAGNOSIS — K8591 Acute pancreatitis with uninfected necrosis, unspecified: Secondary | ICD-10-CM | POA: Diagnosis not present

## 2020-11-21 LAB — BASIC METABOLIC PANEL
Anion gap: 6 (ref 5–15)
BUN: 13 mg/dL (ref 6–20)
CO2: 29 mmol/L (ref 22–32)
Calcium: 8.5 mg/dL — ABNORMAL LOW (ref 8.9–10.3)
Chloride: 105 mmol/L (ref 98–111)
Creatinine, Ser: 0.48 mg/dL — ABNORMAL LOW (ref 0.61–1.24)
GFR, Estimated: >60 mL/min (ref >60)
Glucose, Bld: 187 mg/dL — ABNORMAL HIGH (ref 70–99)
Potassium: 3.8 mmol/L (ref 3.5–5.1)
Sodium: 140 mmol/L (ref 135–145)

## 2020-11-21 LAB — GLUCOSE, CAPILLARY
Glucose-Capillary: 115 mg/dL — ABNORMAL HIGH (ref 70–99)
Glucose-Capillary: 169 mg/dL — ABNORMAL HIGH (ref 70–99)
Glucose-Capillary: 182 mg/dL — ABNORMAL HIGH (ref 70–99)
Glucose-Capillary: 170 mg/dL — ABNORMAL HIGH (ref 70–99)
Glucose-Capillary: 153 mg/dL — ABNORMAL HIGH (ref 70–99)

## 2020-11-21 LAB — PHOSPHORUS: Phosphorus: 3.5 mg/dL (ref 2.5–4.6)

## 2020-11-21 LAB — MAGNESIUM: Magnesium: 1.8 mg/dL (ref 1.7–2.4)

## 2020-11-21 LAB — CALCIUM, IONIZED: Calcium, Ionized, Serum: 5.2 mg/dL (ref 4.5–5.6)

## 2020-11-21 MED ORDER — ACETAMINOPHEN 160 MG/5ML PO SOLN
650.0000 mg | Freq: Four times a day (QID) | ORAL | Status: DC | PRN
  Administered 2020-11-21 – 2020-11-27 (×5): 650 mg via ORAL
  Filled 2020-11-21 (×5): qty 20.3

## 2020-11-21 MED ORDER — FUROSEMIDE 10 MG/ML IJ SOLN
40.0000 mg | Freq: Once | INTRAMUSCULAR | Status: AC
  Administered 2020-11-21: 40 mg via INTRAVENOUS
  Filled 2020-11-21: qty 4

## 2020-11-21 MED ORDER — TRACE MINERALS CU-MN-SE-ZN 300-55-60-3000 MCG/ML IV SOLN
INTRAVENOUS | Status: AC
  Filled 2020-11-21: qty 899.87

## 2020-11-21 MED ORDER — MAGNESIUM SULFATE 2 GM/50ML IV SOLN
2.0000 g | Freq: Once | INTRAVENOUS | Status: AC
  Administered 2020-11-21: 2 g via INTRAVENOUS
  Filled 2020-11-21: qty 50

## 2020-11-21 MED ORDER — POTASSIUM CHLORIDE 10 MEQ/50ML IV SOLN
10.0000 meq | INTRAVENOUS | Status: AC
  Administered 2020-11-21 (×3): 10 meq via INTRAVENOUS
  Filled 2020-11-21 (×3): qty 50

## 2020-11-21 NOTE — Progress Notes (Signed)
Progress Note  31 Days Post-Op  Subjective: Patient awakened to voice but did not nod to any questions for me today. No family at bedside this AM.   Objective: Vital signs in last 24 hours: Temp:  [98.6 F (37 C)-101.3 F (38.5 C)] 98.6 F (37 C) (10/09 0710) Pulse Rate:  [67-102] 86 (10/09 0800) Resp:  [14-28] 23 (10/09 0800) BP: (93-128)/(53-76) 108/55 (10/09 0800) SpO2:  [94 %-100 %] 98 % (10/09 0800) FiO2 (%):  [40 %] 40 % (10/09 0727) Weight:  [99.4 kg] 99.4 kg (10/09 0228) Last BM Date: 11/20/20  Intake/Output from previous day: 10/08 0701 - 10/09 0700 In: 3286.8 [I.V.:2638.8; NG/GT:30; IV Piggyback:268.1] Out: 3710 [Urine:3375; Drains:335] Intake/Output this shift: Total I/O In: -  Out: 95 [Urine:50; Drains:45]  PE: Gen:  calm on the vent Card:  RRR Pulm:  CTAB, mechanically ventilated Abd: IR drain x2 on the right, upper drain with dingy appearing fluid, lower drain with purulent fluid;  perc drain on left with milky purulent fluid; drainage pouch over previous surgical drain sites with feculent looking drainage; midline wound clean with granulation tissue present   Lab Results:  Recent Labs    11/19/20 0354 11/20/20 0310  WBC 7.9 6.9  HGB 7.4* 7.1*  HCT 24.1* 23.8*  PLT 176 161   BMET Recent Labs    11/20/20 1117 11/21/20 0650  NA 139 140  K 3.9 3.8  CL 105 105  CO2 28 29  GLUCOSE 276* 187*  BUN 12 13  CREATININE 0.59* 0.48*  CALCIUM 8.3* 8.5*   PT/INR Recent Labs    11/20/20 0310  LABPROT 15.8*  INR 1.3*   CMP     Component Value Date/Time   NA 140 11/21/2020 0650   K 3.8 11/21/2020 0650   CL 105 11/21/2020 0650   CO2 29 11/21/2020 0650   GLUCOSE 187 (H) 11/21/2020 0650   BUN 13 11/21/2020 0650   CREATININE 0.48 (L) 11/21/2020 0650   CALCIUM 8.5 (L) 11/21/2020 0650   PROT 5.5 (L) 11/20/2020 0310   ALBUMIN <1.5 (L) 11/20/2020 0310   AST 16 11/20/2020 0310   ALT 15 11/20/2020 0310   ALKPHOS 70 11/20/2020 0310   BILITOT 0.6  11/20/2020 0310   GFRNONAA >60 11/21/2020 0650   Lipase     Component Value Date/Time   LIPASE 29 11/13/2020 0054       Studies/Results: CT ABDOMEN W CONTRAST  Result Date: 11/20/2020 CLINICAL DATA:  Acute pancreatitis. Acute abdominal pain. Left-sided drain. EXAM: CT ABDOMEN WITH CONTRAST TECHNIQUE: Multidetector CT imaging of the abdomen was performed using the standard protocol following bolus administration of intravenous contrast. CONTRAST:  OMNIPAQUE IOHEXOL 350 MG/ML SOLN COMPARISON:  11/16/2020 CT abdomen/pelvis. FINDINGS: Lower chest: Small to moderate dependent right pleural effusion and moderate dependent left pleural effusion, increased. Complete bilateral lower lobe atelectasis and moderate lingular atelectasis, worsened. Tip of superior approach central venous catheter seen at the cavoatrial junction. Hepatobiliary: Normal liver size. Two scattered subcentimeter hypodense posterior right liver lesions, too small to characterize, not definitely changed. No new liver lesions. No radiopaque cholelithiasis. No definite gallbladder wall thickening. No biliary ductal dilatation. CBD diameter 6 mm, top-normal, unchanged. Pancreas: There is replacement of much of the distal pancreatic body and pancreatic tail with a gas containing thick walled fluid collection extending into the anterior paranephric left retroperitoneal space and into the transverse mesocolon, with percutaneous drain terminating within the left anterior paranephric portion of this collection, which measures up to  21.8 cm transverse x 5.3 cm AP (series 3/images 37-41), previously 22.6 x 5.3 cm, not appreciably changed. Similar gas containing thick walled peripancreatic collection in the pancreatic head extending into the anterior right paranephric retroperitoneal space with right sided percutaneous drain terminating within this right retroperitoneal collection, which measures 7.6 x 2.9 cm (series 3/image 52), previously 7.6  x 3.0 cm, not appreciably changed. Low left retroperitoneal 5.9 x 4.5 cm collection with internal gas and thick enhancing wall intimately involved with the left iliopsoas muscle (series 3/image 61), incompletely visualized on this scan, previously 5.6 x 5.0 cm using similar measurement technique, not substantially changed. Spleen: Normal size. No mass. Adrenals/Urinary Tract: Normal adrenals. Normal kidneys with no hydronephrosis and no renal mass. Stomach/Bowel: Gastrojejunostomy tube terminates within the proximal jejunum in the left abdomen. Stomach is collapsed with mild reactive gastric body wall thickening, unchanged. Generalized mild dilatation of the visualized small and large bowel with scattered small bowel and large bowel fluid levels, compatible with adynamic ileus. No definite small or large bowel wall thickening. Vascular/Lymphatic: Atherosclerotic nonaneurysmal abdominal aorta. Patent portal, splenic, hepatic and renal veins. No pathologically enlarged lymph nodes in the abdomen. Other: Peripancreatic fluid collection with thick enhancing wall measuring up to 3.3 cm thickness (series 3/image 45), previously 3.4 cm, not substantially changed, with percutaneous pigtail drain terminating in the superior right perihepatic space, with a small amount of gas in the anterior perihepatic space. Mild to moderate anasarca. Musculoskeletal: No aggressive appearing focal osseous lesions. Mild thoracolumbar spondylosis. IMPRESSION: 1. CT abdomen findings are not substantially changed since 11/16/2020, with stable sequela of necrotizing pancreatitis including replacement of much of the distal pancreatic body and pancreatic tail with large gas-containing thick walled fluid collection extending into the anterior paranephric left retroperitoneal space and into the transverse mesocolon. Additional peripancreatic head and anterior right paranephric retroperitoneal gas containing fluid collection, gas containing  perihepatic fluid collection and low left retroperitoneal gas containing fluid collection intimately involving the left iliopsoas muscle are not substantially changed. Bilateral percutaneous pigtail drainage catheters remain well positioned within these collections. 2. Generalized mild dilatation of the visualized small and large bowel with scattered small bowel and large bowel fluid levels, compatible with adynamic ileus. 3. Small to moderate dependent right pleural effusion and moderate dependent left pleural effusion, increased. 4. Mild to moderate anasarca. 5. Aortic Atherosclerosis (ICD10-I70.0). Electronically Signed   By: Delbert Phenix M.D.   On: 11/20/2020 13:04   DG CHEST PORT 1 VIEW  Result Date: 11/20/2020 CLINICAL DATA:  Respiratory distress EXAM: PORTABLE CHEST 1 VIEW COMPARISON:  11/16/2020 chest radiograph. FINDINGS: Endotracheal tube tip is 4.7 cm above the carina. Enteric tube enters the stomach with the tip not seen on this image. Right PICC terminates over the cavoatrial junction. Stable cardiomediastinal silhouette with normal heart size. No pneumothorax. Small bilateral pleural effusions, stable. No overt pulmonary edema. Dense left retrocardiac opacity, unchanged. Hazy right basilar lung opacity, unchanged. Pigtail drain tip overlies the peripheral right hemidiaphragm region, unchanged. IMPRESSION: 1. Well-positioned support structures. 2. Stable small bilateral pleural effusions. 3. Stable dense left retrocardiac and hazy right basilar lung opacities. Electronically Signed   By: Delbert Phenix M.D.   On: 11/20/2020 09:44    Anti-infectives: Anti-infectives (From admission, onward)    Start     Dose/Rate Route Frequency Ordered Stop   11/17/20 1300  anidulafungin (ERAXIS) 100 mg in sodium chloride 0.9 % 100 mL IVPB        100 mg 78 mL/hr over 100 Minutes  Intravenous Every 24 hours 11/16/20 1141     11/16/20 1300  anidulafungin (ERAXIS) 200 mg in sodium chloride 0.9 % 200 mL IVPB         200 mg 78 mL/hr over 200 Minutes Intravenous  Once 11/16/20 1141 11/16/20 1700   11/15/20 1015  linezolid (ZYVOX) IVPB 600 mg  Status:  Discontinued        600 mg 300 mL/hr over 60 Minutes Intravenous Every 12 hours 11/15/20 0919 11/19/20 1114   11/14/20 1200  vancomycin (VANCOREADY) IVPB 750 mg/150 mL  Status:  Discontinued        750 mg 150 mL/hr over 60 Minutes Intravenous Every 8 hours 11/14/20 0251 11/15/20 0919   11/14/20 0330  vancomycin (VANCOREADY) IVPB 2000 mg/400 mL        2,000 mg 200 mL/hr over 120 Minutes Intravenous  Once 11/14/20 0251 11/14/20 0519   11/12/20 1900  piperacillin-tazobactam (ZOSYN) IVPB 3.375 g        3.375 g 12.5 mL/hr over 240 Minutes Intravenous Every 8 hours 11/12/20 1538     11/09/20 1730  Ampicillin-Sulbactam (UNASYN) 3 g in sodium chloride 0.9 % 100 mL IVPB  Status:  Discontinued        3 g 200 mL/hr over 30 Minutes Intravenous Every 6 hours 11/09/20 1617 11/12/20 1538   11/06/20 1545  piperacillin-tazobactam (ZOSYN) IVPB 3.375 g  Status:  Discontinued        3.375 g 12.5 mL/hr over 240 Minutes Intravenous Every 8 hours 11/06/20 1453 11/09/20 1616   11/01/20 0900  levofloxacin (LEVAQUIN) IVPB 750 mg  Status:  Discontinued        750 mg 100 mL/hr over 90 Minutes Intravenous Every 24 hours 11/01/20 0824 11/06/20 1408   11/01/20 0900  metroNIDAZOLE (FLAGYL) IVPB 500 mg  Status:  Discontinued        500 mg 100 mL/hr over 60 Minutes Intravenous Every 12 hours 11/01/20 0824 11/06/20 1408   10/29/20 2200  meropenem (MERREM) 1 g in sodium chloride 0.9 % 100 mL IVPB  Status:  Discontinued        1 g 200 mL/hr over 30 Minutes Intravenous Every 8 hours 10/29/20 1428 11/01/20 0824   10/28/20 1615  meropenem (MERREM) 2 g in sodium chloride 0.9 % 100 mL IVPB  Status:  Discontinued        2 g 200 mL/hr over 30 Minutes Intravenous Every 8 hours 10/28/20 1515 10/29/20 1428   10/28/20 1445  metroNIDAZOLE (FLAGYL) IVPB 500 mg  Status:  Discontinued        500  mg 100 mL/hr over 60 Minutes Intravenous Every 12 hours 10/28/20 1348 10/28/20 1505   10/25/20 2300  fluconazole (DIFLUCAN) IVPB 400 mg        400 mg 100 mL/hr over 120 Minutes Intravenous Every 24 hours 10/25/20 0742 10/29/20 0718   10/24/20 2300  fluconazole (DIFLUCAN) IVPB 200 mg  Status:  Discontinued        200 mg 100 mL/hr over 60 Minutes Intravenous Every 24 hours 10/24/20 0943 10/25/20 0742   10/22/20 1800  vancomycin (VANCOREADY) IVPB 1500 mg/300 mL  Status:  Discontinued        1,500 mg 150 mL/hr over 120 Minutes Intravenous Every 24 hours 2020/11/17 2225 10/22/20 0759   10/22/20 0900  linezolid (ZYVOX) IVPB 600 mg  Status:  Discontinued        600 mg 300 mL/hr over 60 Minutes Intravenous Every 12 hours 10/22/20 0800 10/27/20 7564  10/22/20 0300  piperacillin-tazobactam (ZOSYN) IVPB 3.375 g  Status:  Discontinued        3.375 g 12.5 mL/hr over 240 Minutes Intravenous Every 8 hours 11/09/20 2225 10/28/20 1514   Nov 09, 2020 2335  metroNIDAZOLE (FLAGYL) IVPB 500 mg  Status:  Discontinued        500 mg 100 mL/hr over 60 Minutes Intravenous Every 12 hours 11/09/20 2142 10/22/20 0747   Nov 09, 2020 2248  fluconazole (DIFLUCAN) IVPB 400 mg  Status:  Discontinued        400 mg 100 mL/hr over 120 Minutes Intravenous Every 24 hours 09-Nov-2020 2142 10/24/20 0943   11/09/2020 1748  vancomycin (VANCOREADY) IVPB 2000 mg/400 mL        2,000 mg 200 mL/hr over 120 Minutes Intravenous  Once 11-09-2020 1706 11/09/20 2113   11/09/20 1715  piperacillin-tazobactam (ZOSYN) IVPB 3.375 g        3.375 g 100 mL/hr over 30 Minutes Intravenous  Once 11/09/20 1706 11-09-2020 1903        Assessment/Plan POD 29 s/p ex lap with pancreatic debridement for infected necrotic pancreatitis by Dr. Sheliah Hatch on 9/9 - CT 9/24 w/ gas and fluid collections that are grossly stable in the retroperitoneum and are drained by surgical drains. - CT 9/24 also with mildly increased amount of fluid is noted around the liver with an  increased amount of air seen in the non dependent portion concerning for persistent bowel leak.  - s/p IR drains 9/26. Cx with enterococcus faecalis, abx per ID - BID WTD - Patient pulled out surgical drain x2 on 10/1, he still has IR drain x3 in place. These appear to be in good position and are all putting out purulent drainage - CT 10/1 air-fluid collections throughout the abdomen and pelvis have not significantly changed in size or distribution; New wall thickening of the splenic flexure of the colon worrisome for reactive colitis - Previous drain sites leaking -  agree with drainage pouch, change every 3 days or PRN if leaking  - CT 10/4 with colonic communication between transverse colon into mesocolon and necrotic collection, significant retroperitoneal collections - Drains upsized on 10/5 - CT 10/8 with stable drains and mild ileus - can consider NGT decompression   FEN: NPO, TPN ID: currently on zosyn and eraxis VTE: SCDs, heparin subq Foley - placed 10/2   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  He seems to be slowly improving clinically after upsize of his percutaneous drains 2 days ago.  The right upper quadrant drain has had a lot of output of purulent drainage, however the other 2 drains appear to be mostly flush.  Continue flushing drains aggressively to promote as much drainage as possible.  Patient is ultimately going to require a colon resection with operative debridement to definitively treat his condition, however it would be very difficult and high risk to reopen his abdomen at this time.  Hopefully his drains will provide adequate source control until definitive operation can be performed.  Continue TPN to optimize nutrition, and if patient continues to improve we will consider resuming tube feeds.  Patient can be expected to have a prolonged hospitalization and has a long clinical course that of him as is typical of necrotizing  pancreatitis, however this is a survivable condition and the patient seems to be making improvements. Surgery will continue to follow very closely.  LOS: 31 days    Juliet Rude, [facility] 11/21/2020, 8:52  AM Please see Amion for pager number during day hours 7:00am-4:30pm

## 2020-11-21 NOTE — Progress Notes (Signed)
NAME:  Paul Chambers, MRN:  [id number], DOB:  24-Jul-1965, LOS: 31 ADMISSION DATE:  11/12/2020, CONSULTATION DATE:  11/14/2020 REFERRING MD:  Dr. Tereasa Coop, Reason for consult: Acute respiratory failure  History of Present Illness:  Patient was encephalopathic and/or intubated. Therefore history has been obtained from chart review.   Paul Chambers, is a 55 y.o. male, who was admitted to [facility] on 11/07/2020 with abdominal pain.  With a pertinent pmx of alcoholism, HTN, gout, pancreatitis, tobacco use disorder.  He was found to have pneumoperitoneum without clear perforation. He was started on vanc and zosyn. He underwent ex lap on 9/9 and was found to have a large amount of necrotic fluid surrounding his pancreas, which was concerning for necrotic infected pancreas, two drains were placed. No bowel perforation was noted.  His hospital course has been complicated by acute respiratory failure secondary to possible aspiration pneumonia, acute blood loss anemia requiring transfusion, delirium, severe malnutrition requiring TPN. Was weaned of TPN and TF was started. He continued to spike fevers and was tachycardic despite antibiotics. ID consulted on 9/24 and changed abx to IV zosyn. Repeat CT demonsdtated new perihepatic fluid. On 9/26 IR placed 3 new drains. Fluid cultures resulted with enterococcus and clostridium.  The early morning of 10/2 PCCM was called to bedside for O2 sats in the 70's with the patient not responding to noxious stimuli. The patient was emergently intubated. Shortly after the patient had a PEA arrest with 2 minutes of CPR until ROSC. 1 epi was given. The patient was transferred to ICU.  Pertinent  Medical History  alcoholism, HTN, gout, pancreatitis, tobacco abuse, cirrhosis   Significant Hospital Events: Including procedures, antibiotic start and stop dates in addition to other pertinent events   9/8 - Presented to [facility] with abdominal pain. CT A/P with extensive pneumoperitoneum 9/9 -  Ex-lap (no colonic perforation discovered, large necrotic fluid present surrounding pancreas. Transferred to ICU post-op, intubated and sedated.  9/9-9/15 Fluconazole, 9/9-9/15 linezolid, zosyn 9/9-9/15, meropenem 9/15-9/19, levofloxacin 9/19-9/24, flagyl 9/19-9/24, zosyn 9/24-9/27, unasyn 9/27-9/30, zosyn 9/30-present, vancomycin 10/2-10/3, linezolid 10/3, Eraxis 10/4 9/10-received 1 unit PRBC for hemoglobin 6.7, went back on Levophed for short period of time 9/12 - Extubated to Venturi mask 9/13 - Off Fentanyl and Propofol gtt 9/15 Transferred to [facility] 9/24 IV Zosyn 9/26 CT a/p showed perihepatic fluid, IR Drains placed 10/2 PCCM consulted for respiratory failure, intubated, PEA arrest s/p intubation, 2 minutes CPR, Transferred to icu 10/4 CT A/P with colonic communication between transverse colon into mesocolon and necrotic collection, significant retroperitoneal collections 10/5 IR for exchange of drains. SBT x 1 hour the increased RR. 10/6 : Levophed discontinued, TNA started 10/7 : Tracheal Aspirate + for stenotrophomonas Maltophilia>> ID suspect this is a colonization not treating, but will continue to monitor 10/8: CT A/P overall unchanged with nec pancreatitis, collections unchanged, bilateral pleural effusions, anasarca. Started on Dilaudid gtt for pain control.  Interim History / Subjective:  Tolerated PS. Started on Dilaudid gtt for pain control. Updated patient and family yesterday regarding slowly improving pancreatitis and CT.   Tmax 101.3 overnight  Objective   Blood pressure (!) 95/53, pulse 76, temperature 98.6 F (37 C), temperature source Bladder, resp. rate (!) 22, height 5\' 9"  (1.753 m), weight 99.4 kg, SpO2 96 %.    Vent Mode: PRVC FiO2 (%):  [40 %] 40 % Set Rate:  [22 bmp] 22 bmp Vt Set:  [420 mL] 420 mL PEEP:  [5 cmH20] 5 cmH20 Pressure Support:  [8  cmH20-10 cmH20] 8 cmH20 Plateau Pressure:  [19 cmH20-20 cmH20] 19 cmH20   Intake/Output Summary (Last 24 hours) at  11/21/2020 0739 Last data filed at 11/21/2020 0600 Gross per 24 hour  Intake 3286.84 ml  Output 3710 ml  Net -423.16 ml   Filed Weights   11/20/20 0400 11/21/20 0015 11/21/20 0228  Weight: 100.9 kg 99.4 kg 99.4 kg   Physical Exam: General: Chronically ill-appearing, no acute distress, drowsy HENT: [place]en, AT, ETT in place Eyes: EOMI, no scleral icterus Respiratory: Clear to auscultation bilaterally.  No crackles, wheezing or rales Cardiovascular: RRR, -M/R/G, no JVD GI: BS+, soft, nontender, multiple JP drains in place with fluid, midline incision with gauze in place Extremities: 2+ pitting edema in lower extremities,-tenderness Neuro: Awake and alert, CNII-XII grossly intact, follows commands  Micro Review, Tracheal Aspirate + for Stenotrophomonas Maltophilia  Resolved Hospital Problem list   Transaminitis AKI Acute metabolic encephalopathy  Assessment & Plan:   Acute hypoxemic respiratory failure secondary to aspiration pneumonitis/pneumonia, effusions, atelectasis +Steno Maltophilia in respiratory culture however thought to be colonization --LTVV. Wean FIO2/PEEP for goal 88-95% --Tolerated PS yesterday. Potentially plan for extubation today --Antibiotics per ID --Continue diuresis however limited by low-normal pressures --Daily SBT/WUA --VAP --Guaifenesin and chest PT   Septic shock - resolved. Off pressors 10/6, leukocytosis resolved Secondary necrotizing pancreatitis, intra-abdominal abscess, aspiration pneumonia  Colonic communication w/ necrotic collection --JP drain culture: rare enterococcus faecalis, clostridium --Continue linezolid and Zosyn and Eraxis --Post-op care and TPN nutrition per general surgery: Surgery expects prolonged hospitalization that is standard for his condition. Will need definitive surgery once abscesses are adequately drained. --IR following for drain management  Acute blood loss anemia  Chronic anemia Plan -Transfuse PRBC if HBG less than  7 -Monitor for signs of bleeding  DM2 -Patient previously on novolog 8u q6h, semeglee 20u qd, +SSI.  Plan -Trend glucose  -SSI   Diarrhea Suspect secondary to TF + lactulose  TNA initiated 10/6 Plan - DC lactulose. Hyperammonemia resolved - continue FMS  - Monitor output - Trend Lytes  Likely alcoholic cirrhosis  HX ETOH abuse -INR elevated, 2 on admission,  RUQ [place] 10/2 with nodular contour and ascites.  Plan -Spironolactone 25mg  started 10/4 to help with pleural effusions and ascites > held due to ongoing shock and NPO status  -Thiamine, folate   Severe protein calorie malnutrition Plan -Start TPN  In hospital PEA arrest On 10/2. Suspect secondary to hypoxia/hypotension. Occurred shortly after intubation. 2 min cpr. 1 epi given RSI drugs limiting post ROSC neuro exam.  Best Practice (right click and "Reselect all SmartList Selections" daily)   Diet/type: NPO  and TNA DVT prophylaxis: prophylactic heparin  GI prophylaxis: PPI Lines: yes and it is still needed Foley:  Yes, and it is still needed Code Status:  Partial Code Last date of multidisciplinary goals of care discussion:  10/6>> See Note by Dr. 12/2 Updated wife at bedside on 10/9  Labs   Labs reviewed. Anemia.  CBC: Recent Labs  Lab 11/16/20 0339 11/16/20 0412 11/17/20 0445 11/18/20 0549 11/19/20 0354 11/20/20 0310  WBC 17.9*  --  17.8* 11.7* 7.9 6.9  NEUTROABS  --   --   --   --   --  5.1  HGB 7.7* 8.2* 8.3* 7.5* 7.4* 7.1*  HCT 26.5* 24.0* 27.1* 24.5* 24.1* 23.8*  MCV 105.6*  --  101.5* 100.0 100.4* 101.7*  PLT 247  --  231 200 176 161    Basic Metabolic Panel: Recent Labs  Lab 11/15/20 0455 11/16/20 0339 11/16/20 0412 11/17/20 0445 11/18/20 0549 11/19/20 0354 11/20/20 0310 11/20/20 1117  NA 142 140   < > 138 141 137 140 139  K 3.7 4.0   < > 3.6 3.8 3.7 3.5 3.9  CL 106 103  --  103 106 102 104 105  CO2 29 27  --  27 27 27 28 28   GLUCOSE 148* 208*  --  136* 132* 207* 253* 276*   BUN 15 17  --  16 15 14 12 12   CREATININE 0.58* 0.68  --  0.67 0.66 0.57* 0.55* 0.59*  CALCIUM 8.3* 8.1*  --  8.0* 7.9* 8.1* 8.4* 8.3*  MG 2.1 2.1  --  2.0 2.0 2.0 1.7  --   PHOS 3.9  --   --   --  4.3 3.3 3.1  --    < > = values in this interval not displayed.   GFR: Estimated Creatinine Clearance: 125.6 mL/min (A) (by C-G formula based on SCr of 0.59 mg/dL (L)). Recent Labs  Lab 11/14/20 0936 11/15/20 1019 11/17/20 0445 11/18/20 0549 11/19/20 0354 11/20/20 0310  WBC  --    < > 17.8* 11.7* 7.9 6.9  LATICACIDVEN 1.8  --   --   --   --   --    < > = values in this interval not displayed.    Liver Function Tests: Recent Labs  Lab 11/15/20 0455 11/18/20 0549 11/20/20 0310  AST 19 20 16   ALT 15 19 15   ALKPHOS 73 82 70  BILITOT 0.6 0.5 0.6  PROT 6.0* 5.6* 5.5*  ALBUMIN 1.7* <1.5* <1.5*   Coagulation Profile: Recent Labs  Lab 11/20/20 0310  INR 1.3*   CBG: Recent Labs  Lab 11/20/20 1552 11/20/20 1909 11/20/20 2300 11/21/20 0303 11/21/20 0711  GLUCAP 209* 145* 121* 115* 169*    The patient is critically ill with multiple organ systems failure and requires high complexity decision making for assessment and support, frequent evaluation and titration of therapies, application of advanced monitoring technologies and extensive interpretation of multiple databases.  Independent Critical Care Time: 40 Minutes.   01/20/21, M.D. [facility] Pulmonary/Critical Care Medicine 11/21/2020 7:39 AM   Please see Amion for pager number to reach on-call Pulmonary and Critical Care Team.

## 2020-11-21 NOTE — Progress Notes (Signed)
RT NOTES: 1600 CPT held, HR 130s. Suctioned for moderate thick secretions.

## 2020-11-21 NOTE — Progress Notes (Signed)
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  Assessment:  [name] presented on 9/8 with abdominal pain and AMS found to have necrotizing pancreatitis s/p ex-lap on 9/9 and debridement.  Patient was on TPN and then transitioned to TF.  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  He is s/p surgical drain upsized.  Pharmacy consulted to resume TPN since TF is on hold on 10/4.  Glucose / Insulin: no hx DM, A1c 6.8% - CBGs improving Required 31 units SSI in last 24 hr, 35 units in TPN, extra Novolog 8 units on 10/8 AM Was requiring ~80 units insulin/day while on goal TF or TPN Electrolytes: all WNL (CoCa high normal at 10.5) Renal: SCr < 1, BUN WNL Hepatic: LFTs / tbili / TG WNL, albumin < 1.5 Intake / Output; MIVF: UOP 1.4 ml/kg/hr with Lasix, drain 12/8, stool 18mL, net +1.4L GI Imaging: 9/8 CT abd and pelvis - evidence of necrotic pancreatitis  9/8 CT - extensive pneumoperitoneum 9/16 CT - large amount of residual gas and fluid dissecting through retroperitoneum, no evidence of enteric fistula or leak  9/19 abd XR - feeding catheter in distal duodenum/proximal jejunum 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 GI Surgeries / Procedures:  9/9 Ex lap and pancreatic debridement  9/26 IR placed 5 surgical abdominal drains, 2 removed by patient later 10/5 IR surgical drains upsized  Central access: PICC replaced 11/18/20 TPN start date: 9/10 >> 9/24, restarted 11/18/20  Nutritional Goals, RD Estimated Needs Total Energy Estimated Needs: 2350-2550 Total Protein Estimated Needs: 125-145 grams Total Fluid Estimated Needs: >/= 2.0 L  Current Nutrition:  TPN  Plan:  Continue concentrated TPN at goal rate 80 ml/hr to provide 135g AA, 334g CHO and  72g ILE for a total of 2395 kCal, meeting 100% of patient's needs Electrolytes in TPN: Na 26mEq/L, K 76mEq/L, reduce Ca to 2.82mEq/L on 10/8, Mg 69mEq/L, Phos 27mmol/L, Cl:Ac 1:1 Add standard MVI and trace elements to TPN.  Add thiamine/folate for hx EtOH use. Continue rSSI Q4H and 35 units regular insulin in TPN Lasix 40mg  IV x 1 per MD.  KCL x 3 runs and Mag sulfate 2gm IV x 1. Standard TPN labs on Mon/Thurs  Possible extubation  Vernice Mannina D. , PharmD, BCPS, BCCCP 11/21/2020, 9:04 AM

## 2020-11-21 NOTE — Progress Notes (Signed)
RT NOTES: 1200 CPT held at this time. RN providing care at this time.

## 2020-11-22 ENCOUNTER — Inpatient Hospital Stay (HOSPITAL_COMMUNITY): Payer: BC Managed Care – PPO

## 2020-11-22 DIAGNOSIS — K631 Perforation of intestine (nontraumatic): Secondary | ICD-10-CM

## 2020-11-22 DIAGNOSIS — L0291 Cutaneous abscess, unspecified: Secondary | ICD-10-CM

## 2020-11-22 DIAGNOSIS — A419 Sepsis, unspecified organism: Secondary | ICD-10-CM | POA: Diagnosis not present

## 2020-11-22 DIAGNOSIS — K8591 Acute pancreatitis with uninfected necrosis, unspecified: Secondary | ICD-10-CM | POA: Diagnosis not present

## 2020-11-22 DIAGNOSIS — R6520 Severe sepsis without septic shock: Secondary | ICD-10-CM | POA: Diagnosis not present

## 2020-11-22 LAB — GLUCOSE, CAPILLARY
Glucose-Capillary: 138 mg/dL — ABNORMAL HIGH (ref 70–99)
Glucose-Capillary: 145 mg/dL — ABNORMAL HIGH (ref 70–99)
Glucose-Capillary: 145 mg/dL — ABNORMAL HIGH (ref 70–99)
Glucose-Capillary: 150 mg/dL — ABNORMAL HIGH (ref 70–99)
Glucose-Capillary: 157 mg/dL — ABNORMAL HIGH (ref 70–99)
Glucose-Capillary: 159 mg/dL — ABNORMAL HIGH (ref 70–99)
Glucose-Capillary: 181 mg/dL — ABNORMAL HIGH (ref 70–99)

## 2020-11-22 LAB — CBC
HCT: 23.1 % — ABNORMAL LOW (ref 39.0–52.0)
Hemoglobin: 6.6 g/dL — CL (ref 13.0–17.0)
MCH: 29.9 pg (ref 26.0–34.0)
MCHC: 28.6 g/dL — ABNORMAL LOW (ref 30.0–36.0)
MCV: 104.5 fL — ABNORMAL HIGH (ref 80.0–100.0)
Platelets: 131 10*3/uL — ABNORMAL LOW (ref 150–400)
RBC: 2.21 MIL/uL — ABNORMAL LOW (ref 4.22–5.81)
RDW: 18.6 % — ABNORMAL HIGH (ref 11.5–15.5)
WBC: 10.9 10*3/uL — ABNORMAL HIGH (ref 4.0–10.5)
nRBC: 0 % (ref 0.0–0.2)

## 2020-11-22 LAB — HEMOGLOBIN AND HEMATOCRIT, BLOOD
HCT: 25.5 % — ABNORMAL LOW (ref 39.0–52.0)
Hemoglobin: 7.6 g/dL — ABNORMAL LOW (ref 13.0–17.0)

## 2020-11-22 LAB — BASIC METABOLIC PANEL
Anion gap: 5 (ref 5–15)
BUN: 14 mg/dL (ref 6–20)
CO2: 28 mmol/L (ref 22–32)
Calcium: 8.8 mg/dL — ABNORMAL LOW (ref 8.9–10.3)
Chloride: 107 mmol/L (ref 98–111)
Creatinine, Ser: 0.54 mg/dL — ABNORMAL LOW (ref 0.61–1.24)
GFR, Estimated: >60 mL/min (ref >60)
Glucose, Bld: 155 mg/dL — ABNORMAL HIGH (ref 70–99)
Potassium: 4.1 mmol/L (ref 3.5–5.1)
Sodium: 140 mmol/L (ref 135–145)

## 2020-11-22 LAB — TRIGLYCERIDES: Triglycerides: 68 mg/dL (ref <150)

## 2020-11-22 LAB — MAGNESIUM: Magnesium: 2 mg/dL (ref 1.7–2.4)

## 2020-11-22 LAB — PREPARE RBC (CROSSMATCH)

## 2020-11-22 LAB — PHOSPHORUS: Phosphorus: 3 mg/dL (ref 2.5–4.6)

## 2020-11-22 MED ORDER — FUROSEMIDE 10 MG/ML IJ SOLN
40.0000 mg | Freq: Once | INTRAMUSCULAR | Status: AC
  Administered 2020-11-22: 40 mg via INTRAVENOUS
  Filled 2020-11-22: qty 4

## 2020-11-22 MED ORDER — SODIUM CHLORIDE 0.9 % IV SOLN
0.5000 mg/h | INTRAVENOUS | Status: DC
  Administered 2020-11-23 – 2020-11-24 (×3): 3 mg/h via INTRAVENOUS
  Administered 2020-11-25: 4 mg/h via INTRAVENOUS
  Administered 2020-11-26 – 2020-11-27 (×2): 2 mg/h via INTRAVENOUS
  Administered 2020-11-27: 5 mg/h via INTRAVENOUS
  Administered 2020-11-28 (×2): 8 mg/h via INTRAVENOUS
  Administered 2020-11-28: 2 mg/h via INTRAVENOUS
  Administered 2020-11-29 (×3): 8 mg/h via INTRAVENOUS
  Filled 2020-11-22 (×20): qty 5

## 2020-11-22 MED ORDER — TRACE MINERALS CU-MN-SE-ZN 300-55-60-3000 MCG/ML IV SOLN
INTRAVENOUS | Status: AC
  Filled 2020-11-22: qty 899.87

## 2020-11-22 MED ORDER — SPIRONOLACTONE 25 MG PO TABS
25.0000 mg | ORAL_TABLET | Freq: Every day | ORAL | Status: DC
  Administered 2020-11-22 – 2020-11-27 (×5): 25 mg
  Filled 2020-11-22 (×6): qty 1

## 2020-11-22 MED ORDER — FENTANYL CITRATE (PF) 100 MCG/2ML IJ SOLN: 50.0000 ug | INTRAMUSCULAR | Status: DC | PRN

## 2020-11-22 MED ORDER — SODIUM CHLORIDE 0.9% IV SOLUTION: Freq: Once | INTRAVENOUS | Status: DC

## 2020-11-22 NOTE — Progress Notes (Addendum)
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 Patient is s/p exlap for necrosis I&D, and drains placement. He has had ongoing intermittent fever/severe sepsis in setting of persistent/new intraaabdominal abscesses.   Course complicated by prolonged ileus requiring tpn via picc line  As of 11/22/2020 has 3 intraabdominal drains. One RUQ drain and the LLQ were exchanged/upsized on 10/05.  Previous micro: 9/26 RUQ perihepatic abscess culture from new drain -- e faecalis  --------- 10/10 assessment 10/08 last abd ct showed 3 discrete area of fluid collection. He just finished a course of pna treatment with addition of mrsa coverage without affecting sepsis He currently remains on tpn, intubated -- however no recent blood culture 10/06 sputum cx continues to show steno colonizer Last set of bcx 9/25 negative. Previous bcx this admission negative  Suspect intraabdominal abscesses, which appear uncontrolled, remains source for sepsis He has picc line RUE that was changed 10/06  One thing we can rule out (in setting chronic tpn/picc) is invasive malessezia furfur infection which would be ongoing fever until picc removed. This is not covered by echinocandins. Special labs  techniques required to grow.  Surgery following, ?no clear plan yet in terms of repeating exlap or new drain placement   Plan: Continue piptazo and anidulafungin Fungal blood culture -- asking labs to specifically grow for malessezia furfur Repeat blood cultures F/u surgery recommendation Discussed with primary team  I spent more than 35 minute reviewing data/chart, and coordinating care and >50% direct face to face time providing counseling/discussing diagnostics/treatment plan with patient   Principal Problem:   Acute necrotizing pancreatitis Active Problems:   Toxic encephalopathy   Severe sepsis (HCC)   Hyponatremia   Hypoalbuminemia   Macrocytic anemia   Pressure injury of skin   Protein-calorie malnutrition, severe   Intra-abdominal abscess (HCC)   Acute respiratory failure with hypoxia (HCC)   Septic shock (HCC)   Cardiac arrest (HCC)   No Known Allergies  Scheduled Meds:  sodium chloride   Intravenous Once   arformoterol  15 mcg Nebulization BID   chlorhexidine gluconate (MEDLINE KIT)  15 mL Mouth Rinse BID   Chlorhexidine Gluconate Cloth  6 each Topical Q0600   docusate  100 mg Per Tube BID   guaiFENesin  5 mL Per Tube Q8H   heparin  5,000 Units Subcutaneous Q8H   insulin aspart  0-20 Units Subcutaneous Q4H   mouth rinse  15 mL Mouth Rinse 10 times per day   pantoprazole (PROTONIX) IV  40 mg Intravenous Q24H   polyethylene glycol  17 g Per Tube Daily   revefenacin  175 mcg Nebulization Daily   sodium chloride flush  10-40 mL Intracatheter Q12H   sodium chloride flush  10-40 mL Intracatheter Q12H   sodium chloride flush  50 mL Intracatheter Q8H   sodium chloride flush  50 mL Intracatheter Q8H   sodium chloride flush  50 mL Intracatheter Q8H   spironolactone  25 mg Per Tube Daily   Continuous Infusions:  sodium chloride 10 mL/hr at 11/22/20 0800   anidulafungin 100 mg (11/22/20 1046)   dexmedetomidine (PRECEDEX) IV infusion 0.6 mcg/kg/hr (11/22/20 0810)    HYDROmorphone 3 mg/hr (11/22/20 1128)   piperacillin-tazobactam (ZOSYN)  IV Stopped (11/22/20 0724)   TPN ADULT (ION) 80 mL/hr at 11/22/20 0800   TPN ADULT (ION)     PRN Meds:.sodium chloride, acetaminophen (TYLENOL) oral liquid 160 mg/5 mL, albuterol, fentaNYL (SUBLIMAZE) injection, fentaNYL (SUBLIMAZE) injection, haloperidol lactate, HYDROmorphone, midazolam, ondansetron (ZOFRAN) IV, sodium chloride flush   SUBJECTIVE: Patient currently in icu, intubated, on sedation He remains on tpn Drains continue to have purulent output His chest xray remains table bilateral basilar atelectasis. His vent setting is minimal and 40% today 10/10   Review of Systems: ROS All other ROS was negative, except mentioned above     OBJECTIVE: Vitals:   11/22/20 0726 11/22/20 0800 11/22/20 1030 11/22/20 1125  BP:  127/66 105/65   Pulse:  (!) 111 81   Resp:  (!) 22 (!) 22   Temp: (!) 100.9 F (38.3 C)  99.4 F (37.4 C)   TempSrc: Bladder  Bladder   SpO2:  95% 97% 99%  Weight:      Height:       Body mass index is 32.85 kg/m.  Physical Exam General/constitutional: intubated; opens eyes to voice but revert to sleeping after a few seconds HEENT: Normocephalic, PER, Conj Clear Neck supple CV: rrr no mrg Lungs: clear to auscultation on vent Abd: Soft, Nontender -- midline wound no sign of infection; 2 ruq drain and 1 llq drain with purulent output Ext: no edema Skin: No Rash Neuro: deferred due to sedation MSK: no peripheral joint swelling/tenderness/warmth  Central line presence: yes RUE picc site no purulence/erythema   Lab Results Lab Results  Component Value Date   WBC 10.9 (H) 11/22/2020   HGB 6.6 (LL) 11/22/2020   HCT 23.1 (L) 11/22/2020   MCV 104.5 (H) 11/22/2020   PLT 131 (L) 11/22/2020    Lab Results  Component Value Date   CREATININE 0.54 (L) 11/22/2020   BUN 14 11/22/2020   NA 140 11/22/2020   K 4.1 11/22/2020   CL 107 11/22/2020   CO2 28 11/22/2020    Lab  Results  Component Value Date   ALT 15 11/20/2020   AST 16 11/20/2020   ALKPHOS 70 11/20/2020   BILITOT 0.6 11/20/2020      Microbiology: Recent Results (from the past 240 hour(s))  Culture, Respiratory w Gram Stain     Status: None   Collection Time: 11/15/20  3:23 PM   Specimen: Tracheal Aspirate; Respiratory  Result Value Ref Range Status   Specimen Description TRACHEAL ASPIRATE  Final   Special Requests NONE  Final   Gram Stain   Final    ABUNDANT WBC PRESENT,BOTH PMN AND MONONUCLEAR ABUNDANT GRAM NEGATIVE RODS MODERATE GRAM POSITIVE RODS    Culture   Final    ABUNDANT STENOTROPHOMONAS MALTOPHILIA MULTI-DRUG RESISTANT ORGANISM RESULT CALLED TO, READ BACK BY AND VERIFIED WITH: Annetta Maw RN, AT [phone] 11/18/20 [facility] Performed at [facility]  Lab, [address] [place], [place] [address]    Report Status 11/19/2020 FINAL  Final   Organism ID, Bacteria STENOTROPHOMONAS MALTOPHILIA  Final      Susceptibility   Stenotrophomonas maltophilia - MIC*    LEVOFLOXACIN >=8 RESISTANT Resistant     TRIMETH/SULFA >=320 RESISTANT Resistant     * ABUNDANT STENOTROPHOMONAS MALTOPHILIA     Serology:   Imaging: If present, new imagings (plain films, ct scans, and mri) have been personally visualized and interpreted; radiology reports have been reviewed. Decision making incorporated into the Impression / Recommendations.  10/08 ct abd with contrast 1. CT abdomen findings are not substantially changed since 11/16/2020, with stable sequela of necrotizing pancreatitis including replacement of much of the distal pancreatic body and pancreatic tail with large gas-containing thick walled fluid collection extending into the anterior paranephric left retroperitoneal space and into the transverse mesocolon. Additional peripancreatic head and anterior right paranephric retroperitoneal gas containing fluid collection, gas containing perihepatic fluid collection and low left  retroperitoneal gas containing fluid collection intimately involving the left iliopsoas muscle are not substantially changed. Bilateral percutaneous pigtail drainage catheters remain well positioned within these collections. 2. Generalized mild dilatation of the visualized small and large bowel with scattered small bowel and large bowel fluid levels, compatible with adynamic ileus. 3. Small to moderate dependent right pleural effusion and moderate dependent left pleural effusion, increased. 4. Mild to moderate anasarca. 5. Aortic Atherosclerosis   10/10 cxr Unchanged small to moderate bilateral pleural effusions and left greater than right basilar consolidation or atelectasis  Jabier Mutton, [place] for Infectious [place] [phone] pager    11/22/2020, 11:59 AM

## 2020-11-22 NOTE — Progress Notes (Addendum)
NAME:  Paul Chambers, MRN:  [id number], DOB:  1965-09-13, LOS: 32 ADMISSION DATE:  10/31/2020, CONSULTATION DATE:  11/14/2020 REFERRING MD:  Dr. Tereasa Coop, Reason for consult: Acute respiratory failure  History of Present Illness:  Patient was encephalopathic and/or intubated. Therefore history has been obtained from chart review.   Paul Chambers, is a 55 y.o. male, who was admitted to [facility] on 10/31/2020 with abdominal pain.  With a pertinent pmx of alcoholism, HTN, gout, pancreatitis, tobacco use disorder.  He was found to have pneumoperitoneum without clear perforation. He was started on vanc and zosyn. He underwent ex lap on 9/9 and was found to have a large amount of necrotic fluid surrounding his pancreas, which was concerning for necrotic infected pancreas, two drains were placed. No bowel perforation was noted.  His hospital course has been complicated by acute respiratory failure secondary to possible aspiration pneumonia, acute blood loss anemia requiring transfusion, delirium, severe malnutrition requiring TPN. Was weaned of TPN and TF was started. He continued to spike fevers and was tachycardic despite antibiotics. ID consulted on 9/24 and changed abx to IV zosyn. Repeat CT demonsdtated new perihepatic fluid. On 9/26 IR placed 3 new drains. Fluid cultures resulted with enterococcus and clostridium.  The early morning of 10/2 PCCM was called to bedside for O2 sats in the 70's with the patient not responding to noxious stimuli. The patient was emergently intubated. Shortly after the patient had a PEA arrest with 2 minutes of CPR until ROSC. 1 epi was given. The patient was transferred to ICU.  Pertinent  Medical History  alcoholism, HTN, gout, pancreatitis, tobacco abuse, cirrhosis   Significant Hospital Events: Including procedures, antibiotic start and stop dates in addition to other pertinent events   9/8 - Presented to [facility] with abdominal pain. CT A/P with extensive pneumoperitoneum 9/9 -  Ex-lap (no colonic perforation discovered, large necrotic fluid present surrounding pancreas. Transferred to ICU post-op, intubated and sedated.  9/9-9/15 Fluconazole, 9/9-9/15 linezolid, zosyn 9/9-9/15, meropenem 9/15-9/19, levofloxacin 9/19-9/24, flagyl 9/19-9/24, zosyn 9/24-9/27, unasyn 9/27-9/30, zosyn 9/30-present, vancomycin 10/2-10/3, linezolid 10/3, Eraxis 10/4 9/10-received 1 unit PRBC for hemoglobin 6.7, went back on Levophed for short period of time 9/12 - Extubated to Venturi mask 9/13 - Off Fentanyl and Propofol gtt 9/15 Transferred to [facility] 9/24 IV Zosyn 9/26 CT a/p showed perihepatic fluid, IR Drains placed 10/2 PCCM consulted for respiratory failure, intubated, PEA arrest s/p intubation, 2 minutes CPR, Transferred to icu 10/4 CT A/P with colonic communication between transverse colon into mesocolon and necrotic collection, significant retroperitoneal collections 10/5 IR for exchange of drains. SBT x 1 hour the increased RR. 10/6 : Levophed discontinued, TNA started 10/7 : Tracheal Aspirate + for stenotrophomonas Maltophilia>> ID suspect this is a colonization not treating, but will continue to monitor 10/8: CT A/P overall unchanged with nec pancreatitis, collections unchanged, bilateral pleural effusions, anasarca. Started on Dilaudid gtt for pain control.  Interim History / Subjective:  10/10: hgb 6.6, transfusing 1uprbc, wbc 10.9 from 6.9  Objective   Blood pressure 127/66, pulse (!) 111, temperature (!) 100.9 F (38.3 C), temperature source Bladder, resp. rate (!) 22, height 5\' 9"  (1.753 m), weight 100.9 kg, SpO2 95 %.    Vent Mode: PRVC FiO2 (%):  [40 %-99 %] 40 % Set Rate:  [22 bmp] 22 bmp Vt Set:  [420 mL] 420 mL PEEP:  [5 cmH20] 5 cmH20 Plateau Pressure:  [12 cmH20-17 cmH20] 17 cmH20   Intake/Output Summary (Last 24 hours) at 11/22/2020 1024  Last data filed at 11/22/2020 0800 Gross per 24 hour  Intake 3093.33 ml  Output 3190 ml  Net -96.67 ml   Filed Weights    11/21/20 0015 11/21/20 0228 11/22/20 0413  Weight: 99.4 kg 99.4 kg 100.9 kg   Physical Exam: General: Chronically ill-appearing, no acute distress, drowsy HENT: [place], AT, ETT in place Eyes: EOMI, no scleral icterus Respiratory: Clear to auscultation bilaterally.  No crackles, wheezing or rales Cardiovascular: RRR, -M/R/G, no JVD GI: absent bowel sounds, ttp, protuberant with increased distention, multiple JP drains in place with fluid, midline incision with gauze in place Extremities: 2+ pitting edema in lower extremities,-tenderness Neuro: Awake and alert, CNII-XII grossly intact, follows commands  Micro Review, Tracheal Aspirate + for Stenotrophomonas Maltophilia resistent to levo and tmp-smx  Resolved Hospital Problem list   Transaminitis AKI Acute metabolic encephalopathy  Assessment & Plan:   Acute hypoxemic respiratory failure secondary to aspiration pneumonitis/pneumonia, effusions, atelectasis +Steno Maltophilia in respiratory culture however thought to be colonization --LTVV. Wean FIO2/PEEP for goal 88-95% --copious thick secretions -cxr stat --Antibiotics per ID.Marland Kitchen will touch base about the steno. With worsening wbc, fever and secretions unsure is this should be considered colonization esp s/p therapy and now still abundant and resistent.  --Continue diuresis gingerly --Daily SBT/WUA --Guaifenesin and chest PT   Septic shock - resolved.  Secondary necrotizing pancreatitis, intra-abdominal abscess, aspiration pneumonia  Colonic communication w/ necrotic collection --JP drain culture: rare enterococcus faecalis, clostridium --Continue linezolid and Zosyn and Eraxis --Post-op care and TPN nutrition per general surgery: Surgery expects prolonged hospitalization that is standard for his condition. Will need definitive surgery once abscesses are adequately drained, which they are not --IR following for drain management --surgery ok with meds enterically  Acute blood loss  anemia  Chronic anemia Plan -Transfuse PRBC if HBG less than 7 -Monitor for signs of bleeding -transfusing today.   DM2 -Patient previously on novolog 8u q6h, semeglee 20u qd, +SSI.  Plan -Trend glucose  -SSI   Diarrhea TNA initiated 10/6 Plan - continue FMS  - Monitor output - Trend Lytes  Likely alcoholic cirrhosis  HX ETOH abuse -INR elevated, 2 on admission,  RUQ [place] 10/2 with nodular contour and ascites.  Plan -will resume spironolactone -Thiamine, folate   Severe protein calorie malnutrition Plan -contTPN  Thrombocytopenia:  -follow trend  Ileus:  -kub pending.  -may warrant removing cortrak and placing og for decompression  In hospital PEA arrest On 10/2. Suspect secondary to hypoxia/hypotension. Occurred shortly after intubation. 2 min cpr. 1 epi given RSI drugs limiting post ROSC neuro exam.  Best Practice (right click and "Reselect all SmartList Selections" daily)   Diet/type: NPO  and TNA DVT prophylaxis: prophylactic heparin  GI prophylaxis: PPI Lines: yes and it is still needed Foley:  Yes, and it is still needed Code Status:  Partial Code Last date of multidisciplinary goals of care discussion:  10/6>> See Note by Dr. Francine Graven Updated wife at bedside on 10/9  Labs   Labs reviewed. Anemia.  CBC: Recent Labs  Lab 11/17/20 0445 11/18/20 0549 11/19/20 0354 11/20/20 0310 11/22/20 0500  WBC 17.8* 11.7* 7.9 6.9 10.9*  NEUTROABS  --   --   --  5.1  --   HGB 8.3* 7.5* 7.4* 7.1* 6.6*  HCT 27.1* 24.5* 24.1* 23.8* 23.1*  MCV 101.5* 100.0 100.4* 101.7* 104.5*  PLT 231 200 176 161 131*    Basic Metabolic Panel: Recent Labs  Lab 11/18/20 0549 11/19/20 0354 11/20/20 0310 11/20/20 1117  11/21/20 0650 11/22/20 0500  NA 141 137 140 139 140 140  K 3.8 3.7 3.5 3.9 3.8 4.1  CL 106 102 104 105 105 107  CO2 27 27 28 28 29 28   GLUCOSE 132* 207* 253* 276* 187* 155*  BUN 15 14 12 12 13 14   CREATININE 0.66 0.57* 0.55* 0.59* 0.48* 0.54*  CALCIUM  7.9* 8.1* 8.4* 8.3* 8.5* 8.8*  MG 2.0 2.0 1.7  --  1.8 2.0  PHOS 4.3 3.3 3.1  --  3.5 3.0   GFR: Estimated Creatinine Clearance: 126.5 mL/min (A) (by C-G formula based on SCr of 0.54 mg/dL (L)). Recent Labs  Lab 11/18/20 0549 11/19/20 0354 11/20/20 0310 11/22/20 0500  WBC 11.7* 7.9 6.9 10.9*    Liver Function Tests: Recent Labs  Lab 11/18/20 0549 11/20/20 0310  AST 20 16  ALT 19 15  ALKPHOS 82 70  BILITOT 0.5 0.6  PROT 5.6* 5.5*  ALBUMIN <1.5* <1.5*   Coagulation Profile: Recent Labs  Lab 11/20/20 0310  INR 1.3*   CBG: Recent Labs  Lab 11/21/20 1524 11/21/20 1913 11/22/20 0000 11/22/20 0414 11/22/20 0720  GLUCAP 170* 153* 157* 138* 145*   Critical care time: The patient is critically ill with multiple organ systems failure and requires high complexity decision making for assessment and support, frequent evaluation and titration of therapies, application of advanced monitoring technologies and extensive interpretation of multiple databases.  Critical care time 38 mins. This represents my time independent of the NPs time taking care of the pt. This is excluding procedures.    01/22/21 DO [facility] 11/22/2020, 10:24 AM See Amion for pager If no response to pager, please call [phone] until 1900 After 1900 please call [facility] [phone]

## 2020-11-22 NOTE — Progress Notes (Signed)
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  Assessment:  [name] presented on 9/8 with abdominal pain and AMS found to have necrotizing pancreatitis s/p ex-lap on 9/9 and debridement.  Patient was on TPN and then transitioned to TF.  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  He is s/p surgical drain upsized.  Pharmacy consulted to resume TPN since TF is on hold on 10/4.  Glucose / Insulin: no hx DM, A1c 6.8% - CBGs improving Required 23 units SSI in last 24 hr, 35 units in TPN Was requiring ~80 units insulin/day while on goal TF or TPN Electrolytes: all WNL (CoCa high normal at 10.5) Renal: SCr < 1, BUN WNL Hepatic: LFTs / tbili / TG WNL, albumin < 1.5 Intake / Output; MIVF: UOP 1.2 ml/kg/hr with Lasix, drain 12/4, stool , net +1.8L GI Imaging: 9/8 CT abd and pelvis - evidence of necrotic pancreatitis  9/8 CT - extensive pneumoperitoneum 9/16 CT - large amount of residual gas and fluid dissecting through retroperitoneum, no evidence of enteric fistula or leak  9/19 abd XR - feeding catheter in distal duodenum/proximal jejunum 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 GI Surgeries / Procedures:  9/9 Ex lap and pancreatic debridement  9/26 IR placed 5 surgical abdominal drains, 2 removed by patient later 10/5 IR surgical drains upsized  Central access: PICC replaced 11/18/20 TPN start date: 9/10 >> 9/24, restarted 11/18/20  Nutritional Goals, RD Estimated Needs Total Energy Estimated Needs: 2350-2550 Total Protein Estimated Needs: 125-145 grams Total Fluid Estimated Needs: >/= 2.0 L  Current Nutrition:  TPN  Plan:  Continue concentrated TPN at goal rate 80 ml/hr to provide 135g AA, 334g CHO and 72g ILE for a total of 2395  kCal, meeting 100% of patient's needs Electrolytes in TPN: Na 63mEq/L, K 23mEq/L, Ca to 2.11mEq/L, Mg 52mEq/L, Phos 24mmol/L, Cl:Ac 1:1 (no changes to electrolytes 10/10) Continue standard MVI and trace elements to TPN.  Add thiamine/folate for hx EtOH use. Continue rSSI Q4H and increase regular insulin from 35>>40 units in TPN Standard TPN labs on Mon/Thurs  *unable to extubate due to significant abdominal pain*  Thank you for allowing pharmacy to be a part of this patient's care.  12m, PharmD Clinical Pharmacist  Please check AMION for all [facility] Pharmacy numbers After 10:00 PM, call Main Pharmacy [phone]

## 2020-11-22 NOTE — Progress Notes (Signed)
Progress Note  32 Days Post-Op  Subjective: Agitated on the vent. Per RN patient had a BM this AM, no emesis. His wife is at bedside this AM.   Objective: Vital signs in last 24 hours: Temp:  [97.9 F (36.6 C)-101.1 F (38.4 C)] 100.9 F (38.3 C) (10/10 0726) Pulse Rate:  [74-130] 111 (10/10 0800) Resp:  [12-24] 22 (10/10 0800) BP: (91-178)/(51-86) 127/66 (10/10 0800) SpO2:  [90 %-99 %] 95 % (10/10 0800) FiO2 (%):  [40 %-99 %] 40 % (10/10 0800) Weight:  [100.9 kg] 100.9 kg (10/10 0413) Last BM Date: 11/22/20  Intake/Output from previous day: 10/09 0701 - 10/10 0700 In: 3392.9 [I.V.:2476.5; NG/GT:105; IV Piggyback:506.3] Out: 3365 [Urine:2875; Drains:355] Intake/Output this shift: Total I/O In: 107.5 [I.V.:102.3; IV Piggyback:5.1] Out: -   PE: Gen:  agitated on the vent Card:  RRR Pulm:  CTAB, mechanically ventilated Abd: IR drain x2 on the right, upper drain with dingy appearing fluid, lower drain with purulent fluid;  perc drain on left with milky purulent fluid; drainage pouch over previous surgical drain sites with feculent looking drainage; midline wound clean with granulation tissue present   Lab Results:  Recent Labs    11/20/20 0310 11/22/20 0500  WBC 6.9 10.9*  HGB 7.1* 6.6*  HCT 23.8* 23.1*  PLT 161 131*   BMET Recent Labs    11/21/20 0650 11/22/20 0500  NA 140 140  K 3.8 4.1  CL 105 107  CO2 29 28  GLUCOSE 187* 155*  BUN 13 14  CREATININE 0.48* 0.54*  CALCIUM 8.5* 8.8*   PT/INR Recent Labs    11/20/20 0310  LABPROT 15.8*  INR 1.3*   CMP     Component Value Date/Time   NA 140 11/22/2020 0500   K 4.1 11/22/2020 0500   CL 107 11/22/2020 0500   CO2 28 11/22/2020 0500   GLUCOSE 155 (H) 11/22/2020 0500   BUN 14 11/22/2020 0500   CREATININE 0.54 (L) 11/22/2020 0500   CALCIUM 8.8 (L) 11/22/2020 0500   PROT 5.5 (L) 11/20/2020 0310   ALBUMIN <1.5 (L) 11/20/2020 0310   AST 16 11/20/2020 0310   ALT 15 11/20/2020 0310   ALKPHOS 70  11/20/2020 0310   BILITOT 0.6 11/20/2020 0310   GFRNONAA >60 11/22/2020 0500   Lipase     Component Value Date/Time   LIPASE 29 11/13/2020 0054       Studies/Results: CT ABDOMEN W CONTRAST  Result Date: 11/20/2020 CLINICAL DATA:  Acute pancreatitis. Acute abdominal pain. Left-sided drain. EXAM: CT ABDOMEN WITH CONTRAST TECHNIQUE: Multidetector CT imaging of the abdomen was performed using the standard protocol following bolus administration of intravenous contrast. CONTRAST:  OMNIPAQUE IOHEXOL 350 MG/ML SOLN COMPARISON:  11/16/2020 CT abdomen/pelvis. FINDINGS: Lower chest: Small to moderate dependent right pleural effusion and moderate dependent left pleural effusion, increased. Complete bilateral lower lobe atelectasis and moderate lingular atelectasis, worsened. Tip of superior approach central venous catheter seen at the cavoatrial junction. Hepatobiliary: Normal liver size. Two scattered subcentimeter hypodense posterior right liver lesions, too small to characterize, not definitely changed. No new liver lesions. No radiopaque cholelithiasis. No definite gallbladder wall thickening. No biliary ductal dilatation. CBD diameter 6 mm, top-normal, unchanged. Pancreas: There is replacement of much of the distal pancreatic body and pancreatic tail with a gas containing thick walled fluid collection extending into the anterior paranephric left retroperitoneal space and into the transverse mesocolon, with percutaneous drain terminating within the left anterior paranephric portion of this collection, which  measures up to 21.8 cm transverse x 5.3 cm AP (series 3/images 37-41), previously 22.6 x 5.3 cm, not appreciably changed. Similar gas containing thick walled peripancreatic collection in the pancreatic head extending into the anterior right paranephric retroperitoneal space with right sided percutaneous drain terminating within this right retroperitoneal collection, which measures 7.6 x 2.9 cm  (series 3/image 52), previously 7.6 x 3.0 cm, not appreciably changed. Low left retroperitoneal 5.9 x 4.5 cm collection with internal gas and thick enhancing wall intimately involved with the left iliopsoas muscle (series 3/image 61), incompletely visualized on this scan, previously 5.6 x 5.0 cm using similar measurement technique, not substantially changed. Spleen: Normal size. No mass. Adrenals/Urinary Tract: Normal adrenals. Normal kidneys with no hydronephrosis and no renal mass. Stomach/Bowel: Gastrojejunostomy tube terminates within the proximal jejunum in the left abdomen. Stomach is collapsed with mild reactive gastric body wall thickening, unchanged. Generalized mild dilatation of the visualized small and large bowel with scattered small bowel and large bowel fluid levels, compatible with adynamic ileus. No definite small or large bowel wall thickening. Vascular/Lymphatic: Atherosclerotic nonaneurysmal abdominal aorta. Patent portal, splenic, hepatic and renal veins. No pathologically enlarged lymph nodes in the abdomen. Other: Peripancreatic fluid collection with thick enhancing wall measuring up to 3.3 cm thickness (series 3/image 45), previously 3.4 cm, not substantially changed, with percutaneous pigtail drain terminating in the superior right perihepatic space, with a small amount of gas in the anterior perihepatic space. Mild to moderate anasarca. Musculoskeletal: No aggressive appearing focal osseous lesions. Mild thoracolumbar spondylosis. IMPRESSION: 1. CT abdomen findings are not substantially changed since 11/16/2020, with stable sequela of necrotizing pancreatitis including replacement of much of the distal pancreatic body and pancreatic tail with large gas-containing thick walled fluid collection extending into the anterior paranephric left retroperitoneal space and into the transverse mesocolon. Additional peripancreatic head and anterior right paranephric retroperitoneal gas containing fluid  collection, gas containing perihepatic fluid collection and low left retroperitoneal gas containing fluid collection intimately involving the left iliopsoas muscle are not substantially changed. Bilateral percutaneous pigtail drainage catheters remain well positioned within these collections. 2. Generalized mild dilatation of the visualized small and large bowel with scattered small bowel and large bowel fluid levels, compatible with adynamic ileus. 3. Small to moderate dependent right pleural effusion and moderate dependent left pleural effusion, increased. 4. Mild to moderate anasarca. 5. Aortic Atherosclerosis (ICD10-I70.0). Electronically Signed   By: Delbert Phenix M.D.   On: 11/20/2020 13:04    Anti-infectives: Anti-infectives (From admission, onward)    Start     Dose/Rate Route Frequency Ordered Stop   11/17/20 1300  anidulafungin (ERAXIS) 100 mg in sodium chloride 0.9 % 100 mL IVPB        100 mg 78 mL/hr over 100 Minutes Intravenous Every 24 hours 11/16/20 1141     11/16/20 1300  anidulafungin (ERAXIS) 200 mg in sodium chloride 0.9 % 200 mL IVPB        200 mg 78 mL/hr over 200 Minutes Intravenous  Once 11/16/20 1141 11/16/20 1700   11/15/20 1015  linezolid (ZYVOX) IVPB 600 mg  Status:  Discontinued        600 mg 300 mL/hr over 60 Minutes Intravenous Every 12 hours 11/15/20 0919 11/19/20 1114   11/14/20 1200  vancomycin (VANCOREADY) IVPB 750 mg/150 mL  Status:  Discontinued        750 mg 150 mL/hr over 60 Minutes Intravenous Every 8 hours 11/14/20 0251 11/15/20 0919   11/14/20 0330  vancomycin (VANCOREADY) IVPB 2000  mg/400 mL        2,000 mg 200 mL/hr over 120 Minutes Intravenous  Once 11/14/20 0251 11/14/20 0519   11/12/20 1900  piperacillin-tazobactam (ZOSYN) IVPB 3.375 g        3.375 g 12.5 mL/hr over 240 Minutes Intravenous Every 8 hours 11/12/20 1538     11/09/20 1730  Ampicillin-Sulbactam (UNASYN) 3 g in sodium chloride 0.9 % 100 mL IVPB  Status:  Discontinued        3 g 200  mL/hr over 30 Minutes Intravenous Every 6 hours 11/09/20 1617 11/12/20 1538   11/06/20 1545  piperacillin-tazobactam (ZOSYN) IVPB 3.375 g  Status:  Discontinued        3.375 g 12.5 mL/hr over 240 Minutes Intravenous Every 8 hours 11/06/20 1453 11/09/20 1616   11/01/20 0900  levofloxacin (LEVAQUIN) IVPB 750 mg  Status:  Discontinued        750 mg 100 mL/hr over 90 Minutes Intravenous Every 24 hours 11/01/20 0824 11/06/20 1408   11/01/20 0900  metroNIDAZOLE (FLAGYL) IVPB 500 mg  Status:  Discontinued        500 mg 100 mL/hr over 60 Minutes Intravenous Every 12 hours 11/01/20 0824 11/06/20 1408   10/29/20 2200  meropenem (MERREM) 1 g in sodium chloride 0.9 % 100 mL IVPB  Status:  Discontinued        1 g 200 mL/hr over 30 Minutes Intravenous Every 8 hours 10/29/20 1428 11/01/20 0824   10/28/20 1615  meropenem (MERREM) 2 g in sodium chloride 0.9 % 100 mL IVPB  Status:  Discontinued        2 g 200 mL/hr over 30 Minutes Intravenous Every 8 hours 10/28/20 1515 10/29/20 1428   10/28/20 1445  metroNIDAZOLE (FLAGYL) IVPB 500 mg  Status:  Discontinued        500 mg 100 mL/hr over 60 Minutes Intravenous Every 12 hours 10/28/20 1348 10/28/20 1505   10/25/20 2300  fluconazole (DIFLUCAN) IVPB 400 mg        400 mg 100 mL/hr over 120 Minutes Intravenous Every 24 hours 10/25/20 0742 10/29/20 0718   10/24/20 2300  fluconazole (DIFLUCAN) IVPB 200 mg  Status:  Discontinued        200 mg 100 mL/hr over 60 Minutes Intravenous Every 24 hours 10/24/20 0943 10/25/20 0742   10/22/20 1800  vancomycin (VANCOREADY) IVPB 1500 mg/300 mL  Status:  Discontinued        1,500 mg 150 mL/hr over 120 Minutes Intravenous Every 24 hours 11/08/2020 2225 10/22/20 0759   10/22/20 0900  linezolid (ZYVOX) IVPB 600 mg  Status:  Discontinued        600 mg 300 mL/hr over 60 Minutes Intravenous Every 12 hours 10/22/20 0800 10/27/20 0852   10/22/20 0300  piperacillin-tazobactam (ZOSYN) IVPB 3.375 g  Status:  Discontinued        3.375  g 12.5 mL/hr over 240 Minutes Intravenous Every 8 hours 10/28/2020 2225 10/28/20 1514   10/22/2020 2335  metroNIDAZOLE (FLAGYL) IVPB 500 mg  Status:  Discontinued        500 mg 100 mL/hr over 60 Minutes Intravenous Every 12 hours 10/30/2020 2142 10/22/20 0747   11/10/2020 2248  fluconazole (DIFLUCAN) IVPB 400 mg  Status:  Discontinued        400 mg 100 mL/hr over 120 Minutes Intravenous Every 24 hours 10/30/2020 2142 10/24/20 0943   10/24/2020 1748  vancomycin (VANCOREADY) IVPB 2000 mg/400 mL        2,000 mg 200  mL/hr over 120 Minutes Intravenous  Once Nov 08, 2020 1706 2020-11-08 2113   Nov 08, 2020 1715  piperacillin-tazobactam (ZOSYN) IVPB 3.375 g        3.375 g 100 mL/hr over 30 Minutes Intravenous  Once 11/08/20 1706 Nov 08, 2020 1903        Assessment/Plan POD 31 s/p ex lap with pancreatic debridement for infected necrotic pancreatitis by Dr. Sheliah Hatch on 9/9 - CT 9/24 w/ gas and fluid collections that are grossly stable in the retroperitoneum and are drained by surgical drains. - CT 9/24 also with mildly increased amount of fluid is noted around the liver with an increased amount of air seen in the non dependent portion concerning for persistent bowel leak.  - s/p IR drains 9/26. Cx with enterococcus faecalis, abx per ID - BID WTD - Patient pulled out surgical drain x2 on 10/1, he still has IR drain x3 in place. These appear to be in good position and are all putting out purulent drainage - CT 10/1 air-fluid collections throughout the abdomen and pelvis have not significantly changed in size or distribution; New wall thickening of the splenic flexure of the colon worrisome for reactive colitis - Previous drain sites leaking -  agree with drainage pouch, change every 3 days or PRN if leaking  - CT 10/4 with colonic communication between transverse colon into mesocolon and necrotic collection, significant retroperitoneal collections - Drains upsized on 10/5 - CT 10/8 with stable drains and mild ileus -  check KUB today but may need NGT/OGT decompression - will discuss possible retroperitoneal debridement with Dr. 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  He seems to be slowly improving clinically after upsize of his percutaneous drains 2 days ago.  The right upper quadrant drain has had a lot of output of purulent drainage, however the other 2 drains appear to be mostly flush.  Continue flushing drains aggressively to promote as much drainage as possible.  Patient is ultimately going to require a colon resection with operative debridement to definitively treat his condition, however it would be very difficult and high risk to reopen his abdomen at this time.  Hopefully his drains will provide adequate source control until definitive operation can be performed.  Continue TPN to optimize nutrition, and if patient continues to improve we will consider resuming tube feeds.  Patient can be expected to have a prolonged hospitalization and has a long clinical course that of him as is typical of necrotizing pancreatitis, however this is a survivable condition and the patient seems to be making improvements. Surgery will continue to follow very closely.  EtOH abuse Tobacco abuse HTN Gout   LOS: 32 days    Juliet Rude, [facility] Surgery 11/22/2020, 10:23 AM Please see Amion for pager number during day hours 7:00am-4:30pm

## 2020-11-23 DIAGNOSIS — K631 Perforation of intestine (nontraumatic): Secondary | ICD-10-CM | POA: Diagnosis not present

## 2020-11-23 LAB — BASIC METABOLIC PANEL
Anion gap: 5 (ref 5–15)
BUN: 16 mg/dL (ref 6–20)
CO2: 31 mmol/L (ref 22–32)
Calcium: 9.4 mg/dL (ref 8.9–10.3)
Chloride: 102 mmol/L (ref 98–111)
Creatinine, Ser: 0.61 mg/dL (ref 0.61–1.24)
GFR, Estimated: >60 mL/min (ref >60)
Glucose, Bld: 174 mg/dL — ABNORMAL HIGH (ref 70–99)
Potassium: 3.8 mmol/L (ref 3.5–5.1)
Sodium: 138 mmol/L (ref 135–145)

## 2020-11-23 LAB — CBC
HCT: 28.2 % — ABNORMAL LOW (ref 39.0–52.0)
Hemoglobin: 8.3 g/dL — ABNORMAL LOW (ref 13.0–17.0)
MCH: 30.4 pg (ref 26.0–34.0)
MCHC: 29.4 g/dL — ABNORMAL LOW (ref 30.0–36.0)
MCV: 103.3 fL — ABNORMAL HIGH (ref 80.0–100.0)
Platelets: 126 10*3/uL — ABNORMAL LOW (ref 150–400)
RBC: 2.73 MIL/uL — ABNORMAL LOW (ref 4.22–5.81)
RDW: 19 % — ABNORMAL HIGH (ref 11.5–15.5)
WBC: 7.6 10*3/uL (ref 4.0–10.5)
nRBC: 0 % (ref 0.0–0.2)

## 2020-11-23 LAB — GLUCOSE, CAPILLARY
Glucose-Capillary: 164 mg/dL — ABNORMAL HIGH (ref 70–99)
Glucose-Capillary: 141 mg/dL — ABNORMAL HIGH (ref 70–99)
Glucose-Capillary: 167 mg/dL — ABNORMAL HIGH (ref 70–99)
Glucose-Capillary: 159 mg/dL — ABNORMAL HIGH (ref 70–99)
Glucose-Capillary: 160 mg/dL — ABNORMAL HIGH (ref 70–99)
Glucose-Capillary: 167 mg/dL — ABNORMAL HIGH (ref 70–99)

## 2020-11-23 LAB — MAGNESIUM: Magnesium: 1.8 mg/dL (ref 1.7–2.4)

## 2020-11-23 MED ORDER — CALCIUM FOR TPN
INJECTION | INTRAVENOUS | Status: AC
Start: 2020-11-23 — End: 2020-11-24
  Filled 2020-11-23: qty 899.87

## 2020-11-23 MED ORDER — ALBUMIN HUMAN 25 % IV SOLN
12.5000 g | Freq: Four times a day (QID) | INTRAVENOUS | Status: AC
  Administered 2020-11-23 – 2020-11-24 (×4): 12.5 g via INTRAVENOUS
  Filled 2020-11-23 (×4): qty 50

## 2020-11-23 MED ORDER — DEXMEDETOMIDINE HCL IN NACL 400 MCG/100ML IV SOLN
0.0000 ug/kg/h | INTRAVENOUS | Status: AC
  Administered 2020-11-23: 1.1 ug/kg/h via INTRAVENOUS
  Administered 2020-11-23: 1 ug/kg/h via INTRAVENOUS
  Administered 2020-11-23 – 2020-11-24 (×6): 1.1 ug/kg/h via INTRAVENOUS
  Administered 2020-11-24: 1.2 ug/kg/h via INTRAVENOUS
  Administered 2020-11-24 (×2): 1.1 ug/kg/h via INTRAVENOUS
  Administered 2020-11-25: 1.2 ug/kg/h via INTRAVENOUS
  Administered 2020-11-25: 1.6 ug/kg/h via INTRAVENOUS
  Administered 2020-11-25: 1.2 ug/kg/h via INTRAVENOUS
  Administered 2020-11-25: 1.5 ug/kg/h via INTRAVENOUS
  Administered 2020-11-25: 1.2 ug/kg/h via INTRAVENOUS
  Administered 2020-11-25: 1.5 ug/kg/h via INTRAVENOUS
  Administered 2020-11-25: 1.6 ug/kg/h via INTRAVENOUS
  Administered 2020-11-25: 1.5 ug/kg/h via INTRAVENOUS
  Administered 2020-11-26 (×4): 1.6 ug/kg/h via INTRAVENOUS
  Filled 2020-11-23 (×4): qty 100
  Filled 2020-11-23: qty 200
  Filled 2020-11-23 (×4): qty 100
  Filled 2020-11-23: qty 200
  Filled 2020-11-23 (×4): qty 100
  Filled 2020-11-23: qty 200
  Filled 2020-11-23 (×6): qty 100

## 2020-11-23 MED ORDER — FUROSEMIDE 10 MG/ML IJ SOLN
40.0000 mg | Freq: Four times a day (QID) | INTRAMUSCULAR | Status: AC
Start: 2020-11-23 — End: 2020-11-24
  Administered 2020-11-23 – 2020-11-24 (×4): 40 mg via INTRAVENOUS
  Filled 2020-11-23 (×4): qty 4

## 2020-11-23 MED ORDER — MAGNESIUM SULFATE 2 GM/50ML IV SOLN
2.0000 g | Freq: Once | INTRAVENOUS | Status: AC
  Administered 2020-11-23: 2 g via INTRAVENOUS
  Filled 2020-11-23: qty 50

## 2020-11-23 MED ORDER — POTASSIUM CHLORIDE 20 MEQ PO PACK
40.0000 meq | PACK | Freq: Once | ORAL | Status: AC
  Administered 2020-11-23: 40 meq
  Filled 2020-11-23: qty 2

## 2020-11-23 MED ORDER — TRACE MINERALS CU-MN-SE-ZN 300-55-60-3000 MCG/ML IV SOLN: INTRAVENOUS | Status: DC

## 2020-11-23 NOTE — Progress Notes (Signed)
Id brief note  Intermittent fever likely abd abscesses and severe necrotizing pancreatitis. Working up difficult to grow yeast [employer])  No evidence previously candida  Brief clinical improvement so far but overall unclear trajectory    -will stop anidulofungin -continue piptazo -await micro workup of malessezia furfur -duration of abx moving target depending on his clinical improvement -will follow peripherally

## 2020-11-23 NOTE — Progress Notes (Signed)
Pharmacy Electrolyte Replacement  Recent Labs:  Recent Labs    11/22/20 0500 11/23/20 1750  K 4.1 3.8  MG 2.0 1.8  PHOS 3.0  --   CREATININE 0.54* 0.61    Low Critical Values (K </= 2.5, Phos </= 1, Mg </= 1) Present:   MD Contacted:   Plan:  - K 3.8 - will supplement with 40 mEq PT x 1 since still with 2 additional doses of lasix 40 mg IV scheduled - Mg 1.8 - 2g IV x 1 - F/u AM labs  Thank you for allowing pharmacy to be a part of this patient's care.  Georgina Pillion, PharmD, BCPS Clinical Pharmacist Clinical phone for 11/23/2020: [phone] 11/23/2020 8:39 PM   **Pharmacist phone directory can now be found on [email or web address] ([email or web address]).  Listed under [facility].

## 2020-11-23 NOTE — Progress Notes (Signed)
33 Days Post-Op  Subjective: Afebrile overnight on cooling blanket. No pressor requirement.   Objective: Vital signs in last 24 hours: Temp:  [97.8 F (36.6 C)-99.4 F (37.4 C)] 99 F (37.2 C) (10/11 0800) Pulse Rate:  [65-103] 103 (10/11 0800) Resp:  [19-26] 24 (10/11 0800) BP: (88-130)/(55-78) 130/64 (10/11 0800) SpO2:  [92 %-99 %] 92 % (10/11 0800) FiO2 (%):  [40 %] 40 % (10/11 0800) Weight:  [93.4 kg] 93.4 kg (10/11 0500) Last BM Date: 11/23/20  Intake/Output from previous day: 10/10 0701 - 10/11 0700 In: 3742 [I.V.:2597; Blood:390; NG/GT:35; IV Piggyback:270.1] Out: 3055 [Urine:2530; Drains:525] Intake/Output this shift: Total I/O In: 117.4 [I.V.:104.9; IV Piggyback:12.4] Out: -   PE: General: intubated, on vent Resp: ETT in place CV: RRR Abdomen: distended, compressible, midline incision granulating. Prior drain sites on right lateral abdomen are pouched and draining stool. Percutaneous drains all with milky purulent output.   Lab Results:  Recent Labs    11/22/20 0500 11/22/20 1602 11/23/20 0429  WBC 10.9*  --  7.6  HGB 6.6* 7.6* 8.3*  HCT 23.1* 25.5* 28.2*  PLT 131*  --  126*   BMET Recent Labs    11/21/20 0650 11/22/20 0500  NA 140 140  K 3.8 4.1  CL 105 107  CO2 29 28  GLUCOSE 187* 155*  BUN 13 14  CREATININE 0.48* 0.54*  CALCIUM 8.5* 8.8*   PT/INR No results for input(s): LABPROT, INR in the last 72 hours. CMP     Component Value Date/Time   NA 140 11/22/2020 0500   K 4.1 11/22/2020 0500   CL 107 11/22/2020 0500   CO2 28 11/22/2020 0500   GLUCOSE 155 (H) 11/22/2020 0500   BUN 14 11/22/2020 0500   CREATININE 0.54 (L) 11/22/2020 0500   CALCIUM 8.8 (L) 11/22/2020 0500   PROT 5.5 (L) 11/20/2020 0310   ALBUMIN <1.5 (L) 11/20/2020 0310   AST 16 11/20/2020 0310   ALT 15 11/20/2020 0310   ALKPHOS 70 11/20/2020 0310   BILITOT 0.6 11/20/2020 0310   GFRNONAA >60 11/22/2020 0500   Lipase     Component Value Date/Time   LIPASE 29  11/13/2020 0054       Studies/Results: DG Abd 1 View  Result Date: 11/22/2020 CLINICAL DATA:  Orogastric tube placement. EXAM: ABDOMEN - 1 VIEW COMPARISON:  Film earlier today at 1036 hours FINDINGS: Stable positioning feeding tube which is partially visualized. A newly placed orogastric tube follows the exact course of the feeding tube with the tip located approximately at the level of the second portion of the duodenum. Visualized bowel gas is unremarkable. IMPRESSION: Orogastric tube follows the course of the feeding tube with the tip located in approximately the second portion of the duodenum. Electronically Signed   By: Irish Lack M.D.   On: 11/22/2020 16:44   DG CHEST PORT 1 VIEW  Result Date: 11/22/2020 CLINICAL DATA:  Pneumonia. EXAM: PORTABLE CHEST 1 VIEW COMPARISON:  11/20/2020 FINDINGS: An endotracheal tube remains in place terminating 3-3.5 cm above the carina. An enteric tube courses into the abdomen with tip not imaged. A right PICC terminates over the right atrium. Small to moderate bilateral pleural effusions, hazy right basilar airspace opacity, in dense left basilar opacity are similar to the prior study. No pneumothorax is identified. IMPRESSION: Unchanged small to moderate bilateral pleural effusions and left greater than right basilar consolidation or atelectasis. Electronically Signed   By: Sebastian Ache M.D.   On: 11/22/2020 11:11  DG Abd Portable 1V  Result Date: 11/22/2020 CLINICAL DATA:  Ileus. EXAM: PORTABLE ABDOMEN - 1 VIEW COMPARISON:  CT abdomen 11/20/2020 FINDINGS: A feeding tube terminates in the left mid abdomen at the level of the proximal jejunum. Bilateral abdominal drainage catheters are present. Mild small bowel dilatation is similar to the prior CT. No acute osseous abnormality is seen. IMPRESSION: Unchanged mild small bowel dilatation which may reflect ileus. Electronically Signed   By: Sebastian Ache M.D.   On: 11/22/2020 11:16      Assessment/Plan This is a 55 year old male with necrotizing pancreatitis with infected necrosis, who presented with pneumoperitoneum and is now 1 month status post exploratory laparotomy with pancreatic debridement and drain placement.  The original surgical drains have been dislodged and the patient now has 3 percutaneous drains in the retroperitoneum.  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  I reviewed his most recent CT scan from several days ago which is relatively unchanged from prior.  He still has a significant amount of ongoing gas and fluid in the retroperitoneum tracking down the left colic gutter.  Left-sided drain is in good position and is being flushed regularly but is likely not large enough to drain the solid material that is present.  Patient is also decompressing through his prior drain sites but this is not providing adequate drainage, which is likely why he has not made significant clinical improvement in the last few days.  Laparotomy would be high risk since the patient is a month out from his previous surgery, thus I think he would benefit from a retroperitoneal debridement in the operating room with placement of a much larger drain.  This will be approached via the left-sided drain tract.  We will plan to do this tomorrow.  Continue TPN and broad-spectrum antibiotics.    LOS: 33 days    Sophronia Simas, MD [facility] Surgery General, Hepatobiliary and Pancreatic Surgery 11/23/20 8:34 AM

## 2020-11-23 NOTE — Progress Notes (Signed)
NAME:  Paul Chambers, MRN:  [id number], DOB:  1965-10-19, LOS: 33 ADMISSION DATE:  November 10, 2020, CONSULTATION DATE:  11/14/2020 REFERRING MD:  Dr. Tereasa Coop, Reason for consult: Acute respiratory failure  History of Present Illness:  Patient was encephalopathic and/or intubated. Therefore history has been obtained from chart review.   Paul Chambers, is a 55 y.o. male, who was admitted to [facility] on 11-10-2020 with abdominal pain.  With a pertinent pmx of alcoholism, HTN, gout, pancreatitis, tobacco use disorder.  He was found to have pneumoperitoneum without clear perforation. He was started on vanc and zosyn. He underwent ex lap on 9/9 and was found to have a large amount of necrotic fluid surrounding his pancreas, which was concerning for necrotic infected pancreas, two drains were placed. No bowel perforation was noted.  His hospital course has been complicated by acute respiratory failure secondary to possible aspiration pneumonia, acute blood loss anemia requiring transfusion, delirium, severe malnutrition requiring TPN. Was weaned of TPN and TF was started. He continued to spike fevers and was tachycardic despite antibiotics. ID consulted on 9/24 and changed abx to IV zosyn. Repeat CT demonsdtated new perihepatic fluid. On 9/26 IR placed 3 new drains. Fluid cultures resulted with enterococcus and clostridium.  The early morning of 10/2 PCCM was called to bedside for O2 sats in the 70's with the patient not responding to noxious stimuli. The patient was emergently intubated. Shortly after the patient had a PEA arrest with 2 minutes of CPR until ROSC. 1 epi was given. The patient was transferred to ICU.  Pertinent  Medical History  alcoholism, HTN, gout, pancreatitis, tobacco abuse, cirrhosis   Significant Hospital Events: Including procedures, antibiotic start and stop dates in addition to other pertinent events   9/8 - Presented to [facility] with abdominal pain. CT A/P with extensive pneumoperitoneum 9/9 -  Ex-lap (no colonic perforation discovered, large necrotic fluid present surrounding pancreas. Transferred to ICU post-op, intubated and sedated.  9/9-9/15 Fluconazole, 9/9-9/15 linezolid, zosyn 9/9-9/15, meropenem 9/15-9/19, levofloxacin 9/19-9/24, flagyl 9/19-9/24, zosyn 9/24-9/27, unasyn 9/27-9/30, zosyn 9/30-present, vancomycin 10/2-10/3, linezolid 10/3, Eraxis 10/4 9/10-received 1 unit PRBC for hemoglobin 6.7, went back on Levophed for short period of time 9/12 - Extubated to Venturi mask 9/13 - Off Fentanyl and Propofol gtt 9/15 Transferred to [facility] 9/24 IV Zosyn 9/26 CT a/p showed perihepatic fluid, IR Drains placed 10/2 PCCM consulted for respiratory failure, intubated, PEA arrest s/p intubation, 2 minutes CPR, Transferred to icu 10/4 CT A/P with colonic communication between transverse colon into mesocolon and necrotic collection, significant retroperitoneal collections 10/5 IR for exchange of drains. SBT x 1 hour the increased RR. 10/6 : Levophed discontinued, TNA started 10/7 : Tracheal Aspirate + for stenotrophomonas Maltophilia>> ID suspect this is a colonization not treating, but will continue to monitor 10/8: CT A/P overall unchanged with nec pancreatitis, collections unchanged, bilateral pleural effusions, anasarca. Started on Dilaudid gtt for pain control.  Interim History / Subjective:  10/11: no acute events overnight. Dht changed to og for ileus. Anticipate OR time in am.  10/10: hgb 6.6, transfusing 1uprbc, wbc 10.9 from 6.9  Objective   Blood pressure 130/64, pulse (!) 103, temperature 99 F (37.2 C), temperature source Bladder, resp. rate (!) 24, height 5\' 9"  (1.753 m), weight 93.4 kg, SpO2 92 %.    Vent Mode: PRVC FiO2 (%):  [40 %] 40 % Set Rate:  [22 bmp] 22 bmp Vt Set:  [420 mL] 420 mL PEEP:  [5 cmH20-8 cmH20] 5 cmH20 Plateau Pressure:  [  15 cmH20-22 cmH20] 22 cmH20   Intake/Output Summary (Last 24 hours) at 11/23/2020 0844 Last data filed at 11/23/2020  0800 Gross per 24 hour  Intake 3751.93 ml  Output 2805 ml  Net 946.93 ml   Filed Weights   11/21/20 0228 11/22/20 0413 11/23/20 0500  Weight: 99.4 kg 100.9 kg 93.4 kg   Physical Exam: General: Chronically ill-appearing, sedated and unresponsive HENT: NCAT, ETT in place Eyes: PERRLA, no scleral icterus Respiratory: Clear to auscultation bilaterally.  No crackles, wheezing or rales Cardiovascular: RRR, -M/R/G, no JVD GI: absent bowel sounds, ttp, protuberant with increased distention, multiple JP drains in place with fluid, midline incision with gauze in place Extremities: 2+ pitting edema in lower extremities,-tenderness Neuro: sedated unresponsive on vent  Micro Review, Tracheal Aspirate + for Stenotrophomonas Maltophilia resistent to levo and tmp-smx  Resolved Hospital Problem list   Transaminitis AKI Acute metabolic encephalopathy  Assessment & Plan:   Acute hypoxemic respiratory failure secondary to aspiration pneumonitis/pneumonia, effusions, atelectasis +Steno Maltophilia in respiratory culture however thought to be colonization --LTVV. Wean FIO2/PEEP for goal 88-95% --copious thick secretions -cxr with some increased infiltrate --Antibiotics per ID. --repeat resp cx still with steno --Continue diuresis with albumin today --Daily SBT/WUA --Guaifenesin and chest PT   Septic shock - resolved.  Secondary necrotizing pancreatitis, intra-abdominal abscess, aspiration pneumonia  Colonic communication w/ necrotic collection --JP drain culture: rare enterococcus faecalis, clostridium --Continue linezolid and Zosyn ID stopping eraxis today.  -cont to follow for m furfur.  -plan for OR in am.  --Post-op care and TPN nutrition per general surgery: Surgery expects prolonged hospitalization that is standard for his condition.  --IR following for drain management --surgery ok with meds enterically  Acute blood loss anemia  Chronic anemia Plan -Transfuse PRBC if HBG less  than 7 -Monitor for signs of bleeding   DM2 -Patient previously on novolog 8u q6h, semeglee 20u qd, +SSI.  Plan -Trend glucose  -SSI   Diarrhea TNA initiated 10/6 Plan - continue FMS  - Monitor output - Trend Lytes  Likely alcoholic cirrhosis  HX ETOH abuse -INR elevated, 2 on admission,  RUQ [place] 10/2 with nodular contour and ascites.  Plan -will cont spironolactone -Thiamine, folate   Severe protein calorie malnutrition Plan -contTPN  Thrombocytopenia:  -follow trend  Ileus:  -kub pending.  -may warrant removing cortrak and placing og for decompression  In hospital PEA arrest On 10/2. Suspect secondary to hypoxia/hypotension. Occurred shortly after intubation. 2 min cpr. 1 epi given RSI drugs limiting post ROSC neuro exam.  Best Practice (right click and "Reselect all SmartList Selections" daily)   Diet/type: NPO  and TNA DVT prophylaxis: prophylactic heparin  GI prophylaxis: PPI Lines: yes and it is still needed Foley:  Yes, and it is still needed Code Status:  Partial Code Last date of multidisciplinary goals of care discussion:  10/6>> See Note by Dr. Francine Graven Updated wife at bedside on 10/10  Labs   Labs reviewed. Anemia.  CBC: Recent Labs  Lab 11/18/20 0549 11/19/20 0354 11/20/20 0310 11/22/20 0500 11/22/20 1602 11/23/20 0429  WBC 11.7* 7.9 6.9 10.9*  --  7.6  NEUTROABS  --   --  5.1  --   --   --   HGB 7.5* 7.4* 7.1* 6.6* 7.6* 8.3*  HCT 24.5* 24.1* 23.8* 23.1* 25.5* 28.2*  MCV 100.0 100.4* 101.7* 104.5*  --  103.3*  PLT 200 176 161 131*  --  126*    Basic Metabolic Panel: Recent Labs  Lab 11/18/20 0549 11/19/20 0354 11/20/20 0310 11/20/20 1117 11/21/20 0650 11/22/20 0500  NA 141 137 140 139 140 140  K 3.8 3.7 3.5 3.9 3.8 4.1  CL 106 102 104 105 105 107  CO2 27 27 28 28 29 28   GLUCOSE 132* 207* 253* 276* 187* 155*  BUN 15 14 12 12 13 14   CREATININE 0.66 0.57* 0.55* 0.59* 0.48* 0.54*  CALCIUM 7.9* 8.1* 8.4* 8.3* 8.5* 8.8*  MG  2.0 2.0 1.7  --  1.8 2.0  PHOS 4.3 3.3 3.1  --  3.5 3.0   GFR: Estimated Creatinine Clearance: 121.9 mL/min (A) (by C-G formula based on SCr of 0.54 mg/dL (L)). Recent Labs  Lab 11/19/20 0354 11/20/20 0310 11/22/20 0500 11/23/20 0429  WBC 7.9 6.9 10.9* 7.6    Liver Function Tests: Recent Labs  Lab 11/18/20 0549 11/20/20 0310  AST 20 16  ALT 19 15  ALKPHOS 82 70  BILITOT 0.5 0.6  PROT 5.6* 5.5*  ALBUMIN <1.5* <1.5*   Coagulation Profile: Recent Labs  Lab 11/20/20 0310  INR 1.3*   CBG: Recent Labs  Lab 11/22/20 1520 11/22/20 2007 11/22/20 2348 11/23/20 0415 11/23/20 0828  GLUCAP 181* 159* 150* 164* 141*   Critical care time: The patient is critically ill with multiple organ systems failure and requires high complexity decision making for assessment and support, frequent evaluation and titration of therapies, application of advanced monitoring technologies and extensive interpretation of multiple databases.  Critical care time 41 mins. This represents my time independent of the NPs time taking care of the pt. This is excluding procedures.    01/23/21 DO [facility] 11/23/2020, 8:44 AM See Amion for pager If no response to pager, please call [phone] until 1900 After 1900 please call [place] [phone]

## 2020-11-23 NOTE — TOC Progression Note (Signed)
Transition of Care [facility]) - Progression Note    Patient Details  Name: Paul Chambers MRN: [id number] Date of Birth: [birth year]  [facility] CM/SW Contact  Epifanio Lesches, RN Phone Number: 11/23/2020, 3:18 PM  Clinical Narrative:     -necrotizing pancreatitis with infected necrosis Pt to undergo surgery on tomorrow for retroperitoneal debridement. [facility] following... will initiate insurance authorization once medically ready. Pt discussed in LOS meeting with TOC leadership.  TOC team will continue to monitor and follow for TOC needs.  Expected Discharge Plan: Long Term Acute Care (LTAC) Barriers to Discharge: Continued Medical Work up  Expected Discharge Plan and Services Expected Discharge Plan: Long Term Acute Care (LTAC)   Discharge Planning Services: CM Consult   Living arrangements for the past 2 months: Single Family Home                                       Social Determinants of Health (SDOH) Interventions    Readmission Risk Interventions No flowsheet data found.

## 2020-11-23 NOTE — Consult Note (Addendum)
WOC Nurse Consult Note: Patient receiving care in [facility] 2M04 Reason for Consult: pt has a site on his mid abdomen that previously had 2 drains he removed. The site was leaking so much that someone decided to put a rectal pouch over it, so the skin between and around the puncture sites is very irritated. Wound type: Drain site wounds with leakage. The drain site openings are approximately 1.5 x 1.5 with thin yellow stool leakage from both sites. ?? Fistula Pressure Injury POA: NA Periwound: Red and irritated Dressing procedure/placement/frequency: Removed rectal pouch which was coming off anyway. Cleaned the site with water. Crusted the site with stoma powder and 45M skin barrier wipes. Applied barrier rings around the site (1 whole ring around the upper site and a half ring around the lower site. Cut 2 holes in the skin barrier and attached a 2 piece 2 3/4" ostomy pouch Hart [place] # [id number]) Skin Barrier [place] # 2) Barrier rings [place] # [phone]) 45M skin barrier wipes [place] # [phone]) Stoma Powder [place] #6).  Instruction for "Crusting" with Stoma Powder and Skin Barrier Wipes: After cleaning around the stoma WITH WATER ONLY, sprinkle Stoma Powder on the irritated skin. Spread the powder around with your finger.  The powder will adhere ONLY to wet, raw skin.  Brush any loose powder off. DAB the powder that stuck to the irritated skin with a skin barrier wipe.  Do NOT rub, only dab the powder to moisten it. Allow the powder to air dry.  You can repeat the process of spreading powder and dabbing up to three times, allowing each process to air dry. Place the prepared pouching system around the stoma.   This pouch may be changed out to a HOP pouch or small [place] pouch if needed. Bedside Paul Chambers to re-consult if this if needed.   Thank you for the consult. WOC nurse will not follow at this time.   Please re-consult the WOC team if needed.  Paul Reel Katrinka Blazing, MSN, Paul Chambers, Paul Chambers, Angus Seller, [facility] Pager [phone]

## 2020-11-23 NOTE — Progress Notes (Signed)
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  Assessment:  [name] presented on 9/8 with abdominal pain and AMS found to have necrotizing pancreatitis s/p ex-lap on 9/9 and debridement.  Patient was on TPN and then transitioned to TF.  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  He is s/p surgical drain upsized.  Pharmacy consulted to resume TPN since TF is on hold on 10/4.  Glucose / Insulin: no hx DM, A1c 6.8% - BG <180 Required 23 units SSI in last 24 hr, 40 units in TPN Electrolytes: all WNL (CoCa high normal at 10.5) Renal: SCr < 1, BUN WNL Hepatic: LFTs / tbili / TG WNL, albumin < 1.5 Intake / Output; MIVF: UOP 0.5 ml/kg/hr with Lasix, drain 12/4, stool , net +2.9L GI Imaging: 9/8 CT abd and pelvis - evidence of necrotic pancreatitis  9/8 CT - extensive pneumoperitoneum 9/16 CT - large amount of residual gas and fluid dissecting through retroperitoneum, no evidence of enteric fistula or leak  9/19 abd XR - feeding catheter in distal duodenum/proximal jejunum 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 10/10 CT abd - small bowel dilation w/ potential ileus  GI Surgeries / Procedures:  9/9 Ex lap and pancreatic debridement  9/26 IR placed 5 surgical abdominal drains, 2 removed by patient later 10/5 IR surgical drains upsized  Central access: PICC replaced 11/18/20 TPN start date: 9/10 >> 9/24, restarted 11/18/20  Nutritional Goals, RD Estimated Needs Total Energy Estimated Needs: 2350-2550 Total Protein Estimated Needs: 125-145 grams Total Fluid Estimated Needs: >/= 2.0 L  Current Nutrition:  TPN  Plan:  Continue concentrated TPN at goal rate 80 ml/hr to provide 135g AA, 334g CHO and 72g ILE for a total of 2395 kCal, meeting  100% of patient's needs Electrolytes in TPN: Na 23mEq/L, K 34mEq/L, Ca to 2.62mEq/L, Mg 53mEq/L, Phos 10mmol/L, Cl:Ac 1:1 (no changes to electrolytes 10/11) Continue standard MVI and trace elements to TPN.  Add thiamine/folate for hx EtOH use. Continue rSSI Q4H and increase regular insulin from 40 units in TPN Standard TPN labs on Mon/Thurs  F/u surgery plans for debridement and drain placement   Thank you for allowing pharmacy to participate in this patient's care.  12m, PharmD PGY1 Acute Care Resident  11/23/2020,10:27 AM

## 2020-11-23 NOTE — Progress Notes (Signed)
Nutrition Follow-up  DOCUMENTATION CODES:  Severe malnutrition in context of acute illness/injury  INTERVENTION:   Continue TPN dosing per Pharmacy to meet 100% of estimated needs.  NUTRITION DIAGNOSIS:  Severe Malnutrition related to acute illness (infected necrotic pancreatitis) as evidenced by moderate fat depletion, moderate muscle depletion, energy intake < or equal to 50% for > or equal to 5 days, percent weight loss (8.4% weight loss in 1.5 months). -- ongoing  GOAL:  Patient will meet greater than or equal to 90% of their needs -- met with TPN   MONITOR:  Diet advancement, Labs, Weight trends, TF tolerance, Skin, I & O's, Other (Comment) (TPN)  REASON FOR ASSESSMENT:  Consult Enteral/tube feeding initiation and management  ASSESSMENT:  55 year old male who presented to the ED on 9/08 with AMS and abdominal pain. Pt seen at [facility] 1 week PTA and was diagnosed with AKI and pancreatitis. PMH of HTN, gout, tobacco abuse, EtOH abuse. Pt admitted with sepsis secondary to infected necrotic pancreatitis.  S/P ex-lap with pancreatic debridement for infected necrotic pancreatitis 9/9.  TPN initiated 10/6 via triple lumen PICC d/t colon perforation. Currently infusing at 80 ml/h to provide 2396 kcal and 135 gm protein. Meets 100% of patients estimated calorie and protein needs.   Discussed patient in ICU rounds and with RN today. Abdominal wounds where drains were pulled are draining feculent material.  Patient remains intubated on ventilator support MV: 9.6 L/min Temp (24hrs), Avg:98.8 F (37.1 C), Min:97.8 F (36.6 C), Max:99.4 F (37.4 C)   Medications reviewed and include Colace, Lasix, Novolog, Protonix, spironolactone, Precedex, Dilaudid, IV abx.  Labs reviewed. CBGs: [phone]  Admission weight: 93.7 kg Current weight: 93.4 kg (10/11)  UOP: 2,530 ml x 24 hours Drain output: 525 ml x 24 hours I/O net +11.5 L since admit  Diet Order:   Diet Order              Diet NPO time specified Except for: Sips with Meds  Diet effective midnight                  EDUCATION NEEDS:  No education needs have been identified at this time  Skin:  Skin Assessment: Skin Integrity Issues: Skin Integrity Issues:: Other (Comment) DTI: coccyx Stage I: - Incisions: abdomen Other: 2 puncture wounds to abd from drain removal  Last BM:  10/11 type 7  Height:  Ht Readings from Last 1 Encounters:  11/14/20 5' 9" (1.753 m)   Weight:  Wt Readings from Last 1 Encounters:  11/23/20 93.4 kg   BMI:  Body mass index is 30.41 kg/m.  Estimated Nutritional Needs:  Kcal:  7793-9030 Protein:  125-145 grams Fluid:  >/= 2.0 L   Lucas Mallow, RD, LDN, CNSC Please refer to Amion for contact information.

## 2020-11-23 NOTE — Progress Notes (Signed)
PT Cancellation Note  Patient Details Name: Paul Chambers MRN: [id number] DOB: May 24, 1965   Cancelled Treatment:    Reason Eval/Treat Not Completed: Other (comment) (pt sedated and awaiting sx tomorrow with RN stating she is not lifting sedationt today so currently not appropriate)   Laya Letendre B Zurisadai Helminiak 11/23/2020, 11:14 AM [name], PT Acute Rehabilitation Services Pager: [phone] Office: [phone]

## 2020-11-24 ENCOUNTER — Inpatient Hospital Stay (HOSPITAL_COMMUNITY): Payer: BC Managed Care – PPO

## 2020-11-24 ENCOUNTER — Encounter (HOSPITAL_COMMUNITY): Admission: EM | Disposition: E | Payer: Self-pay | Source: Home / Self Care | Attending: Family Medicine

## 2020-11-24 DIAGNOSIS — K8591 Acute pancreatitis with uninfected necrosis, unspecified: Secondary | ICD-10-CM | POA: Diagnosis not present

## 2020-11-24 DIAGNOSIS — R6520 Severe sepsis without septic shock: Secondary | ICD-10-CM | POA: Diagnosis not present

## 2020-11-24 DIAGNOSIS — L0291 Cutaneous abscess, unspecified: Secondary | ICD-10-CM | POA: Diagnosis not present

## 2020-11-24 DIAGNOSIS — J9601 Acute respiratory failure with hypoxia: Secondary | ICD-10-CM | POA: Diagnosis not present

## 2020-11-24 DIAGNOSIS — A419 Sepsis, unspecified organism: Secondary | ICD-10-CM | POA: Diagnosis not present

## 2020-11-24 HISTORY — PX: RESECTION OF RETROPERITONEAL MASS: SHX6340

## 2020-11-24 LAB — BASIC METABOLIC PANEL
Anion gap: 9 (ref 5–15)
BUN: 18 mg/dL (ref 6–20)
CO2: 31 mmol/L (ref 22–32)
Calcium: 9.9 mg/dL (ref 8.9–10.3)
Chloride: 101 mmol/L (ref 98–111)
Creatinine, Ser: 0.65 mg/dL (ref 0.61–1.24)
GFR, Estimated: >60 mL/min (ref >60)
Glucose, Bld: 221 mg/dL — ABNORMAL HIGH (ref 70–99)
Potassium: 3.9 mmol/L (ref 3.5–5.1)
Sodium: 141 mmol/L (ref 135–145)

## 2020-11-24 LAB — MAGNESIUM: Magnesium: 1.9 mg/dL (ref 1.7–2.4)

## 2020-11-24 LAB — CULTURE, RESPIRATORY W GRAM STAIN: Gram Stain: NONE SEEN

## 2020-11-24 LAB — GLUCOSE, CAPILLARY
Glucose-Capillary: 179 mg/dL — ABNORMAL HIGH (ref 70–99)
Glucose-Capillary: 180 mg/dL — ABNORMAL HIGH (ref 70–99)
Glucose-Capillary: 189 mg/dL — ABNORMAL HIGH (ref 70–99)
Glucose-Capillary: 189 mg/dL — ABNORMAL HIGH (ref 70–99)
Glucose-Capillary: 205 mg/dL — ABNORMAL HIGH (ref 70–99)
Glucose-Capillary: 211 mg/dL — ABNORMAL HIGH (ref 70–99)

## 2020-11-24 LAB — CBC
HCT: 23.8 % — ABNORMAL LOW (ref 39.0–52.0)
Hemoglobin: 7.3 g/dL — ABNORMAL LOW (ref 13.0–17.0)
MCH: 31.3 pg (ref 26.0–34.0)
MCHC: 30.7 g/dL (ref 30.0–36.0)
MCV: 102.1 fL — ABNORMAL HIGH (ref 80.0–100.0)
Platelets: 142 10*3/uL — ABNORMAL LOW (ref 150–400)
RBC: 2.33 MIL/uL — ABNORMAL LOW (ref 4.22–5.81)
RDW: 19 % — ABNORMAL HIGH (ref 11.5–15.5)
WBC: 9.6 10*3/uL (ref 4.0–10.5)
nRBC: 0 % (ref 0.0–0.2)

## 2020-11-24 LAB — SURGICAL PCR SCREEN
MRSA, PCR: POSITIVE — AB
Staphylococcus aureus: POSITIVE — AB

## 2020-11-24 LAB — PHOSPHORUS: Phosphorus: 3.7 mg/dL (ref 2.5–4.6)

## 2020-11-24 LAB — PREPARE RBC (CROSSMATCH)

## 2020-11-24 SURGERY — EXCISION, MASS, RETROPERITONEUM
Anesthesia: General | Site: Flank | Laterality: Left

## 2020-11-24 MED ORDER — PHENYLEPHRINE 40 MCG/ML (10ML) SYRINGE FOR IV PUSH (FOR BLOOD PRESSURE SUPPORT)
PREFILLED_SYRINGE | INTRAVENOUS | Status: DC | PRN
  Administered 2020-11-24: 120 ug via INTRAVENOUS
  Administered 2020-11-24: 80 ug via INTRAVENOUS

## 2020-11-24 MED ORDER — MUPIROCIN 2 % EX OINT
1.0000 "application " | TOPICAL_OINTMENT | Freq: Two times a day (BID) | CUTANEOUS | Status: DC
  Administered 2020-11-24: 1 via NASAL
  Filled 2020-11-24: qty 22

## 2020-11-24 MED ORDER — FENTANYL CITRATE (PF) 250 MCG/5ML IJ SOLN
INTRAMUSCULAR | Status: AC
  Filled 2020-11-24: qty 5

## 2020-11-24 MED ORDER — ALBUMIN HUMAN 25 % IV SOLN
50.0000 g | Freq: Four times a day (QID) | INTRAVENOUS | Status: AC
  Administered 2020-11-24 (×2): 50 g via INTRAVENOUS
  Filled 2020-11-24 (×2): qty 200

## 2020-11-24 MED ORDER — SODIUM CHLORIDE 0.9% IV SOLUTION: Freq: Once | INTRAVENOUS | Status: DC

## 2020-11-24 MED ORDER — MAGNESIUM SULFATE 2 GM/50ML IV SOLN
2.0000 g | Freq: Once | INTRAVENOUS | Status: AC
  Administered 2020-11-24: 2 g via INTRAVENOUS
  Filled 2020-11-24: qty 50

## 2020-11-24 MED ORDER — LACTATED RINGERS IV SOLN: INTRAVENOUS | Status: DC | PRN

## 2020-11-24 MED ORDER — PHENYLEPHRINE HCL-NACL 20-0.9 MG/250ML-% IV SOLN
INTRAVENOUS | Status: DC | PRN
  Administered 2020-11-24: 60 ug/min via INTRAVENOUS

## 2020-11-24 MED ORDER — MIDAZOLAM HCL 2 MG/2ML IJ SOLN
INTRAMUSCULAR | Status: AC
  Filled 2020-11-24: qty 2

## 2020-11-24 MED ORDER — TRACE MINERALS CU-MN-SE-ZN 300-55-60-3000 MCG/ML IV SOLN
INTRAVENOUS | Status: AC
  Filled 2020-11-24: qty 899.87

## 2020-11-24 MED ORDER — MUPIROCIN 2 % EX OINT
1.0000 "application " | TOPICAL_OINTMENT | Freq: Two times a day (BID) | CUTANEOUS | Status: DC
  Administered 2020-11-24 – 2020-11-27 (×6): 1 via NASAL

## 2020-11-24 MED ORDER — PHENYLEPHRINE 40 MCG/ML (10ML) SYRINGE FOR IV PUSH (FOR BLOOD PRESSURE SUPPORT)
PREFILLED_SYRINGE | INTRAVENOUS | Status: AC
  Filled 2020-11-24: qty 10

## 2020-11-24 MED ORDER — MIDAZOLAM HCL 2 MG/2ML IJ SOLN
INTRAMUSCULAR | Status: DC | PRN
  Administered 2020-11-24: 2 mg via INTRAVENOUS

## 2020-11-24 MED ORDER — CHLORHEXIDINE GLUCONATE CLOTH 2 % EX PADS
6.0000 | MEDICATED_PAD | Freq: Every day | CUTANEOUS | Status: DC
  Administered 2020-11-24 – 2020-11-27 (×3): 6 via TOPICAL

## 2020-11-24 MED ORDER — ROCURONIUM BROMIDE 100 MG/10ML IV SOLN
INTRAVENOUS | Status: DC | PRN
  Administered 2020-11-24: 20 mg via INTRAVENOUS
  Administered 2020-11-24: 30 mg via INTRAVENOUS
  Administered 2020-11-24: 20 mg via INTRAVENOUS

## 2020-11-24 MED ORDER — ROCURONIUM BROMIDE 10 MG/ML (PF) SYRINGE
PREFILLED_SYRINGE | INTRAVENOUS | Status: AC
  Filled 2020-11-24: qty 10

## 2020-11-24 MED ORDER — 0.9 % SODIUM CHLORIDE (POUR BTL) OPTIME
TOPICAL | Status: DC | PRN
  Administered 2020-11-24: 2000 mL

## 2020-11-24 MED ORDER — SODIUM CHLORIDE FLUSH 0.9 % IV SOLN
100.0000 mL | Freq: Three times a day (TID) | INTRAVENOUS | Status: DC
  Administered 2020-11-24 – 2020-11-27 (×8): 100 mL
  Filled 2020-11-24 (×9): qty 100

## 2020-11-24 MED ORDER — PROPOFOL 10 MG/ML IV BOLUS
INTRAVENOUS | Status: AC
  Filled 2020-11-24: qty 20

## 2020-11-24 MED ORDER — BUPIVACAINE-EPINEPHRINE (PF) 0.25% -1:200000 IJ SOLN
INTRAMUSCULAR | Status: AC
  Filled 2020-11-24: qty 30

## 2020-11-24 SURGICAL SUPPLY — 76 items
ADH SKN CLS APL DERMABOND .7 (GAUZE/BANDAGES/DRESSINGS)
APL PRP STRL LF DISP 70% ISPRP (MISCELLANEOUS)
BAG COUNTER SPONGE SURGICOUNT (BAG) ×2 IMPLANT
BAG DRN RND TRDRP ANRFLXCHMBR (UROLOGICAL SUPPLIES)
BAG SPNG CNTER NS LX DISP (BAG) ×1
BAG URINE DRAIN 2000ML AR STRL (UROLOGICAL SUPPLIES) IMPLANT
BIOPATCH RED 1 DISK 7.0 (GAUZE/BANDAGES/DRESSINGS) IMPLANT
BLADE CLIPPER SURG (BLADE) IMPLANT
BOOT SUTURE AID YELLOW STND (SUTURE) ×2 IMPLANT
CANISTER SUCT 3000ML PPV (MISCELLANEOUS) ×2 IMPLANT
CATH THORACIC 36FR (CATHETERS) ×2 IMPLANT
CHLORAPREP W/TINT 26 (MISCELLANEOUS) IMPLANT
CLIP VESOCCLUDE LG 6/CT (CLIP) IMPLANT
CLIP VESOCCLUDE MED 24/CT (CLIP) IMPLANT
CLIP VESOCCLUDE SM WIDE 24/CT (CLIP) IMPLANT
CONNECTOR 5 IN 1 STRAIGHT STRL (MISCELLANEOUS) ×2 IMPLANT
COVER SURGICAL LIGHT HANDLE (MISCELLANEOUS) ×2 IMPLANT
DERMABOND ADVANCED (GAUZE/BANDAGES/DRESSINGS)
DERMABOND ADVANCED .7 DNX12 (GAUZE/BANDAGES/DRESSINGS) IMPLANT
DRAIN CHANNEL 19F RND (DRAIN) IMPLANT
DRAPE INCISE IOBAN 66X45 STRL (DRAPES) IMPLANT
DRAPE LAPAROSCOPIC ABDOMINAL (DRAPES) ×2 IMPLANT
DRAPE WARM FLUID 44X44 (DRAPES) IMPLANT
DRSG OPSITE POSTOP 4X10 (GAUZE/BANDAGES/DRESSINGS) IMPLANT
DRSG OPSITE POSTOP 4X8 (GAUZE/BANDAGES/DRESSINGS) IMPLANT
DRSG TEGADERM 4X4.75 (GAUZE/BANDAGES/DRESSINGS) IMPLANT
ELECT BLADE 6.5 EXT (BLADE) IMPLANT
ELECT CAUTERY BLADE 6.4 (BLADE) ×2 IMPLANT
ELECT PAD DSPR THERM+ ADLT (MISCELLANEOUS) IMPLANT
ELECT REM PT RETURN 9FT ADLT (ELECTROSURGICAL) ×2
ELECTRODE REM PT RTRN 9FT ADLT (ELECTROSURGICAL) ×1 IMPLANT
EVACUATOR SILICONE 100CC (DRAIN) IMPLANT
GLOVE SURG POLY MICRO LF SZ5.5 (GLOVE) ×2 IMPLANT
GLOVE SURG UNDER POLY LF SZ6 (GLOVE) ×2 IMPLANT
GOWN STRL REUS W/ TWL LRG LVL3 (GOWN DISPOSABLE) ×3 IMPLANT
GOWN STRL REUS W/TWL LRG LVL3 (GOWN DISPOSABLE) ×6
HAND PENCIL TRP OPTION (MISCELLANEOUS) ×2 IMPLANT
HANDLE SUCTION POOLE (INSTRUMENTS) IMPLANT
KIT BASIN OR (CUSTOM PROCEDURE TRAY) ×2 IMPLANT
KIT TURNOVER KIT B (KITS) ×2 IMPLANT
LIGASURE IMPACT 36 18CM CVD LR (INSTRUMENTS) IMPLANT
NS IRRIG 1000ML POUR BTL (IV SOLUTION) ×4 IMPLANT
PACK GENERAL/GYN (CUSTOM PROCEDURE TRAY) ×2 IMPLANT
PAD ARMBOARD 7.5X6 YLW CONV (MISCELLANEOUS) ×2 IMPLANT
PENCIL SMOKE EVACUATOR (MISCELLANEOUS) ×2 IMPLANT
RETRACTOR WND ALEXIS 25 LRG (MISCELLANEOUS) IMPLANT
RETRACTOR WOUND ALXS 34CM XLRG (MISCELLANEOUS) IMPLANT
RTRCTR WOUND ALEXIS 25CM LRG (MISCELLANEOUS)
RTRCTR WOUND ALEXIS 34CM XLRG (MISCELLANEOUS)
SHEARS FOC LG CVD HARMONIC 17C (MISCELLANEOUS) IMPLANT
SLEEVE SUCTION CATH 165 (SLEEVE) IMPLANT
SPECIMEN JAR LARGE (MISCELLANEOUS) IMPLANT
SPONGE DRAIN TRACH 4X4 STRL 2S (GAUZE/BANDAGES/DRESSINGS) ×2 IMPLANT
SPONGE T-LAP 18X18 ~~LOC~~+RFID (SPONGE) ×4 IMPLANT
STAPLER VISISTAT 35W (STAPLE) IMPLANT
SUCTION POOLE HANDLE (INSTRUMENTS)
SUT ETHILON 1 TP 1 60 (SUTURE) ×4 IMPLANT
SUT ETHILON 2 0 FS 18 (SUTURE) IMPLANT
SUT MNCRL AB 4-0 PS2 18 (SUTURE) IMPLANT
SUT PDS AB 1 TP1 96 (SUTURE) IMPLANT
SUT PROLENE 3 0 SH 48 (SUTURE) IMPLANT
SUT PROLENE 4 0 RB 1 (SUTURE) ×2
SUT PROLENE 4-0 RB1 .5 CRCL 36 (SUTURE) ×1 IMPLANT
SUT SILK 2 0 SH CR/8 (SUTURE) IMPLANT
SUT SILK 2 0 TIES 10X30 (SUTURE) IMPLANT
SUT SILK 3 0 SH CR/8 (SUTURE) IMPLANT
SUT SILK 3 0 TIES 10X30 (SUTURE) IMPLANT
SUT VIC AB 2-0 SH 18 (SUTURE) ×2 IMPLANT
SUT VIC AB 3-0 SH 18 (SUTURE) IMPLANT
SUT VIC AB 3-0 SH 27 (SUTURE)
SUT VIC AB 3-0 SH 27X BRD (SUTURE) IMPLANT
SYR TOOMEY 50ML (SYRINGE) ×2 IMPLANT
SYSTEM SAHARA CHEST DRAIN ATS (WOUND CARE) ×2 IMPLANT
TOWEL GREEN STERILE (TOWEL DISPOSABLE) ×2 IMPLANT
TRAY FOLEY MTR SLVR 16FR STAT (SET/KITS/TRAYS/PACK) IMPLANT
YANKAUER SUCT BULB TIP NO VENT (SUCTIONS) IMPLANT

## 2020-11-24 NOTE — Op Note (Signed)
Date: 11/26/2020  Patient: Paul Chambers MRN: [id number]  Preoperative Diagnosis: Necrotizing pancreatitis with infected necrosis Postoperative Diagnosis: Same  Procedure: Retroperitoneal debridement of pancreatic necrosis  Surgeon: Sophronia Simas, MD Assistant: Manus Rudd, MD  EBL: Minimal  Anesthesia: General endotracheal  Specimens: Pancreatic necrosis for culture  Indications: [name] is a 55 year old male who presented with necrotizing pancreatitis and free intraperitoneal air.  He underwent exploratory laparotomy with debridement and drain placement 1 month ago.  Postoperatively he has had prolonged ventilator dependence with persistent signs of sepsis.  His surgical drains became dislodged and he has undergone placement of percutaneous drains with recent upsize.  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  Thus the decision was made to proceed with a retroperitoneal debridement.  Findings: Solid necrosis and debris evacuated from the left paracolic gutter and retroperitoneum.  The existing pigtail drainage catheter was removed and replaced with a 36-Fr thoracostomy tube.  Procedure details: Informed consent was obtained from the patient's next-of-kin prior to the procedure. The patient was brought to the operating room and general anesthesia was induced.  He was placed in the right lateral decubitus position. A pre-procedure timeout was taken verifying patient identity, surgical site and procedure to be performed.  The left flank was prepped and draped in the usual sterile fashion.  A transverse incision was made on the left flank around the existing percutaneous drain.  Cautery was then used to divide the subcutaneous tissue, muscle fascia, and muscle, following the tract of the drain.  The retroperitoneum was then entered  along the drain tract, and copious purulent and feculent fluid was suctioned out.  Culture swabs of this fluid were taken.  Ring forceps was then used to debride solid material from the abscess cavity.  A large amount of solid debris, consistent with pancreatic necrosis, was evacuated from the cavity.  A sample of this material was sent for culture.  The cavity was then copiously irrigated with several liters of warm saline.  There was no evidence of active bleeding.  Cavity was digitally probed and tracked superiorly.  A 36 French thoracostomy tube was brought onto the field and inserted through the wound into the necrosis cavity.  The muscle fascia was then closed in layers around the drain using interrupted 2-0 Vicryl sutures.  The drain was secured to the skin using #1 nylon.  The skin was then very loosely closed with interrupted 1 nylon suture.  The drain was irrigated and applied to low continuous suction at .  The patient tolerated the procedure with no apparent complications.  All counts were correct x2 at the end of the procedure. The patient remained intubated and was transported back to the ICU for further care.  Sophronia Simas, MD 11/20/2020 2:28 PM

## 2020-11-24 NOTE — Progress Notes (Signed)
34 Days Post-Op  Subjective: No acute changes. RUQ drained 125, minimal output from other drains.  Objective: Vital signs in last 24 hours: Temp:  [97.4 F (36.3 C)-99 F (37.2 C)] 98.1 F (36.7 C) (10/12 0700) Pulse Rate:  [62-100] 82 (10/12 1000) Resp:  [18-26] 20 (10/12 1000) BP: (87-140)/(48-95) 115/59 (10/12 1000) SpO2:  [70 %-99 %] 94 % (10/12 1000) FiO2 (%):  [40 %-60 %] 60 % (10/12 0946) Last BM Date: 11/23/20  Intake/Output from previous day: 10/11 0701 - 10/12 0700 In: 3709.7 [I.V.:2734.2; NG/GT:175; IV Piggyback:350.5] Out: 4190 [Urine:4000; Drains:190] Intake/Output this shift: Total I/O In: 373.7 [I.V.:357; IV Piggyback:16.7] Out: 895 [Urine:425; Emesis/NG output:450; Drains:20]  PE: General: intubated, on vent Resp: ETT in place CV: RRR Abdomen: distended, compressible, midline incision granulating. Prior drain sites on right lateral abdomen are pouched and draining stool. RUQ drain with milky purulent output. Remaining drains x2 with scant seropurulent output.   Lab Results:  Recent Labs    11/23/20 0429 15-Dec-2020 0538  WBC 7.6 9.6  HGB 8.3* 7.3*  HCT 28.2* 23.8*  PLT 126* 142*   BMET Recent Labs    11/23/20 1750 Dec 15, 2020 0538  NA 138 141  K 3.8 3.9  CL 102 101  CO2 31 31  GLUCOSE 174* 221*  BUN 16 18  CREATININE 0.61 0.65  CALCIUM 9.4 9.9   PT/INR No results for input(s): LABPROT, INR in the last 72 hours. CMP     Component Value Date/Time   NA 141 December 15, 2020 0538   K 3.9 12/15/20 0538   CL 101 2020-12-15 0538   CO2 31 2020-12-15 0538   GLUCOSE 221 (H) Dec 15, 2020 0538   BUN 18 12-15-2020 0538   CREATININE 0.65 2020/12/15 0538   CALCIUM 9.9 Dec 15, 2020 0538   PROT 5.5 (L) 11/20/2020 0310   ALBUMIN <1.5 (L) 11/20/2020 0310   AST 16 11/20/2020 0310   ALT 15 11/20/2020 0310   ALKPHOS 70 11/20/2020 0310   BILITOT 0.6 11/20/2020 0310   GFRNONAA >60 2020-12-15 0538   Lipase     Component Value Date/Time   LIPASE 29  11/13/2020 0054       Studies/Results: DG Abd 1 View  Result Date: 11/22/2020 CLINICAL DATA:  Orogastric tube placement. EXAM: ABDOMEN - 1 VIEW COMPARISON:  Film earlier today at 1036 hours FINDINGS: Stable positioning feeding tube which is partially visualized. A newly placed orogastric tube follows the exact course of the feeding tube with the tip located approximately at the level of the second portion of the duodenum. Visualized bowel gas is unremarkable. IMPRESSION: Orogastric tube follows the course of the feeding tube with the tip located in approximately the second portion of the duodenum. Electronically Signed   By: Irish Lack M.D.   On: 11/22/2020 16:44     Assessment/Plan This is a 55 year old male with necrotizing pancreatitis with infected necrosis, who presented with pneumoperitoneum and is now 1 month status post exploratory laparotomy with pancreatic debridement and drain placement.  The original surgical drains have been dislodged and the patient now has 3 percutaneous drains in the retroperitoneum.  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  He has undrained solid necrosis that is not being controlled with the current percutaneous drains, which have been upsized.  Laparotomy at this point will be high risk due to the timing from his last abdominal operation.  This will proceed with retroperitoneal debridement, with an approach from the  left flank following his current left-sided drain tract.  We will also plan for placement of a large bore drain and will obtain cultures.  Had a discussion with the patient's wife at bedside today and he had phone sister.  Emphasized that he will have a prolonged hospitalization will need further procedures, but that I do think this is still a survivable condition. They expressed understanding and provided consent to proceed with surgery today.   LOS: 34 days    Sophronia Simas, MD [facility] General, Hepatobiliary and Pancreatic Surgery 12/03/2020 11:38 AM

## 2020-11-24 NOTE — Transfer of Care (Signed)
Immediate Anesthesia Transfer of Care Note  Patient: Paul Chambers  Procedure(s) Performed: RETROPERITONEAL PANCREATIC DEBRIDEMENT; Placement of drain (Left: Flank)  Patient Location: ICU  Anesthesia Type:General  Level of Consciousness: sedated and Patient remains intubated per anesthesia plan  Airway & Oxygen Therapy: Patient remains intubated per anesthesia plan and Patient placed on Ventilator (see vital sign flow sheet for setting)  Post-op Assessment: Report given to RN  Post vital signs: Reviewed and stable  Last Vitals:  Vitals Value Taken Time  BP 97/54 11/21/2020 1400  Temp    Pulse 66 12/08/2020 1407  Resp 23 11/16/2020 1408  SpO2 92 % 11/23/2020 1407  Vitals shown include unvalidated device data.  Last Pain:  Vitals:   12/02/2020 0700  TempSrc: Axillary  PainSc:       Patients Stated Pain Goal: 4 (11/10/20 1800)  Complications: No notable events documented.

## 2020-11-24 NOTE — Progress Notes (Signed)
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  Assessment:  [name] presented on 9/8 with abdominal pain and AMS found to have necrotizing pancreatitis s/p ex-lap on 9/9 and debridement.  Patient was on TPN and then transitioned to TF.  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  He is s/p surgical drain upsized.  Pharmacy consulted to resume TPN since TF is on hold on 10/4.  Glucose / Insulin: no hx DM, A1c 6.8% - BG up 221 Required 29 units SSI in last 24 hr, 40 units in TPN Electrolytes: all WNL, K 3.9 < goal of 4, Mg 1.9 < goal of 2 Renal: SCr < 1, BUN WNL Hepatic: LFTs / tbili / TG WNL, albumin < 1.5 Intake / Output; MIVF: UOP 4L with Lasix, drain 12/4, net - GI Imaging: 9/8 CT abd and pelvis - evidence of necrotic pancreatitis  9/8 CT - extensive pneumoperitoneum 9/16 CT - large amount of residual gas and fluid dissecting through retroperitoneum, no evidence of enteric fistula or leak  9/19 abd XR - feeding catheter in distal duodenum/proximal jejunum 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 10/10 CT abd - small bowel dilation w/ potential ileus  GI Surgeries / Procedures:  9/9 Ex lap and pancreatic debridement  9/26 IR placed 5 surgical abdominal drains, 2 removed by patient later 10/5 IR surgical drains upsized 10/12 debridement and drain upsize planned  Central access: PICC replaced 11/18/20 TPN start date: 9/10 >> 9/24, restarted 11/18/20  Nutritional Goals, RD Estimated Needs Total Energy Estimated Needs: 2350-2550 Total Protein Estimated Needs: 125-145 grams Total Fluid Estimated Needs: >/= 2.0 L  Current Nutrition:  TPN  Plan:  Continue concentrated TPN at goal rate 80 ml/hr to provide 135g AA, 334g CHO and 72g ILE  for a total of 2395 kCal, meeting 100% of patient's needs Electrolytes in TPN: Na 20mEq/L, K 64mEq/L, Ca to 2.59mEq/L, Mg 70mEq/L, Phos 63mmol/L, Cl:Ac 1:1 Continue standard MVI and trace elements to TPN.  Add thiamine/folate for hx EtOH use. Continue rSSI Q4H and increase regular insulin from 40>>50 units in TPN Standard TPN labs on Mon/Thurs. Bolus Mg 2g this AM and increase Mg to 12mEq/L in TPN.  Thank you for allowing pharmacy to participate in this patient's care.  4m, PharmD PGY1 Acute Care Resident  12/01/20,12:49 PM

## 2020-11-24 NOTE — Progress Notes (Signed)
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 Patient is s/p exlap for necrosis I&D, and drains placement. He has had ongoing intermittent fever/severe sepsis in setting of persistent/new intraaabdominal abscesses.   Course complicated by prolonged ileus requiring tpn via picc line  As of 11/22/2020 has 3 intraabdominal drains. One RUQ drain and the LLQ were exchanged/upsized on 10/05.  Previous micro: 9/26 RUQ perihepatic abscess culture from new drain -- e faecalis  --------- 10/12 assessment 10/08 last abd ct showed 3 discrete area of fluid collection. He just finished a course of pna treatment with addition of mrsa coverage without affecting sepsis He currently remains on tpn, intubated -- however no recent blood culture 10/06 sputum cx continues to show steno colonizer 10/10 bcx ngtd. Previous bcx this admission negative He has picc line RUE that was changed 10/06    His vent setting had increased from 40% fio2 to 60% today from the last 24 hours Surgery team plans on more drainage today of the necrotic fluid collection around the pancreatic bed, for concern the debrids too large to pass through the current drains. I have requested more culture  Await  micro workup for malessezia furfur   Plan: Continue piptazo; had stopped eraxis 10/11 F/u fungal bcx for malessezia furfur Discussed with surgery to send sample for cx if further abd abscess I&D to be done   I spent more than 35 minute reviewing data/chart, and coordinating care and >50% direct face to face time providing counseling/discussing diagnostics/treatment plan with patient   Principal Problem:   Acute necrotizing pancreatitis Active Problems:   Toxic encephalopathy   Severe sepsis (HCC)   Hyponatremia   Hypoalbuminemia   Macrocytic anemia   Pressure injury of skin   Protein-calorie malnutrition, severe   Intra-abdominal abscess (HCC)   Acute respiratory failure with hypoxia (HCC)   Septic shock (HCC)   Cardiac arrest (HCC)   Abscess   No Known Allergies  Scheduled Meds:  sodium chloride   Intravenous Once   arformoterol  15 mcg Nebulization BID   chlorhexidine gluconate (MEDLINE KIT)  15 mL Mouth Rinse BID   Chlorhexidine Gluconate Cloth  6 each Topical Q0600   docusate  100 mg Per Tube BID   guaiFENesin  5 mL Per Tube Q8H   heparin  5,000 Units Subcutaneous Q8H   insulin aspart  0-20 Units Subcutaneous Q4H   mouth rinse  15 mL Mouth Rinse 10 times per day   pantoprazole (PROTONIX) IV  40 mg Intravenous Q24H   polyethylene glycol  17 g Per Tube Daily   revefenacin  175 mcg  Nebulization Daily   sodium chloride flush  10-40 mL Intracatheter Q12H   sodium chloride flush  10-40 mL Intracatheter Q12H   sodium chloride flush  50 mL Intracatheter Q8H   sodium chloride flush  50 mL Intracatheter Q8H   sodium chloride flush  50 mL Intracatheter Q8H   spironolactone  25 mg Per Tube Daily   Continuous Infusions:  sodium chloride 10 mL/hr at 12/04/2020 1000   dexmedetomidine (PRECEDEX) IV infusion 1.1 mcg/kg/hr (11/22/2020 1000)   HYDROmorphone 3 mg/hr (12/12/2020 1000)   magnesium sulfate bolus IVPB     piperacillin-tazobactam (ZOSYN)  IV Stopped (11/22/2020 0819)    TPN ADULT (ION) 80 mL/hr at 11/21/2020 1000   TPN ADULT (ION)     PRN Meds:.sodium chloride, acetaminophen (TYLENOL) oral liquid 160 mg/5 mL, albuterol, fentaNYL (SUBLIMAZE) injection, fentaNYL (SUBLIMAZE) injection, haloperidol lactate, HYDROmorphone, midazolam, ondansetron (ZOFRAN) IV, sodium chloride flush   SUBJECTIVE: Patient currently in icu, intubated, on sedation He remains on tpn Drains continue to have purulent output His chest xray remains table bilateral basilar atelectasis. His vent setting is minimal and 40% today 10/10   Review of Systems: ROS All other ROS was negative, except mentioned above     OBJECTIVE: Vitals:   11/15/2020 0815 12/02/2020 0835 12/12/2020 0945 11/16/2020 1000  BP: (!) 90/52  130/84 (!) 115/59  Pulse: 64 100 99 82  Resp: (!) 22 (!) 21 (!) 24 20  Temp:      TempSrc:      SpO2: 95% 96% (!) 89% 94%  Weight:      Height:       Body mass index is 30.41 kg/m.  Physical Exam General/constitutional: no distress, pleasant HEENT: Normocephalic, PER, Conj Clear, EOMI, Oropharynx clear Neck supple CV: rrr no mrg Lungs: clear and comfortable on vent; 60% fio2 5 peep today 10/12 Ext: anasarca Skin: No Rash Neuro: sedated/intubated MSK: no peripheral joint swelling/tenderness/warmth Abd: Soft, Nontender -- midline wound no sign of infection; 2 ruq drain and 1 llq drain with purulent output  Central line presence: yes RUE picc site no purulence/erythema   Lab Results Lab Results  Component Value Date   WBC 9.6 11/25/2020   HGB 7.3 (L) 12/05/2020   HCT 23.8 (L) 12/06/2020   MCV 102.1 (H) 12/02/2020   PLT 142 (L) 11/14/2020    Lab Results  Component Value Date   CREATININE 0.65 11/25/2020   BUN 18 11/13/2020   NA 141 12/10/2020   K 3.9 12/03/2020   CL 101 11/22/2020   CO2 31 11/18/2020    Lab Results  Component Value Date   ALT 15 11/20/2020   AST 16 11/20/2020   ALKPHOS 70 11/20/2020   BILITOT 0.6 11/20/2020       Microbiology: Recent Results (from the past 240 hour(s))  Culture, Respiratory w Gram Stain     Status: None   Collection Time: 11/15/20  3:23 PM   Specimen: Tracheal Aspirate; Respiratory  Result Value Ref Range Status   Specimen Description TRACHEAL ASPIRATE  Final   Special Requests NONE  Final   Gram Stain   Final    ABUNDANT WBC PRESENT,BOTH PMN AND MONONUCLEAR ABUNDANT GRAM NEGATIVE RODS MODERATE GRAM POSITIVE RODS    Culture   Final    ABUNDANT STENOTROPHOMONAS MALTOPHILIA MULTI-DRUG RESISTANT ORGANISM RESULT CALLED TO, READ BACK BY AND VERIFIED WITH: Annetta Maw RN, [phone] 11/18/20 [facility] Performed at [facility], [place] [address] [place], [place] [address]    Report  Status 11/19/2020 FINAL  Final   Organism ID, Bacteria STENOTROPHOMONAS MALTOPHILIA  Final      Susceptibility   Stenotrophomonas maltophilia - MIC*    LEVOFLOXACIN >=8 RESISTANT Resistant     TRIMETH/SULFA >=320 RESISTANT Resistant     * ABUNDANT STENOTROPHOMONAS MALTOPHILIA  Culture, Respiratory w Gram Stain     Status: None   Collection Time: 11/22/20 10:44 AM   Specimen: Tracheal Aspirate; Respiratory  Result Value Ref Range Status   Specimen Description TRACHEAL ASPIRATE  Final   Special Requests NONE  Final   Gram Stain   Final    NO SQUAMOUS EPITHELIAL CELLS SEEN FEW WBC SEEN MODERATE GRAM NEGATIVE RODS Performed at [facility], [address] [place], [place] [address]    Culture MODERATE STENOTROPHOMONAS MALTOPHILIA  Final   Report Status 12/02/2020 FINAL  Final   Organism ID, Bacteria STENOTROPHOMONAS MALTOPHILIA  Final      Susceptibility   Stenotrophomonas maltophilia - MIC*    LEVOFLOXACIN 4 INTERMEDIATE Intermediate     TRIMETH/SULFA <=20 SENSITIVE Sensitive     * MODERATE STENOTROPHOMONAS MALTOPHILIA  Culture, blood (routine x 2)     Status: None (Preliminary result)   Collection Time: 11/22/20  3:05 PM   Specimen: BLOOD  Result Value Ref Range Status    Specimen Description BLOOD LEFT ANTECUBITAL  Final   Special Requests IN PEDIATRIC BOTTLE Blood Culture adequate volume  Final   Culture   Final    NO GROWTH 2 DAYS Performed at [facility], [place] [address], [place], [place] [address]    Report Status PENDING  Incomplete  Culture, blood (routine x 2)     Status: None (Preliminary result)   Collection Time: 11/22/20  3:09 PM   Specimen: BLOOD LEFT HAND  Result Value Ref Range Status   Specimen Description BLOOD LEFT HAND  Final   Special Requests IN PEDIATRIC BOTTLE Blood Culture adequate volume  Final   Culture   Final    NO GROWTH 2 DAYS Performed at [facility], [place] [address], [place], [place] [address]    Report Status PENDING  Incomplete  Fungus culture, blood     Status: None (Preliminary result)   Collection Time: 11/22/20  3:09 PM   Specimen: BLOOD LEFT HAND  Result Value Ref Range Status   Specimen Description BLOOD LEFT HAND  Final   Special Requests IN PEDIATRIC BOTTLE Blood Culture adequate volume  Final   Culture   Final    NO GROWTH 2 DAYS Performed at [facility], [place] [address], [place],  [address]    Report Status PENDING  Incomplete     Serology:   Imaging: If present, new imagings (plain films, ct scans, and mri) have been personally visualized and interpreted; radiology reports have been reviewed. Decision making incorporated into the Impression / Recommendations.  10/08 ct abd with contrast 1. CT abdomen findings are not substantially changed since 11/16/2020, with stable sequela of necrotizing pancreatitis including replacement of much of the distal pancreatic body and pancreatic tail with large gas-containing thick walled fluid collection extending into the anterior paranephric left retroperitoneal space and into the transverse mesocolon. Additional peripancreatic head and anterior right paranephric retroperitoneal gas containing fluid collection, gas containing  perihepatic fluid collection and low left retroperitoneal gas containing fluid collection intimately involving the left iliopsoas muscle are not substantially changed. Bilateral percutaneous pigtail drainage catheters remain well positioned within these collections. 2. Generalized mild dilatation  of the visualized small and large bowel with scattered small bowel and large bowel fluid levels, compatible with adynamic ileus. 3. Small to moderate dependent right pleural effusion and moderate dependent left pleural effusion, increased. 4. Mild to moderate anasarca. 5. Aortic Atherosclerosis   10/10 cxr Unchanged small to moderate bilateral pleural effusions and left greater than right basilar consolidation or atelectasis  Jabier Mutton, [place] [facility] [place] [phone] pager    12/06/2020, 10:34 AM

## 2020-11-24 NOTE — Progress Notes (Signed)
OT Cancellation Note  Patient Details Name: Paul Chambers MRN: [id number] DOB: [birth year]   Cancelled Treatment:    Reason Eval/Treat Not Completed: Patient at procedure or test/ unavailable.  Scheduled for surgery this date, OT to continues efforts.  Ioma Chismar D Jaquasia Doscher 12/09/2020, 7:59 AM

## 2020-11-24 NOTE — Anesthesia Preprocedure Evaluation (Addendum)
Anesthesia Evaluation  Patient identified by MRN, date of birth, ID band Patient unresponsive  General Assessment Comment:Pt sedated  Reviewed: Allergy & Precautions, NPO status , Patient's Chart, lab work & pertinent test results, Unable to perform ROS - Chart review only  History of Anesthesia Complications Negative for: history of anesthetic complications  Airway Mallampati: Intubated       Dental  (+) Poor Dentition   Pulmonary COPD,  COPD inhaler, Current Smoker and Patient abstained from smoking.,  VDRF s/p complex abdominal surgery: intubated   + rhonchi        Cardiovascular hypertension, Pt. on medications  Rhythm:Regular Rate:Normal  10/2020 ECHO: EF 65-70%, normal LVF with no regional wall motion abnormalities, no significant valvular abnormalities   Neuro/Psych    GI/Hepatic GERD  Medicated,(+)     substance abuse  alcohol use, Necrotizing pancreatitis with infected necrosis, who presented with pneumoperitoneum and is now 1 month status post exploratory laparotomy with pancreatic debridement and drain placement   Endo/Other  negative endocrine ROS  Renal/GU negative Renal ROS     Musculoskeletal   Abdominal   Peds  Hematology  (+) Blood dyscrasia (Hb 7.3), anemia ,   Anesthesia Other Findings   Reproductive/Obstetrics                            Anesthesia Physical Anesthesia Plan  ASA: 4  Anesthesia Plan: General   Post-op Pain Management:    Induction: Intravenous  PONV Risk Score and Plan: 1 and Treatment may vary due to age or medical condition  Airway Management Planned: Oral ETT  Additional Equipment: None  Intra-op Plan:   Post-operative Plan: Post-operative intubation/ventilation  Informed Consent: I have reviewed the patients History and Physical, chart, labs and discussed the procedure including the risks, benefits and alternatives for the proposed  anesthesia with the patient or authorized representative who has indicated his/her understanding and acceptance.   Patient has DNR.  Discussed DNR with power of attorney and Suspend DNR.   Dental advisory given, Consent reviewed with POA and History available from chart only  Plan Discussed with: CRNA and Surgeon  Anesthesia Plan Comments: (Discussed with pt's wife by telephone)       Anesthesia Quick Evaluation

## 2020-11-24 NOTE — Progress Notes (Signed)
NAME:  Paul Chambers, MRN:  [id number], DOB:  August 31, 1965, LOS: 34 ADMISSION DATE:  10/19/2020, CONSULTATION DATE:  11/14/2020 REFERRING MD:  Dr. Tereasa Coop, Reason for consult: Acute respiratory failure  History of Present Illness:  Patient was encephalopathic and/or intubated. Therefore history has been obtained from chart review.   Paul Chambers, is a 55 y.o. male, who was admitted to [facility] on 10/30/2020 with abdominal pain.  With a pertinent pmx of alcoholism, HTN, gout, pancreatitis, tobacco use disorder.  He was found to have pneumoperitoneum without clear perforation. He was started on vanc and zosyn. He underwent ex lap on 9/9 and was found to have a large amount of necrotic fluid surrounding his pancreas, which was concerning for necrotic infected pancreas, two drains were placed. No bowel perforation was noted.  His hospital course has been complicated by acute respiratory failure secondary to possible aspiration pneumonia, acute blood loss anemia requiring transfusion, delirium, severe malnutrition requiring TPN. Was weaned of TPN and TF was started. He continued to spike fevers and was tachycardic despite antibiotics. ID consulted on 9/24 and changed abx to IV zosyn. Repeat CT demonsdtated new perihepatic fluid. On 9/26 IR placed 3 new drains. Fluid cultures resulted with enterococcus and clostridium.  The early morning of 10/2 PCCM was called to bedside for O2 sats in the 70's with the patient not responding to noxious stimuli. The patient was emergently intubated. Shortly after the patient had a PEA arrest with 2 minutes of CPR until ROSC. 1 epi was given. The patient was transferred to ICU.  Pertinent  Medical History  alcoholism, HTN, gout, pancreatitis, tobacco abuse, cirrhosis   Significant Hospital Events: Including procedures, antibiotic start and stop dates in addition to other pertinent events   9/8 - Presented to [facility] with abdominal pain. CT A/P with extensive pneumoperitoneum 9/9 -  Ex-lap (no colonic perforation discovered, large necrotic fluid present surrounding pancreas. Transferred to ICU post-op, intubated and sedated.  9/9-9/15 Fluconazole, 9/9-9/15 linezolid, zosyn 9/9-9/15, meropenem 9/15-9/19, levofloxacin 9/19-9/24, flagyl 9/19-9/24, zosyn 9/24-9/27, unasyn 9/27-9/30, zosyn 9/30-present, vancomycin 10/2-10/3, linezolid 10/3, Eraxis 10/4 9/10-received 1 unit PRBC for hemoglobin 6.7, went back on Levophed for short period of time 9/12 - Extubated to Venturi mask 9/13 - Off Fentanyl and Propofol gtt 9/15 Transferred to [facility] 9/24 IV Zosyn 9/26 CT a/p showed perihepatic fluid, IR Drains placed 10/2 PCCM consulted for respiratory failure, intubated, PEA arrest s/p intubation, 2 minutes CPR, Transferred to icu 10/4 CT A/P with colonic communication between transverse colon into mesocolon and necrotic collection, significant retroperitoneal collections 10/5 IR for exchange of drains. SBT x 1 hour the increased RR. Completed thiamine and folate. 10/6 : Levophed discontinued, TNA started 10/7 : Tracheal Aspirate + for stenotrophomonas Maltophilia>> ID suspect this is a colonization not treating, but will continue to monitor 10/8: CT A/P overall unchanged with nec pancreatitis, collections unchanged, bilateral pleural effusions, anasarca. Started on Dilaudid gtt for pain control. 10/10: hgb 6.6, transfusing 1uprbc, wbc 10.9 from 6.9 10/11: no acute events overnight. Dht changed to og for ileus. Anticipate OR time in am.  10/12: plan for retroperitoneal debridement in OR today  Interim History / Subjective:  10/12: Pt intubated, sedated, and unable to participate.  Plan for OR today for debridement.    Objective   Blood pressure (!) 87/48, pulse 67, temperature 98.1 F (36.7 C), temperature source Axillary, resp. rate (!) 22, height 5\' 9"  (1.753 m), weight 93.4 kg, SpO2 95 %.    Vent Mode: PRVC FiO2 (%):  [  40 %] 40 % Set Rate:  [22 bmp] 22 bmp Vt Set:  [420 mL] 420  mL PEEP:  [5 cmH20] 5 cmH20 Plateau Pressure:  [17 cmH20-21 cmH20] 17 cmH20   Intake/Output Summary (Last 24 hours) at 12/01/2020 0816 Last data filed at 11/28/2020 0600 Gross per 24 hour  Intake 3466.72 ml  Output 4040 ml  Net -573.28 ml   Filed Weights   11/21/20 0228 11/22/20 0413 11/23/20 0500  Weight: 99.4 kg 100.9 kg 93.4 kg   Physical Exam: General: Chronically ill-appearing, sedated and unresponsive HENT: NCAT, ETT in place, mmmp Eyes: PERRLA, pupils: 2, no scleral icterus Respiratory: Clear to auscultation bilaterally.  No crackles, wheezing or rales Cardiovascular: RRR, -M/R/G, no JVD GI: hypoactive bowel sounds, ttp, protuberant with increased distention, multiple JP drains in place with fluid, midline incision with gauze in place Extremities: +2 pitting edema in lower extremities, no tenderness Neuro: sedated unresponsive on vent  Resolved Hospital Problem list   In hospital PEA arrest Transaminitis AKI Acute metabolic encephalopathy Septic Shock Diarrhea  Assessment & Plan:   Acute hypoxemic respiratory failure secondary to aspiration pneumonitis/pneumonia, effusions, atelectasis +Steno Maltophilia in respiratory culture however thought to be colonization -- LTVV. Wean FIO2/PEEP for goal 88-95% -- copious thick secretions -- Antibiotics per ID. -- repeat resp cx still with steno --will get intraop cx as well.  --hold diuresis today --Daily SBT/WUA -- Guaifenesin and chest PT   Septic shock - resolved.  Secondary necrotizing pancreatitis, intra-abdominal abscess, aspiration pneumonia  Colonic communication w/ necrotic collection JP drain Cx: rare enterococcus faecalis, clostridium IR is following for drain management Plan: -- OR today, 10/12, for retroperitoneal debridement  -- Continue linezolid and Zosyn ID -- Cont. post-op care + TPN nutrition: Surgery expects prolonged hospitalization that is standard for his condition.  -- OK to cont. enteric  meds per surgery  Acute blood loss anemia  Chronic anemia Hemoglobin down-trending 10/12 Plan: -- Monitor for S/Sx of bleeding -- Transfuse PRBC if HBG less than 7 -- Active T&S  DM2 Patient previously on novolog 8u q6h, semeglee 20u qd +SSI.  Plan -- Trend CBGs -- SSI  -- Cont. TPN  Likely alcoholic cirrhosis  HX ETOH abuse INR elevated >2 on admission, down trending. RUQ [place] 10/2 with nodular contour and ascites.  Plan: -- Cont spironolactone  Severe protein calorie malnutrition Plan: -- Cont. TPN  Thrombocytopenia:  Plts trending up, still <150 Plan: -- Trend CBC  Ileus:  Hypoactive bowel sounds. KUB results from 10/10: "unchanged mild small bowel dilatation which may reflect ileus." Placed OGT. Plan: -- Cont. Decompression w/ OGT   Best Practice (right click and "Reselect all SmartList Selections" daily)   Diet/type: NPO  and TNA DVT prophylaxis: prophylactic heparin  GI prophylaxis: PPI Lines: yes and it is still needed Foley:  Yes, and it is still needed Code Status:  Partial Code Last date of multidisciplinary goals of care discussion:  10/6>> See Note by Dr. Francine Graven Updated wife at bedside on 10/11   Critical care time: The patient is critically ill with multiple organ systems failure and requires high complexity decision making for assessment and support, frequent evaluation and titration of therapies, application of advanced monitoring technologies and extensive interpretation of multiple databases.  Critical care time 39 mins. This represents my time independent of the NPs time taking care of the pt. This is excluding procedures.    Briant Sites DO [facility] 11/14/2020, 11:21 AM See Amion for pager If  no response to pager, please call [phone] until 1900 After 1900 please call [facility] [phone]

## 2020-11-24 NOTE — Anesthesia Postprocedure Evaluation (Signed)
Anesthesia Post Note  Patient: Dyllon Henken  Procedure(s) Performed: RETROPERITONEAL PANCREATIC DEBRIDEMENT; Placement of drain (Left: Flank)     Patient location during evaluation: ICU Anesthesia Type: General Level of consciousness: sedated and patient remains intubated per anesthesia plan Pain management: pain level controlled Vital Signs Assessment: post-procedure vital signs reviewed and stable Respiratory status: patient remains intubated per anesthesia plan and patient on ventilator - see flowsheet for VS Cardiovascular status: blood pressure returned to baseline and stable Postop Assessment: no apparent nausea or vomiting Anesthetic complications: no Comments: Remains critically ill and ventilated   No notable events documented.  Last Vitals:  Vitals:   12/11/2020 1644 11/26/2020 1648  BP:  (!) 106/59  Pulse: 78 82  Resp: (!) 25 (!) 24  Temp:    SpO2: 96% 95%    Last Pain:  Vitals:   12/08/2020 1508  TempSrc: Axillary  PainSc:                  Laiklynn Raczynski,E. Blimie Vaness

## 2020-11-25 ENCOUNTER — Encounter (HOSPITAL_COMMUNITY): Payer: Self-pay | Admitting: Surgery

## 2020-11-25 DIAGNOSIS — K651 Peritoneal abscess: Secondary | ICD-10-CM | POA: Diagnosis not present

## 2020-11-25 DIAGNOSIS — K631 Perforation of intestine (nontraumatic): Secondary | ICD-10-CM | POA: Diagnosis not present

## 2020-11-25 DIAGNOSIS — K8591 Acute pancreatitis with uninfected necrosis, unspecified: Secondary | ICD-10-CM | POA: Diagnosis not present

## 2020-11-25 LAB — POCT I-STAT 7, (LYTES, BLD GAS, ICA,H+H)
Acid-Base Excess: 5 mmol/L — ABNORMAL HIGH (ref 0.0–2.0)
Bicarbonate: 30.2 mmol/L — ABNORMAL HIGH (ref 20.0–28.0)
Calcium, Ion: 1.43 mmol/L — ABNORMAL HIGH (ref 1.15–1.40)
HCT: 27 % — ABNORMAL LOW (ref 39.0–52.0)
Hemoglobin: 9.2 g/dL — ABNORMAL LOW (ref 13.0–17.0)
O2 Saturation: 93 %
Patient temperature: 99.5
Potassium: 3.9 mmol/L (ref 3.5–5.1)
Sodium: 141 mmol/L (ref 135–145)
TCO2: 32 mmol/L (ref 22–32)
pCO2 arterial: 50.6 mmHg — ABNORMAL HIGH (ref 32.0–48.0)
pH, Arterial: 7.385 (ref 7.350–7.450)
pO2, Arterial: 69 mmHg — ABNORMAL LOW (ref 83.0–108.0)

## 2020-11-25 LAB — CBC
HCT: 26.1 % — ABNORMAL LOW (ref 39.0–52.0)
Hemoglobin: 8.3 g/dL — ABNORMAL LOW (ref 13.0–17.0)
MCH: 31.6 pg (ref 26.0–34.0)
MCHC: 31.8 g/dL (ref 30.0–36.0)
MCV: 99.2 fL (ref 80.0–100.0)
Platelets: 140 10*3/uL — ABNORMAL LOW (ref 150–400)
RBC: 2.63 MIL/uL — ABNORMAL LOW (ref 4.22–5.81)
RDW: 19.5 % — ABNORMAL HIGH (ref 11.5–15.5)
WBC: 10.6 10*3/uL — ABNORMAL HIGH (ref 4.0–10.5)
nRBC: 0 % (ref 0.0–0.2)

## 2020-11-25 LAB — MAGNESIUM: Magnesium: 2.3 mg/dL (ref 1.7–2.4)

## 2020-11-25 LAB — GLUCOSE, CAPILLARY
Glucose-Capillary: 163 mg/dL — ABNORMAL HIGH (ref 70–99)
Glucose-Capillary: 177 mg/dL — ABNORMAL HIGH (ref 70–99)
Glucose-Capillary: 181 mg/dL — ABNORMAL HIGH (ref 70–99)
Glucose-Capillary: 195 mg/dL — ABNORMAL HIGH (ref 70–99)
Glucose-Capillary: 225 mg/dL — ABNORMAL HIGH (ref 70–99)
Glucose-Capillary: 238 mg/dL — ABNORMAL HIGH (ref 70–99)

## 2020-11-25 LAB — POTASSIUM: Potassium: 3.9 mmol/L (ref 3.5–5.1)

## 2020-11-25 LAB — PHOSPHORUS: Phosphorus: 4.2 mg/dL (ref 2.5–4.6)

## 2020-11-25 MED ORDER — NALOXONE HCL 0.4 MG/ML IJ SOLN
INTRAMUSCULAR | Status: AC
  Filled 2020-11-25: qty 1

## 2020-11-25 MED ORDER — VITAL 1.5 CAL PO LIQD
1000.0000 mL | ORAL | Status: DC
  Administered 2020-11-25 – 2020-11-27 (×2): 1000 mL

## 2020-11-25 MED ORDER — NALOXONE HCL 0.4 MG/ML IJ SOLN
0.2000 mg | Freq: Once | INTRAMUSCULAR | Status: AC
  Administered 2020-11-25: 0.2 mg via INTRAVENOUS

## 2020-11-25 MED ORDER — CHLORHEXIDINE GLUCONATE 0.12 % MT SOLN
OROMUCOSAL | Status: AC
  Filled 2020-11-25: qty 15

## 2020-11-25 MED ORDER — TRACE MINERALS CU-MN-SE-ZN 300-55-60-3000 MCG/ML IV SOLN
INTRAVENOUS | Status: AC
  Filled 2020-11-25: qty 899.87

## 2020-11-25 NOTE — Progress Notes (Signed)
Upon assessment, patient's bedpad was saturated from what appears to be his new left-sided abdominal drain. The site appears red, edematous, and possible induration. Dressing was changed and surgery MD notified.

## 2020-11-25 NOTE — Progress Notes (Signed)
    1 Day Post-Op  Subjective: Afebrile, hemodynamically stable. Drains functioning. WBC 10.  Objective: Vital signs in last 24 hours: Temp:  [97.6 F (36.4 C)-97.9 F (36.6 C)] 97.6 F (36.4 C) (10/12 2300) Pulse Rate:  [62-105] 62 (10/13 0430) Resp:  [15-25] 22 (10/13 0430) BP: (90-130)/(52-101) 99/57 (10/13 0430) SpO2:  [89 %-99 %] 94 % (10/13 0430) FiO2 (%):  [40 %-100 %] 50 % (10/13 0300) Last BM Date: 11/23/20  Intake/Output from previous day: 10/12 0701 - 10/13 0700 In: 3748.1 [I.V.:2684; Blood:315; NG/GT:70; IV Piggyback:179.1] Out: 2460 [Urine:1060; Emesis/NG output:600; Drains:785; Blood:15] Intake/Output this shift: No intake/output data recorded.  PE: General: intubated, on vent Resp: ETT in place CV: RRR Abdomen: distended, compressible, midline incision granulating. Prior drain sites on right lateral abdomen are pouched and draining minimal stool. RUQ drain with feculent/purulent output, RLQ drain with scant purulent fluid. LUQ large-bore drain to -20 suction, draining feculent/purulent output.   Lab Results:  Recent Labs    12/12/2020 0538 11/25/20 0408  WBC 9.6 10.6*  HGB 7.3* 8.3*  HCT 23.8* 26.1*  PLT 142* 140*   BMET Recent Labs    11/23/20 1750 12/01/2020 0538  NA 138 141  K 3.8 3.9  CL 102 101  CO2 31 31  GLUCOSE 174* 221*  BUN 16 18  CREATININE 0.61 0.65  CALCIUM 9.4 9.9   PT/INR No results for input(s): LABPROT, INR in the last 72 hours. CMP     Component Value Date/Time   NA 141 11/20/2020 0538   K 3.9 12/08/2020 0538   CL 101 12/09/2020 0538   CO2 31 11/28/2020 0538   GLUCOSE 221 (H) 11/17/2020 0538   BUN 18 12/03/2020 0538   CREATININE 0.65 11/14/2020 0538   CALCIUM 9.9 11/25/2020 0538   PROT 5.5 (L) 11/20/2020 0310   ALBUMIN <1.5 (L) 11/20/2020 0310   AST 16 11/20/2020 0310   ALT 15 11/20/2020 0310   ALKPHOS 70 11/20/2020 0310   BILITOT 0.6 11/20/2020 0310   GFRNONAA >60 11/27/2020 0538   Lipase     Component Value  Date/Time   LIPASE 29 11/13/2020 0054       Studies/Results: No results found.   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  He is status post exploratory laparotomy with pancreatic debridement and drain placement on 9/9.  Postoperatively he has had persistent undrained necrosis after dislodgment of the surgical drains and placement of percutaneous drains. Now s/p retroperitoneal pancreatic debridement with placement of a large-bore drain into the left retroperitoneal drain on 10/12. - Keep right sided drains to bulb suction and flush with 73ml q8h - Left sided drain should be kept on low intermittent suction (set at ), to be flushed with 100 ml sterile saline q8h to promote continued drainage of solid necrosis and stool. - Patient is having some output of stool via the prior drain sites on the anterior abdomina, and these have been pouched. - New intraop cultures pending, continue antibiotics - Continue TPN - Patient will ultimately require a colon resection for definitive management of his colonic fistula, but for now hopefully we can achieve source control with wide drainage and allow a controlled colonic fistula to form. - Surgery will continue follow closely    LOS: 35 days    Sophronia Simas, MD [facility] General, Hepatobiliary and Pancreatic Surgery 11/25/20 8:00 AM

## 2020-11-25 NOTE — Progress Notes (Signed)
PT Cancellation Note  Patient Details Name: Paul Chambers MRN: [id number] DOB: 09/17/1965   Cancelled Treatment:    Reason Eval/Treat Not Completed: Patient not medically ready (pt with hopeful plans for extubation and per RN and MD hold at this time)   [name] [name] 11/25/2020, 10:33 AM [name], PT Acute Rehabilitation Services Pager: [phone] Office: [phone]

## 2020-11-25 NOTE — Progress Notes (Signed)
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  Assessment:  [name] presented on 9/8 with abdominal pain and AMS found to have necrotizing pancreatitis s/p ex-lap on 9/9 and debridement.  Patient was on TPN and then transitioned to TF.  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  He is s/p surgical drain upsized.  Pharmacy consulted to resume TPN since TF is on hold on 10/4.  Glucose / Insulin: no hx DM, A1c 6.8% - BG in goal Required 30 units SSI in last 24 hr, 50 units in TPN Electrolytes: all WNL, K 3.9 < goal of 4, Mg 2.3 (goal of 2) Renal: SCr < 1, BUN WNL Hepatic: LFTs / tbili / TG WNL, albumin < 1.5 Intake / Output; MIVF: UOP 2.7L s/p Lasix and albumin, drain 12/4, emesis/NG , net +343L GI Imaging: 9/8 CT abd and pelvis - evidence of necrotic pancreatitis  9/8 CT - extensive pneumoperitoneum 9/16 CT - large amount of residual gas and fluid dissecting through retroperitoneum, no evidence of enteric fistula or leak  9/19 abd XR - feeding catheter in distal duodenum/proximal jejunum 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 10/10 CT abd - small bowel dilation w/ potential ileus  GI Surgeries / Procedures:  9/9 Ex lap and pancreatic debridement  9/26 IR placed 5 surgical abdominal drains, 2 removed by patient later 10/5 IR surgical drains upsized 10/12 retroperitoneal pancreatic debridement and large-bore drain placement   Central access: PICC replaced 11/18/20 TPN start date: 9/10 >> 9/24, restarted 11/18/20  Nutritional Goals, RD Estimated Needs Total Energy Estimated Needs: 2350-2550 Total Protein Estimated Needs: 125-145 grams Total Fluid Estimated Needs: >/= 2.0 L  Current Nutrition:  TPN  Plan:  Continue concentrated  TPN at goal rate 80 ml/hr to provide 135g AA, 334g CHO and 72g ILE for a total of 2395 kCal, meeting 100% of patient's needs Electrolytes in TPN: Na 63mEq/L, K 36mEq/L, Ca to 2.69mEq/L, Mg 3mEq/L, Phos 20mmol/L, Cl:Ac 1:1 Continue standard MVI and trace elements to TPN.  Add thiamine/folate for hx EtOH use. Continue rSSI Q4H and increase regular insulin from 50>>60 units in TPN Standard TPN labs on Mon/Thurs. Increase K to 55mEq/L (providing 1.36mEq/kg)  in TPN.  Thank you for allowing pharmacy to participate in this patient's care.  37m, PharmD PGY1 Acute Care Resident  11/25/2020,9:14 AM

## 2020-11-25 NOTE — Progress Notes (Signed)
NAME:  Paul Chambers, MRN:  [id number], DOB:  09-Mar-1965, LOS: 35 ADMISSION DATE:  11-05-2020, CONSULTATION DATE:  11/14/2020 REFERRING MD:  Dr. Tereasa Coop, Reason for consult: Acute respiratory failure  History of Present Illness:  Patient was encephalopathic and/or intubated. Therefore history has been obtained from chart review.   [name], is a 55 y.o. male, who was admitted to [facility] on 2020-11-05 with abdominal pain.  With a pertinent pmx of alcoholism, HTN, gout, pancreatitis, tobacco use disorder.  He was found to have pneumoperitoneum without clear perforation. He was started on vanc and zosyn. He underwent ex lap on 9/9 and was found to have a large amount of necrotic fluid surrounding his pancreas, which was concerning for necrotic infected pancreas, two drains were placed. No bowel perforation was noted.  His hospital course has been complicated by acute respiratory failure secondary to possible aspiration pneumonia, acute blood loss anemia requiring transfusion, delirium, severe malnutrition requiring TPN. Was weaned of TPN and TF was started. He continued to spike fevers and was tachycardic despite antibiotics. ID consulted on 9/24 and changed abx to IV zosyn. Repeat CT demonsdtated new perihepatic fluid. On 9/26 IR placed 3 new drains. Fluid cultures resulted with enterococcus and clostridium.  The early morning of 10/2 PCCM was called to bedside for O2 sats in the 70's with the patient not responding to noxious stimuli. The patient was emergently intubated. Shortly after the patient had a PEA arrest with 2 minutes of CPR until ROSC. 1 epi was given. The patient was transferred to ICU.  Pertinent  Medical History  alcoholism, HTN, gout, pancreatitis, tobacco abuse, cirrhosis   Significant Hospital Events: Including procedures, antibiotic start and stop dates in addition to other pertinent events   9/8 - Presented to [facility] with abdominal pain. CT A/P with extensive pneumoperitoneum 9/9 -  Ex-lap (no colonic perforation discovered, large necrotic fluid present surrounding pancreas. Transferred to ICU post-op, intubated and sedated.  9/9-9/15 Fluconazole, 9/9-9/15 linezolid, zosyn 9/9-9/15, meropenem 9/15-9/19, levofloxacin 9/19-9/24, flagyl 9/19-9/24, zosyn 9/24-9/27, unasyn 9/27-9/30, zosyn 9/30-present, vancomycin 10/2-10/3, linezolid 10/3, Eraxis 10/4 9/10-received 1 unit PRBC for hemoglobin 6.7, went back on Levophed for short period of time 9/12 - Extubated to Venturi mask 9/13 - Off Fentanyl and Propofol gtt 9/15 Transferred to [facility] 9/24 IV Zosyn 9/26 CT a/p showed perihepatic fluid, IR Drains placed 10/2 PCCM consulted for respiratory failure, intubated, PEA arrest s/p intubation, 2 minutes CPR, Transferred to icu 10/4 CT A/P with colonic communication between transverse colon into mesocolon and necrotic collection, significant retroperitoneal collections 10/5 IR for exchange of drains. SBT x 1 hour the increased RR. Completed thiamine and folate. 10/6 : Levophed discontinued, TNA started 10/7 : Tracheal Aspirate + for stenotrophomonas Maltophilia>> ID suspect this is a colonization not treating, but will continue to monitor 10/8: CT A/P overall unchanged with nec pancreatitis, collections unchanged, bilateral pleural effusions, anasarca. Started on Dilaudid gtt for pain control. 10/10: hgb 6.6, transfusing 1uprbc, wbc 10.9 from 6.9 10/11: no acute events overnight. Dht changed to og for ileus. Anticipate OR time in am.  10/12: retroperitoneal debridement in OR  Interim History / Subjective:  10/13: s/p OR time for debridement. No complications appreciated. Surgery remains following for drains and wounds as well. Intraoperative cx pending. Remains on abx. Still becomes agitated intermittently and not redirectable. Afebrile for 24h.    Objective   Blood pressure (!) 94/56, pulse 65, temperature 97.6 F (36.4 C), temperature source Oral, resp. rate (!) 22, height 5'  9"  (1.753 m), weight 93.4 kg, SpO2 94 %.    Vent Mode: PRVC FiO2 (%):  [50 %-100 %] 50 % Set Rate:  [22 bmp] 22 bmp Vt Set:  [420 mL] 420 mL PEEP:  [5 cmH20] 5 cmH20 Plateau Pressure:  [17 cmH20-19 cmH20] 17 cmH20   Intake/Output Summary (Last 24 hours) at 11/25/2020 0840 Last data filed at 11/25/2020 0800 Gross per 24 hour  Intake 4013.28 ml  Output 1990 ml  Net 2023.28 ml   Filed Weights   11/21/20 0228 11/22/20 0413 11/23/20 0500  Weight: 99.4 kg 100.9 kg 93.4 kg   Physical Exam: General: Chronically ill-appearing, sedated and unresponsive HENT: NCAT, ETT in place, mmmp Eyes: PERRLA, pupils: 2, no scleral icterus Respiratory: Clear to auscultation bilaterally.  No crackles, wheezing or rales Cardiovascular: RRR, -M/R/G, no JVD GI: hypoactive bowel sounds, ttp, protuberant with increased distention, multiple JP drains in place with fluid, midline incision with gauze in place Extremities: +2 pitting edema in lower extremities, no tenderness Neuro: sedated unresponsive on vent  Resolved Hospital Problem list   In hospital PEA arrest Transaminitis AKI Acute metabolic encephalopathy Septic Shock Diarrhea  Assessment & Plan:   Acute hypoxemic respiratory failure secondary to aspiration pneumonitis/pneumonia, effusions, atelectasis +Steno Maltophilia in respiratory culture however thought to be colonization -- LTVV. Wean FIO2/PEEP for goal 88-95% -- secretions improving.  -- Antibiotics per ID. -- repeat resp cx still with steno but this is sensitive to tmp-smx --follow intraop cx as well.  --cont to hold diuresis today --Daily SBT/WUA -- Guaifenesin and chest PT   Septic shock - resolved.  Secondary necrotizing pancreatitis, intra-abdominal abscess, aspiration pneumonia  Colonic communication w/ necrotic collection JP drain Cx: rare enterococcus faecalis, clostridium IR is following for drain management Plan: -- OR, 10/12, for retroperitoneal debridement  --  Continue Zosyn ID -- Cont. post-op care + TPN nutrition: Surgery expects prolonged hospitalization that is standard for his condition.  -- OK to cont. enteric meds per surgery  Acute blood loss anemia  Chronic anemia Hemoglobin down-trending 10/12 Plan: -- Monitor for S/Sx of bleeding -- Transfuse PRBC if HBG less than 7  DM2 Patient previously on novolog 8u q6h, semeglee 20u qd +SSI.  Plan -- Trend CBGs -- SSI  -- Cont. TPN  Likely alcoholic cirrhosis  HX ETOH abuse INR elevated >2 on admission, down trending. RUQ [place] 10/2 with nodular contour and ascites.  Plan: -- Cont spironolactone  Severe protein calorie malnutrition Plan: -- Cont. TPN  Thrombocytopenia:  Plts improving Plan: -- Trend CBC  Ileus:  Hypoactive bowel sounds. KUB results from 10/10: "unchanged mild small bowel dilatation which may reflect ileus." Placed OGT. Plan: -- Cont. Decompression w/ OGT   Best Practice (right click and "Reselect all SmartList Selections" daily)   Diet/type: NPO  and TNA DVT prophylaxis: prophylactic heparin  GI prophylaxis: PPI Lines: yes and it is still needed Foley:  Yes, and it is no longer needed Code Status:  Partial Code Last date of multidisciplinary goals of care discussion:  10/6>> See Note by Dr. Francine Graven Updated wife at bedside on 10/11   Critical care time: The patient is critically ill with multiple organ systems failure and requires high complexity decision making for assessment and support, frequent evaluation and titration of therapies, application of advanced monitoring technologies and extensive interpretation of multiple databases.  Critical care time 37 mins. This represents my time independent of the NPs time taking care of the pt. This is excluding procedures.  Briant Sites DO [facility] 11/25/2020, 8:40 AM See Amion for pager If no response to pager, please call [phone] until 1900 After 1900 please call [facility]  [phone]

## 2020-11-25 NOTE — Progress Notes (Signed)
Nutrition Follow-up  DOCUMENTATION CODES:  Severe malnutrition in context of acute illness/injury  INTERVENTION:   Continue TPN dosing per Pharmacy to meet 100% of estimated needs.  Start trickle tube feedings with Vital 1.5 at 20 ml/h to provide 720 kcals, 32 gm protein, 367 ml free water daily. Goal rate is 65 ml/h with Prosource TF 45 ml  TID to provide 2460 kcal, 138 gm protein, 1192 ml free water daily.  NUTRITION DIAGNOSIS:  Severe Malnutrition related to acute illness (infected necrotic pancreatitis) as evidenced by moderate fat depletion, moderate muscle depletion, energy intake < or equal to 50% for > or equal to 5 days, percent weight loss (8.4% weight loss in 1.5 months). -- ongoing  GOAL:  Patient will meet greater than or equal to 90% of their needs -- met with TPN   MONITOR:  Diet advancement, Labs, Weight trends, TF tolerance, Skin, I & O's, Other (Comment) (TPN)  REASON FOR ASSESSMENT:  Consult Enteral/tube feeding initiation and management  ASSESSMENT:  55 year old male who presented to the ED on 9/08 with AMS and abdominal pain. Pt seen at [facility] 1 week PTA and was diagnosed with AKI and pancreatitis. PMH of HTN, gout, tobacco abuse, EtOH abuse. Pt admitted with sepsis secondary to infected necrotic pancreatitis.  S/P ex-lap with pancreatic debridement for infected necrotic pancreatitis 9/9.  TPN infusing at 80 ml/h to provide 2396 kcal and 135 gm protein. Meets 100% of patients estimated calorie and protein needs.   Discussed patient in ICU rounds and with RN today. Not following commands today, but hopeful to attempt extubation 10/14. Patient has bowel sounds. Per CCM discussion with Surgery, okay to trial trickle feedings.   Patient remains intubated on ventilator support MV: 10.4 L/min Temp (24hrs), Avg:98.2 F (36.8 C), Min:97.6 F (36.4 C), Max:99.5 F (37.5 C)   Medications reviewed and include Colace, Novolog, Protonix,  Miralax, spironolactone,  Precedex, Dilaudid, IV abx.  Labs reviewed. CBGs: [id number]  Admission weight: 93.7 kg Current weight: 93.4 kg (10/11)  UOP: 1.060 ml x 24 hours Drain output: 785 ml x 24 hours I/O net +12.6 L since admit  Diet Order:   Diet Order             Diet NPO time specified Except for: Sips with Meds  Diet effective midnight                  EDUCATION NEEDS:  No education needs have been identified at this time  Skin:  Skin Assessment: Skin Integrity Issues: Skin Integrity Issues:: Other (Comment) DTI: coccyx Stage I: - Incisions: abdomen Other: 2 puncture wounds to abd from drain removal  Last BM:  10/11 type 7  Height:  Ht Readings from Last 1 Encounters:  11/25/20 5' 9"  (1.753 m)   Weight:  Wt Readings from Last 1 Encounters:  11/23/20 93.4 kg   BMI:  Body mass index is 30.41 kg/m.  Estimated Nutritional Needs:  Kcal:  5188-4166 Protein:  125-145 grams Fluid:  >/= 2.0 L   Lucas Mallow, RD, LDN, CNSC Please refer to Amion for contact information.

## 2020-11-25 NOTE — Progress Notes (Signed)
OT Cancellation Note  Patient Details Name: Paul Chambers MRN: [id number] DOB: December 31, 1965   Cancelled Treatment:    Reason Eval/Treat Not Completed: Patient possibly being extubated.  OT will continue efforts as appropriate.    Jona Zappone D Ved Martos 11/25/2020, 1:35 PM

## 2020-11-25 NOTE — Progress Notes (Signed)
Referring Physician(s): CCS  Supervising Physician: Oley Balm  Patient Status:  [facility] - In-pt  Chief Complaint:  History of necrotizing pancreatitis, bowel perforation and sepsis s/p multiple intra abdominal abscess drains. S/p drain placement and manipulations with IR.  Subjective:  Patient intubated and sedated, wife at the bedside.  Patient underwent pancreatic debridement with 36 fr drain placement in left flank.  The LLQ drain that was placed by IR was removed during surgery yesterday.   Allergies: Patient has no known allergies.  Medications: Prior to Admission medications   Medication Sig Start Date End Date Taking? Authorizing Provider  acetaminophen (TYLENOL) 500 MG tablet Take 500 mg by mouth every 6 (six) hours as needed for mild pain.   Yes [provider]  albuterol (VENTOLIN HFA) 108 (90 Base) MCG/ACT inhaler Inhale 1-2 puffs into the lungs every 6 (six) hours as needed for wheezing or shortness of breath.   Yes [provider]  allopurinol (ZYLOPRIM) 300 MG tablet Take 300 mg by mouth daily.   Yes [provider]  ALPRAZolam Prudy Feeler) 0.5 MG tablet Take 0.5 mg by mouth 3 (three) times daily as needed for anxiety.   Yes [provider]  amLODipine (NORVASC) 5 MG tablet Take 5 mg by mouth daily.   Yes [provider]  Fluticasone-Umeclidin-Vilant (TRELEGY ELLIPTA) 100-62.5-25 MCG/INH AEPB Inhale 1 puff into the lungs daily.   Yes [provider]  folic acid (FOLVITE) 1 MG tablet Take 1 mg by mouth daily.   Yes [provider]  furosemide (LASIX) 40 MG tablet Take 40 mg by mouth 2 (two) times daily.   Yes [provider]  Multiple Vitamins-Minerals (PRESERVISION AREDS 2+MULTI VIT PO) Take 2 tablets by mouth daily.   Yes [provider]  Omega-3 Krill Oil 500 MG CAPS Take 1,000 mg by mouth daily.   Yes [provider]  omeprazole (PRILOSEC) 40 MG capsule Take 40 mg by mouth  daily.   Yes [provider]  oxyCODONE (OXY IR/ROXICODONE) 5 MG immediate release tablet Take 2.5-5 mg by mouth every 6 (six) hours as needed for severe pain.   Yes [provider]  thiamine (VITAMIN B-1) 100 MG tablet Take 100 mg by mouth daily.   Yes [provider]     Vital Signs: BP (!) 178/103   Pulse (!) 113   Temp 97.6 F (36.4 C) (Oral)   Resp (!) 38   Ht 5\' 9"  (1.753 m)   Wt 205 lb 14.6 oz (93.4 kg)   SpO2 96%   BMI 30.41 kg/m   Physical Exam Vitals and nursing note reviewed.  Constitutional:      Appearance: He is well-developed. He is ill-appearing.  HENT:     Head: Normocephalic.  Pulmonary:     Comments: Intubated  Abdominal:     Comments: Positive RUQ drain to a suction bulb. Site is unremarkable with no erythema, edema, tenderness, bleeding or drainage. Suture and stat lock in place. Dressing is clean, dry, and intact. 20 ml of  feculent fluid noted in the bulb.   Positive RLQ drain to a suction bulb. Site is unremarkable with no erythema, edema, tenderness, bleeding or drainage. Suture and stat lock in place. Dressing is clean, dry, and intact. 5 ml of  feculent fluid noted in the bulb.   Musculoskeletal:     Cervical back: Normal range of motion.     Comments: In mittens  Skin:    General: Skin is warm.  Comments: Mild erythema around LLQ drain insertion site. Site TTP. Skin on left hip/posterior lower abdomen is wet with clear leakage from skin.   Neurological:     Mental Status: He is alert.     Comments: Sedated     Imaging: DG Abd 1 View  Result Date: 11/22/2020 CLINICAL DATA:  Orogastric tube placement. EXAM: ABDOMEN - 1 VIEW COMPARISON:  Film earlier today at 1036 hours FINDINGS: Stable positioning feeding tube which is partially visualized. A newly placed orogastric tube follows the exact course of the feeding tube with the tip located approximately at the level of the second portion of the duodenum. Visualized bowel  gas is unremarkable. IMPRESSION: Orogastric tube follows the course of the feeding tube with the tip located in approximately the second portion of the duodenum. Electronically Signed   By: Irish Lack M.D.   On: 11/22/2020 16:44   DG CHEST PORT 1 VIEW  Result Date: 11/22/2020 CLINICAL DATA:  Pneumonia. EXAM: PORTABLE CHEST 1 VIEW COMPARISON:  11/20/2020 FINDINGS: An endotracheal tube remains in place terminating 3-3.5 cm above the carina. An enteric tube courses into the abdomen with tip not imaged. A right PICC terminates over the right atrium. Small to moderate bilateral pleural effusions, hazy right basilar airspace opacity, in dense left basilar opacity are similar to the prior study. No pneumothorax is identified. IMPRESSION: Unchanged small to moderate bilateral pleural effusions and left greater than right basilar consolidation or atelectasis. Electronically Signed   By: Sebastian Ache M.D.   On: 11/22/2020 11:11   DG Abd Portable 1V  Result Date: 11/22/2020 CLINICAL DATA:  Ileus. EXAM: PORTABLE ABDOMEN - 1 VIEW COMPARISON:  CT abdomen 11/20/2020 FINDINGS: A feeding tube terminates in the left mid abdomen at the level of the proximal jejunum. Bilateral abdominal drainage catheters are present. Mild small bowel dilatation is similar to the prior CT. No acute osseous abnormality is seen. IMPRESSION: Unchanged mild small bowel dilatation which may reflect ileus. Electronically Signed   By: Sebastian Ache M.D.   On: 11/22/2020 11:16    Labs:  CBC: Recent Labs    11/22/20 0500 11/22/20 1602 11/23/20 0429 11/18/2020 0538 11/25/20 0408  WBC 10.9*  --  7.6 9.6 10.6*  HGB 6.6* 7.6* 8.3* 7.3* 8.3*  HCT 23.1* 25.5* 28.2* 23.8* 26.1*  PLT 131*  --  126* 142* 140*     COAGS: Recent Labs    10-27-2020 2102 10/22/20 0357 10/22/20 0826 11/20/20 0310  INR 2.1* 2.1* 2.0* 1.3*     BMP: Recent Labs    11/21/20 0650 11/22/20 0500 11/23/20 1750 11/19/2020 0538 11/25/20 0408  NA 140 140  138 141  --   K 3.8 4.1 3.8 3.9 3.9  CL 105 107 102 101  --   CO2 29 28 31 31   --   GLUCOSE 187* 155* 174* 221*  --   BUN 13 14 16 18   --   CALCIUM 8.5* 8.8* 9.4 9.9  --   CREATININE 0.48* 0.54* 0.61 0.65  --   GFRNONAA >60 >60 >60 >60  --      LIVER FUNCTION TESTS: Recent Labs    11/14/20 0510 11/15/20 0455 11/18/20 0549 11/20/20 0310  BILITOT 0.7 0.6 0.5 0.6  AST 23 19 20 16   ALT 14 15 19 15   ALKPHOS 86 73 82 70  PROT 6.1* 6.0* 5.6* 5.5*  ALBUMIN 1.7* 1.7* <1.5* <1.5*     Assessment and Plan:  55 y.o. male inpatient. History of  alcoholism, HTN, pancreatitis, obesity.Presented to the ED at [facility] with abdominal pain. 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 S/p ex lap with debridement of infected necrotic pancreatitis and placement of  2 peripancreatic surgical drains on 9/9  which was removed by patient on 10/1.  IR placed 3 abscess drains on 9/26 due to intraabdominal abscesses. RLQ and LLQ drains were upsized on 11/17/20. LLQ drain was removed during surgery on 10/12.   Drain Location: RLQ Size: Fr size: 16 Fr  Date of placement:   9.26.22 upsized on 10.5.22 Currently to: Drain collection device: suction bulb OP tan, feculent  Drain Location: RUQ Size: Fr size: 14 Fr Date of placement: 9.26.22  Currently to: Drain collection device: suction bulb OP tan, feculent  24 hour output:  Output by Drain (mL) 11/23/20 0701 - 11/23/20 1900 11/23/20 1901 - 11/18/2020 0700 11/15/2020 0701 - 12/02/2020 1900 11/26/2020 1901 - 11/25/20 0700 11/25/20 0701 - 11/25/20 1021  Closed System Drain 2 Lateral RUQ Other (Comment) 14 Fr. 75 50 155 20   Closed System Drain 1 Lateral LLQ 20 Fr. 25 5 0    Closed System Drain 3 Right Hip 16 Fr. 25 10 5 15    Closed System Drain 1 Left Abdomen Other (Comment) 36 Fr.   190 400     Interval imaging/drain manipulation:  9/26: 3 drain placed, RUQ, RLQ, LLQ  10/5: RLQ and LLQ drain upsize  10/8: CT ABD  w/ showed findings not substantially changed  since 11/16/2020 10/12: LLQ drain removed during surgery by CCS  Current examination: RLQ and RUQ drain insertion sites are unremarkable. All 2 drain have sutures and stats lock in place. All 2 drains are dressed appropriately.   Plan: Drains are being flushed by 50 cc NS q 8 hrs per CCS to prevent drain clogging.  Record output Q shift. Dressing changes QD or PRN if soiled.  Call IR APP or on call IR MD if difficulty flushing or sudden change in drain output.  Repeat imaging/possible drain injection once output < 10 mL/QD (excluding flush material.)   IR will continue to follow - please call with questions or concerns.   Electronically Signed: 12/12, PA-C 11/25/2020, 10:21 AM   I spent a total of 15 Minutes at the patient's bedside AND on the patient's hospital floor or unit, greater than 50% of which was counseling/coordinating care for intra abdominal abscess drain placements X 2.

## 2020-11-25 NOTE — Progress Notes (Signed)
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 Patient is s/p exlap for necrosis I&D 9/08, and drains placement. He has had ongoing intermittent fever/severe sepsis in setting of persistent/new intraaabdominal abscesses.   Course complicated by prolonged ileus requiring tpn via picc line  Previous micro: 9/26 RUQ perihepatic abscess culture from new drain -- e faecalis  Patient has had 3 percutaneous drain (2 RUQ on 9/09 during exlap, and 1 LLQ by IR on 9/26; and on 10/05 upsizing of the LLQ and one RUQ drain by IR)  He remains with intermittent sepsis (including bouts presumed pna, treated) despite abx. 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 He underwent surgical I&D of necrosis with a large bore IV catheter (replacing the LLQ drain) on 10/12 to remove necrotic materials not drained by the smaller drains previously.  --------- 10/13 assessment Remains intubated minimal vent setting Patient appears agitated; pulm/ccm pondering  extubation  10/10 bcx and blood fungal cx (for malessezia furfur) ngtd 10/12 surgical cultures growing gnr/gpc   Plan: Continue piptazo Duration unclear in setting colonecrosis fistula F/u blood and surgical culture Discussed with primary team  I spent more than 35 minute reviewing data/chart, and coordinating care and >50% direct face to face time providing counseling/discussing diagnostics/treatment plan with patient   Principal Problem:   Acute necrotizing pancreatitis Active Problems:   Toxic encephalopathy   Severe sepsis (HCC)   Hyponatremia   Hypoalbuminemia   Macrocytic anemia   Pressure injury of skin   Protein-calorie malnutrition, severe   Intra-abdominal abscess (HCC)   Acute respiratory failure with hypoxia (HCC)   Septic shock (HCC)   Cardiac arrest (HCC)   Abscess   No Known Allergies  Scheduled Meds:  sodium chloride   Intravenous Once   sodium chloride   Intravenous Once   arformoterol  15 mcg Nebulization BID   chlorhexidine gluconate (MEDLINE KIT)  15 mL Mouth Rinse BID   Chlorhexidine Gluconate Cloth  6 each Topical Q0600   docusate  100 mg Per Tube BID   guaiFENesin  5 mL Per Tube Q8H   heparin  5,000 Units Subcutaneous Q8H   insulin aspart  0-20 Units Subcutaneous Q4H   mouth rinse  15 mL Mouth Rinse 10 times per day   mupirocin ointment  1 application Nasal BID   pantoprazole (PROTONIX) IV  40 mg Intravenous Q24H   polyethylene glycol  17 g Per Tube  Daily   revefenacin  175 mcg Nebulization Daily   sodium chloride flush  10-40 mL Intracatheter Q12H   sodium chloride flush  10-40 mL Intracatheter Q12H   sodium chloride flush  50 mL Intracatheter Q8H   sodium chloride flush  100 mL Intracatheter Q8H   sodium chloride flush  50 mL Intracatheter Q8H   spironolactone  25 mg Per Tube Daily   Continuous Infusions:  sodium chloride Stopped (11/15/2020 1102)   dexmedetomidine (PRECEDEX) IV infusion 1.2 mcg/kg/hr (11/25/20 0800)   HYDROmorphone 4  mg/hr (11/25/20 0800)   piperacillin-tazobactam (ZOSYN)  IV 12.5 mL/hr at 11/25/20 0800   TPN ADULT (ION) 80 mL/hr at 11/25/20 0800   TPN ADULT (ION)     PRN Meds:.sodium chloride, acetaminophen (TYLENOL) oral liquid 160 mg/5 mL, albuterol, haloperidol lactate, HYDROmorphone, midazolam, ondansetron (ZOFRAN) IV, sodium chloride flush   SUBJECTIVE: Large bore cath replacing LLQ drain -- reviewed op note Patient without fever since 10/09 Agitated; primary team discussing extubation timing No rash Osteomy output minimal    Review of Systems: ROS All other ROS was negative, except mentioned above     OBJECTIVE: Vitals:   11/25/20 0800 11/25/20 0815 11/25/20 0830 11/25/20 1010  BP: (!) 82/64 (!) 98/54 (!) 94/56 (!) 178/103  Pulse: 69 67 65 (!) 113  Resp: (!) 22 (!) 22 (!) 22 (!) 38  Temp:      TempSrc:      SpO2: 94% 93% 94% 96%  Weight:      Height:       Body mass index is 30.41 kg/m.  Physical Exam General/constitutional: moderately distress, eyes open, in restraint HEENT: normocephalic; ett in place Neck supple CV: rrr no mrg Lungs: on vent Ext: anasarca Skin: No Rash Neuro: moving all extremities without obvious deficit MSK: no peripheral joint swelling/tenderness/warmth Abd: Soft, Nontender -- midline wound no sign of infection; 2 ruq drain and 1 llq drain with purulent output  Central line presence: yes RUE picc site no purulence/erythema   Lab Results Lab Results  Component Value Date   WBC 10.6 (H) 11/25/2020   HGB 8.3 (L) 11/25/2020   HCT 26.1 (L) 11/25/2020   MCV 99.2 11/25/2020   PLT 140 (L) 11/25/2020    Lab Results  Component Value Date   CREATININE 0.65 11/30/2020   BUN 18 11/23/2020   NA 141 11/30/2020   K 3.9 11/25/2020   CL 101 11/25/2020   CO2 31 12/08/2020    Lab Results  Component Value Date   ALT 15 11/20/2020   AST 16 11/20/2020   ALKPHOS 70 11/20/2020   BILITOT 0.6 11/20/2020      Microbiology: Recent Results (from  the past 240 hour(s))  Culture, Respiratory w Gram Stain     Status: None   Collection Time: 11/15/20  3:23 PM   Specimen: Tracheal Aspirate; Respiratory  Result Value Ref Range Status   Specimen Description TRACHEAL ASPIRATE  Final   Special Requests NONE  Final   Gram Stain   Final    ABUNDANT WBC PRESENT,BOTH PMN AND MONONUCLEAR ABUNDANT GRAM NEGATIVE RODS MODERATE GRAM POSITIVE RODS    Culture   Final    ABUNDANT STENOTROPHOMONAS MALTOPHILIA MULTI-DRUG RESISTANT ORGANISM RESULT CALLED TO, READ BACK BY AND VERIFIED WITH: Annetta Maw RN, AT [phone] 11/18/20 [facility] Performed at [facility], [place] [address] [place], [place] [address]    Report Status 11/19/2020 FINAL  Final   Organism ID, Bacteria STENOTROPHOMONAS MALTOPHILIA  Final  Susceptibility   Stenotrophomonas maltophilia - MIC*    LEVOFLOXACIN >=8 RESISTANT Resistant     TRIMETH/SULFA >=320 RESISTANT Resistant     * ABUNDANT STENOTROPHOMONAS MALTOPHILIA  Culture, Respiratory w Gram Stain     Status: None   Collection Time: 11/22/20 10:44 AM   Specimen: Tracheal Aspirate; Respiratory  Result Value Ref Range Status   Specimen Description TRACHEAL ASPIRATE  Final   Special Requests NONE  Final   Gram Stain   Final    NO SQUAMOUS EPITHELIAL CELLS SEEN FEW WBC SEEN MODERATE GRAM NEGATIVE RODS Performed at [facility], [address] [place], [place] [address]    Culture MODERATE STENOTROPHOMONAS MALTOPHILIA  Final   Report Status 11/19/2020 FINAL  Final   Organism ID, Bacteria STENOTROPHOMONAS MALTOPHILIA  Final      Susceptibility   Stenotrophomonas maltophilia - MIC*    LEVOFLOXACIN 4 INTERMEDIATE Intermediate     TRIMETH/SULFA <=20 SENSITIVE Sensitive     * MODERATE STENOTROPHOMONAS MALTOPHILIA  Culture, blood (routine x 2)     Status: None (Preliminary result)   Collection Time: 11/22/20  3:05 PM   Specimen: BLOOD  Result Value Ref Range Status   Specimen Description BLOOD LEFT ANTECUBITAL   Final   Special Requests IN PEDIATRIC BOTTLE Blood Culture adequate volume  Final   Culture   Final    NO GROWTH 3 DAYS Performed at [facility], [place] [address], [place], [place] [address]    Report Status PENDING  Incomplete  Culture, blood (routine x 2)     Status: None (Preliminary result)   Collection Time: 11/22/20  3:09 PM   Specimen: BLOOD LEFT HAND  Result Value Ref Range Status   Specimen Description BLOOD LEFT HAND  Final   Special Requests IN PEDIATRIC BOTTLE Blood Culture adequate volume  Final   Culture   Final    NO GROWTH 3 DAYS Performed at [facility], [place] [address], [place], [place] [address]    Report Status PENDING  Incomplete  Fungus culture, blood     Status: None (Preliminary result)   Collection Time: 11/22/20  3:09 PM   Specimen: BLOOD LEFT HAND  Result Value Ref Range Status   Specimen Description BLOOD LEFT HAND  Final   Special Requests IN PEDIATRIC BOTTLE Blood Culture adequate volume  Final   Culture   Final    NO GROWTH 3 DAYS Performed at [facility], [place] [address] [place], [place] [address]    Report Status PENDING  Incomplete  Surgical PCR screen     Status: Abnormal   Collection Time: 11/26/2020 11:03 AM   Specimen: Nasal Mucosa; Nasal Swab  Result Value Ref Range Status   MRSA, PCR POSITIVE (A) NEGATIVE Final    Comment: RESULT CALLED TO, READ BACK BY AND VERIFIED WITH:  JUDY BURROUGHS 11/22/2020 1239 ADL     Staphylococcus aureus POSITIVE (A) NEGATIVE Final    Comment: (NOTE) The Xpert SA Assay (FDA approved for NASAL specimens in patients 9 years of age and older), is one component of a comprehensive surveillance program. It is not intended to diagnose infection nor to guide or monitor treatment. Performed at [facility], [place] [address] [place], [place] [address]   Aerobic/Anaerobic Culture w Gram Stain (surgical/deep wound)     Status: None (Preliminary result)   Collection Time: 11/27/2020 12:53 PM    Specimen: Soft Tissue, Other; Wound  Result Value Ref Range Status  Specimen Description TISSUE  Final   Special Requests PANCREATIC SWAB FOR CULTURE SPEC A  Final   Gram Stain   Final    RARE WBC PRESENT, PREDOMINANTLY MONONUCLEAR MODERATE GRAM NEGATIVE RODS RARE GRAM POSITIVE COCCI RARE GRAM VARIABLE ROD    Culture   Final    NO GROWTH < 24 HOURS Performed at [facility], [place] [address] [place], [place] [address]    Report Status PENDING  Incomplete  Aerobic/Anaerobic Culture w Gram Stain (surgical/deep wound)     Status: None (Preliminary result)   Collection Time: 11/22/2020 12:58 PM   Specimen: Soft Tissue, Other  Result Value Ref Range Status   Specimen Description TISSUE  Final   Special Requests PANCREATIC NECROSIS SPEC B  Final   Gram Stain   Final    RARE WBC PRESENT, PREDOMINANTLY MONONUCLEAR FEW GRAM NEGATIVE RODS FEW GRAM POSITIVE COCCI IN PAIRS    Culture   Final    CULTURE REINCUBATED FOR BETTER GROWTH Performed at [facility], [place] [address] [place], [place] [address]    Report Status PENDING  Incomplete     Serology:   Imaging: If present, new imagings (plain films, ct scans, and mri) have been personally visualized and interpreted; radiology reports have been reviewed. Decision making incorporated into the Impression / Recommendations.  10/08 ct abd with contrast 1. CT abdomen findings are not substantially changed since 11/16/2020, with stable sequela of necrotizing pancreatitis including replacement of much of the distal pancreatic body and pancreatic tail with large gas-containing thick walled fluid collection extending into the anterior paranephric left retroperitoneal space and into the transverse mesocolon. Additional peripancreatic head and anterior right paranephric retroperitoneal gas containing fluid collection, gas containing perihepatic fluid collection and low left retroperitoneal gas containing fluid collection  intimately involving the left iliopsoas muscle are not substantially changed. Bilateral percutaneous pigtail drainage catheters remain well positioned within these collections. 2. Generalized mild dilatation of the visualized small and large bowel with scattered small bowel and large bowel fluid levels, compatible with adynamic ileus. 3. Small to moderate dependent right pleural effusion and moderate dependent left pleural effusion, increased. 4. Mild to moderate anasarca. 5. Aortic Atherosclerosis   10/10 cxr Unchanged small to moderate bilateral pleural effusions and left greater than right basilar consolidation or atelectasis  Jabier Mutton, [place] [facility] [place] [phone] pager    11/25/2020, 11:12 AM

## 2020-11-26 DIAGNOSIS — K8591 Acute pancreatitis with uninfected necrosis, unspecified: Secondary | ICD-10-CM | POA: Diagnosis not present

## 2020-11-26 LAB — GLUCOSE, CAPILLARY
Glucose-Capillary: 194 mg/dL — ABNORMAL HIGH (ref 70–99)
Glucose-Capillary: 196 mg/dL — ABNORMAL HIGH (ref 70–99)
Glucose-Capillary: 200 mg/dL — ABNORMAL HIGH (ref 70–99)
Glucose-Capillary: 215 mg/dL — ABNORMAL HIGH (ref 70–99)
Glucose-Capillary: 217 mg/dL — ABNORMAL HIGH (ref 70–99)
Glucose-Capillary: 227 mg/dL — ABNORMAL HIGH (ref 70–99)

## 2020-11-26 LAB — CBC
HCT: 25.6 % — ABNORMAL LOW (ref 39.0–52.0)
Hemoglobin: 8 g/dL — ABNORMAL LOW (ref 13.0–17.0)
MCH: 30.8 pg (ref 26.0–34.0)
MCHC: 31.3 g/dL (ref 30.0–36.0)
MCV: 98.5 fL (ref 80.0–100.0)
Platelets: 147 10*3/uL — ABNORMAL LOW (ref 150–400)
RBC: 2.6 MIL/uL — ABNORMAL LOW (ref 4.22–5.81)
RDW: 19.1 % — ABNORMAL HIGH (ref 11.5–15.5)
WBC: 10.5 10*3/uL (ref 4.0–10.5)
nRBC: 0 % (ref 0.0–0.2)

## 2020-11-26 LAB — TYPE AND SCREEN
ABO/RH(D): A POS
Antibody Screen: NEGATIVE
Unit division: 0
Unit division: 0
Unit division: 0

## 2020-11-26 LAB — BPAM RBC
Blood Product Expiration Date: 202210292359
Blood Product Expiration Date: 202210292359
Blood Product Expiration Date: 202210302359
ISSUE DATE / TIME: 202210101051
ISSUE DATE / TIME: 202210121251
ISSUE DATE / TIME: 202210121251
Unit Type and Rh: 6200
Unit Type and Rh: 6200
Unit Type and Rh: 6200

## 2020-11-26 LAB — BASIC METABOLIC PANEL
Anion gap: 7 (ref 5–15)
BUN: 22 mg/dL — ABNORMAL HIGH (ref 6–20)
CO2: 28 mmol/L (ref 22–32)
Calcium: 9.8 mg/dL (ref 8.9–10.3)
Chloride: 105 mmol/L (ref 98–111)
Creatinine, Ser: 0.73 mg/dL (ref 0.61–1.24)
GFR, Estimated: >60 mL/min (ref >60)
Glucose, Bld: 242 mg/dL — ABNORMAL HIGH (ref 70–99)
Potassium: 3.9 mmol/L (ref 3.5–5.1)
Sodium: 140 mmol/L (ref 135–145)

## 2020-11-26 LAB — AMMONIA: Ammonia: 37 umol/L — ABNORMAL HIGH (ref 9–35)

## 2020-11-26 MED ORDER — MIDAZOLAM HCL 2 MG/2ML IJ SOLN: 0.5000 mg | Freq: Once | INTRAMUSCULAR | Status: DC

## 2020-11-26 MED ORDER — CLONAZEPAM 0.5 MG PO TABS
0.5000 mg | ORAL_TABLET | ORAL | Status: AC
  Administered 2020-11-26: 0.5 mg
  Filled 2020-11-26: qty 1

## 2020-11-26 MED ORDER — HYDROMORPHONE BOLUS VIA INFUSION
0.5000 mg | Freq: Once | INTRAVENOUS | Status: AC
  Administered 2020-11-26: 0.5 mg via INTRAVENOUS

## 2020-11-26 MED ORDER — CLONAZEPAM 0.5 MG PO TABS
0.5000 mg | ORAL_TABLET | Freq: Two times a day (BID) | ORAL | Status: DC
  Administered 2020-11-26: 0.5 mg
  Filled 2020-11-26: qty 1

## 2020-11-26 MED ORDER — OXYCODONE HCL 5 MG PO TABS
10.0000 mg | ORAL_TABLET | Freq: Four times a day (QID) | ORAL | Status: DC
Start: 2020-11-26 — End: 2020-11-27
  Administered 2020-11-26 – 2020-11-27 (×5): 10 mg
  Filled 2020-11-26 (×4): qty 2

## 2020-11-26 MED ORDER — DEXMEDETOMIDINE HCL IN NACL 400 MCG/100ML IV SOLN
0.0000 ug/kg/h | INTRAVENOUS | Status: DC
  Administered 2020-11-26: 1.6 ug/kg/h via INTRAVENOUS
  Administered 2020-11-26: 1 ug/kg/h via INTRAVENOUS
  Administered 2020-11-26: 1.6 ug/kg/h via INTRAVENOUS
  Administered 2020-11-26 – 2020-11-27 (×2): 1.5 ug/kg/h via INTRAVENOUS
  Administered 2020-11-27 (×2): 1.7 ug/kg/h via INTRAVENOUS
  Administered 2020-11-27: 1 ug/kg/h via INTRAVENOUS
  Administered 2020-11-27 (×3): 1.8 ug/kg/h via INTRAVENOUS
  Administered 2020-11-27: 1.7 ug/kg/h via INTRAVENOUS
  Administered 2020-11-27: 1.8 ug/kg/h via INTRAVENOUS
  Administered 2020-11-27: 1.7 ug/kg/h via INTRAVENOUS
  Administered 2020-11-28: 1.8 ug/kg/h via INTRAVENOUS
  Administered 2020-11-28 (×2): 1.6 ug/kg/h via INTRAVENOUS
  Administered 2020-11-28: 0.4 ug/kg/h via INTRAVENOUS
  Administered 2020-11-28: 1.6 ug/kg/h via INTRAVENOUS
  Administered 2020-11-28: 0.8 ug/kg/h via INTRAVENOUS
  Filled 2020-11-26 (×6): qty 100
  Filled 2020-11-26: qty 200
  Filled 2020-11-26 (×8): qty 100
  Filled 2020-11-26: qty 200
  Filled 2020-11-26 (×2): qty 100

## 2020-11-26 MED ORDER — MIDAZOLAM HCL 2 MG/2ML IJ SOLN
2.0000 mg | Freq: Once | INTRAMUSCULAR | Status: AC
  Administered 2020-11-26: 2 mg via INTRAVENOUS

## 2020-11-26 MED ORDER — INSULIN ASPART 100 UNIT/ML IJ SOLN
3.0000 [IU] | INTRAMUSCULAR | Status: DC
  Administered 2020-11-26 – 2020-11-27 (×6): 3 [IU] via SUBCUTANEOUS

## 2020-11-26 MED ORDER — CLONAZEPAM 1 MG PO TABS
1.0000 mg | ORAL_TABLET | Freq: Two times a day (BID) | ORAL | Status: DC
  Administered 2020-11-26 – 2020-11-27 (×2): 1 mg
  Filled 2020-11-26 (×2): qty 1

## 2020-11-26 MED ORDER — TRACE MINERALS CU-MN-SE-ZN 300-55-60-3000 MCG/ML IV SOLN
INTRAVENOUS | Status: DC
  Filled 2020-11-26: qty 899.87

## 2020-11-26 MED ORDER — OXYCODONE HCL 5 MG PO TABS
10.0000 mg | ORAL_TABLET | Freq: Once | ORAL | Status: AC
Start: 2020-11-26 — End: 2020-11-26
  Administered 2020-11-26: 10 mg
  Filled 2020-11-26: qty 2

## 2020-11-26 MED ORDER — POTASSIUM CHLORIDE 10 MEQ/100ML IV SOLN
10.0000 meq | INTRAVENOUS | Status: AC
Start: 2020-11-26 — End: 2020-11-26
  Administered 2020-11-26 (×2): 10 meq via INTRAVENOUS
  Filled 2020-11-26 (×2): qty 100

## 2020-11-26 MED ORDER — BETHANECHOL CHLORIDE 10 MG PO TABS
10.0000 mg | ORAL_TABLET | Freq: Three times a day (TID) | ORAL | Status: DC
  Administered 2020-11-26 – 2020-11-27 (×3): 10 mg
  Filled 2020-11-26 (×3): qty 1

## 2020-11-26 NOTE — Progress Notes (Signed)
NAME:  Paul Chambers, MRN:  [id number], DOB:  04-16-1965, LOS: 36 ADMISSION DATE:  11-01-20, CONSULTATION DATE:  11/14/2020 REFERRING MD:  Dr. Tereasa Coop, Reason for consult: Acute respiratory failure  History of Present Illness:  Patient was encephalopathic and/or intubated. Therefore history has been obtained from chart review.   Paul Chambers, is a 55 y.o. male, who was admitted to [facility] on 2020/11/01 with abdominal pain.  With a pertinent pmx of alcoholism, HTN, gout, pancreatitis, tobacco use disorder.  He was found to have pneumoperitoneum without clear perforation. He was started on vanc and zosyn. He underwent ex lap on 9/9 and was found to have a large amount of necrotic fluid surrounding his pancreas, which was concerning for necrotic infected pancreas, two drains were placed. No bowel perforation was noted.  His hospital course has been complicated by acute respiratory failure secondary to possible aspiration pneumonia, acute blood loss anemia requiring transfusion, delirium, severe malnutrition requiring TPN. Was weaned of TPN and TF was started. He continued to spike fevers and was tachycardic despite antibiotics. ID consulted on 9/24 and changed abx to IV zosyn. Repeat CT demonsdtated new perihepatic fluid. On 9/26 IR placed 3 new drains. Fluid cultures resulted with enterococcus and clostridium.  The early morning of 10/2 PCCM was called to bedside for O2 sats in the 70's with the patient not responding to noxious stimuli. The patient was emergently intubated. Shortly after the patient had a PEA arrest with 2 minutes of CPR until ROSC. 1 epi was given. The patient was transferred to ICU.  Pertinent  Medical History  alcoholism, HTN, gout, pancreatitis, tobacco abuse, cirrhosis   Significant Hospital Events: Including procedures, antibiotic start and stop dates in addition to other pertinent events   9/8 - Presented to [facility] with abdominal pain. CT A/P with extensive pneumoperitoneum 9/9 -  Ex-lap (no colonic perforation discovered, large necrotic fluid present surrounding pancreas. Transferred to ICU post-op, intubated and sedated.  9/9-9/15 Fluconazole, 9/9-9/15 linezolid, zosyn 9/9-9/15, meropenem 9/15-9/19, levofloxacin 9/19-9/24, flagyl 9/19-9/24, zosyn 9/24-9/27, unasyn 9/27-9/30, zosyn 9/30-present, vancomycin 10/2-10/3, linezolid 10/3, Eraxis 10/4 9/10-received 1 unit PRBC for hemoglobin 6.7, went back on Levophed for short period of time 9/12 - Extubated to Venturi mask 9/13 - Off Fentanyl and Propofol gtt 9/15 Transferred to [facility] 9/24 IV Zosyn 9/26 CT a/p showed perihepatic fluid, IR Drains placed 10/2 PCCM consulted for respiratory failure, intubated, PEA arrest s/p intubation, 2 minutes CPR, Transferred to icu 10/4 CT A/P with colonic communication between transverse colon into mesocolon and necrotic collection, significant retroperitoneal collections 10/5 IR for exchange of drains. SBT x 1 hour the increased RR. Completed thiamine and folate. 10/6 : Levophed discontinued, TNA started 10/7 : Tracheal Aspirate + for stenotrophomonas Maltophilia>> ID suspect this is a colonization not treating, but will continue to monitor 10/8: CT A/P overall unchanged with nec pancreatitis, collections unchanged, bilateral pleural effusions, anasarca. Started on Dilaudid gtt for pain control. 10/10: hgb 6.6, transfusing 1uprbc, wbc 10.9 from 6.9 10/11: no acute events overnight. Dht changed to og for ileus. Anticipate OR time in am.  10/12: retroperitoneal debridement in OR  Interim History / Subjective:  Trophic feeds started. Intermittent agitation requiring versed.    Objective   Blood pressure 135/74, pulse 70, temperature 99.2 F (37.3 C), temperature source Axillary, resp. rate 18, height 5\' 9"  (1.753 m), weight 93.7 kg, SpO2 95 %.    Vent Mode: PRVC FiO2 (%):  [46 %-60 %] 50 % Set Rate:  [15 bmp-18 bmp]  18 bmp Vt Set:  [560 mL] 560 mL PEEP:  [5 cmH20] 5 cmH20 Pressure  Support:  [8 cmH20] 8 cmH20 Plateau Pressure:  [20 cmH20-22 cmH20] 22 cmH20   Intake/Output Summary (Last 24 hours) at 11/26/2020 0814 Last data filed at 11/26/2020 4825 Gross per 24 hour  Intake 3704.34 ml  Output 1230 ml  Net 2474.34 ml   Filed Weights   11/22/20 0413 11/23/20 0500 11/26/20 0421  Weight: 100.9 kg 93.4 kg 93.7 kg   Physical Exam: General: Chronically ill-appearing, sedated and unresponsive HENT: NCAT, ETT in place Eyes: PERRLA, pupils, does not track Respiratory: mech breath sounds b/l , equal chest rise Cardiovascular: RRR, no murmur GI: hypoactive BS, multiple pouches over fistulae with stool Extremities: anasarcic, warm Neuro: sedated unresponsive on vent  Intraop cx ngtd  Resolved Hospital Problem list   In hospital PEA arrest Transaminitis AKI Acute metabolic encephalopathy Septic Shock Diarrhea  Assessment & Plan:   Acute hypoxemic respiratory failure secondary to aspiration pneumonitis/pneumonia, effusions, atelectasis +Steno Maltophilia in respiratory culture however thought to be colonization -- full vent support -- wean dilaudid, precedex for RASS -1, will start oxy per G-tube and klonopin given home percocet and benzodiazepine use --ABX per ID --follow intra op culture --diurese for net even today --Daily SBT/WUA --chest PT  Septic shock - resolved.  Secondary necrotizing pancreatitis, intra-abdominal abscess, aspiration pneumonia  Colonic communication w/ necrotic collection JP drain Cx: rare enterococcus faecalis, clostridium IR is following for drain management S/p RP debridement with drain upsizing 10/12 Plan: -- continue zosyn  -- appreciate surgery guidance  Acute blood loss anemia  Chronic anemia Hemoglobin down-trending 10/12. Stable. Plan: -- transfuse for Hb 7  DM2 Patient previously on novolog 8u q6h, semeglee 20u qd +SSI.  Plan -- SSI -- ct TPN  Likely alcoholic cirrhosis  HX ETOH abuse INR elevated >2 on  admission, down trending. RUQ [place] 10/2 with nodular contour and ascites.  Plan: -- ct spironolactone  Severe protein calorie malnutrition Plan: -- ct TPN -- trial of trickle feeds as below  Thrombocytopenia:  Plts improving Plan: -- Trend CBC  Ileus:  Hypoactive bowel sounds. KUB results from 10/10: "unchanged mild small bowel dilatation which may reflect ileus." Placed OGT. Plan: -- leave OGT in place as we attempt trickle feeds -- trickle feeds   Best Practice (right click and "Reselect all SmartList Selections" daily)   Diet/type: NPO  and TNA DVT prophylaxis: prophylactic heparin  GI prophylaxis: PPI Lines: yes and it is still needed Foley:  Yes, and it is no longer needed Code Status:  Partial Code Last date of multidisciplinary goals of care discussion:  10/6>> See Note by Dr. Francine Graven Will discuss with wife at bedside 10/14   Critical care time: The patient is critically ill with multiple organ systems failure and requires high complexity decision making for assessment and support, frequent evaluation and titration of therapies, application of advanced monitoring technologies and extensive interpretation of multiple databases.  Critical care time 38 mins. This represents my time independent of the NPs time taking care of the pt. This is excluding procedures.    Laroy Apple Pulmonary/Critical Care  11/26/2020, 8:14 AM See Amion for pager If no response to pager, please call [phone] until 1900 After 1900 please call ELINK [phone]

## 2020-11-26 NOTE — Progress Notes (Signed)
Progress Note  2 Days Post-Op  Subjective: Stable. Drains functioning. Discussed with RN at bedside.   Objective: Vital signs in last 24 hours: Temp:  [98.4 F (36.9 C)-100.7 F (38.2 C)] 99.2 F (37.3 C) (10/14 0714) Pulse Rate:  [64-127] 70 (10/14 0709) Resp:  [14-38] 18 (10/14 0709) BP: (82-188)/(54-103) 135/74 (10/14 0608) SpO2:  [80 %-96 %] 95 % (10/14 0709) FiO2 (%):  [46 %-60 %] 50 % (10/14 0709) Weight:  [93.7 kg] 93.7 kg (10/14 0421) Last BM Date: 11/23/20  Intake/Output from previous day: 10/13 0701 - 10/14 0700 In: 3835 [I.V.:3297.2; NG/GT:391.3; IV Piggyback:146.5] Out: 1230 [Urine:700; Drains:530] Intake/Output this shift: No intake/output data recorded.  PE: General: intubated, on vent Resp: ETT in place CV: RRR Abdomen: distended, compressible, midline incision granulating. Prior drain sites on right lateral abdomen are pouched and draining minimal stool. RUQ drain with feculent/purulent output, RLQ drain with scant purulent fluid. LUQ large-bore drain to -20 suction, draining feculent/purulent output.   Lab Results:  Recent Labs    11/25/20 0408 11/25/20 1251 11/26/20 0415  WBC 10.6*  --  10.5  HGB 8.3* 9.2* 8.0*  HCT 26.1* 27.0* 25.6*  PLT 140*  --  147*   BMET Recent Labs    11/23/20 1750 11/13/2020 0538 11/25/20 0408 11/25/20 1251  NA 138 141  --  141  K 3.8 3.9 3.9 3.9  CL 102 101  --   --   CO2 31 31  --   --   GLUCOSE 174* 221*  --   --   BUN 16 18  --   --   CREATININE 0.61 0.65  --   --   CALCIUM 9.4 9.9  --   --    PT/INR No results for input(s): LABPROT, INR in the last 72 hours. CMP     Component Value Date/Time   NA 141 11/25/2020 1251   K 3.9 11/25/2020 1251   CL 101 11/28/2020 0538   CO2 31 11/15/2020 0538   GLUCOSE 221 (H) 11/15/2020 0538   BUN 18 12/07/2020 0538   CREATININE 0.65 12/07/2020 0538   CALCIUM 9.9 12/03/2020 0538   PROT 5.5 (L) 11/20/2020 0310   ALBUMIN <1.5 (L) 11/20/2020 0310   AST 16  11/20/2020 0310   ALT 15 11/20/2020 0310   ALKPHOS 70 11/20/2020 0310   BILITOT 0.6 11/20/2020 0310   GFRNONAA >60 11/14/2020 0538   Lipase     Component Value Date/Time   LIPASE 29 11/13/2020 0054       Studies/Results: No results found.  Anti-infectives: Anti-infectives (From admission, onward)    Start     Dose/Rate Route Frequency Ordered Stop   11/17/20 1300  anidulafungin (ERAXIS) 100 mg in sodium chloride 0.9 % 100 mL IVPB  Status:  Discontinued        100 mg 78 mL/hr over 100 Minutes Intravenous Every 24 hours 11/16/20 1141 11/23/20 1021   11/16/20 1300  anidulafungin (ERAXIS) 200 mg in sodium chloride 0.9 % 200 mL IVPB        200 mg 78 mL/hr over 200 Minutes Intravenous  Once 11/16/20 1141 11/16/20 1700   11/15/20 1015  linezolid (ZYVOX) IVPB 600 mg  Status:  Discontinued        600 mg 300 mL/hr over 60 Minutes Intravenous Every 12 hours 11/15/20 0919 11/19/20 1114   11/14/20 1200  vancomycin (VANCOREADY) IVPB 750 mg/150 mL  Status:  Discontinued        750 mg  150 mL/hr over 60 Minutes Intravenous Every 8 hours 11/14/20 0251 11/15/20 0919   11/14/20 0330  vancomycin (VANCOREADY) IVPB 2000 mg/400 mL        2,000 mg 200 mL/hr over 120 Minutes Intravenous  Once 11/14/20 0251 11/14/20 0519   11/12/20 1900  piperacillin-tazobactam (ZOSYN) IVPB 3.375 g        3.375 g 12.5 mL/hr over 240 Minutes Intravenous Every 8 hours 11/12/20 1538     11/09/20 1730  Ampicillin-Sulbactam (UNASYN) 3 g in sodium chloride 0.9 % 100 mL IVPB  Status:  Discontinued        3 g 200 mL/hr over 30 Minutes Intravenous Every 6 hours 11/09/20 1617 11/12/20 1538   11/06/20 1545  piperacillin-tazobactam (ZOSYN) IVPB 3.375 g  Status:  Discontinued        3.375 g 12.5 mL/hr over 240 Minutes Intravenous Every 8 hours 11/06/20 1453 11/09/20 1616   11/01/20 0900  levofloxacin (LEVAQUIN) IVPB 750 mg  Status:  Discontinued        750 mg 100 mL/hr over 90 Minutes Intravenous Every 24 hours 11/01/20 0824  11/06/20 1408   11/01/20 0900  metroNIDAZOLE (FLAGYL) IVPB 500 mg  Status:  Discontinued        500 mg 100 mL/hr over 60 Minutes Intravenous Every 12 hours 11/01/20 0824 11/06/20 1408   10/29/20 2200  meropenem (MERREM) 1 g in sodium chloride 0.9 % 100 mL IVPB  Status:  Discontinued        1 g 200 mL/hr over 30 Minutes Intravenous Every 8 hours 10/29/20 1428 11/01/20 0824   10/28/20 1615  meropenem (MERREM) 2 g in sodium chloride 0.9 % 100 mL IVPB  Status:  Discontinued        2 g 200 mL/hr over 30 Minutes Intravenous Every 8 hours 10/28/20 1515 10/29/20 1428   10/28/20 1445  metroNIDAZOLE (FLAGYL) IVPB 500 mg  Status:  Discontinued        500 mg 100 mL/hr over 60 Minutes Intravenous Every 12 hours 10/28/20 1348 10/28/20 1505   10/25/20 2300  fluconazole (DIFLUCAN) IVPB 400 mg        400 mg 100 mL/hr over 120 Minutes Intravenous Every 24 hours 10/25/20 0742 10/29/20 0718   10/24/20 2300  fluconazole (DIFLUCAN) IVPB 200 mg  Status:  Discontinued        200 mg 100 mL/hr over 60 Minutes Intravenous Every 24 hours 10/24/20 0943 10/25/20 0742   10/22/20 1800  vancomycin (VANCOREADY) IVPB 1500 mg/300 mL  Status:  Discontinued        1,500 mg 150 mL/hr over 120 Minutes Intravenous Every 24 hours 11/06/2020 2225 10/22/20 0759   10/22/20 0900  linezolid (ZYVOX) IVPB 600 mg  Status:  Discontinued        600 mg 300 mL/hr over 60 Minutes Intravenous Every 12 hours 10/22/20 0800 10/27/20 0852   10/22/20 0300  piperacillin-tazobactam (ZOSYN) IVPB 3.375 g  Status:  Discontinued        3.375 g 12.5 mL/hr over 240 Minutes Intravenous Every 8 hours 10/24/2020 2225 10/28/20 1514   10/31/2020 2335  metroNIDAZOLE (FLAGYL) IVPB 500 mg  Status:  Discontinued        500 mg 100 mL/hr over 60 Minutes Intravenous Every 12 hours 10/24/2020 2142 10/22/20 0747   10/28/2020 2248  fluconazole (DIFLUCAN) IVPB 400 mg  Status:  Discontinued        400 mg 100 mL/hr over 120 Minutes Intravenous Every 24 hours 10/22/2020 2142  10/24/20  5638   11/09/2020 1748  vancomycin (VANCOREADY) IVPB 2000 mg/400 mL        2,000 mg 200 mL/hr over 120 Minutes Intravenous  Once 11/10/2020 1706 11/02/2020 2113   10/27/2020 1715  piperacillin-tazobactam (ZOSYN) IVPB 3.375 g        3.375 g 100 mL/hr over 30 Minutes Intravenous  Once 10/28/2020 1706 10/16/2020 1903        Assessment/Plan 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 Sheliah Hatch on 9/9 POD2 s/p retroperitoneal pancreatic debridement with placement of a large-bore drain into the left retroperitoneal drain on 10/12 - Keep right sided drains to bulb suction and flush with 72ml q8h - Left sided drain should be kept on low intermittent suction (set at ), to be flushed with 120 ml sterile saline q8h to promote continued drainage of solid necrosis and stool. - Patient is having some output of stool via the prior drain sites on the anterior abdomen, and these have been pouched. - New intraop cultures pending, continue antibiotics - Continue TPN, trophic TF started 10/13 - Patient will ultimately require a colon resection for definitive management of his colonic fistula, but for now hopefully we can achieve source control with wide drainage and allow a controlled colonic fistula to form. - Surgery will continue follow closely   FEN: NPO, TPN, trophic feeds ID: [email or web address] VTE: SCDs, heparin subq    EtOH abuse Tobacco abuse HTN Gout   LOS: 36 days    Juliet Rude, [facility] Surgery 11/26/2020, 7:50 AM Please see Amion for pager number during day hours 7:00am-4:30pm

## 2020-11-26 NOTE — Progress Notes (Signed)
PHARMACY - TOTAL PARENTERAL NUTRITION CONSULT NOTE  Indication:  Necrotizing pancreatitis with suspected colonic fistula  Patient Measurements: Height: 5\' 9"  (175.3 cm) Weight: 93.7 kg (206 lb 9.1 oz) IBW/kg (Calculated) : 70.7 TPN AdjBW (KG): 78 Body mass index is 30.51 kg/m.  Assessment:  [name] presented on 9/8 with abdominal pain and AMS found to have necrotizing pancreatitis s/p ex-lap on 9/9 and debridement.  Patient was on TPN and then transitioned to TF.  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  He is s/p surgical drain upsized.  Pharmacy consulted to resume TPN since TF is on hold on 10/4.  Glucose / Insulin: no hx DM, A1c 6.8% - BG in goal Required 33 units SSI in last 24 hr, 60 units in TPN Electrolytes: all WNL, K 3.9 (below goal of 4) Renal: SCr < 1, BUN WNL Hepatic: LFTs / tbili / TG WNL, albumin < 1.5 Intake / Output; MIVF: UOP 12/4, drain , net since admit +5L GI Imaging: 9/8 CT abd and pelvis - evidence of necrotic pancreatitis  9/8 CT - extensive pneumoperitoneum 9/16 CT - large amount of residual gas and fluid dissecting through retroperitoneum, no evidence of enteric fistula or leak  9/19 abd XR - feeding catheter in distal duodenum/proximal jejunum 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 10/10 CT abd - small bowel dilation w/ potential ileus  GI Surgeries / Procedures:  9/9 Ex lap and pancreatic debridement  9/26 IR placed 5 surgical abdominal drains, 2 removed by patient later 10/5 IR surgical drains upsized 10/12 retroperitoneal pancreatic debridement and large-bore drain placement   Central access: PICC replaced 11/18/20 TPN start date: 9/10 >> 9/24, restarted 11/18/20  Nutritional Goals, RD Estimated Needs Total Energy Estimated Needs: 2350-2550 Total Protein Estimated Needs: 125-145 grams Total Fluid Estimated Needs: >/= 2.0 L  Current Nutrition:  TPN, trickle feeds (started 10/13, tolerating)  Plan:  Continue concentrated  TPN at goal rate 80 ml/hr to provide 135g AA, 334g CHO and 72g ILE for a total of 2395 kCal, meeting 100% of patient's needs Electrolytes in TPN: Na 69mEq/L, K 52mEq/L, Ca to 2.6mEq/L, Mg 74mEq/L, Phos 7mmol/L, Cl:Ac 1:1 Continue standard MVI and trace elements to TPN.  Add thiamine/folate for hx EtOH use. Continue rSSI Q4H and regular insulin 60 units in TPN. 3 units of TF coverage added today. Standard TPN labs on Mon/Thurs. KCl 20 mEq bolus to keep K above goal of 4.  Thank you for allowing pharmacy to participate in this patient's care.  12m, PharmD PGY1 Acute Care Resident  11/26/2020,10:25 AM

## 2020-11-26 NOTE — Progress Notes (Signed)
PT Cancellation Note  Patient Details Name: Paul Chambers MRN: [id number] DOB: 04/02/1965   Cancelled Treatment:    Reason Eval/Treat Not Completed: Patient not medically ready (pt lethargic after versed and inappropriate for therapy at this time. pt with highly varied presentation between agitation and lethargy making timing therapy challenging at this time)   Kerrilynn Derenzo B Elizzie Westergard 11/26/2020, 8:39 AM [name], PT Acute Rehabilitation Services Pager: [phone] Office: [phone]

## 2020-11-27 ENCOUNTER — Inpatient Hospital Stay (HOSPITAL_COMMUNITY): Payer: BC Managed Care – PPO

## 2020-11-27 DIAGNOSIS — Z515 Encounter for palliative care: Secondary | ICD-10-CM

## 2020-11-27 DIAGNOSIS — Z7189 Other specified counseling: Secondary | ICD-10-CM

## 2020-11-27 DIAGNOSIS — K8591 Acute pancreatitis with uninfected necrosis, unspecified: Secondary | ICD-10-CM | POA: Diagnosis not present

## 2020-11-27 LAB — CBC
HCT: 25 % — ABNORMAL LOW (ref 39.0–52.0)
HCT: 25.2 % — ABNORMAL LOW (ref 39.0–52.0)
Hemoglobin: 7.7 g/dL — ABNORMAL LOW (ref 13.0–17.0)
Hemoglobin: 7.7 g/dL — ABNORMAL LOW (ref 13.0–17.0)
MCH: 30.7 pg (ref 26.0–34.0)
MCH: 30.8 pg (ref 26.0–34.0)
MCHC: 30.8 g/dL (ref 30.0–36.0)
MCHC: 30.6 g/dL (ref 30.0–36.0)
MCV: 99.6 fL (ref 80.0–100.0)
MCV: 100.8 fL — ABNORMAL HIGH (ref 80.0–100.0)
Platelets: 143 10*3/uL — ABNORMAL LOW (ref 150–400)
Platelets: 142 10*3/uL — ABNORMAL LOW (ref 150–400)
RBC: 2.51 MIL/uL — ABNORMAL LOW (ref 4.22–5.81)
RBC: 2.5 MIL/uL — ABNORMAL LOW (ref 4.22–5.81)
RDW: 18.9 % — ABNORMAL HIGH (ref 11.5–15.5)
RDW: 19 % — ABNORMAL HIGH (ref 11.5–15.5)
WBC: 10.9 10*3/uL — ABNORMAL HIGH (ref 4.0–10.5)
WBC: 10.9 10*3/uL — ABNORMAL HIGH (ref 4.0–10.5)
nRBC: 0.2 % (ref 0.0–0.2)
nRBC: 0 % (ref 0.0–0.2)

## 2020-11-27 LAB — BASIC METABOLIC PANEL
Anion gap: 6 (ref 5–15)
BUN: 20 mg/dL (ref 6–20)
CO2: 26 mmol/L (ref 22–32)
Calcium: 9.5 mg/dL (ref 8.9–10.3)
Chloride: 107 mmol/L (ref 98–111)
Creatinine, Ser: 0.71 mg/dL (ref 0.61–1.24)
GFR, Estimated: >60 mL/min (ref >60)
Glucose, Bld: 219 mg/dL — ABNORMAL HIGH (ref 70–99)
Potassium: 4.1 mmol/L (ref 3.5–5.1)
Sodium: 139 mmol/L (ref 135–145)

## 2020-11-27 LAB — HEPATIC FUNCTION PANEL
ALT: 13 U/L (ref 0–44)
AST: 21 U/L (ref 15–41)
Albumin: <1.5 g/dL — ABNORMAL LOW (ref 3.5–5.0)
Alkaline Phosphatase: 113 U/L (ref 38–126)
Bilirubin, Direct: 0.2 mg/dL (ref 0.0–0.2)
Indirect Bilirubin: 0.4 mg/dL (ref 0.3–0.9)
Total Bilirubin: 0.6 mg/dL (ref 0.3–1.2)
Total Protein: 5.9 g/dL — ABNORMAL LOW (ref 6.5–8.1)

## 2020-11-27 LAB — DIFFERENTIAL
Abs Immature Granulocytes: 0 10*3/uL (ref 0.00–0.07)
Basophils Absolute: 0.1 10*3/uL (ref 0.0–0.1)
Basophils Relative: 1 %
Eosinophils Absolute: 0.1 10*3/uL (ref 0.0–0.5)
Eosinophils Relative: 1 %
Lymphocytes Relative: 13 %
Lymphs Abs: 1.4 10*3/uL (ref 0.7–4.0)
Monocytes Absolute: 0.9 10*3/uL (ref 0.1–1.0)
Monocytes Relative: 8 %
Neutro Abs: 8.4 10*3/uL — ABNORMAL HIGH (ref 1.7–7.7)
Neutrophils Relative %: 77 %
nRBC: 0 /100 WBC

## 2020-11-27 LAB — URINALYSIS, COMPLETE (UACMP) WITH MICROSCOPIC
Bacteria, UA: NONE SEEN
Bilirubin Urine: NEGATIVE
Glucose, UA: NEGATIVE mg/dL
Hgb urine dipstick: NEGATIVE
Ketones, ur: NEGATIVE mg/dL
Leukocytes,Ua: NEGATIVE
Nitrite: NEGATIVE
Protein, ur: NEGATIVE mg/dL
Specific Gravity, Urine: 1.011 (ref 1.005–1.030)
pH: 5 (ref 5.0–8.0)

## 2020-11-27 LAB — GLUCOSE, CAPILLARY
Glucose-Capillary: 189 mg/dL — ABNORMAL HIGH (ref 70–99)
Glucose-Capillary: 200 mg/dL — ABNORMAL HIGH (ref 70–99)
Glucose-Capillary: 235 mg/dL — ABNORMAL HIGH (ref 70–99)

## 2020-11-27 MED ORDER — DEXTROSE 5 % IV SOLN: INTRAVENOUS | Status: DC

## 2020-11-27 MED ORDER — MIDAZOLAM HCL 2 MG/2ML IJ SOLN
2.0000 mg | INTRAMUSCULAR | Status: DC | PRN
  Administered 2020-11-27 – 2020-11-29 (×13): 2 mg via INTRAVENOUS
  Filled 2020-11-27 (×12): qty 2

## 2020-11-27 MED ORDER — FUROSEMIDE 10 MG/ML IJ SOLN
20.0000 mg | Freq: Once | INTRAMUSCULAR | Status: AC
  Administered 2020-11-27: 20 mg via INTRAVENOUS
  Filled 2020-11-27: qty 2

## 2020-11-27 MED ORDER — MIDAZOLAM HCL 2 MG/2ML IJ SOLN
2.0000 mg | Freq: Once | INTRAMUSCULAR | Status: AC
  Administered 2020-11-27: 2 mg via INTRAVENOUS

## 2020-11-27 MED ORDER — TRACE MINERALS CU-MN-SE-ZN 300-55-60-3000 MCG/ML IV SOLN
INTRAVENOUS | Status: DC
  Filled 2020-11-27: qty 899.84

## 2020-11-27 MED ORDER — FUROSEMIDE 10 MG/ML IJ SOLN
40.0000 mg | Freq: Once | INTRAMUSCULAR | Status: AC
  Administered 2020-11-27: 40 mg via INTRAVENOUS
  Filled 2020-11-27: qty 4

## 2020-11-27 MED ORDER — DIPHENHYDRAMINE HCL 50 MG/ML IJ SOLN: 25.0000 mg | INTRAMUSCULAR | Status: DC | PRN

## 2020-11-27 MED ORDER — GLYCOPYRROLATE 0.2 MG/ML IJ SOLN: 0.2000 mg | INTRAMUSCULAR | Status: DC | PRN

## 2020-11-27 MED ORDER — POLYVINYL ALCOHOL 1.4 % OP SOLN
1.0000 [drp] | Freq: Four times a day (QID) | OPHTHALMIC | Status: DC | PRN
  Administered 2020-11-27 – 2020-11-29 (×3): 1 [drp] via OPHTHALMIC
  Filled 2020-11-27: qty 15

## 2020-11-27 MED ORDER — HYDROMORPHONE BOLUS VIA INFUSION
2.0000 mg | INTRAVENOUS | Status: DC | PRN
Start: 2020-11-27 — End: 2020-11-30
  Administered 2020-11-28 – 2020-11-29 (×13): 2 mg via INTRAVENOUS
  Filled 2020-11-27: qty 2

## 2020-11-27 MED ORDER — HYDROMORPHONE BOLUS VIA INFUSION
1.0000 mg | INTRAVENOUS | Status: DC | PRN
  Filled 2020-11-27: qty 1

## 2020-11-27 MED ORDER — ACETAMINOPHEN 325 MG PO TABS
650.0000 mg | ORAL_TABLET | Freq: Four times a day (QID) | ORAL | Status: DC | PRN
  Filled 2020-11-27: qty 2

## 2020-11-27 MED ORDER — GLYCOPYRROLATE 0.2 MG/ML IJ SOLN
0.2000 mg | INTRAMUSCULAR | Status: DC | PRN
  Administered 2020-11-27 – 2020-11-29 (×8): 0.2 mg via INTRAVENOUS
  Filled 2020-11-27 (×8): qty 1

## 2020-11-27 MED ORDER — HYDROMORPHONE BOLUS VIA INFUSION
0.5000 mg | INTRAVENOUS | Status: DC | PRN
Start: 2020-11-27 — End: 2020-11-30
  Administered 2020-11-27 – 2020-11-28 (×5): 0.5 mg via INTRAVENOUS
  Filled 2020-11-27: qty 1

## 2020-11-27 MED ORDER — HYDROMORPHONE HCL 1 MG/ML IJ SOLN: 1.0000 mg | Freq: Once | INTRAMUSCULAR | Status: DC

## 2020-11-27 MED ORDER — GLYCOPYRROLATE 1 MG PO TABS
1.0000 mg | ORAL_TABLET | ORAL | Status: DC | PRN
  Filled 2020-11-27: qty 1

## 2020-11-27 MED ORDER — ACETAMINOPHEN 325 MG RE SUPP
650.0000 mg | Freq: Four times a day (QID) | RECTAL | Status: DC | PRN
  Administered 2020-11-29: 650 mg via RECTAL
  Filled 2020-11-27: qty 1
  Filled 2020-11-27 (×2): qty 2

## 2020-11-27 MED ORDER — INSULIN ASPART 100 UNIT/ML IJ SOLN
5.0000 [IU] | INTRAMUSCULAR | Status: DC
  Administered 2020-11-27: 5 [IU] via SUBCUTANEOUS

## 2020-11-27 MED ORDER — GUANFACINE HCL 1 MG PO TABS
1.0000 mg | ORAL_TABLET | Freq: Two times a day (BID) | ORAL | Status: DC
  Administered 2020-11-27: 1 mg
  Filled 2020-11-27 (×2): qty 1

## 2020-11-27 NOTE — Progress Notes (Signed)
Progress Note  3 Days Post-Op  Subjective: Stable. Drains functioning.   Objective: Vital signs in last 24 hours: Temp:  [99.5 F (37.5 C)-101.3 F (38.5 C)] 101.3 F (38.5 C) (10/15 0900) Pulse Rate:  [66-133] 106 (10/15 0721) Resp:  [18-38] 38 (10/15 0721) BP: (98-129)/(56-77) 104/62 (10/15 0630) SpO2:  [90 %-95 %] 94 % (10/15 0721) FiO2 (%):  [50 %-60 %] 60 % (10/15 0721) Weight:  [100.7 kg] 100.7 kg (10/15 0000) Last BM Date: 11/27/20  Intake/Output from previous day: 10/14 0701 - 10/15 0700 In: 5161.7 [I.V.:3646.8; NG/GT:575; IV Piggyback:340] Out: 1768 [Urine:943; Drains:625; Stool:200] Intake/Output this shift: Total I/O In: 311.8 [I.V.:254.5; NG/GT:40; IV Piggyback:17.3] Out: 1250 [Urine:1250]  PE: General: intubated, on vent Resp: ETT in place CV: RRR Abdomen: distended, compressible, midline incision granulating. Prior drain sites on right lateral abdomen are pouched and draining minimal stool. R sided IR drains with purulent and feculent fluid, LUQ large-bore drain to -20 suction, drainage feculent   Lab Results:  Recent Labs    11/26/20 0415 11/27/20 0318  WBC 10.5 10.9*  10.9*  HGB 8.0* 7.7*  7.7*  HCT 25.6* 25.2*  25.0*  PLT 147* 142*  143*   BMET Recent Labs    11/26/20 0854 11/27/20 0318  NA 140 139  K 3.9 4.1  CL 105 107  CO2 28 26  GLUCOSE 242* 219*  BUN 22* 20  CREATININE 0.73 0.71  CALCIUM 9.8 9.5   PT/INR No results for input(s): LABPROT, INR in the last 72 hours. CMP     Component Value Date/Time   NA 139 11/27/2020 0318   K 4.1 11/27/2020 0318   CL 107 11/27/2020 0318   CO2 26 11/27/2020 0318   GLUCOSE 219 (H) 11/27/2020 0318   BUN 20 11/27/2020 0318   CREATININE 0.71 11/27/2020 0318   CALCIUM 9.5 11/27/2020 0318   PROT 5.5 (L) 11/20/2020 0310   ALBUMIN <1.5 (L) 11/20/2020 0310   AST 16 11/20/2020 0310   ALT 15 11/20/2020 0310   ALKPHOS 70 11/20/2020 0310   BILITOT 0.6 11/20/2020 0310   GFRNONAA >60  11/27/2020 0318   Lipase     Component Value Date/Time   LIPASE 29 11/13/2020 0054       Studies/Results: DG Chest Port 1 View  Result Date: 11/27/2020 CLINICAL DATA:  Acute respiratory failure EXAM: PORTABLE CHEST 1 VIEW COMPARISON:  Chest x-rays dated 11/22/2020 and 11/20/2020. FINDINGS: Tubes and lines appear stable in position. Heart size and mediastinal contours are stable. Continued hazy airspace opacities bilaterally, mid and lower lobe predominant, likely due to layering pleural effusions. Probable associated bibasilar atelectasis. No new lung findings. No pneumothorax is seen. IMPRESSION: Stable chest x-ray. Continued hazy airspace opacities bilaterally, mid and lower lobe predominant, likely due to layering pleural effusions with associated bibasilar atelectasis. Electronically Signed   By: Bary Richard M.D.   On: 11/27/2020 06:21    Anti-infectives: Anti-infectives (From admission, onward)    Start     Dose/Rate Route Frequency Ordered Stop   11/17/20 1300  anidulafungin (ERAXIS) 100 mg in sodium chloride 0.9 % 100 mL IVPB  Status:  Discontinued        100 mg 78 mL/hr over 100 Minutes Intravenous Every 24 hours 11/16/20 1141 11/23/20 1021   11/16/20 1300  anidulafungin (ERAXIS) 200 mg in sodium chloride 0.9 % 200 mL IVPB        200 mg 78 mL/hr over 200 Minutes Intravenous  Once 11/16/20 1141 11/16/20 1700  11/15/20 1015  linezolid (ZYVOX) IVPB 600 mg  Status:  Discontinued        600 mg 300 mL/hr over 60 Minutes Intravenous Every 12 hours 11/15/20 0919 11/19/20 1114   11/14/20 1200  vancomycin (VANCOREADY) IVPB 750 mg/150 mL  Status:  Discontinued        750 mg 150 mL/hr over 60 Minutes Intravenous Every 8 hours 11/14/20 0251 11/15/20 0919   11/14/20 0330  vancomycin (VANCOREADY) IVPB 2000 mg/400 mL        2,000 mg 200 mL/hr over 120 Minutes Intravenous  Once 11/14/20 0251 11/14/20 0519   11/12/20 1900  piperacillin-tazobactam (ZOSYN) IVPB 3.375 g        3.375  g 12.5 mL/hr over 240 Minutes Intravenous Every 8 hours 11/12/20 1538     11/09/20 1730  Ampicillin-Sulbactam (UNASYN) 3 g in sodium chloride 0.9 % 100 mL IVPB  Status:  Discontinued        3 g 200 mL/hr over 30 Minutes Intravenous Every 6 hours 11/09/20 1617 11/12/20 1538   11/06/20 1545  piperacillin-tazobactam (ZOSYN) IVPB 3.375 g  Status:  Discontinued        3.375 g 12.5 mL/hr over 240 Minutes Intravenous Every 8 hours 11/06/20 1453 11/09/20 1616   11/01/20 0900  levofloxacin (LEVAQUIN) IVPB 750 mg  Status:  Discontinued        750 mg 100 mL/hr over 90 Minutes Intravenous Every 24 hours 11/01/20 0824 11/06/20 1408   11/01/20 0900  metroNIDAZOLE (FLAGYL) IVPB 500 mg  Status:  Discontinued        500 mg 100 mL/hr over 60 Minutes Intravenous Every 12 hours 11/01/20 0824 11/06/20 1408   10/29/20 2200  meropenem (MERREM) 1 g in sodium chloride 0.9 % 100 mL IVPB  Status:  Discontinued        1 g 200 mL/hr over 30 Minutes Intravenous Every 8 hours 10/29/20 1428 11/01/20 0824   10/28/20 1615  meropenem (MERREM) 2 g in sodium chloride 0.9 % 100 mL IVPB  Status:  Discontinued        2 g 200 mL/hr over 30 Minutes Intravenous Every 8 hours 10/28/20 1515 10/29/20 1428   10/28/20 1445  metroNIDAZOLE (FLAGYL) IVPB 500 mg  Status:  Discontinued        500 mg 100 mL/hr over 60 Minutes Intravenous Every 12 hours 10/28/20 1348 10/28/20 1505   10/25/20 2300  fluconazole (DIFLUCAN) IVPB 400 mg        400 mg 100 mL/hr over 120 Minutes Intravenous Every 24 hours 10/25/20 0742 10/29/20 0718   10/24/20 2300  fluconazole (DIFLUCAN) IVPB 200 mg  Status:  Discontinued        200 mg 100 mL/hr over 60 Minutes Intravenous Every 24 hours 10/24/20 0943 10/25/20 0742   10/22/20 1800  vancomycin (VANCOREADY) IVPB 1500 mg/300 mL  Status:  Discontinued        1,500 mg 150 mL/hr over 120 Minutes Intravenous Every 24 hours 11/04/2020 2225 10/22/20 0759   10/22/20 0900  linezolid (ZYVOX) IVPB 600 mg  Status:   Discontinued        600 mg 300 mL/hr over 60 Minutes Intravenous Every 12 hours 10/22/20 0800 10/27/20 0852   10/22/20 0300  piperacillin-tazobactam (ZOSYN) IVPB 3.375 g  Status:  Discontinued        3.375 g 12.5 mL/hr over 240 Minutes Intravenous Every 8 hours 10/18/2020 2225 10/28/20 1514   10/26/2020 2335  metroNIDAZOLE (FLAGYL) IVPB 500 mg  Status:  Discontinued        500 mg 100 mL/hr over 60 Minutes Intravenous Every 12 hours 10/17/2020 2142 10/22/20 0747   11/06/2020 2248  fluconazole (DIFLUCAN) IVPB 400 mg  Status:  Discontinued        400 mg 100 mL/hr over 120 Minutes Intravenous Every 24 hours 10/18/2020 2142 10/24/20 0943   11/07/2020 1748  vancomycin (VANCOREADY) IVPB 2000 mg/400 mL        2,000 mg 200 mL/hr over 120 Minutes Intravenous  Once 10/14/2020 1706 11/01/2020 2113   10/29/2020 1715  piperacillin-tazobactam (ZOSYN) IVPB 3.375 g        3.375 g 100 mL/hr over 30 Minutes Intravenous  Once 11/08/2020 1706 10/22/2020 1903        Assessment/Plan 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 Sheliah Hatch on 9/9 POD3 s/p retroperitoneal pancreatic debridement with placement of a large-bore drain into the left retroperitoneal drain on 10/12 - Keep right sided drains to bulb suction and flush with 43ml q8h - Left sided drain should be kept on low intermittent suction (set at ), to be flushed with 120 ml sterile saline q8h to promote continued drainage of solid necrosis and stool. - Patient is having some output of stool via the prior drain sites on the anterior abdomen, and these have been pouched. - intraop cultures 10/12 w/ enterococcus faecalis, continue antibiotics - Continue TPN, trophic TF started 10/13 - Patient will ultimately require a colon resection for definitive management of his colonic fistula, but for now hopefully we can achieve source control with wide  drainage and allow a controlled colonic fistula to form. - Surgery will continue follow closely, patient will likely need a scan early next week to evaluate retroperitoneal fluid  FEN: NPO, TPN, trophic feeds ID: zosyn VTE: SCDs, heparin subq    EtOH abuse Tobacco abuse HTN Gout   LOS: 37 days    Adam Phenix, [facility] 11/27/2020, 9:24 AM Please see Amion for pager number during day hours 7:00am-4:30pm

## 2020-11-27 NOTE — Progress Notes (Signed)
PCCM Brief Progress Note  Spoke with the patient's wife. She doesn't think he would find quality of life with trach, prolonged and uncertain recovery away from home acceptable quality of life. We discussed options for his care going forward and she wishes to make him DNR without escalation currently - do not perform invasive procedures, add pressors, transfuse blood products, undergo dialysis. She wishes to speak with her family to see if anyone would like to visit before considering transition to comfort measures only.  Paul Chambers Pulmonary/Critical Care

## 2020-11-27 NOTE — Progress Notes (Signed)
Notified Elink of patients O2 sats at 90%. Patients FiO2 was increased from 50-60% with an increase in sats to 93%. Elink made aware that patient has not had a CXR since 11/22/2020. Will continue to monitor patient closely.

## 2020-11-27 NOTE — Progress Notes (Signed)
eLink Physician-Brief Progress Note Patient Name: Paul Chambers DOB: 05-22-1965 MRN: [id number]   Date of Service  11/27/2020  HPI/Events of Note  Patient with increasing oxygen requirement .  eICU Interventions  Stat portable CXR ordered.        Thomasene Lot Shakhia Gramajo 11/27/2020, 5:53 AM

## 2020-11-27 NOTE — Progress Notes (Signed)
eLink Physician-Brief Progress Note Patient Name: Paul Chambers DOB: 12-30-1965 MRN: [id number]   Date of Service  11/27/2020  HPI/Events of Note  CXR has resulted.  eICU Interventions  CXR reviewed. Lasix 20 mg iv x 1 ordered.        Thomasene Lot Sayer Masini 11/27/2020, 6:34 AM

## 2020-11-27 NOTE — Procedures (Signed)
Extubation Procedure Note  Patient Details:   Name: Paul Chambers DOB: 1965-12-08 MRN: [id number]   Airway Documentation:    Vent end date: 11/27/20 Vent end time: 1458   Evaluation  O2 sats: stable throughout Complications: No apparent complications Patient did tolerate procedure well. Bilateral Breath Sounds: Diminished   No, pt could not speak post extubation.  Pt extubated to room air per physician's order and in accordance with the family's wishes.  Wife at bedside for extubation.  Audrie Lia 11/27/2020, 2:59 PM

## 2020-11-27 NOTE — Progress Notes (Signed)
NAME:  Paul Chambers, MRN:  [id number], DOB:  1965/04/28, LOS: 37 ADMISSION DATE:  10/31/2020, CONSULTATION DATE:  11/14/2020 REFERRING MD:  Dr. Tereasa Coop, Reason for consult: Acute respiratory failure  History of Present Illness:  Patient was encephalopathic and/or intubated. Therefore history has been obtained from chart review.   Paul Chambers, is a 55 y.o. male, who was admitted to [facility] on 11/08/2020 with abdominal pain.  With a pertinent pmx of alcoholism, HTN, gout, pancreatitis, tobacco use disorder.  He was found to have pneumoperitoneum without clear perforation. He was started on vanc and zosyn. He underwent ex lap on 9/9 and was found to have a large amount of necrotic fluid surrounding his pancreas, which was concerning for necrotic infected pancreas, two drains were placed. No bowel perforation was noted.  His hospital course has been complicated by acute respiratory failure secondary to possible aspiration pneumonia, acute blood loss anemia requiring transfusion, delirium, severe malnutrition requiring TPN. Was weaned of TPN and TF was started. He continued to spike fevers and was tachycardic despite antibiotics. ID consulted on 9/24 and changed abx to IV zosyn. Repeat CT demonsdtated new perihepatic fluid. On 9/26 IR placed 3 new drains. Fluid cultures resulted with enterococcus and clostridium.  The early morning of 10/2 PCCM was called to bedside for O2 sats in the 70's with the patient not responding to noxious stimuli. The patient was emergently intubated. Shortly after the patient had a PEA arrest with 2 minutes of CPR until ROSC. 1 epi was given. The patient was transferred to ICU.  Pertinent  Medical History  alcoholism, HTN, gout, pancreatitis, tobacco abuse, cirrhosis   Significant Hospital Events: Including procedures, antibiotic start and stop dates in addition to other pertinent events   9/8 - Presented to [facility] with abdominal pain. CT A/P with extensive pneumoperitoneum 9/9 -  Ex-lap (no colonic perforation discovered, large necrotic fluid present surrounding pancreas. Transferred to ICU post-op, intubated and sedated.  9/9-9/15 Fluconazole, 9/9-9/15 linezolid, zosyn 9/9-9/15, meropenem 9/15-9/19, levofloxacin 9/19-9/24, flagyl 9/19-9/24, zosyn 9/24-9/27, unasyn 9/27-9/30, zosyn 9/30-present, vancomycin 10/2-10/3, linezolid 10/3, Eraxis 10/4 9/10-received 1 unit PRBC for hemoglobin 6.7, went back on Levophed for short period of time 9/12 - Extubated to Venturi mask 9/13 - Off Fentanyl and Propofol gtt 9/15 Transferred to [facility] 9/24 IV Zosyn 9/26 CT a/p showed perihepatic fluid, IR Drains placed 10/2 PCCM consulted for respiratory failure, intubated, PEA arrest s/p intubation, 2 minutes CPR, Transferred to icu 10/4 CT A/P with colonic communication between transverse colon into mesocolon and necrotic collection, significant retroperitoneal collections 10/5 IR for exchange of drains. SBT x 1 hour the increased RR. Completed thiamine and folate. 10/6 : Levophed discontinued, TNA started 10/7 : Tracheal Aspirate + for stenotrophomonas Maltophilia>> ID suspect this is a colonization not treating, but will continue to monitor 10/8: CT A/P overall unchanged with nec pancreatitis, collections unchanged, bilateral pleural effusions, anasarca. Started on Dilaudid gtt for pain control. 10/10: hgb 6.6, transfusing 1uprbc, wbc 10.9 from 6.9 10/11: no acute events overnight. Dht changed to og for ileus. Anticipate OR time in am.  10/12: retroperitoneal debridement in OR  Interim History / Subjective:  Intermittent agitation requiring frequent versed dosing despite addition of klonopin, oxy per OG. O2 requirement has increased, diuresing. Febrile with increased ETT secretions.   Objective   Blood pressure 104/62, pulse 66, temperature 100 F (37.8 C), temperature source Axillary, resp. rate 20, height 5\' 9"  (1.753 m), weight 100.7 kg, SpO2 93 %.    Vent Mode:  PRVC FiO2 (%):  [50  %-60 %] 60 % Set Rate:  [18 bmp] 18 bmp Vt Set:  [560 mL] 560 mL PEEP:  [5 cmH20] 5 cmH20 Plateau Pressure:  [21 cmH20-23 cmH20] 21 cmH20   Intake/Output Summary (Last 24 hours) at 11/27/2020 0719 Last data filed at 11/27/2020 0600 Gross per 24 hour  Intake 5161.72 ml  Output 1768 ml  Net 3393.72 ml   Filed Weights   11/23/20 0500 11/26/20 0421 11/27/20 0000  Weight: 93.4 kg 93.7 kg 100.7 kg   Physical Exam: General: Chronically ill appearing HENT: NCAT, ETT in place with scant secretions when I suction Eyes: PERRL, mech breath sounds b/l, equal chest rise Cardiovascular: RRR no murmur GI: hypoactive bowel sounds, enterocutaneous fistulae with pouches in place containing liquid stool Extremities: anasarcic, warm Neuro: deeply sedated after dilaudid bolus  Intraop cx ngtd  Resolved Hospital Problem list   In hospital PEA arrest Transaminitis AKI Acute metabolic encephalopathy Septic Shock Diarrhea  Assessment & Plan:   Acute hypoxemic respiratory failure secondary to aspiration pneumonitis/pneumonia, effusions, atelectasis +Steno Maltophilia in respiratory culture however thought to be colonization --full vent support --wean dilaudid, precedex for RASS -1 --continue oxycodone 10 q6 --continue klonopin 1 BID --start tenex 1 BID, attempt to wean precedex --if O2 requirement not quickly responding to diuresis then will obtain tracheal aspirate and start coverage for VAP --diurese today  --CPT --ABX per ID --follow intra op culture --SAT  Septic shock - resolved.  Secondary necrotizing pancreatitis, intra-abdominal abscess, aspiration pneumonia  Colonic communication w/ necrotic collection JP drain Cx: rare enterococcus faecalis, clostridium IR is following for drain management S/p RP debridement with drain upsizing 10/12 Febrile 10/14 but HDS. Plan: -- obtain LFTs, cbc/diff, UA. If develops hemodynamic instability will check BCx, tracheal aspirate and add  coverage for VAP, alternatively consider fungal infx -- continue zosyn  -- appreciate surgery guidance  Acute blood loss anemia  Chronic anemia Hemoglobin down-trending 10/12. Stable. Plan: -- transfuse for Hb 7  DM2 Patient previously on novolog 8u q6h, semeglee 20u qd +SSI.  Plan -- SSI -- ct TPN  Likely alcoholic cirrhosis  HX ETOH abuse INR elevated >2 on admission, down trending. RUQ [place] 10/2 with nodular contour and ascites.  Plan: -- ct spironolactone  Severe protein calorie malnutrition Plan: -- ct TPN -- trial of trickle feeds  Thrombocytopenia:  Plan: -- trend CBC  Ileus:  Hypoactive bowel sounds. KUB results from 10/10: "unchanged mild small bowel dilatation which may reflect ileus." Placed OGT. Plan: -- leave OGT in place as we attempt trickle feeds -- trickle feeds   Best Practice (right click and "Reselect all SmartList Selections" daily)   Diet/type: tubefeeds  and TPN DVT prophylaxis: prophylactic heparin  GI prophylaxis: PPI Lines: yes and it is still needed Foley:  Yes, and it is no longer needed Code Status:  Partial Code Last date of multidisciplinary goals of care discussion:  10/14 with wife at bedside, leaning toward extubation without reintubation.    Critical care time: The patient is critically ill with multiple organ systems failure and requires high complexity decision making for assessment and support, frequent evaluation and titration of therapies, application of advanced monitoring technologies and extensive interpretation of multiple databases.  Critical care time 35 mins. This represents my time independent of the NPs time taking care of the pt. This is excluding procedures.    Laroy Apple Pulmonary/Critical Care  11/27/2020, 7:19 AM See Amion for pager If no response to  pager, please call [phone] until 1900 After 1900 please call [facility] [phone]

## 2020-11-27 NOTE — Progress Notes (Signed)
PHARMACY - TOTAL PARENTERAL NUTRITION CONSULT NOTE  Indication:  Necrotizing pancreatitis with suspected colonic fistula  Patient Measurements: Height: 5\' 9"  (175.3 cm) Weight: 100.7 kg (222 lb 0.1 oz) IBW/kg (Calculated) : 70.7 TPN AdjBW (KG): 78 Body mass index is 32.78 kg/m.  Assessment:  [name] presented on 9/8 with abdominal pain and AMS found to have necrotizing pancreatitis s/p ex-lap on 9/9 and debridement. Patient was on TPN and then transitioned to TF. 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 He is s/p surgical drain upsized. Pharmacy consulted to resume TPN since TF is on hold on 10/4.  Glucose / Insulin: no hx DM, A1c 6.8% - CBGs slightly above goal, 189-219 Required 31 units SSI in last 24 hr + 3 units Novolog Q4h + 60 units in TPN Electrolytes: all WNL, K up to 4.1 (s/p K runs x2 yesterday; goal >/=4) Renal: SCr < 1, BUN WNL. Lasix 20mg  IV x 1 already given 10/15 AM with Lasix 40mg  IV x 1 also pending Hepatic: LFTs / tbili / TG WNL, albumin < 1.5 Intake / Output; MIVF: UOP 0.4 ml/kg/hr per documentation, drain output , net since admit +11.8L GI Imaging: 9/8 CT abd and pelvis - evidence of necrotic pancreatitis  9/8 CT - extensive pneumoperitoneum 9/16 CT - large amount of residual gas and fluid dissecting through retroperitoneum, no evidence of enteric fistula or leak  9/19 abd XR - feeding catheter in distal duodenum/proximal jejunum 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 10/10 CT abd - small bowel dilation w/ potential ileus  GI Surgeries / Procedures: 9/9 Ex lap and pancreatic debridement  9/26 IR placed 5 surgical abdominal drains, 2 removed by patient later 10/5 IR surgical drains upsized 10/12 retroperitoneal pancreatic debridement and large-bore drain placement   Central access: PICC replaced 11/18/20 TPN start date: 9/10 >> 9/24, restarted 11/18/20  Nutritional Goals, RD Estimated Needs Total Energy Estimated Needs: 2350-2550 Total Protein  Estimated Needs: 125-145 grams Total Fluid Estimated Needs: >/= 2.0 L  Current Nutrition:  TPN, NPO, trickle feeds (started 10/13, tolerating)  Plan:  Continue concentrated TPN at goal rate 80 ml/hr TPN will provide 135g AA, 334g CHO, and 72g ILE for a total of 2395 kCal, meeting 100% of patient's needs Electrolytes in TPN: Na 81mEq/L, K 75mEq/L, Ca 2.43mEq/L, Mg 81mEq/L, Phos 63mmol/L, Cl:Ac 1:1 Continue standard MVI and trace elements to TPN. Add thiamine/folate for hx EtOH use. Continue resistant SSI Q4H and regular insulin 60 units in TPN bag. Per discussion with CCM, will increase TF coverage to 5 units Q4h today. Monitor TPN labs F/u toleration of trickle tube feeds and plans for advancement   4m, PharmD, BCPS Please check AMION for all [facility] contact numbers Clinical Pharmacist 11/27/2020 8:02 AM

## 2020-11-27 NOTE — Consult Note (Signed)
Palliative Medicine Inpatient Consult Note  Consulting Provider: Meier, Nathaniel M, MD  Reason for consult:   Palliative Care Consult Services Palliative Medicine Consult  Reason for Consult? anticipate high dose of analgesia and sedation    HPI:  Per intake H&P --> Paul Chambers, is a 55 y.o. male, who was admitted to [facility] on 10/28/2020 with abdominal pain. With a pertinent pmx of alcoholism, HTN, gout, pancreatitis, tobacco use disorder.  Palliative care has been asked to get involved in the setting of a very complicated prolonged hospitalization inclusive of necrotizing pancreatitis, acute hypoxemic respiratory failure, and septic shock.  Per discussion with intensivist patient's wife has opted to transition to comfort measures though patient's palliative sedation may require larger doses given patient's prior history of alcohol abuse syndrome and active pain/terminal delirium.  Clinical Assessment/Goals of Care:  *Please note that this is a verbal dictation therefore any spelling or grammatical errors are due to the "Dragon Medical One" system interpretation.  I have reviewed medical records including EPIC notes, labs and imaging, received report from bedside RN, assessed the patient who is intubated and sedated.    I met with patient's spouse, Paul Chambers to further discuss diagnosis prognosis, GOC, EOL wishes, disposition and options.  A review of Prem's past medical history and prolonged hospitalization were had.  Paul Chambers shares that he has suffered tremendously over this past month and she believes his present state is never how he would want to live.   I introduced Palliative Medicine as specialized medical care for people living with serious illness. It focuses on providing relief from the symptoms and stress of a serious illness. The goal is to improve quality of life for both the patient and the family.  Paul Chambers is from [place], [place].  He has been married to his spouse for over  18 years.  He has 2 sons one is 26 years old well the other 16 years old.  He worked as an electrician.  He is a man who loves being outdoors, boating and kayaking.  He also enjoyed grilling and every Sunday would grill a big dinner.  He is a man of faith and practices within the [employer] den[employer].  Prior to hospitalization, Paul Chambers was very mobile and able to perform all of his own B ADLs and IADLs without assistance.  A detailed discussion was had today regarding advanced directives, patient's wife shares that he does not have a living well though she has been in contact with the patient's siblings and they have all agreed that he would never wish to live a life where he were dependent on other people.    Concepts specific to code status, artifical feeding and hydration, continued IV antibiotics and rehospitalization was had.  Patient is DO NOT RESUSCITATE CODE STATUS. We talked about transition to comfort measures in house and what that would entail inclusive of medications to control pain, dyspnea, agitation, nausea, itching, and hiccups.  We discussed stopping all uneccessary measures such as mechanical ventilation, blood draws, needle sticks, and frequent vital signs. Utilized reflective listening throughout our time together given the difficulty of this conversation. _____________________ While at bedside I coordinated with patient's bedside RN, Samantha to ensure comfort during compassionate extubation.  Patient is presently on a Dilaudid drip the ceiling dose was increased, a Precedex drip bolus doses were increased as needed, and midazolam as needed.  Explained to patient's spouse that Paul Chambers's timeline would likely be short which she is aware of.  She shares Paul Chambers if he   were in his right mind would have "cut this off" weeks ago as he would never wish to live like this.  Remained present at bedside to verify adequate symptom control.  Decision Maker: Paul Chambers (Spouse) -  [phone]  SUMMARY OF RECOMMENDATIONS   DNAR/DNI  Comfort care  Comfort medications per MAR: Dilaudid drip, Precedex drip, midazolam every 15 minutes as needed  Unrestricted visitation  Chaplain support offered and declined by patient's family  Ongoing palliative support during patient's end-of-life journey  Code Status/Advance Care Planning: DNAR/DNI   Palliative Prophylaxis:  Oral care, turn every 2 hours, suction as needed, pain and agitation management  Additional Recommendations (Limitations, Scope, Preferences): Comfort care    Psycho-social/Spiritual:  Desire for further Chaplaincy support: No-declined Additional Recommendations: Education on critical illness and compassionate extubation to comfort   Prognosis: Limited hours to days  Discharge Planning: Discharge will be [place] will  Vitals:   11/27/20 1123 11/27/20 1200  BP:  (!) 97/58  Pulse: 63 64  Resp: 18 18  Temp:    SpO2: 96% 95%    Intake/Output Summary (Last 24 hours) at 11/27/2020 1530 Last data filed at 11/27/2020 1000 Gross per 24 hour  Intake 4125.89 ml  Output 2643 ml  Net 1482.89 ml   Last Weight  Most recent update: 11/27/2020 12:02 AM    Weight  100.7 kg (222 lb 0.1 oz)            Gen: Middle-age man in no acute distress HEENT: Dry mucous membranes  CV: Regular rate and rhythm PULM: Extubated to room air ABD: hypoactive bowel sounds, enterocutaneous fistulae with pouches in place  EXT: Generalized full body edema Neuro: Somnolent  PPS: 10%   This conversation/these recommendations were discussed with patient primary care team, Dr. Meier  Time In: 1430 Time Out: 1540 Total Time: 70 Greater than 50%  of this time was spent counseling and coordinating care related to the above assessment and plan.    [facility] Team Team Cell Phone: [phone] Please utilize secure chat with additional questions, if there is no response  within 30 minutes please call the above phone number  Palliative Medicine Team providers are available by phone from 7am to 7pm daily and can be reached through the team cell phone.  Should this patient require assistance outside of these hours, please call the patient's attending physician.   

## 2020-11-28 DIAGNOSIS — K8591 Acute pancreatitis with uninfected necrosis, unspecified: Secondary | ICD-10-CM | POA: Diagnosis not present

## 2020-11-28 LAB — AEROBIC/ANAEROBIC CULTURE W GRAM STAIN (SURGICAL/DEEP WOUND)

## 2020-11-28 MED ORDER — LORAZEPAM 2 MG/ML IJ SOLN
0.5000 mg/h | INTRAVENOUS | Status: DC
  Administered 2020-11-28: 0.5 mg/h via INTRAVENOUS
  Administered 2020-11-29: 1.5 mg/h via INTRAVENOUS
  Filled 2020-11-28 (×2): qty 25

## 2020-11-28 MED ORDER — LORAZEPAM BOLUS VIA INFUSION
1.0000 mg | INTRAVENOUS | Status: DC | PRN
  Administered 2020-11-29: 1 mg via INTRAVENOUS
  Filled 2020-11-28: qty 1

## 2020-11-28 NOTE — Progress Notes (Signed)
Noted transition to comfort care. I went by and saw the patient and he was resting comfortably. General surgery will sign off at this time. Please call us back as needed.   Hosie Spangle, PA-C General & Trauma Surgery  [facility] Please see Amion for pager number during day hours 7:00am-4:30pm

## 2020-11-28 NOTE — Progress Notes (Signed)
ID Brief Note  Chart reviewed, patient has been transitioned to comfort care per notes ID will sign off for now Please call with questions.   Odette Fraction, MD Infectious Disease Physician [facility] Infectious Disease [address] [place], [place] [address] Phone: [phone]  Fax: [phone]

## 2020-11-28 NOTE — Progress Notes (Signed)
NAME:  Paul Chambers, MRN:  [id number], DOB:  02/22/1965, LOS: 38 ADMISSION DATE:  10/25/2020, CONSULTATION DATE:  11/14/2020 REFERRING MD:  Dr. Tereasa Coop, Reason for consult: Acute respiratory failure  History of Present Illness:  Patient was encephalopathic and/or intubated. Therefore history has been obtained from chart review.   Paul Chambers, is a 55 y.o. male, who was admitted to [facility] on 11/03/2020 with abdominal pain.  With a pertinent pmx of alcoholism, HTN, gout, pancreatitis, tobacco use disorder.  He was found to have pneumoperitoneum without clear perforation. He was started on vanc and zosyn. He underwent ex lap on 9/9 and was found to have a large amount of necrotic fluid surrounding his pancreas, which was concerning for necrotic infected pancreas, two drains were placed. No bowel perforation was noted.  His hospital course has been complicated by acute respiratory failure secondary to possible aspiration pneumonia, acute blood loss anemia requiring transfusion, delirium, severe malnutrition requiring TPN. Was weaned of TPN and TF was started. He continued to spike fevers and was tachycardic despite antibiotics. ID consulted on 9/24 and changed abx to IV zosyn. Repeat CT demonsdtated new perihepatic fluid. On 9/26 IR placed 3 new drains. Fluid cultures resulted with enterococcus and clostridium.  The early morning of 10/2 PCCM was called to bedside for O2 sats in the 70's with the patient not responding to noxious stimuli. The patient was emergently intubated. Shortly after the patient had a PEA arrest with 2 minutes of CPR until ROSC. 1 epi was given. The patient was transferred to ICU.  Pertinent  Medical History  alcoholism, HTN, gout, pancreatitis, tobacco abuse, cirrhosis   Significant Hospital Events: Including procedures, antibiotic start and stop dates in addition to other pertinent events   9/8 - Presented to [facility] with abdominal pain. CT A/P with extensive pneumoperitoneum 9/9 -  Ex-lap (no colonic perforation discovered, large necrotic fluid present surrounding pancreas. Transferred to ICU post-op, intubated and sedated.  9/9-9/15 Fluconazole, 9/9-9/15 linezolid, zosyn 9/9-9/15, meropenem 9/15-9/19, levofloxacin 9/19-9/24, flagyl 9/19-9/24, zosyn 9/24-9/27, unasyn 9/27-9/30, zosyn 9/30-present, vancomycin 10/2-10/3, linezolid 10/3, Eraxis 10/4 9/10-received 1 unit PRBC for hemoglobin 6.7, went back on Levophed for short period of time 9/12 - Extubated to Venturi mask 9/13 - Off Fentanyl and Propofol gtt 9/15 Transferred to [facility] 9/24 IV Zosyn 9/26 CT a/p showed perihepatic fluid, IR Drains placed 10/2 PCCM consulted for respiratory failure, intubated, PEA arrest s/p intubation, 2 minutes CPR, Transferred to icu 10/4 CT A/P with colonic communication between transverse colon into mesocolon and necrotic collection, significant retroperitoneal collections 10/5 IR for exchange of drains. SBT x 1 hour the increased RR. Completed thiamine and folate. 10/6 : Levophed discontinued, TNA started 10/7 : Tracheal Aspirate + for stenotrophomonas Maltophilia>> ID suspect this is a colonization not treating, but will continue to monitor 10/8: CT A/P overall unchanged with nec pancreatitis, collections unchanged, bilateral pleural effusions, anasarca. Started on Dilaudid gtt for pain control. 10/10: hgb 6.6, transfusing 1uprbc, wbc 10.9 from 6.9 10/11: no acute events overnight. Dht changed to og for ileus. Anticipate OR time in am.  10/12: retroperitoneal debridement in OR  Interim History / Subjective:  After discussion with pt's wife, made comfort measures only. Palliative consulted, appreciate assistance with compassionate extubation.    Objective   Blood pressure 104/60, pulse 71, temperature 98 F (36.7 C), temperature source Oral, resp. rate 18, height 5\' 9"  (1.753 m), weight 100.7 kg, SpO2 (!) 87 %.    Vent Mode: PRVC FiO2 (%):  [60 %]  60 % Set Rate:  [18 bmp] 18 bmp Vt  Set:  [560 mL] 560 mL PEEP:  [8 cmH20] 8 cmH20 Plateau Pressure:  [25 cmH20] 25 cmH20   Intake/Output Summary (Last 24 hours) at 11/28/2020 0726 Last data filed at 11/28/2020 0700 Gross per 24 hour  Intake 2544.45 ml  Output 3675 ml  Net -1130.55 ml   Filed Weights   11/23/20 0500 11/26/20 0421 11/27/20 0000  Weight: 93.4 kg 93.7 kg 100.7 kg   Physical Exam: General: Chronically ill appearing, NAD Cardiovascular: RRR no murmur GI: +bowel sounds, enterocutaneous fistulae with pouches in place containing liquid stool Extremities: anasarcic, warm Neuro: does not waken to voice   Resolved Hospital Problem list   In hospital PEA arrest Transaminitis AKI Acute metabolic encephalopathy Septic Shock Diarrhea  Assessment & Plan:   # Acute hypoxic respiratory failure from volume overload and aspiration pneumonitis # Septic shock from necrotizing pancreatitis with intraabdominal abscess and enterocutaneous, colonic fistulae - continue dilaudid gtt and bolus prn - attempt to wean precedex for RASS 0 to -1  - versed prn anxiety  - bowel regimen - palliative consulted, appreciate assistance - if comfortable off of precedex transfer out of ICU later today    Best Practice (right click and "Reselect all SmartList Selections" daily)   Diet/type: NPO   DVT prophylaxis: not indicated GI prophylaxis: N/A Lines: yes and it is still needed Foley:  Yes, and it is no longer needed Code Status:  Partial Code Last date of multidisciplinary goals of care discussion:  10/15 - see brief progress note, now comfort measures only   Critical care time: The patient is critically ill with multiple organ systems failure and requires high complexity decision making for assessment and support, frequent evaluation and titration of therapies, application of advanced monitoring technologies and extensive interpretation of multiple databases.  Critical care time 38 mins. This represents my time  independent of the NPs time taking care of the pt. This is excluding procedures.    Laroy Apple Pulmonary/Critical Care  11/28/2020, 7:26 AM See Amion for pager If no response to pager, please call [phone] until 1900 After 1900 please call ELINK [phone]

## 2020-11-29 DIAGNOSIS — K8591 Acute pancreatitis with uninfected necrosis, unspecified: Secondary | ICD-10-CM | POA: Diagnosis not present

## 2020-11-29 LAB — CULTURE, BLOOD (ROUTINE X 2)
Culture: NO GROWTH
Culture: NO GROWTH
Special Requests: ADEQUATE
Special Requests: ADEQUATE

## 2020-11-29 LAB — FUNGUS CULTURE, BLOOD
Culture: NO GROWTH
Special Requests: ADEQUATE

## 2020-11-30 NOTE — Progress Notes (Signed)
Dilaudid drip prepared but not needed.  Dilaudid 50mg  in wasted in pharmacy Stericycle container.  , PharmD, BCPS 11/30/2020 12:02 AM

## 2020-12-01 DIAGNOSIS — J690 Pneumonitis due to inhalation of food and vomit: Secondary | ICD-10-CM

## 2020-12-02 LAB — SUSCEPTIBILITY, AER + ANAEROB

## 2020-12-02 LAB — SUSCEPTIBILITY RESULT

## 2020-12-09 LAB — AEROBIC/ANAEROBIC CULTURE W GRAM STAIN (SURGICAL/DEEP WOUND)

## 2020-12-14 NOTE — Progress Notes (Addendum)
   Palliative Medicine Inpatient Follow Up Note  Consulting Provider: Omar Person, MD   Reason for consult:   Palliative Care Consult Services Palliative Medicine Consult  Reason for Consult? anticipate high dose of analgesia and sedation    HPI:  Per intake H&P --> Paul Chambers, is a 55 y.o. male, who was admitted to [facility] on 2020-11-08 with abdominal pain. With a pertinent pmx of alcoholism, HTN, gout, pancreatitis, tobacco use disorder.   Palliative care has been asked to get involved in the setting of a very complicated prolonged hospitalization inclusive of necrotizing pancreatitis, acute hypoxemic respiratory failure, and septic shock.   Per discussion with intensivist patient's wife has opted to transition to comfort measures though patient's palliative sedation may require larger doses given patient's prior history of alcohol abuse syndrome and active pain/terminal delirium.  Today's Discussion (12/02/2020):  *Please note that this is a verbal dictation therefore any spelling or grammatical errors are due to the "Dragon Medical One" system interpretation.  Chart reviewed. I spoke to patients bedside RN, Paul Chambers who shares that Paul Chambers symptoms are for the most part under good control with the exception of turning.   Upon assessment this morning Paul Chambers appeared very comfortable. He is resting and exemplifies no nonverbal signs of distress.   There is no family present at bedside.  Orders have been entered to transition to 6N.  Objective Assessment: Vital Signs Vitals:   12/11/2020 0600 11/15/2020 0630  BP:  127/67  Pulse: 100   Resp: 13   Temp:  98 F (36.7 C)  SpO2: (!) 88% 90%    Intake/Output Summary (Last 24 hours) at 12/01/2020 0856 Last data filed at 12/03/2020 0700 Gross per 24 hour  Intake 1073.49 ml  Output 1615 ml  Net -541.51 ml   Last Weight  Most recent update: 11/27/2020 12:02 AM    Weight  100.7 kg (222 lb 0.1 oz)            Gen: Middle-age  man in no acute distress HEENT: Dry mucous membranes  CV: Regular rate and rhythm PULM: On 2LPM  ABD: hypoactive bowel sounds, enterocutaneous fistulae with pouches in place  EXT: Generalized full body edema Neuro: Somnolent  SUMMARY OF RECOMMENDATIONS   DNAR/DNI   Comfort care   Comfort medications per MAR: Dilaudid drip, Ativan gtt, midazolam every 15 minutes as needed   Unrestricted visitation   Chaplain support offered and declined by patient's family   Ongoing palliative support during patient's end-of-life journey, I anticipate an in hospital death  Time Spent: 25 Greater than 50% of the time was spent in counseling and coordination of care ______________________________________________________________________________________ [name] Palliative Medicine Team Team Cell Phone: [phone] Please utilize secure chat with additional questions, if there is no response within 30 minutes please call the above phone number  Palliative Medicine Team providers are available by phone from 7am to 7pm daily and can be reached through the team cell phone.  Should this patient require assistance outside of these hours, please call the patient's attending physician.

## 2020-12-14 NOTE — Progress Notes (Signed)
NAME:  Paul Chambers, MRN:  [id number], DOB:  04-20-1965, LOS: 39 ADMISSION DATE:  10-22-20, CONSULTATION DATE:  11/14/2020 REFERRING MD:  Dr. Tereasa Coop, Reason for consult: Acute respiratory failure  History of Present Illness:  Patient was encephalopathic and/or intubated. Therefore history has been obtained from chart review.   Paul Chambers, is a 55 y.o. male, who was admitted to [facility] on 10/22/2020 with abdominal pain.  With a pertinent pmx of alcoholism, HTN, gout, pancreatitis, tobacco use disorder.  He was found to have pneumoperitoneum without clear perforation. He was started on vanc and zosyn. He underwent ex lap on 9/9 and was found to have a large amount of necrotic fluid surrounding his pancreas, which was concerning for necrotic infected pancreas, two drains were placed. No bowel perforation was noted.  His hospital course has been complicated by acute respiratory failure secondary to possible aspiration pneumonia, acute blood loss anemia requiring transfusion, delirium, severe malnutrition requiring TPN. Was weaned of TPN and TF was started. He continued to spike fevers and was tachycardic despite antibiotics. ID consulted on 9/24 and changed abx to IV zosyn. Repeat CT demonsdtated new perihepatic fluid. On 9/26 IR placed 3 new drains. Fluid cultures resulted with enterococcus and clostridium.  The early morning of 10/2 PCCM was called to bedside for O2 sats in the 70's with the patient not responding to noxious stimuli. The patient was emergently intubated. Shortly after the patient had a PEA arrest with 2 minutes of CPR until ROSC. 1 epi was given. The patient was transferred to ICU.  Pertinent  Medical History  alcoholism, HTN, gout, pancreatitis, tobacco abuse, cirrhosis   Significant Hospital Events: Including procedures, antibiotic start and stop dates in addition to other pertinent events   9/8 - Presented to [facility] with abdominal pain. CT A/P with extensive pneumoperitoneum 9/9 -  Ex-lap (no colonic perforation discovered, large necrotic fluid present surrounding pancreas. Transferred to ICU post-op, intubated and sedated.  9/9-9/15 Fluconazole, 9/9-9/15 linezolid, zosyn 9/9-9/15, meropenem 9/15-9/19, levofloxacin 9/19-9/24, flagyl 9/19-9/24, zosyn 9/24-9/27, unasyn 9/27-9/30, zosyn 9/30-present, vancomycin 10/2-10/3, linezolid 10/3, Eraxis 10/4 9/10-received 1 unit PRBC for hemoglobin 6.7, went back on Levophed for short period of time 9/12 - Extubated to Venturi mask 9/13 - Off Fentanyl and Propofol gtt 9/15 Transferred to [facility] 9/24 IV Zosyn 9/26 CT a/p showed perihepatic fluid, IR Drains placed 10/2 PCCM consulted for respiratory failure, intubated, PEA arrest s/p intubation, 2 minutes CPR, Transferred to icu 10/4 CT A/P with colonic communication between transverse colon into mesocolon and necrotic collection, significant retroperitoneal collections 10/5 IR for exchange of drains. SBT x 1 hour the increased RR. Completed thiamine and folate. 10/6 : Levophed discontinued, TNA started 10/7 : Tracheal Aspirate + for stenotrophomonas Maltophilia>> ID suspect this is a colonization not treating, but will continue to monitor 10/8: CT A/P overall unchanged with nec pancreatitis, collections unchanged, bilateral pleural effusions, anasarca. Started on Dilaudid gtt for pain control. 10/10: hgb 6.6, transfusing 1uprbc, wbc 10.9 from 6.9 10/11: no acute events overnight. Dht changed to og for ileus. Anticipate OR time in am.  10/12: retroperitoneal debridement in OR 10/16: Family meeting held with palliative care team. Decision made to transition patient to comfort care. Extubated and weaned off precedex  Interim History / Subjective:  Transitioned to comfort care and extubated yesterday. This am appears comfortable on dilaudid and ativan gtt.   Objective   Blood pressure 127/67, pulse 100, temperature 98 F (36.7 C), temperature source Axillary, resp. rate 13, height 5'  9"  (1.753 m), weight 100.7 kg, SpO2 90 %.        Intake/Output Summary (Last 24 hours) at 2020/12/11 0746 Last data filed at Dec 11, 2020 0700 Gross per 24 hour  Intake 1141.72 ml  Output 1615 ml  Net -473.28 ml   Filed Weights   11/23/20 0500 11/26/20 0421 11/27/20 0000  Weight: 93.4 kg 93.7 kg 100.7 kg   Physical Exam: General: Chronically ill-appearing, no acute distress HENT: [place], AT, OP clear, MMM Eyes: EOMI, no scleral icterus Respiratory: Clear to auscultation bilaterally.  No crackles, wheezing or rales Cardiovascular: RRR, -M/R/G, no JVD GI: Hypoactive BS, firm, distended,  midline incision with dressing in place. Right drains x 2 and LUQ large bore drain to suction with purulent material Neuro: Sedated, grimaces with touch, does not open eyes to voice or touch GU: Foley in place  Resolved Hospital Problem list   In hospital PEA arrest Transaminitis AKI Acute metabolic encephalopathy Septic Shock Diarrhea  Assessment & Plan:   # Acute hypoxic respiratory failure from volume overload and aspiration pneumonitis # Septic shock from necrotizing pancreatitis with intraabdominal abscess and enterocutaneous, colonic fistulae - Appreciate palliative care team. Patient transitioned to comfort care 10/16 - continue dilaudid gtt and bolus prn - continue ativan gtt - continue and R-sided JP drains, LUQ drainage tube to suction for comfort - Transfer to palliative floor  Best Practice (right click and "Reselect all SmartList Selections" daily)   Diet/type: NPO   DVT prophylaxis: not indicated GI prophylaxis: N/A Lines: yes and it is still needed Foley:  Yes, and it is no longer needed Code Status:  Partial Code Last date of multidisciplinary goals of care discussion:  10/15 - see brief progress note, now comfort measures only  Mechele Collin, M.D. [facility] Pulmonary/Critical Care Medicine 12/11/20 7:53 AM   See Amion for personal pager For hours between 7 PM to 7 AM,  please call Elink for urgent questions

## 2020-12-14 NOTE — Progress Notes (Signed)
   2020-12-15 Jun 08, 2328  Attending Physican Contact  Attending Physician Notified Y  Attending Physician (First and Last Name) Chi Mechele Collin, MD  Post Mortem Checklist  Date of Death 12/15/2020  Time of Death 06/08/13  Pronounced By Freddy Jaksch, RN and Clarita Crane, RN  Next of kin notified Yes  Name of next of kin notified of death Paul Chambers  Contact Person's Relationship to Patient Spouse  Contact Person's Phone Number [phone]  [place] [address] [place] [address]  Was the patient a No Code Blue or a Limited Code Blue? No  Did the patient die unattended? No  Patient restrained? Not applicable  Height 5\' 9"  (1.753 m)  Weight 100.7 kg  [facility] (previously known as [facility])  Notification Date 12-15-2020  Notification Time 2340  [facility] Number 12/01/20  Is patient a potential donor? N  Autopsy  Autopsy requested by N/A  Patient and Hospital Property Returned  Patient is satisfied that all belongings have been returned? Not applicable  Notifications  Patient Placement notified that Post Mortem checklist is complete Yes  Patient Placement notified body transferred Other (Comment)  Other Notifications (Specify) will meet the funeral personel at the morgue  Medical Examiner  Is this a medical examiner's case? N  Funeral Home  Funeral home name/address/phone # [facility] not listed  Name/Address/Phone # of [facility] [place] home, [address] [place], [place] tel#[phone]  Planned location of pickup Other (Comment) (family wants funeral to pick up body)

## 2020-12-14 NOTE — Progress Notes (Addendum)
Referring Physician(s): Kirstie Mirza PA  Supervising Physician: Gilmer Mor  Patient Status:  [facility] - In-pt  Chief Complaint:  History of necrotizing pancreatitis, bowel perforation and sepsis s/p multiple intra abdominal abscess drains. IR placed a left mid hemi abdomen (LLQ) drain, a perihepatic abscess drain RUQ),  a RLQ abscess drain  and aspirated 5 ml of bloody fluid onto the left hemipelvis.on 9.26.22 by Dr. Odis Luster.  On 10.5.22 the RLQ drain was upsize to a 16 Fr and the LLQ abscess drain was upsized to a 20 Fr. Performed by Dr. Carmon Ginsberg. Mir. LLQ drain remoived and 26 Fr placed by surgery on 10.12.22   Subjective:  Unable to assess. Per note from general surgery dated 10.16.22 Patient placed on comfort care. Wife at bedside.  Allergies: Patient has no known allergies.  Medications: Prior to Admission medications   Medication Sig Start Date End Date Taking? Authorizing Provider  acetaminophen (TYLENOL) 500 MG tablet Take 500 mg by mouth every 6 (six) hours as needed for mild pain.   Yes [provider]  albuterol (VENTOLIN HFA) 108 (90 Base) MCG/ACT inhaler Inhale 1-2 puffs into the lungs every 6 (six) hours as needed for wheezing or shortness of breath.   Yes [provider]  allopurinol (ZYLOPRIM) 300 MG tablet Take 300 mg by mouth daily.   Yes [provider]  ALPRAZolam Prudy Feeler) 0.5 MG tablet Take 0.5 mg by mouth 3 (three) times daily as needed for anxiety.   Yes [provider]  amLODipine (NORVASC) 5 MG tablet Take 5 mg by mouth daily.   Yes [provider]  Fluticasone-Umeclidin-Vilant (TRELEGY ELLIPTA) 100-62.5-25 MCG/INH AEPB Inhale 1 puff into the lungs daily.   Yes [provider]  folic acid (FOLVITE) 1 MG tablet Take 1 mg by mouth daily.   Yes [provider]  furosemide (LASIX) 40 MG tablet Take 40 mg by mouth 2 (two) times daily.   Yes [provider]  Multiple Vitamins-Minerals (PRESERVISION AREDS  2+MULTI VIT PO) Take 2 tablets by mouth daily.   Yes [provider]  Omega-3 Krill Oil 500 MG CAPS Take 1,000 mg by mouth daily.   Yes [provider]  omeprazole (PRILOSEC) 40 MG capsule Take 40 mg by mouth daily.   Yes [provider]  oxyCODONE (OXY IR/ROXICODONE) 5 MG immediate release tablet Take 2.5-5 mg by mouth every 6 (six) hours as needed for severe pain.   Yes [provider]  thiamine (VITAMIN B-1) 100 MG tablet Take 100 mg by mouth daily.   Yes [provider]     Vital Signs: BP 127/67 (BP Location: Left Arm)   Pulse 100   Temp 98 F (36.7 C) (Axillary)   Resp 13   Ht 5\' 9"  (1.753 m)   Wt 222 lb 0.1 oz (100.7 kg)   SpO2 90%   BMI 32.78 kg/m   Physical Exam Constitutional:      Appearance: He is ill-appearing.  Cardiovascular:     Rate and Rhythm: Regular rhythm. Tachycardia present.  Abdominal:     Comments: Drain Location: RLQ Size: Fr size: 16 Fr Thal Quick  Date of placement:   9.26.22 upsized on 10.5.22 Currently to: Drain collection device: suction bulb 15  ml of purulent fluid noted   Drain Location: RUQ Size: Fr size: 14 Fr Date of placement: 9.26.22  Currently to: Drain collection device: gravity 10 ml of purulent fluid noted.   Skin:    General: Skin  is warm and dry.    Imaging: DG Chest Port 1 View  Result Date: 11/27/2020 CLINICAL DATA:  Acute respiratory failure EXAM: PORTABLE CHEST 1 VIEW COMPARISON:  Chest x-rays dated 11/22/2020 and 11/20/2020. FINDINGS: Tubes and lines appear stable in position. Heart size and mediastinal contours are stable. Continued hazy airspace opacities bilaterally, mid and lower lobe predominant, likely due to layering pleural effusions. Probable associated bibasilar atelectasis. No new lung findings. No pneumothorax is seen. IMPRESSION: Stable chest x-ray. Continued hazy airspace opacities bilaterally, mid and lower lobe predominant, likely due to layering pleural  effusions with associated bibasilar atelectasis. Electronically Signed   By: Bary Richard M.D.   On: 11/27/2020 06:21    Labs:  CBC: Recent Labs    12-07-20 0538 11/25/20 0408 11/25/20 1251 11/26/20 0415 11/27/20 0318  WBC 9.6 10.6*  --  10.5 10.9*  10.9*  HGB 7.3* 8.3* 9.2* 8.0* 7.7*  7.7*  HCT 23.8* 26.1* 27.0* 25.6* 25.2*  25.0*  PLT 142* 140*  --  147* 142*  143*    COAGS: Recent Labs    10/30/2020 2102 10/22/20 0357 10/22/20 0826 11/20/20 0310  INR 2.1* 2.1* 2.0* 1.3*    BMP: Recent Labs    11/23/20 1750 December 07, 2020 0538 11/25/20 0408 11/25/20 1251 11/26/20 0854 11/27/20 0318  NA 138 141  --  141 140 139  K 3.8 3.9 3.9 3.9 3.9 4.1  CL 102 101  --   --  105 107  CO2 31 31  --   --  28 26  GLUCOSE 174* 221*  --   --  242* 219*  BUN 16 18  --   --  22* 20  CALCIUM 9.4 9.9  --   --  9.8 9.5  CREATININE 0.61 0.65  --   --  0.73 0.71  GFRNONAA >60 >60  --   --  >60 >60    LIVER FUNCTION TESTS: Recent Labs    11/15/20 0455 11/18/20 0549 11/20/20 0310 11/27/20 0318  BILITOT 0.6 0.5 0.6 0.6  AST 19 20 16 21   ALT 15 19 15 13   ALKPHOS 73 82 70 113  PROT 6.0* 5.6* 5.5* 5.9*  ALBUMIN 1.7* <1.5* <1.5* <1.5*    Assessment and Plan:  Drain Location: RLQ Size: Fr size: 16 Fr Thal Quick  Date of placement:   9.26.22 upsized on 10.5.22 Currently to: Drain collection device: suction bulb 15  ml of purulent fluid noted   Drain Location: RUQ Size: Fr size: 14 Fr Date of placement: 9.26.22  Currently to: Drain collection device: gravity 10 ml of purulent fluid noted.  Output by Drain (mL) 11/27/20 0701 - 11/27/20 1900 11/27/20 1901 - 11/28/20 0700 11/28/20 0701 - 11/28/20 1900 11/28/20 1901 - 11/15/2020 0700 12/01/2020 0701 - 12/09/2020 1305  Closed System Drain 2 Lateral RUQ Other (Comment) 14 Fr.   10    Closed System Drain 3 Right Hip 16 Fr.   5    Closed System Drain 1 Left Abdomen Other (Comment) 36 Fr.  150 150 50     Interval imaging/drain  manipulation:  10.10.22 abd xray.  10.8.22 CT abd with contrast.   Current examination: Flushes/aspirates easily.  Insertion site unremarkable. Suture and stat lock in place. Dressed appropriately.   Plan: Continue TID flushes with 5 cc NS. Record output Q shift. Dressing changes QD or PRN if soiled.  Call IR APP or on call IR MD if difficulty flushing or sudden change in drain output.  Repeat  imaging/possible drain injection once output < 10 mL/QD (excluding flush material.)  Discharge planning: Per note from general surgery dated 10.16.22 Patient placed on comfort care. Patient to stable from IR perscpective s/p  intra abdominal abscess drain placement further plans per Palliative and Primary Team please call IR with questions or concerns.    Electronically Signed: Alene Mires, NP 12/14/2020, 1:05 PM   I spent a total of 15 Minutes at the patient's bedside AND on the patient's hospital floor or unit, greater than 50% of which was counseling/coordinating care for intra abdominal abscess drain placement X 3.

## 2020-12-14 NOTE — Death Summary Note (Signed)
DEATH SUMMARY   Patient Details  Name: Paul Chambers MRN: [id number] DOB: April 07, 1965  Admission/Discharge Information   Admit Date:  2020/11/19  Date of Death: Date of Death: Dec 28, 2020  Time of Death: Time of Death: 2213/06/26  Length of Stay: 06-26-2037  Referring Physician: Pcp, No   Reason(s) for Hospitalization  Pneumoperitoneum  Diagnoses  Preliminary cause of death: Septic shock secondary to necrotizing pancreatitis complicated by enterococcus and clostridium intra-abdominal abscess Secondary Diagnoses (including complications and co-morbidities):  Principal Problem:   Acute necrotizing pancreatitis Active Problems:   Toxic encephalopathy   Severe sepsis (HCC)   Hyponatremia   Hypoalbuminemia   Macrocytic anemia   Pressure injury of skin   Protein-calorie malnutrition, severe   Intra-abdominal abscess (HCC)   Acute respiratory failure with hypoxia (HCC)   Septic shock (HCC)   Cardiac arrest (HCC)   Abscess   Aspiration pneumonia [facility])   Brief Hospital Course (including significant findings, care, treatment, and services provided and events leading to death)  Rahmir Beever is a 55 y.o. year old male with history of alcoholism, HTN, gout, pancreatitis, tobacco use disorder who was admitted to [facility] on 19-Nov-2020 with abdominal pain. He was found to have pneumoperitoneum without clear perforation. He underwent ex lap on 9/9 which revealed necrotizing pancreatitis. His hospital course has been complicated by abdominal abscess + enterococcus and clostridium requiring drain placement, acute respiratory failure secondary to possible aspiration pneumonia, acute blood loss anemia requiring transfusion, delirium, severe malnutrition requiring TPN. On 10/2 he was transferred to ICU for hypoxemia and unresponsiveness requiring intubation followed by PEA arrest. Post-arrest, he did have cognitive improvement however remained intubated due to critical illness. He returned to the OR 10/12 for  retroperitoneal debridement. With his prolonged hospital course, family meeting was held and after discussions of goals of care, decision was made to transition patient to comfort care. He was compassionately extubated and expired on 12-28-20 at 2213-06-26.  Pertinent Labs and Studies  Significant Diagnostic Studies DG Abd 1 View  Result Date: 11/22/2020 CLINICAL DATA:  Orogastric tube placement. EXAM: ABDOMEN - 1 VIEW COMPARISON:  Film earlier today at 1036 hours FINDINGS: Stable positioning feeding tube which is partially visualized. A newly placed orogastric tube follows the exact course of the feeding tube with the tip located approximately at the level of the second portion of the duodenum. Visualized bowel gas is unremarkable. IMPRESSION: Orogastric tube follows the course of the feeding tube with the tip located in approximately the second portion of the duodenum. Electronically Signed   By: Irish Lack M.D.   On: 11/22/2020 16:44   CT CHEST W CONTRAST  Result Date: 11/13/2020 CLINICAL DATA:  Pneumonia. Evaluate for abdominal infection. History of necrotizing pancreatitis. EXAM: CT CHEST, ABDOMEN, AND PELVIS WITH CONTRAST TECHNIQUE: Multidetector CT imaging of the chest, abdomen and pelvis was performed following the standard protocol during bolus administration of intravenous contrast. CONTRAST:  OMNIPAQUE IOHEXOL 300 MG/ML  SOLN COMPARISON:  CT abdomen and pelvis 11/06/2020. FINDINGS: CT CHEST FINDINGS Cardiovascular: The heart is enlarged. There is no pericardial effusion. The aorta is normal in size. The main pulmonary artery is enlarged compatible with pulmonary artery hypertension. Right-sided central venous catheter tip ends at the caval atrial junction. Mediastinum/Nodes: Visualized thyroid gland is within normal limits. There are no enlarged mediastinal or hilar lymph nodes. Enteric tube is seen throughout nondilated esophagus. Lungs/Pleura: There are large bilateral pleural effusions,  left greater than right. There is compressive atelectasis of the bilateral lower lobes. There  is also some atelectasis of the bilateral upper lobes. The lungs are otherwise clear. Trachea and central airways appear patent. There is no pneumothorax. Musculoskeletal: No chest wall mass or suspicious bone lesions identified. CT ABDOMEN PELVIS FINDINGS Hepatobiliary: No focal liver lesions are identified. There is gallbladder wall edema, unchanged. The gallbladder is nondilated. There is no biliary ductal dilatation. Pancreas: Head and neck of the pancreas are within normal limits. 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 Extensive peripancreatic air-fluid collection is again noted extending into the mid abdomen tracking along the body and tail of the pancreas. Right-sided percutaneous drainage catheters have been removed in the interval. There is a new left-sided pigtail percutaneous catheter within the collection. Compared to the prior study this collection has not significantly changed in size. The largest areas near the pancreatic tail measuring 4.7 x 12.0 cm (previously 12.7 x 4.8 cm). Spleen: Normal in size without focal abnormality. Adrenals/Urinary Tract: Adrenal glands are unremarkable. Kidneys are normal, without renal calculi, focal lesion, or hydronephrosis. Bladder is unremarkable. Stomach/Bowel: There is no evidence for bowel obstruction. The appendix is not seen. There is sigmoid colon diverticulosis without evidence for diverticulitis. There is new wall thickening and inflammation involving the splenic flexure of the colon. This portion of the colon abuts the. Pancreatic air-fluid collection. If fistula would be difficult to exclude. There is also diffuse wall thickening of small bowel loops with associated mesenteric edema similar to the prior study. Gastrojejunostomy tube is present. Stomach is decompressed. Rectal catheter is present. Vascular/Lymphatic: Aorta and IVC  are normal in size. There are atherosclerotic calcifications of the aorta. Reproductive: Prostate is unremarkable. Other: Perihepatic air-fluid collection extending into the right paracolic gutter is unchanged in size and appearance. There is a new percutaneous pigtail catheter in the collection adjacent to the liver. Small amount of ascites is decreased, but there is stable diffuse omental edema. Air-fluid collection abutting the proximal stomach and left adrenal gland appears unchanged measuring 8.3 x 3.0 cm image 3/60. Bilateral lower retroperitoneal air-fluid collections abutting the iliopsoas muscles also appear unchanged. The largest is on the left measuring 4.9 x 5.3 cm image 3/99. Presacral edema is present. No new enhancing fluid collections are identified. Musculoskeletal: There is diffuse body wall edema similar to the prior study. No acute fractures. No focal osseous lesion. IMPRESSION: 1. Large bilateral pleural effusions. 2. Atelectasis of the bilateral lower lobes and upper lobes. 3. Cardiomegaly. Enlarged main pulmonary artery compatible with pulmonary artery hypertension. 4. Overall, air-fluid collections throughout the abdomen and pelvis have not significantly changed in size or distribution. Right-sided percutaneous catheters have been removed in the interval. There is a new left-sided pigtail catheter ending in the peripancreatic collection and a new right-sided pigtail catheter ending in perihepatic collection. 5. New wall thickening of the splenic flexure of the colon worrisome for reactive colitis. Note is made that this section of the colon abuts air-fluid collection near the pancreas in fistula would be difficult to exclude. 6. Diffuse wall thickening of small bowel with mesenteric edema worrisome for nonspecific enteritis. Gastrojejunostomy tube in place. 7. Stable gallbladder wall edema, possibly reactive. Correlate for acute cholecystitis. Electronically Signed   By: Darliss Cheney M.D.    On: 11/13/2020 21:58   CT ABDOMEN W CONTRAST  Result Date: 11/20/2020 CLINICAL DATA:  Acute pancreatitis. Acute abdominal pain. Left-sided drain. EXAM: CT ABDOMEN WITH CONTRAST TECHNIQUE: Multidetector CT imaging of the abdomen was performed using the standard protocol following bolus administration of  intravenous contrast. CONTRAST:  OMNIPAQUE IOHEXOL 350 MG/ML SOLN COMPARISON:  11/16/2020 CT abdomen/pelvis. FINDINGS: Lower chest: Small to moderate dependent right pleural effusion and moderate dependent left pleural effusion, increased. Complete bilateral lower lobe atelectasis and moderate lingular atelectasis, worsened. Tip of superior approach central venous catheter seen at the cavoatrial junction. Hepatobiliary: Normal liver size. Two scattered subcentimeter hypodense posterior right liver lesions, too small to characterize, not definitely changed. No new liver lesions. No radiopaque cholelithiasis. No definite gallbladder wall thickening. No biliary ductal dilatation. CBD diameter 6 mm, top-normal, unchanged. Pancreas: There is replacement of much of the distal pancreatic body and pancreatic tail with a gas containing thick walled fluid collection extending into the anterior paranephric left retroperitoneal space and into the transverse mesocolon, with percutaneous drain terminating within the left anterior paranephric portion of this collection, which measures up to 21.8 cm transverse x 5.3 cm AP (series 3/images 37-41), previously 22.6 x 5.3 cm, not appreciably changed. Similar gas containing thick walled peripancreatic collection in the pancreatic head extending into the anterior right paranephric retroperitoneal space with right sided percutaneous drain terminating within this right retroperitoneal collection, which measures 7.6 x 2.9 cm (series 3/image 52), previously 7.6 x 3.0 cm, not appreciably changed. Low left retroperitoneal 5.9 x 4.5 cm collection with internal gas and thick enhancing  wall intimately involved with the left iliopsoas muscle (series 3/image 61), incompletely visualized on this scan, previously 5.6 x 5.0 cm using similar measurement technique, not substantially changed. Spleen: Normal size. No mass. Adrenals/Urinary Tract: Normal adrenals. Normal kidneys with no hydronephrosis and no renal mass. Stomach/Bowel: Gastrojejunostomy tube terminates within the proximal jejunum in the left abdomen. Stomach is collapsed with mild reactive gastric body wall thickening, unchanged. Generalized mild dilatation of the visualized small and large bowel with scattered small bowel and large bowel fluid levels, compatible with adynamic ileus. No definite small or large bowel wall thickening. Vascular/Lymphatic: Atherosclerotic nonaneurysmal abdominal aorta. Patent portal, splenic, hepatic and renal veins. No pathologically enlarged lymph nodes in the abdomen. Other: Peripancreatic fluid collection with thick enhancing wall measuring up to 3.3 cm thickness (series 3/image 45), previously 3.4 cm, not substantially changed, with percutaneous pigtail drain terminating in the superior right perihepatic space, with a small amount of gas in the anterior perihepatic space. Mild to moderate anasarca. Musculoskeletal: No aggressive appearing focal osseous lesions. Mild thoracolumbar spondylosis. IMPRESSION: 1. CT abdomen findings are not substantially changed since 11/16/2020, with stable sequela of necrotizing pancreatitis including replacement of much of the distal pancreatic body and pancreatic tail with large gas-containing thick walled fluid collection extending into the anterior paranephric left retroperitoneal space and into the transverse mesocolon. Additional peripancreatic head and anterior right paranephric retroperitoneal gas containing fluid collection, gas containing perihepatic fluid collection and low left retroperitoneal gas containing fluid collection intimately involving the left iliopsoas  muscle are not substantially changed. Bilateral percutaneous pigtail drainage catheters remain well positioned within these collections. 2. Generalized mild dilatation of the visualized small and large bowel with scattered small bowel and large bowel fluid levels, compatible with adynamic ileus. 3. Small to moderate dependent right pleural effusion and moderate dependent left pleural effusion, increased. 4. Mild to moderate anasarca. 5. Aortic Atherosclerosis (ICD10-I70.0). Electronically Signed   By: Delbert Phenix M.D.   On: 11/20/2020 13:04   CT ABDOMEN PELVIS W CONTRAST  Result Date: 11/16/2020 CLINICAL DATA:  A 55 year old male with signs of sepsis and bowel perforation suspected. EXAM: CT ABDOMEN AND PELVIS WITH CONTRAST TECHNIQUE: Multidetector CT imaging  of the abdomen and pelvis was performed using the standard protocol following bolus administration of intravenous contrast. CONTRAST:  80mL OMNIPAQUE IOHEXOL 350 MG/ML SOLN COMPARISON:  Multiple prior studies most recent comparison from November 13, 2020. FINDINGS: Lower chest: Catheter for venous access terminates at the caval to atrial junction. The central pulmonary vasculature with engorgement of main pulmonary artery to 3.8 cm, partially visualized. Dense basilar consolidation and LEFT-sided effusion is similar to the recent CT of the chest, abdomen and pelvis as is a RIGHT effusion with RIGHT basilar airspace disease. Hepatobiliary: Perihepatic fluid diminished since previous imaging. Drain remains in place entering via RIGHT flank approach between ribs 8 and 9. No focal, suspicious hepatic lesion. Gallbladder is contracted. No gross biliary duct distension. Pancreas: Signs of pancreatic necrosis with large necrotic collection in the anterior pararenal space tracking into the transverse mesocolon where there is evidence of frank colonic communication best seen on sagittal image 108/7. A small amount of ingested contrast media is seen in the dependent  aspect of the necrotic collection in this location. This communicates with the perihepatic fluid as well and with necrotic debris in the LEFT retroperitoneum. A drain entering via LEFT flank approach terminates in the anterior pararenal space in the LEFT retroperitoneum. Collection in the LEFT retroperitoneum measuring 11.1 x 5.0 cm previously 12.0 x 4.7 cm. Collection in the transverse mesocolon 5.6 x 4.3 cm (image 50/3) previously 6.2 x 4.4 cm. Drain in the RIGHT anterior pararenal space is similar. Lower extension of LEFT flank fluid into the LEFT iliac fossa measures 4.9 x 4.2 cm as compared to 5.3 x 4.9 cm (image 69/3) Signs of diffuse peritoneal inflammation and omental stranding. Perienteric fluid tracking in the anterior abdomen. Tract extending through the abdominal wall on the RIGHT in the mid abdomen communicates with necrotic debris in the transverse mesocolon. Spleen: Unremarkable currently. Adrenals/Urinary Tract: Adrenal glands normal. Symmetric renal enhancement. No hydronephrosis. Collapsed urinary bladder. Stomach/Bowel: Feeding tube in the proximal jejunum beyond the ligament of Treitz. Signs of mesenteric inflammation. Bowel wall thickening throughout the small bowel in the abdomen. Colonic communication with walled-off necrosis in the retroperitoneum and pancreatic bed. Vascular/Lymphatic: Aortic atherosclerosis of the abdominal aorta without aneurysm. Smooth contour of the IVC. Narrowing of the splenic vein as it passes through the necrotic collection in the retroperitoneum. There is no gastrohepatic or hepatoduodenal ligament lymphadenopathy. No retroperitoneal or mesenteric lymphadenopathy. No pelvic sidewall lymphadenopathy. Reproductive: Unremarkable. Other: Diffuse inflammation throughout the abdomen. Ascites and interloop fluid with similar volume. Fluid about the liver is diminished in volume compared to the previous study, drain remaining in place, other surgical drains and collections  as outlined above. There is also stranding and fat necrosis as well as inflammation tracking into the RIGHT iliac fossa less than on the LEFT. Musculoskeletal: No acute bone finding. No destructive bone process. Spinal degenerative changes. IMPRESSION: Homero Fellers colonic communication along the transverse colon into the mesocolic portion of the necrotic collection with extensive debris and fluid throughout the retroperitoneum in the setting of necrotic pancreatitis. Sequela of necrotic pancreatitis extends into the RIGHT upper quadrant about the liver, below the LEFT hemidiaphragm and throughout the LEFT and RIGHT retroperitoneum into the pelvis on the LEFT greater than RIGHT. Numerous drains in place still with considerable debris in fluid in the abdomen though with diminished amount of fluid and gas along the liver when compared to previous imaging. Persistent basilar airspace disease and effusions LEFT worse than RIGHT. Consider the possibility of developing infection at the  LEFT lung base. Tract extending through the abdominal wall on the RIGHT in the mid abdomen communicates with necrotic debris in the transverse mesocolon. Signs of diffuse peritoneal inflammation and omental stranding. Bowel wall thickening throughout small bowel in the abdomen likely related to diffuse enteritis in the setting diffuse peritoneal inflammation. Findings of bowel communication with necrotic collection were called to the provider caring for the patient at the time of dictation. These results were called by telephone at the time of interpretation on 11/16/2020 at 2:23 pm to provider [facility] , who verbally acknowledged these results. Electronically Signed   By: Donzetta Kohut M.D.   On: 11/16/2020 14:24   CT ABDOMEN PELVIS W CONTRAST  Result Date: 11/13/2020 CLINICAL DATA:  Pneumonia. Evaluate for abdominal infection. History of necrotizing pancreatitis. EXAM: CT CHEST, ABDOMEN, AND PELVIS WITH CONTRAST TECHNIQUE:  Multidetector CT imaging of the chest, abdomen and pelvis was performed following the standard protocol during bolus administration of intravenous contrast. CONTRAST:  OMNIPAQUE IOHEXOL 300 MG/ML  SOLN COMPARISON:  CT abdomen and pelvis 11/06/2020. FINDINGS: CT CHEST FINDINGS Cardiovascular: The heart is enlarged. There is no pericardial effusion. The aorta is normal in size. The main pulmonary artery is enlarged compatible with pulmonary artery hypertension. Right-sided central venous catheter tip ends at the caval atrial junction. Mediastinum/Nodes: Visualized thyroid gland is within normal limits. There are no enlarged mediastinal or hilar lymph nodes. Enteric tube is seen throughout nondilated esophagus. Lungs/Pleura: There are large bilateral pleural effusions, left greater than right. There is compressive atelectasis of the bilateral lower lobes. There is also some atelectasis of the bilateral upper lobes. The lungs are otherwise clear. Trachea and central airways appear patent. There is no pneumothorax. Musculoskeletal: No chest wall mass or suspicious bone lesions identified. CT ABDOMEN PELVIS FINDINGS Hepatobiliary: No focal liver lesions are identified. There is gallbladder wall edema, unchanged. The gallbladder is nondilated. There is no biliary ductal dilatation. Pancreas: Head and neck of the pancreas are within normal limits. 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 Extensive peripancreatic air-fluid collection is again noted extending into the mid abdomen tracking along the body and tail of the pancreas. Right-sided percutaneous drainage catheters have been removed in the interval. There is a new left-sided pigtail percutaneous catheter within the collection. Compared to the prior study this collection has not significantly changed in size. The largest areas near the pancreatic tail measuring 4.7 x 12.0 cm (previously 12.7 x 4.8 cm). Spleen: Normal in size  without focal abnormality. Adrenals/Urinary Tract: Adrenal glands are unremarkable. Kidneys are normal, without renal calculi, focal lesion, or hydronephrosis. Bladder is unremarkable. Stomach/Bowel: There is no evidence for bowel obstruction. The appendix is not seen. There is sigmoid colon diverticulosis without evidence for diverticulitis. There is new wall thickening and inflammation involving the splenic flexure of the colon. This portion of the colon abuts the. Pancreatic air-fluid collection. If fistula would be difficult to exclude. There is also diffuse wall thickening of small bowel loops with associated mesenteric edema similar to the prior study. Gastrojejunostomy tube is present. Stomach is decompressed. Rectal catheter is present. Vascular/Lymphatic: Aorta and IVC are normal in size. There are atherosclerotic calcifications of the aorta. Reproductive: Prostate is unremarkable. Other: Perihepatic air-fluid collection extending into the right paracolic gutter is unchanged in size and appearance. There is a new percutaneous pigtail catheter in the collection adjacent to the liver. Small amount of ascites is decreased, but there is stable diffuse omental edema. Air-fluid collection  abutting the proximal stomach and left adrenal gland appears unchanged measuring 8.3 x 3.0 cm image 3/60. Bilateral lower retroperitoneal air-fluid collections abutting the iliopsoas muscles also appear unchanged. The largest is on the left measuring 4.9 x 5.3 cm image 3/99. Presacral edema is present. No new enhancing fluid collections are identified. Musculoskeletal: There is diffuse body wall edema similar to the prior study. No acute fractures. No focal osseous lesion. IMPRESSION: 1. Large bilateral pleural effusions. 2. Atelectasis of the bilateral lower lobes and upper lobes. 3. Cardiomegaly. Enlarged main pulmonary artery compatible with pulmonary artery hypertension. 4. Overall, air-fluid collections throughout the  abdomen and pelvis have not significantly changed in size or distribution. Right-sided percutaneous catheters have been removed in the interval. There is a new left-sided pigtail catheter ending in the peripancreatic collection and a new right-sided pigtail catheter ending in perihepatic collection. 5. New wall thickening of the splenic flexure of the colon worrisome for reactive colitis. Note is made that this section of the colon abuts air-fluid collection near the pancreas in fistula would be difficult to exclude. 6. Diffuse wall thickening of small bowel with mesenteric edema worrisome for nonspecific enteritis. Gastrojejunostomy tube in place. 7. Stable gallbladder wall edema, possibly reactive. Correlate for acute cholecystitis. Electronically Signed   By: Darliss Cheney M.D.   On: 11/13/2020 21:58   CT ABDOMEN PELVIS W CONTRAST  Result Date: 11/06/2020 CLINICAL DATA:  Abdominal abscess or infection. EXAM: CT ABDOMEN AND PELVIS WITH CONTRAST TECHNIQUE: Multidetector CT imaging of the abdomen and pelvis was performed using the standard protocol following bolus administration of intravenous contrast. CONTRAST:  80mL OMNIPAQUE IOHEXOL 300 MG/ML  SOLN COMPARISON:  October 29, 2020. FINDINGS: Lower chest: Bilateral pleural effusions are noted with adjacent atelectasis or infiltrates of both lower lobes. Hepatobiliary: No focal liver abnormality is seen. No gallstones, gallbladder wall thickening, or biliary dilatation. Pancreas: Unremarkable. No pancreatic ductal dilatation or surrounding inflammatory changes. Spleen: Normal in size without focal abnormality. Adrenals/Urinary Tract: Adrenal glands are unremarkable. Kidneys are normal, without renal calculi, focal lesion, or hydronephrosis. Bladder is unremarkable. Stomach/Bowel: Feeding tube is seen passing through the stomach in the duodenum with distal tip in expected position of proximal jejunum. Stomach is otherwise unremarkable. No abnormal bowel  dilatation or inflammation is noted. The appendix appears normal. Vascular/Lymphatic: No significant vascular findings are present. No enlarged abdominal or pelvic lymph nodes. Reproductive: Prostate is unremarkable. Other: Stable position of 2 surgical drains entering right lower quadrant, with 1 tip seen inferior to the liver and the other tip extending into the left upper quadrant inferior to proximal stomach. Grossly stable gas and fluid collections are seen in the retroperitoneum which are drain by these tubes. There is an increased amount of fluid noted around the liver with an increased amount of gas in its non dependent portion suggesting persistent bowel leak. Remains a large amount of fluid and gas in a collection in the left pericolic gutter. Musculoskeletal: No acute or significant osseous findings. IMPRESSION: Bilateral pleural effusions are again noted with adjacent atelectasis or infiltrates of both lower lobes. Stable position of 2 surgical drains entering right lower quadrant, with tip seen inferior to the liver and inferior to the proximal stomach in the left upper quadrant. Grossly stable gas and fluid collections are seen throughout the retroperitoneum which are drain by these tubes. Stable gas and fluid collection is noted in left pericolic gutter. Mildly increased amount of fluid is noted around the liver with an increased amount of air seen  in the non dependent portion concerning for persistent bowel leak. Distal tip of feeding tube appears to be in the proximal jejunum. Electronically Signed   By: Lupita Raider M.D.   On: 11/06/2020 18:58   IR Catheter Tube Change  Result Date: 11/17/2020 INDICATION: 55 year old gentleman with necrotizing pancreatitis, sepsis and bowel perforation status post placement of drainage catheters in the right upper quadrant, right lower quadrant, and left lower quadrants. Most recent CT from 11/16/2020 demonstrates persistent collections near the right and  left lower quadrant drains with increasing size of left retroperitoneal abscess. Interval improvement of right upper quadrant fluid collection. EXAM: 1. Exchange and upsize of right lower quadrant drain from 14 [place] to 23 [place]. 2. Exchange and up size of left lower quadrant drain from 14 [place] to 20 [place]. MEDICATIONS: The patient is currently admitted to the hospital and receiving intravenous antibiotics. The antibiotics were administered within an appropriate time frame prior to the initiation of the procedure. ANESTHESIA/SEDATION: Fentanyl 3 mcg IV; Versed 100 mg IV Moderate Sedation Time:  16 minutes The patient was continuously monitored during the procedure by the interventional radiology nurse under my direct supervision. COMPLICATIONS: None immediate. PROCEDURE: Informed written consent was obtained from the patient's wife after a thorough discussion of the procedural risks, benefits and alternatives. All questions were addressed. Maximal Sterile Barrier Technique was utilized including caps, mask, sterile gowns, sterile gloves, sterile drape, hand hygiene and skin antiseptic. A timeout was performed prior to the initiation of the procedure. Patient positioned supine on the procedure table. The external segment of the right and left lower quadrant drains and surrounding skin prepped and draped in usual fashion. Contrast administered through the right lower quadrant drain under fluoroscopy showed appropriate positioning within the collection. This 75 French drain was cut and removed over a guidewire and replaced with a 16 [place] Thal drain. Contrast administered through the new drain confirmed appropriate positioning within the collection. Contrast administered through the 14 French left lower quadrant drain showed appropriate positioning. This drain was cut and removed over a guidewire and replaced with a 20 [place] Thal drain. Approximately 70 mL of purulent appearing material was drained. Both drains  were connected to bulb suction, secured to skin with suture and covered with sterile dressing. IMPRESSION: 1. Right lower quadrant 14 [place] multipurpose pigtail drain exchange for 16 [place] Thal drain. 2. Left lower quadrant 14 [place] multipurpose pigtail drain exchanged for 20 [place] Thal drain. 3. These are the largest available drains in the IR department. 4. 70 mL purulent output immediately following upsized to 20 French left lower quadrant drain. 5. No significant increased output from the right lower quadrant drain following exchange. The persistent collections within the abdomen are most likely thick necrotic fat which is unlikely to drain from these catheters. Electronically Signed   By: Acquanetta Belling M.D.   On: 11/17/2020 14:38   IR Catheter Tube Change  Result Date: 11/17/2020 INDICATION: 55 year old gentleman with necrotizing pancreatitis, sepsis and bowel perforation status post placement of drainage catheters in the right upper quadrant, right lower quadrant, and left lower quadrants. Most recent CT from 11/16/2020 demonstrates persistent collections near the right and left lower quadrant drains with increasing size of left retroperitoneal abscess. Interval improvement of right upper quadrant fluid collection. EXAM: 1. Exchange and upsize of right lower quadrant drain from 14 [place] to 49 [place]. 2. Exchange and up size of left lower quadrant drain from 14 [place] to 20 [place]. MEDICATIONS: The patient is currently  admitted to the hospital and receiving intravenous antibiotics. The antibiotics were administered within an appropriate time frame prior to the initiation of the procedure. ANESTHESIA/SEDATION: Fentanyl 3 mcg IV; Versed 100 mg IV Moderate Sedation Time:  16 minutes The patient was continuously monitored during the procedure by the interventional radiology nurse under my direct supervision. COMPLICATIONS: None immediate. PROCEDURE: Informed written consent was obtained from the patient's  wife after a thorough discussion of the procedural risks, benefits and alternatives. All questions were addressed. Maximal Sterile Barrier Technique was utilized including caps, mask, sterile gowns, sterile gloves, sterile drape, hand hygiene and skin antiseptic. A timeout was performed prior to the initiation of the procedure. Patient positioned supine on the procedure table. The external segment of the right and left lower quadrant drains and surrounding skin prepped and draped in usual fashion. Contrast administered through the right lower quadrant drain under fluoroscopy showed appropriate positioning within the collection. This 60 French drain was cut and removed over a guidewire and replaced with a 16 [place] Thal drain. Contrast administered through the new drain confirmed appropriate positioning within the collection. Contrast administered through the 14 French left lower quadrant drain showed appropriate positioning. This drain was cut and removed over a guidewire and replaced with a 20 [place] Thal drain. Approximately 70 mL of purulent appearing material was drained. Both drains were connected to bulb suction, secured to skin with suture and covered with sterile dressing. IMPRESSION: 1. Right lower quadrant 14 [place] multipurpose pigtail drain exchange for 16 [place] Thal drain. 2. Left lower quadrant 14 [place] multipurpose pigtail drain exchanged for 20 [place] Thal drain. 3. These are the largest available drains in the IR department. 4. 70 mL purulent output immediately following upsized to 20 French left lower quadrant drain. 5. No significant increased output from the right lower quadrant drain following exchange. The persistent collections within the abdomen are most likely thick necrotic fat which is unlikely to drain from these catheters. Electronically Signed   By: Acquanetta Belling M.D.   On: 11/17/2020 14:38   CT ASPIRATION  Result Date: 11/08/2020 INDICATION: History of necrotizing pancreatitis,  now with multiple complex fluid collections within the abdomen and pelvis despite operative debridement and placement of two large bore surgical peripancreatic drainage catheters. Request made for CT-guided aspiration(s) versus drainage catheter(s) placement for infection source control purposes. EXAM: 1. CT-GUIDED PERCUTANEOUS DRAINAGE CATHETER PLACEMENT X3 2. CT-GUIDED ASPIRATION OF LEFT LOWER ABDOMINAL/PELVIC FLUID COLLECTION COMPARISON:  CT abdomen pelvis-11/06/2020; 10/29/2020; 10/16/2020 MEDICATIONS: The patient is currently admitted to the hospital and receiving intravenous antibiotics. The antibiotics were administered within an appropriate time frame prior to the initiation of the procedure. ANESTHESIA/SEDATION: Moderate (conscious) sedation was employed during this procedure. A total of Versed 1 mg and Fentanyl 25 mcg was administered intravenously. Moderate Sedation Time: 65 minutes. The patient's level of consciousness and vital signs were monitored continuously by radiology nursing throughout the procedure under my direct supervision. CONTRAST:  None COMPLICATIONS: None immediate. PROCEDURE: Informed written consent was obtained from the patient after a discussion of the risks, benefits and alternatives to treatment. The patient was placed supine on the CT gantry and a pre procedural CT was performed re-demonstrating the known abscess/fluid collection within the perihepatic space with dominant mixed air and fluid containing collection measuring at least 18.0 x 4.6 cm (image 20, series 2), the approximately 11.5 x 5.3 cm complex fluid collection within the left mid hemiabdomen (image 49, series 2), the approximately 9.4 x 2.6 cm complex fluid collection  within the midline of the right mid abdomen at the level of the mesenteric root (image 64, series 2 as well as the ill-defined approximately 3.2 x 2.6 cm fluid collection within the left hemipelvis (image 94, series 3). The procedure was planned. A  timeout was performed prior to the initiation of the procedure. The skin overlying the operative sites were prepped and draped in usual sterile fashion. The overlying soft tissues were anesthetized with 1% lidocaine with epinephrine. Each collection was targeted with an 18 gauge trocar needle. If purulent fluid was aspirated, then a short Amplatz wire was coiled within each collection. Ultimately each collection except the collection within the left hemipelvis was successfully cannulated with a short Amplatz wire allowing placement of 14 French drainage catheters at all locations. The collection within the left lower pelvis was not amenable to percutaneous drainage catheter placement secondary to inability to coil a wire within the collection however proximally 5 cc of bloody fluid was aspirated from this indeterminate fluid collection. The collection within the perihepatic space yielded 100 cc of blood tinged fluid. The collection within the right lower abdominal quadrant yielded 50 cc of purulent fluid while the collection within the left mid abdomen yielded 125 cc of purulent fluid. A representative aspirated sample from the collection with the left mid hemiabdomen was capped and sent to the laboratory for analysis All drainage catheters were flushed with a small amount of saline and all drainage catheters were connected to gravity bags and secured in place with interrupted sutures and StatLock devices. Dressings were applied. While uncomfortable lying supine on the CT gantry, the patient tolerated the procedure well without immediate postprocedural complication. IMPRESSION: 1. Successful CT-guided placement of a 14 French drainage catheter within the complex collection within the left mid hemiabdomen yielding 125 cc of purulent fluid. A representative sample of aspirated fluid from this collection was capped and sent to the laboratory for analysis. 2. Successful CT-guided placement of 14 French perihepatic  drainage catheter yielding 100 cc of blood tinged fluid. 3. Successful CT-guided placement of a 14 French drainage catheter within the complex fluid collection within the right lower abdomen at the level of the mesenteric root yielding 50 cc of purulent fluid. 4. Successful CT-guided aspiration of approximately 5 cc of blood tinged fluid from the indeterminate fluid collection within the left hemipelvis, not amenable to drainage catheter placement given inability to coil a wire within this poorly defined collection/hematoma. Electronically Signed   By: Simonne Come M.D.   On: 11/08/2020 14:42   DG Chest Port 1 View  Result Date: 11/27/2020 CLINICAL DATA:  Acute respiratory failure EXAM: PORTABLE CHEST 1 VIEW COMPARISON:  Chest x-rays dated 11/22/2020 and 11/20/2020. FINDINGS: Tubes and lines appear stable in position. Heart size and mediastinal contours are stable. Continued hazy airspace opacities bilaterally, mid and lower lobe predominant, likely due to layering pleural effusions. Probable associated bibasilar atelectasis. No new lung findings. No pneumothorax is seen. IMPRESSION: Stable chest x-ray. Continued hazy airspace opacities bilaterally, mid and lower lobe predominant, likely due to layering pleural effusions with associated bibasilar atelectasis. Electronically Signed   By: Bary Richard M.D.   On: 11/27/2020 06:21   DG CHEST PORT 1 VIEW  Result Date: 11/22/2020 CLINICAL DATA:  Pneumonia. EXAM: PORTABLE CHEST 1 VIEW COMPARISON:  11/20/2020 FINDINGS: An endotracheal tube remains in place terminating 3-3.5 cm above the carina. An enteric tube courses into the abdomen with tip not imaged. A right PICC terminates over the right atrium. Small to  moderate bilateral pleural effusions, hazy right basilar airspace opacity, in dense left basilar opacity are similar to the prior study. No pneumothorax is identified. IMPRESSION: Unchanged small to moderate bilateral pleural effusions and left greater than  right basilar consolidation or atelectasis. Electronically Signed   By: Sebastian Ache M.D.   On: 11/22/2020 11:11   DG CHEST PORT 1 VIEW  Result Date: 11/20/2020 CLINICAL DATA:  Respiratory distress EXAM: PORTABLE CHEST 1 VIEW COMPARISON:  11/16/2020 chest radiograph. FINDINGS: Endotracheal tube tip is 4.7 cm above the carina. Enteric tube enters the stomach with the tip not seen on this image. Right PICC terminates over the cavoatrial junction. Stable cardiomediastinal silhouette with normal heart size. No pneumothorax. Small bilateral pleural effusions, stable. No overt pulmonary edema. Dense left retrocardiac opacity, unchanged. Hazy right basilar lung opacity, unchanged. Pigtail drain tip overlies the peripheral right hemidiaphragm region, unchanged. IMPRESSION: 1. Well-positioned support structures. 2. Stable small bilateral pleural effusions. 3. Stable dense left retrocardiac and hazy right basilar lung opacities. Electronically Signed   By: Delbert Phenix M.D.   On: 11/20/2020 09:44   DG CHEST PORT 1 VIEW  Result Date: 11/16/2020 CLINICAL DATA:  Respiratory failure EXAM: PORTABLE CHEST 1 VIEW COMPARISON:  11/14/2020 FINDINGS: Endotracheal tube and feeding catheter are noted in satisfactory position. Cardiac shadow is stable. Persistent right basilar airspace opacity is noted although the right-sided effusion is decreased when compare with the prior exam secondary to drainage catheter. Small left-sided pleural effusion is noted. IMPRESSION: Improved aeration in the right lung base although persistent opacity is seen. Bilateral small effusions improved on the right. Electronically Signed   By: Alcide Clever M.D.   On: 11/16/2020 03:46   DG CHEST PORT 1 VIEW  Result Date: 11/14/2020 CLINICAL DATA:  Shortness of breath, recent cardiac arrest EXAM: PORTABLE CHEST 1 VIEW COMPARISON:  Film from the previous day. FINDINGS: Cardiac shadow is prominent but stable. Feeding catheter extends into the stomach.  Endotracheal tube is noted in satisfactory position. Right-sided PICC line is noted in satisfactory position. Known perihepatic drainage catheter is noted. Increasing right-sided pleural effusion is noted. Underlying atelectatic changes are likely present. Changes are similar to that seen on recent chest CT. Known left-sided effusion is not as well appreciated. No acute bony abnormality is noted. IMPRESSION: Increasing right-sided effusion with basilar atelectasis. The left-sided effusion is not as well appreciated on today's exam. Electronically Signed   By: Alcide Clever M.D.   On: 11/14/2020 02:15   DG Chest Port 1 View  Result Date: 11/13/2020 CLINICAL DATA:  Hypoxemia, dyspnea. EXAM: PORTABLE CHEST 1 VIEW COMPARISON:  11/06/2020 FINDINGS: Feeding tube is in place, tip beyond the gastroesophageal junction. Heart size is accentuated by technique. There are bilateral pleural effusions and bibasilar opacities which partially obscure the hemidiaphragms bilaterally. Increased airspace filling opacities. RIGHT-sided PICC line has been removed. RIGHT pigtail type catheter overlies the RIGHT lung base. IMPRESSION: Increased bilateral effusions and bibasilar opacities. Electronically Signed   By: Norva Pavlov M.D.   On: 11/13/2020 15:32   DG Chest Port 1 View  Result Date: 11/06/2020 CLINICAL DATA:  Dyspnea EXAM: PORTABLE CHEST 1 VIEW COMPARISON:  11/04/2020 FINDINGS: Nasoenteric feeding tube extends into the upper abdomen beyond the margin of the examination. Lung volumes are small. Small bilateral pleural effusions are present. Retrocardiac opacification is present representing atelectasis, infiltrate, or posteriorly layering pleural fluid in this location. Right upper extremity PICC line tip noted within the superior cavoatrial junction. Cardiac size is within normal limits.  No acute bone abnormality. IMPRESSION: Stable support lines and tubes. Pulmonary hypoinflation. Small bilateral pleural effusions.  Associated retrocardiac collapse and consolidation Electronically Signed   By: Helyn Numbers M.D.   On: 11/06/2020 03:48   DG CHEST PORT 1 VIEW  Result Date: 11/04/2020 CLINICAL DATA:  Hypoxia and altered mental status. EXAM: PORTABLE CHEST 1 VIEW COMPARISON:  October 31, 2020 FINDINGS: There is stable right-sided PICC line and nasogastric tube positioning. Mild, stable bibasilar atelectasis and/or infiltrate is seen. Small bilateral pleural effusions are noted, right greater than left. This is mildly increased in size on the right when compared to the prior study. No pneumothorax is identified the heart size and mediastinal contours are within normal limits. The visualized skeletal structures are unremarkable. IMPRESSION: 1. Mild, stable bibasilar atelectasis and/or infiltrate. 2. Small bilateral pleural effusions, right greater than left. This is mildly increased in size on the right when compared to the prior study. Electronically Signed   By: Aram Candela M.D.   On: 11/04/2020 15:21   DG CHEST PORT 1 VIEW  Result Date: 10/31/2020 CLINICAL DATA:  Hypoxia and sepsis. EXAM: PORTABLE CHEST 1 VIEW COMPARISON:  Chest x-ray 10/28/2020, chest x-ray 11/10/2020, CT chest 10/29/2020, CT chest 10/17/2020 FINDINGS: Right PICC with tip overlying the expected region of the superior cavoatrial junction. Enteric tube coursing below the hemidiaphragm with tip collimated off view. The heart and mediastinal contours are unchanged. Slightly more prominent mass-like 2.6 cm opacity within the right lower lobe consistent with known infection. Left lower lobe retrocardiac opacity again noted with air bronchograms. No pulmonary edema. Persistent, possibly decreased in size, small left pleural effusion. Trace right pleural effusion. No pneumothorax. No acute osseous abnormality. IMPRESSION: Bilateral lower lobe airspace opacities with associated bilateral trace to small volume pleural effusions, left greater than right.  Stable to possibly slightly improved left pleural effusion. Slightly increased right lower lobe airspace opacity. Followup PA and lateral chest X-ray is recommended in 3-4 weeks following therapy to ensure resolution and exclude underlying malignancy. Right PICC in Electronically Signed   By: Tish Frederickson M.D.   On: 10/31/2020 15:17   DG Abd Portable 1V  Result Date: 11/22/2020 CLINICAL DATA:  Ileus. EXAM: PORTABLE ABDOMEN - 1 VIEW COMPARISON:  CT abdomen 11/20/2020 FINDINGS: A feeding tube terminates in the left mid abdomen at the level of the proximal jejunum. Bilateral abdominal drainage catheters are present. Mild small bowel dilatation is similar to the prior CT. No acute osseous abnormality is seen. IMPRESSION: Unchanged mild small bowel dilatation which may reflect ileus. Electronically Signed   By: Sebastian Ache M.D.   On: 11/22/2020 11:16   DG Abd Portable 1V  Result Date: 11/02/2020 CLINICAL DATA:  Abdominal pain for several days EXAM: PORTABLE ABDOMEN - 1 VIEW COMPARISON:  Film from the previous day. FINDINGS: Feeding catheter is again noted in the proximal aspect of the jejunum. Scattered large and small bowel gas is noted. Surgical drains are again seen consistent with the known history. No new focal abnormality is noted. IMPRESSION: Stable appearance of the abdomen when compare with the previous day. Surgical drains remain in place. Electronically Signed   By: Alcide Clever M.D.   On: 11/02/2020 15:24   DG Abd Portable 1V  Result Date: 11/01/2020 CLINICAL DATA:  Check feeding catheter placement EXAM: PORTABLE ABDOMEN - 1 VIEW COMPARISON:  10/29/2020 FINDINGS: Feeding catheter is noted extending into the fourth portion of the duodenum/proximal jejunum. Surgical drains are noted in place IMPRESSION: Feeding catheter in  the distal duodenum/proximal jejunum. Electronically Signed   By: Alcide Clever M.D.   On: 11/01/2020 15:49   CT IMAGE GUIDED DRAINAGE BY PERCUTANEOUS CATHETER  Result  Date: 11/08/2020 INDICATION: History of necrotizing pancreatitis, now with multiple complex fluid collections within the abdomen and pelvis despite operative debridement and placement of two large bore surgical peripancreatic drainage catheters. Request made for CT-guided aspiration(s) versus drainage catheter(s) placement for infection source control purposes. EXAM: 1. CT-GUIDED PERCUTANEOUS DRAINAGE CATHETER PLACEMENT X3 2. CT-GUIDED ASPIRATION OF LEFT LOWER ABDOMINAL/PELVIC FLUID COLLECTION COMPARISON:  CT abdomen pelvis-11/06/2020; 10/29/2020; 10/24/2020 MEDICATIONS: The patient is currently admitted to the hospital and receiving intravenous antibiotics. The antibiotics were administered within an appropriate time frame prior to the initiation of the procedure. ANESTHESIA/SEDATION: Moderate (conscious) sedation was employed during this procedure. A total of Versed 1 mg and Fentanyl 25 mcg was administered intravenously. Moderate Sedation Time: 65 minutes. The patient's level of consciousness and vital signs were monitored continuously by radiology nursing throughout the procedure under my direct supervision. CONTRAST:  None COMPLICATIONS: None immediate. PROCEDURE: Informed written consent was obtained from the patient after a discussion of the risks, benefits and alternatives to treatment. The patient was placed supine on the CT gantry and a pre procedural CT was performed re-demonstrating the known abscess/fluid collection within the perihepatic space with dominant mixed air and fluid containing collection measuring at least 18.0 x 4.6 cm (image 20, series 2), the approximately 11.5 x 5.3 cm complex fluid collection within the left mid hemiabdomen (image 49, series 2), the approximately 9.4 x 2.6 cm complex fluid collection within the midline of the right mid abdomen at the level of the mesenteric root (image 64, series 2 as well as the ill-defined approximately 3.2 x 2.6 cm fluid collection within the left  hemipelvis (image 94, series 3). The procedure was planned. A timeout was performed prior to the initiation of the procedure. The skin overlying the operative sites were prepped and draped in usual sterile fashion. The overlying soft tissues were anesthetized with 1% lidocaine with epinephrine. Each collection was targeted with an 18 gauge trocar needle. If purulent fluid was aspirated, then a short Amplatz wire was coiled within each collection. Ultimately each collection except the collection within the left hemipelvis was successfully cannulated with a short Amplatz wire allowing placement of 14 French drainage catheters at all locations. The collection within the left lower pelvis was not amenable to percutaneous drainage catheter placement secondary to inability to coil a wire within the collection however proximally 5 cc of bloody fluid was aspirated from this indeterminate fluid collection. The collection within the perihepatic space yielded 100 cc of blood tinged fluid. The collection within the right lower abdominal quadrant yielded 50 cc of purulent fluid while the collection within the left mid abdomen yielded 125 cc of purulent fluid. A representative aspirated sample from the collection with the left mid hemiabdomen was capped and sent to the laboratory for analysis All drainage catheters were flushed with a small amount of saline and all drainage catheters were connected to gravity bags and secured in place with interrupted sutures and StatLock devices. Dressings were applied. While uncomfortable lying supine on the CT gantry, the patient tolerated the procedure well without immediate postprocedural complication. IMPRESSION: 1. Successful CT-guided placement of a 14 French drainage catheter within the complex collection within the left mid hemiabdomen yielding 125 cc of purulent fluid. A representative sample of aspirated fluid from this collection was capped and sent to the laboratory  for analysis. 2.  Successful CT-guided placement of 14 French perihepatic drainage catheter yielding 100 cc of blood tinged fluid. 3. Successful CT-guided placement of a 14 French drainage catheter within the complex fluid collection within the right lower abdomen at the level of the mesenteric root yielding 50 cc of purulent fluid. 4. Successful CT-guided aspiration of approximately 5 cc of blood tinged fluid from the indeterminate fluid collection within the left hemipelvis, not amenable to drainage catheter placement given inability to coil a wire within this poorly defined collection/hematoma. Electronically Signed   By: Simonne Come M.D.   On: 11/08/2020 14:42   CT IMAGE GUIDED DRAINAGE BY PERCUTANEOUS CATHETER  Result Date: 11/08/2020 INDICATION: History of necrotizing pancreatitis, now with multiple complex fluid collections within the abdomen and pelvis despite operative debridement and placement of two large bore surgical peripancreatic drainage catheters. Request made for CT-guided aspiration(s) versus drainage catheter(s) placement for infection source control purposes. EXAM: 1. CT-GUIDED PERCUTANEOUS DRAINAGE CATHETER PLACEMENT X3 2. CT-GUIDED ASPIRATION OF LEFT LOWER ABDOMINAL/PELVIC FLUID COLLECTION COMPARISON:  CT abdomen pelvis-11/06/2020; 10/29/2020; 10/26/2020 MEDICATIONS: The patient is currently admitted to the hospital and receiving intravenous antibiotics. The antibiotics were administered within an appropriate time frame prior to the initiation of the procedure. ANESTHESIA/SEDATION: Moderate (conscious) sedation was employed during this procedure. A total of Versed 1 mg and Fentanyl 25 mcg was administered intravenously. Moderate Sedation Time: 65 minutes. The patient's level of consciousness and vital signs were monitored continuously by radiology nursing throughout the procedure under my direct supervision. CONTRAST:  None COMPLICATIONS: None immediate. PROCEDURE: Informed written consent was obtained from  the patient after a discussion of the risks, benefits and alternatives to treatment. The patient was placed supine on the CT gantry and a pre procedural CT was performed re-demonstrating the known abscess/fluid collection within the perihepatic space with dominant mixed air and fluid containing collection measuring at least 18.0 x 4.6 cm (image 20, series 2), the approximately 11.5 x 5.3 cm complex fluid collection within the left mid hemiabdomen (image 49, series 2), the approximately 9.4 x 2.6 cm complex fluid collection within the midline of the right mid abdomen at the level of the mesenteric root (image 64, series 2 as well as the ill-defined approximately 3.2 x 2.6 cm fluid collection within the left hemipelvis (image 94, series 3). The procedure was planned. A timeout was performed prior to the initiation of the procedure. The skin overlying the operative sites were prepped and draped in usual sterile fashion. The overlying soft tissues were anesthetized with 1% lidocaine with epinephrine. Each collection was targeted with an 18 gauge trocar needle. If purulent fluid was aspirated, then a short Amplatz wire was coiled within each collection. Ultimately each collection except the collection within the left hemipelvis was successfully cannulated with a short Amplatz wire allowing placement of 14 French drainage catheters at all locations. The collection within the left lower pelvis was not amenable to percutaneous drainage catheter placement secondary to inability to coil a wire within the collection however proximally 5 cc of bloody fluid was aspirated from this indeterminate fluid collection. The collection within the perihepatic space yielded 100 cc of blood tinged fluid. The collection within the right lower abdominal quadrant yielded 50 cc of purulent fluid while the collection within the left mid abdomen yielded 125 cc of purulent fluid. A representative aspirated sample from the collection with the left  mid hemiabdomen was capped and sent to the laboratory for analysis All drainage catheters were flushed with  a small amount of saline and all drainage catheters were connected to gravity bags and secured in place with interrupted sutures and StatLock devices. Dressings were applied. While uncomfortable lying supine on the CT gantry, the patient tolerated the procedure well without immediate postprocedural complication. IMPRESSION: 1. Successful CT-guided placement of a 14 French drainage catheter within the complex collection within the left mid hemiabdomen yielding 125 cc of purulent fluid. A representative sample of aspirated fluid from this collection was capped and sent to the laboratory for analysis. 2. Successful CT-guided placement of 14 French perihepatic drainage catheter yielding 100 cc of blood tinged fluid. 3. Successful CT-guided placement of a 14 French drainage catheter within the complex fluid collection within the right lower abdomen at the level of the mesenteric root yielding 50 cc of purulent fluid. 4. Successful CT-guided aspiration of approximately 5 cc of blood tinged fluid from the indeterminate fluid collection within the left hemipelvis, not amenable to drainage catheter placement given inability to coil a wire within this poorly defined collection/hematoma. Electronically Signed   By: Simonne Come M.D.   On: 11/08/2020 14:42   CT IMAGE GUIDED DRAINAGE BY PERCUTANEOUS CATHETER  Result Date: 11/08/2020 INDICATION: History of necrotizing pancreatitis, now with multiple complex fluid collections within the abdomen and pelvis despite operative debridement and placement of two large bore surgical peripancreatic drainage catheters. Request made for CT-guided aspiration(s) versus drainage catheter(s) placement for infection source control purposes. EXAM: 1. CT-GUIDED PERCUTANEOUS DRAINAGE CATHETER PLACEMENT X3 2. CT-GUIDED ASPIRATION OF LEFT LOWER ABDOMINAL/PELVIC FLUID COLLECTION COMPARISON:   CT abdomen pelvis-11/06/2020; 10/29/2020; Nov 07, 2020 MEDICATIONS: The patient is currently admitted to the hospital and receiving intravenous antibiotics. The antibiotics were administered within an appropriate time frame prior to the initiation of the procedure. ANESTHESIA/SEDATION: Moderate (conscious) sedation was employed during this procedure. A total of Versed 1 mg and Fentanyl 25 mcg was administered intravenously. Moderate Sedation Time: 65 minutes. The patient's level of consciousness and vital signs were monitored continuously by radiology nursing throughout the procedure under my direct supervision. CONTRAST:  None COMPLICATIONS: None immediate. PROCEDURE: Informed written consent was obtained from the patient after a discussion of the risks, benefits and alternatives to treatment. The patient was placed supine on the CT gantry and a pre procedural CT was performed re-demonstrating the known abscess/fluid collection within the perihepatic space with dominant mixed air and fluid containing collection measuring at least 18.0 x 4.6 cm (image 20, series 2), the approximately 11.5 x 5.3 cm complex fluid collection within the left mid hemiabdomen (image 49, series 2), the approximately 9.4 x 2.6 cm complex fluid collection within the midline of the right mid abdomen at the level of the mesenteric root (image 64, series 2 as well as the ill-defined approximately 3.2 x 2.6 cm fluid collection within the left hemipelvis (image 94, series 3). The procedure was planned. A timeout was performed prior to the initiation of the procedure. The skin overlying the operative sites were prepped and draped in usual sterile fashion. The overlying soft tissues were anesthetized with 1% lidocaine with epinephrine. Each collection was targeted with an 18 gauge trocar needle. If purulent fluid was aspirated, then a short Amplatz wire was coiled within each collection. Ultimately each collection except the collection within the  left hemipelvis was successfully cannulated with a short Amplatz wire allowing placement of 14 French drainage catheters at all locations. The collection within the left lower pelvis was not amenable to percutaneous drainage catheter placement secondary to inability to coil a  wire within the collection however proximally 5 cc of bloody fluid was aspirated from this indeterminate fluid collection. The collection within the perihepatic space yielded 100 cc of blood tinged fluid. The collection within the right lower abdominal quadrant yielded 50 cc of purulent fluid while the collection within the left mid abdomen yielded 125 cc of purulent fluid. A representative aspirated sample from the collection with the left mid hemiabdomen was capped and sent to the laboratory for analysis All drainage catheters were flushed with a small amount of saline and all drainage catheters were connected to gravity bags and secured in place with interrupted sutures and StatLock devices. Dressings were applied. While uncomfortable lying supine on the CT gantry, the patient tolerated the procedure well without immediate postprocedural complication. IMPRESSION: 1. Successful CT-guided placement of a 14 French drainage catheter within the complex collection within the left mid hemiabdomen yielding 125 cc of purulent fluid. A representative sample of aspirated fluid from this collection was capped and sent to the laboratory for analysis. 2. Successful CT-guided placement of 14 French perihepatic drainage catheter yielding 100 cc of blood tinged fluid. 3. Successful CT-guided placement of a 14 French drainage catheter within the complex fluid collection within the right lower abdomen at the level of the mesenteric root yielding 50 cc of purulent fluid. 4. Successful CT-guided aspiration of approximately 5 cc of blood tinged fluid from the indeterminate fluid collection within the left hemipelvis, not amenable to drainage catheter placement  given inability to coil a wire within this poorly defined collection/hematoma. Electronically Signed   By: Simonne Come M.D.   On: 11/08/2020 14:42   VAS [place] LOWER EXTREMITY VENOUS (DVT)  Result Date: 11/05/2020  Lower Venous DVT Study Patient Name:  JAQUALYN JUDAY  Date of Exam:   11/05/2020 Medical Rec #: [id number]     Accession #:    [id number] Date of Birth: 01-09-1966    Patient Gender: M Patient Age:   66 years Exam Location:  [facility] Procedure:      VAS [place] LOWER EXTREMITY VENOUS (DVT) Referring Phys: MICHAEL MACZIS --------------------------------------------------------------------------------  Indications: Edema.  Comparison Study: no prior Performing Technologist: [place] Ponder RVS  Examination Guidelines: A complete evaluation includes B-mode imaging, spectral Doppler, color Doppler, and power Doppler as needed of all accessible portions of each vessel. Bilateral testing is considered an integral part of a complete examination. Limited examinations for reoccurring indications may be performed as noted. The reflux portion of the exam is performed with the patient in reverse Trendelenburg.  +---------+---------------+---------+-----------+----------+--------------+ RIGHT    CompressibilityPhasicitySpontaneityPropertiesThrombus Aging +---------+---------------+---------+-----------+----------+--------------+ CFV      Full           Yes      Yes                                 +---------+---------------+---------+-----------+----------+--------------+ SFJ      Full                                                        +---------+---------------+---------+-----------+----------+--------------+ FV Prox  Full                                                        +---------+---------------+---------+-----------+----------+--------------+  FV Mid   Full                                                         +---------+---------------+---------+-----------+----------+--------------+ FV DistalFull                                                        +---------+---------------+---------+-----------+----------+--------------+ PFV      Full                                                        +---------+---------------+---------+-----------+----------+--------------+ POP      Full           Yes      Yes                                 +---------+---------------+---------+-----------+----------+--------------+ PTV      Full                                                        +---------+---------------+---------+-----------+----------+--------------+ PERO     Full                                                        +---------+---------------+---------+-----------+----------+--------------+   +---------+---------------+---------+-----------+----------+--------------+ LEFT     CompressibilityPhasicitySpontaneityPropertiesThrombus Aging +---------+---------------+---------+-----------+----------+--------------+ CFV      Full           Yes      Yes                                 +---------+---------------+---------+-----------+----------+--------------+ SFJ      Full                                                        +---------+---------------+---------+-----------+----------+--------------+ FV Prox  Full                                                        +---------+---------------+---------+-----------+----------+--------------+ FV Mid   Full                                                        +---------+---------------+---------+-----------+----------+--------------+  FV DistalFull                                                        +---------+---------------+---------+-----------+----------+--------------+ PFV      Full                                                         +---------+---------------+---------+-----------+----------+--------------+ POP      Full           Yes      Yes                                 +---------+---------------+---------+-----------+----------+--------------+ PTV      Full                                                        +---------+---------------+---------+-----------+----------+--------------+ PERO     Full                                                        +---------+---------------+---------+-----------+----------+--------------+     Summary: BILATERAL: - No evidence of deep vein thrombosis seen in the lower extremities, bilaterally. -No evidence of popliteal cyst, bilaterally.   *See table(s) above for measurements and observations. Electronically signed by Lemar Livings MD on 11/05/2020 at 3:03:21 PM.    Final    [place] EKG SITE RITE  Result Date: 11/18/2020 If Site Rite image not attached, placement could not be confirmed due to current cardiac rhythm.  [place] EKG SITE RITE  Result Date: 11/13/2020 If Site Rite image not attached, placement could not be confirmed due to current cardiac rhythm.  US Abdomen Limited RUQ (LIVER/GB)  Result Date: 11/14/2020 CLINICAL DATA:  Abdominal distension. Recent exploratory laparotomy with pancreatic debridement. EXAM: ULTRASOUND ABDOMEN LIMITED RIGHT UPPER QUADRANT COMPARISON:  CT scan November 06, 2020 FINDINGS: Gallbladder: Poorly evaluated due to open incision and bandages. The gallbladder wall does appear thickened measuring up to 6.4 mm. Common bile duct: Diameter: 3.6 mm Liver: Diffuse increased echogenicity. Mildly nodular contour. No focal mass. Portal vein is patent on color Doppler imaging with normal direction of blood flow towards the liver. Other: A right pleural effusion is identified. Ascites is identified as well. IMPRESSION: 1. Limited study due to open wound and bandages. 2. The gallbladder in particular is poorly evaluated. There does appear to be some  gallbladder wall thickening measuring up to 6.4 mm. 3. No common bile duct dilatation. 4. Nodular contour to the liver raising the possibility of cirrhosis. 5. Right pleural effusion.  Ascites. Electronically Signed   By: Gerome Sam III M.D.   On: 11/14/2020 06:38    Microbiology Recent Results (from the past 240 hour(s))  Culture, Respiratory w Gram Stain     Status: None  Collection Time: 11/22/20 10:44 AM   Specimen: Tracheal Aspirate; Respiratory  Result Value Ref Range Status   Specimen Description TRACHEAL ASPIRATE  Final   Special Requests NONE  Final   Gram Stain   Final    NO SQUAMOUS EPITHELIAL CELLS SEEN FEW WBC SEEN MODERATE GRAM NEGATIVE RODS Performed at [facility], [address], [place], [place] [address]    Culture MODERATE STENOTROPHOMONAS MALTOPHILIA  Final   Report Status 12/16/20 FINAL  Final   Organism ID, Bacteria STENOTROPHOMONAS MALTOPHILIA  Final      Susceptibility   Stenotrophomonas maltophilia - MIC*    LEVOFLOXACIN 4 INTERMEDIATE Intermediate     TRIMETH/SULFA <=20 SENSITIVE Sensitive     * MODERATE STENOTROPHOMONAS MALTOPHILIA  Culture, blood (routine x 2)     Status: None   Collection Time: 11/22/20  3:05 PM   Specimen: BLOOD  Result Value Ref Range Status   Specimen Description BLOOD LEFT ANTECUBITAL  Final   Special Requests IN PEDIATRIC BOTTLE Blood Culture adequate volume  Final   Culture   Final    NO GROWTH 7 DAYS Performed at [facility], [address], [place], [place] [address]    Report Status 11/18/2020 FINAL  Final  Culture, blood (routine x 2)     Status: None   Collection Time: 11/22/20  3:09 PM   Specimen: BLOOD LEFT HAND  Result Value Ref Range Status   Specimen Description BLOOD LEFT HAND  Final   Special Requests IN PEDIATRIC BOTTLE Blood Culture adequate volume  Final   Culture   Final    NO GROWTH 7 DAYS Performed at [facility], [address], [place], [place] [address]    Report Status  12/12/2020 FINAL  Final  Fungus culture, blood     Status: None   Collection Time: 11/22/20  3:09 PM   Specimen: BLOOD LEFT HAND  Result Value Ref Range Status   Specimen Description BLOOD LEFT HAND  Final   Special Requests IN PEDIATRIC BOTTLE Blood Culture adequate volume  Final   Culture   Final    NO GROWTH 7 DAYS NO FUNGUS ISOLATED Performed at [facility], [address] [place], [place] [address]    Report Status 12/11/2020 FINAL  Final  Surgical PCR screen     Status: Abnormal   Collection Time: 16-Dec-2020 11:03 AM   Specimen: Nasal Mucosa; Nasal Swab  Result Value Ref Range Status   MRSA, PCR POSITIVE (A) NEGATIVE Final    Comment: RESULT CALLED TO, READ BACK BY AND VERIFIED WITH:  JUDY BURROUGHS December 16, 2020 1239 ADL     Staphylococcus aureus POSITIVE (A) NEGATIVE Final    Comment: (NOTE) The Xpert SA Assay (FDA approved for NASAL specimens in patients 48 years of age and older), is one component of a comprehensive surveillance program. It is not intended to diagnose infection nor to guide or monitor treatment. Performed at [facility], [address] [place], [place] [address]   Fungus Culture With Stain     Status: None (Preliminary result)   Collection Time: 12/16/2020 12:53 PM   Specimen: Soft Tissue, Other; Wound  Result Value Ref Range Status   Fungus Stain Final report  Final    Comment: (NOTE) Performed At: [facility] [address] [place], [place] [id number] Jolene Schimke MD XL:[id number]    Fungus (Mycology) Culture PENDING  Incomplete   Fungal Source TISSUE  Final    Comment:  PANCREATIC SWAB FOR CULTURE SPEC A Performed at [facility], [address], [place], [place] [address]   Aerobic/Anaerobic Culture w Gram Stain (surgical/deep wound)     Status: None   Collection Time: Nov 27, 2020 12:53 PM   Specimen: Soft Tissue, Other; Wound  Result Value Ref Range Status   Specimen Description TISSUE  Final   Special Requests  PANCREATIC SWAB FOR CULTURE SPEC A  Final   Gram Stain   Final    RARE WBC PRESENT, PREDOMINANTLY MONONUCLEAR MODERATE GRAM NEGATIVE RODS RARE GRAM POSITIVE COCCI RARE GRAM VARIABLE ROD Performed at [facility], [address], [place], [place] [address]    Culture   Final    RARE ENTEROCOCCUS FAECALIS SUSCEPTIBILITIES PERFORMED ON PREVIOUS CULTURE WITHIN THE LAST 5 DAYS. MIXED ANAEROBIC FLORA PRESENT.  CALL LAB IF FURTHER IID REQUIRED.    Report Status 11/28/2020 FINAL  Final  Fungus Culture Result     Status: None   Collection Time: 11-27-20 12:53 PM  Result Value Ref Range Status   Result 1 Comment  Final    Comment: (NOTE) KOH/Calcofluor preparation:  no fungus observed. Performed At: [facility] [address] [place], [place] [id number] Jolene Schimke MD WG:[id number]   Fungus Culture With Stain     Status: None (Preliminary result)   Collection Time: November 27, 2020 12:58 PM   Specimen: Soft Tissue, Other  Result Value Ref Range Status   Fungus Stain Final report  Final    Comment: (NOTE) Performed At: [facility] [address] [place], [place] [id number] Jolene Schimke MD MW:[id number]    Fungus (Mycology) Culture PENDING  Incomplete   Fungal Source TISSUE  Final    Comment: PANCREATIC NECROSIS SPEC B Performed at [facility], [address] [place], [place] [address]   Aerobic/Anaerobic Culture w Gram Stain (surgical/deep wound)     Status: None (Preliminary result)   Collection Time: 11/27/2020 12:58 PM   Specimen: Soft Tissue, Other  Result Value Ref Range Status   Specimen Description TISSUE  Final   Special Requests PANCREATIC NECROSIS SPEC B  Final   Gram Stain   Final    RARE WBC PRESENT, PREDOMINANTLY MONONUCLEAR FEW GRAM NEGATIVE RODS FEW GRAM POSITIVE COCCI IN PAIRS Performed at [facility], [address], [place], [place] [address]    Culture   Final    RARE ENTEROCOCCUS FAECALIS Sent to [facility] for further  susceptibility testing. MIXED ANAEROBIC FLORA PRESENT.  CALL LAB IF FURTHER IID REQUIRED.    Report Status PENDING  Incomplete  Fungus Culture Result     Status: None   Collection Time: 2020-11-27 12:58 PM  Result Value Ref Range Status   Result 1 Comment  Final    Comment: (NOTE) KOH/Calcofluor preparation:  no fungus observed. Performed At: [facility] [address] [place], [place] [id number] Jolene Schimke MD OV:[id number]     Lab Basic Metabolic Panel: Recent Labs  Lab 11/23/20 1750 11/27/20 0538 11/25/20 0408 11/25/20 1251 11/26/20 0854 11/27/20 0318  NA 138 141  --  141 140 139  K 3.8 3.9 3.9 3.9 3.9 4.1  CL 102 101  --   --  105 107  CO2 31 31  --   --  28 26  GLUCOSE 174* 221*  --   --  242* 219*  BUN 16 18  --   --  22* 20  CREATININE 0.61 0.65  --   --  0.73 0.71  CALCIUM 9.4 9.9  --   --  9.8 9.5  MG 1.8 1.9 2.3  --   --   --   PHOS  --  3.7 4.2  --   --   --    Liver Function Tests: Recent Labs  Lab 11/27/20 0318  AST 21  ALT 13  ALKPHOS 113  BILITOT 0.6  PROT 5.9*  ALBUMIN <1.5*   No results for input(s): LIPASE, AMYLASE in the last 168 hours. Recent Labs  Lab 11/26/20 0855  AMMONIA 37*   CBC: Recent Labs  Lab 12/10/20 0538 11/25/20 0408 11/25/20 1251 11/26/20 0415 11/27/20 0318  WBC 9.6 10.6*  --  10.5 10.9*  10.9*  NEUTROABS  --   --   --   --  8.4*  HGB 7.3* 8.3* 9.2* 8.0* 7.7*  7.7*  HCT 23.8* 26.1* 27.0* 25.6* 25.2*  25.0*  MCV 102.1* 99.2  --  98.5 100.8*  99.6  PLT 142* 140*  --  147* 142*  143*   Cardiac Enzymes: No results for input(s): CKTOTAL, CKMB, CKMBINDEX, TROPONINI in the last 168 hours. Sepsis Labs: Recent Labs  Lab 10-Dec-2020 0538 11/25/20 0408 11/26/20 0415 11/27/20 0318  WBC 9.6 10.6* 10.5 10.9*  10.9*    Procedures/Operations  9/9 Intubation, ex-lap 9/26 IR drain placement 10/2 Intubation 10/12 Retroperitoneal debridement   Candise Crabtree Mechele Collin 11/30/2020, 6:59 AM

## 2020-12-14 NOTE — Progress Notes (Signed)
Wasted 38ml of dilaudid and 59ml of Ativan drip into stericycle container, with Clarita Crane, [employer] fundraiser as witness.

## 2020-12-14 NOTE — Progress Notes (Signed)
Received  patient from ICU, patient is a comfort care. Drains intact to right and left abdomen. On ativan and dilaudid drip. Patient resting comfortbaly right now. Will continue to monitor.

## 2020-12-14 NOTE — Progress Notes (Deleted)
   Dec 09, 2020 Jun 02, 2328  Attending Physican Contact  Attending Physician Notified Y  Attending Physician (First and Last Name) Chi Mechele Collin, MD  Post Mortem Checklist  Date of Death Dec 09, 2020  Time of Death 06/02/2013  Pronounced By Freddy Jaksch, RN and Clarita Crane, RN  Next of kin notified Yes  Name of next of kin notified of death Toluwani Yadav  Contact Person's Relationship to Patient Spouse  Contact Person's Phone Number [phone]  [place] [address] [place], [address]  Was the patient a No Code Blue or a Limited Code Blue? No  Did the patient die unattended? No  Patient restrained? Not applicable  Height 5\' 9"  (1.753 m)  Weight 100.7 kg  [facility] (previously known [facility])  Notification Date 09-Dec-2020  Notification Time 2340  [facility] Number 12/[id number]/20  Is patient a potential donor? N  Autopsy  Autopsy requested by N/A  Patient and Hospital Property Returned  Patient is satisfied that all belongings have been returned? Not applicable  Notifications  Patient Placement notified that Post Mortem checklist is complete Yes  Patient Placement notified body transferred Other (Comment)  Other Notifications (Specify) will meet the funeral personel at the morgue  Medical Examiner  Is this a medical examiner's case? [facility] [place]  Funeral home name/address/phone # [facility] not listed  Name/Address/Phone # of [facility] [place], [place] tel#[phone]  Planned location of pickup Other (Comment) (family wants funeral to pick up body)

## 2020-12-14 DEATH — deceased

## 2020-12-22 LAB — FUNGUS CULTURE WITH STAIN

## 2020-12-22 LAB — FUNGAL ORGANISM REFLEX

## 2020-12-22 LAB — FUNGUS CULTURE RESULT

## 2020-12-23 LAB — FUNGAL ORGANISM REFLEX

## 2020-12-23 LAB — FUNGUS CULTURE RESULT

## 2020-12-23 LAB — FUNGUS CULTURE WITH STAIN

## 2022-09-30 IMAGING — CT CT HEAD W/O CM
4 series · 16 of 47 positions shown, 18 images · non-contrast
Comparison: None.

CLINICAL DATA: Altered mental status.

EXAM:
CT HEAD WITHOUT CONTRAST
TECHNIQUE: Contiguous axial images were obtained from the base of the skull
through the vertex without intravenous contrast.

[Series 3: head bone · axial · 0.46mm/px · z∈[+1410,+1442]mm · 3 of 79 slices shown]
[im 8/79  bone]
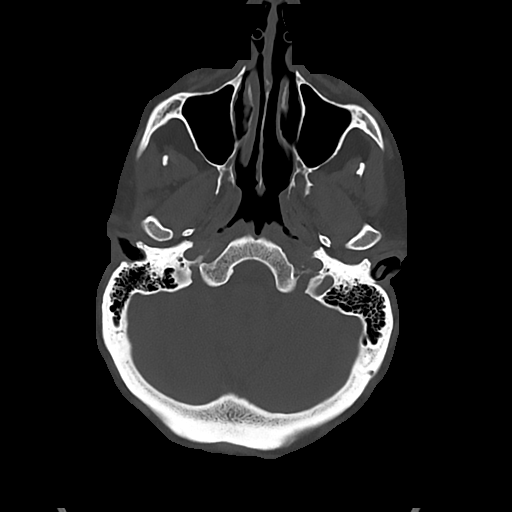
[im 16/79  bone]
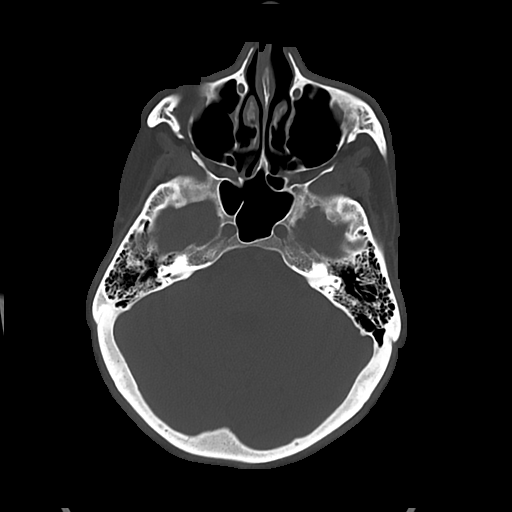
[im 24/79  bone]
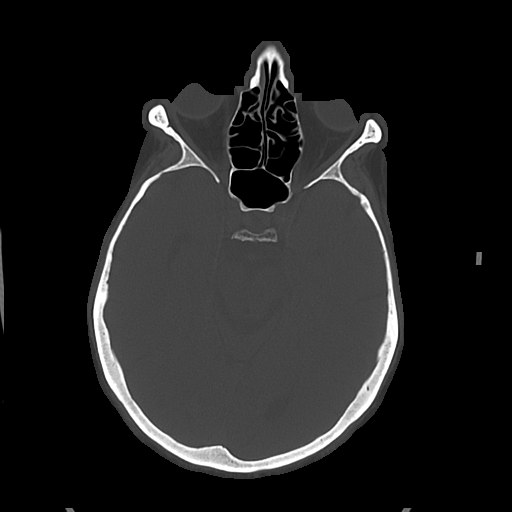

[Series 4: head wo · axial · 0.46mm/px · z∈[+1411,+1531]mm · 7 of 32 slices shown, 9 images]
[im 4/32  brain]
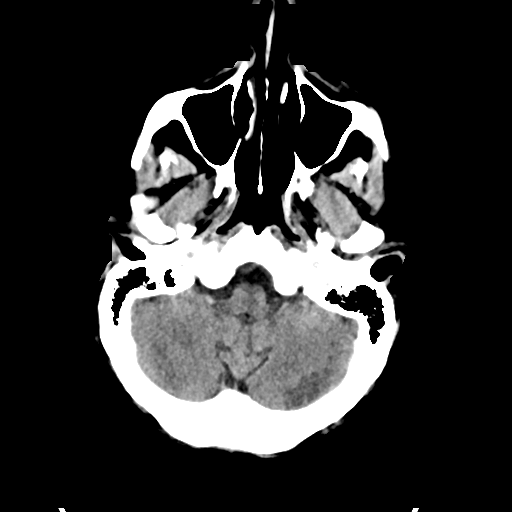
[im 4/32  bone]
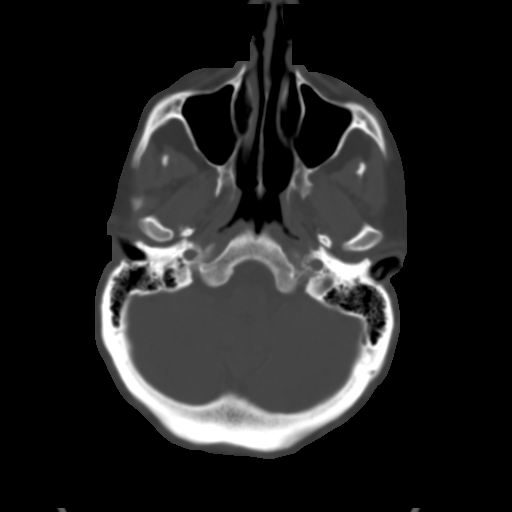
[im 8/32  brain]
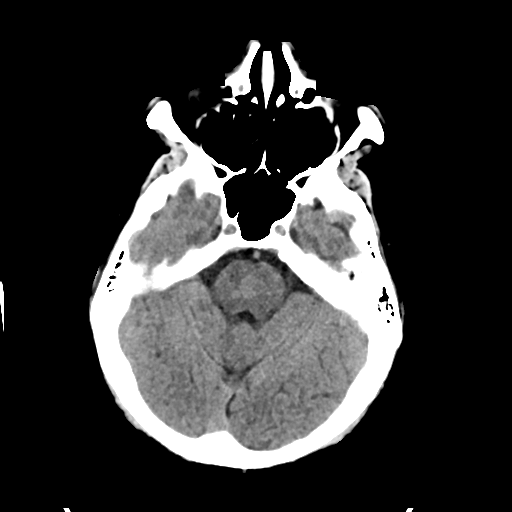
[im 12/32  brain]
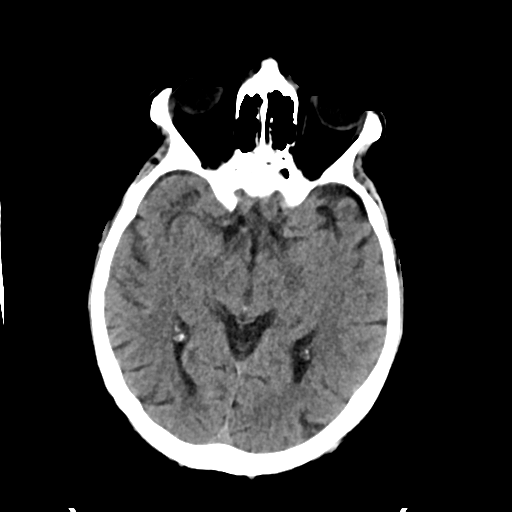
[im 16/32  brain]
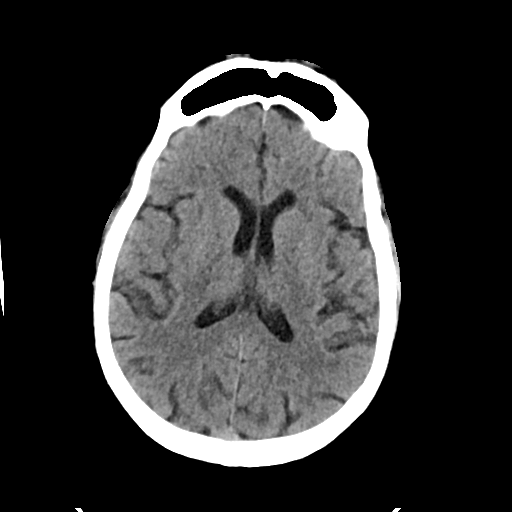
[im 20/32  brain]
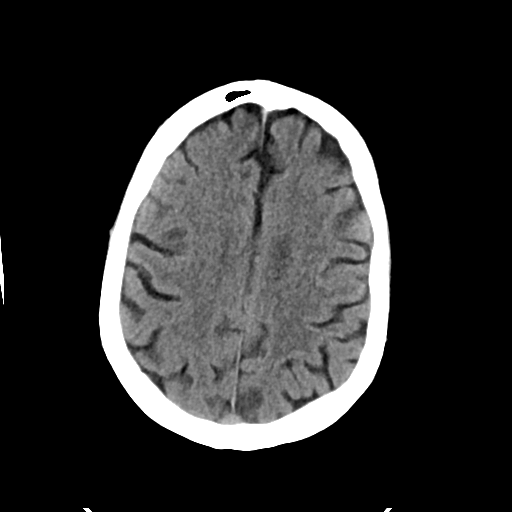
[im 20/32  bone]
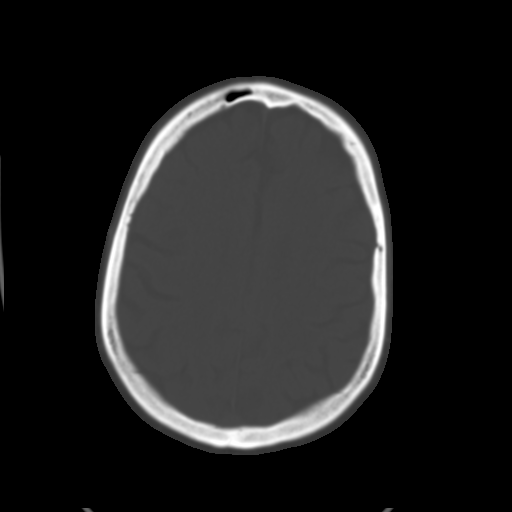
[im 24/32  brain]
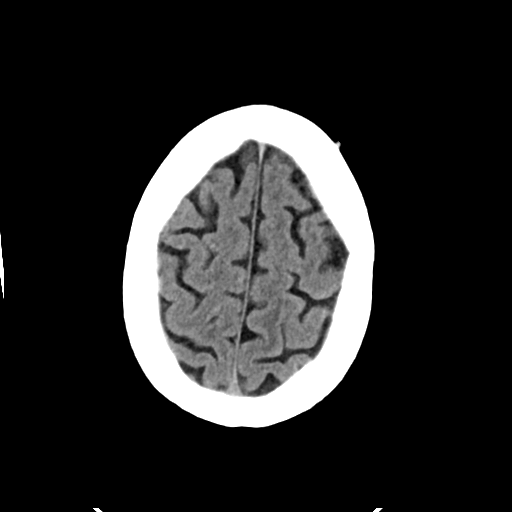
[im 28/32  brain]
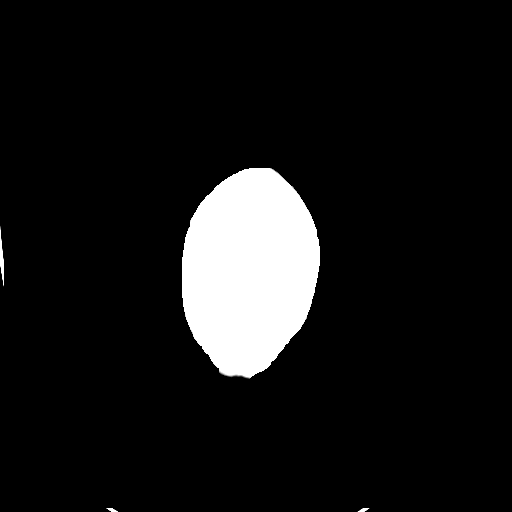

[Series 5: cor soft · coronal · 0.31mm/px · 3 of 75 slices shown]
[im 25/75  brain]
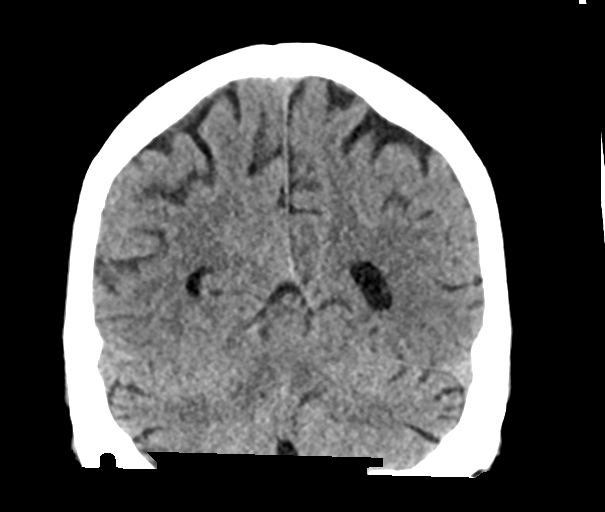
[im 33/75  brain]
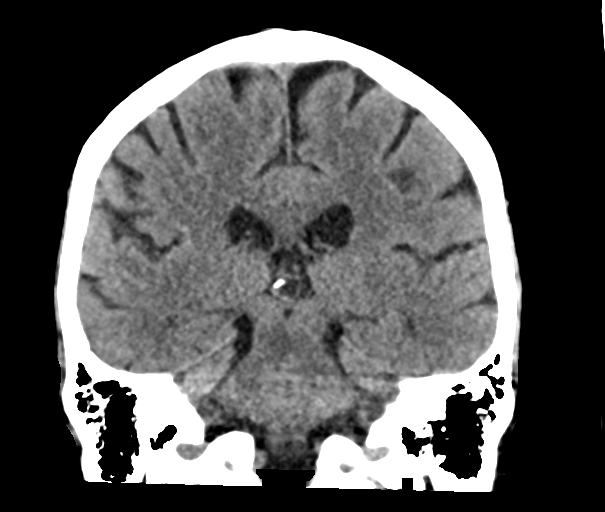
[im 42/75  brain]
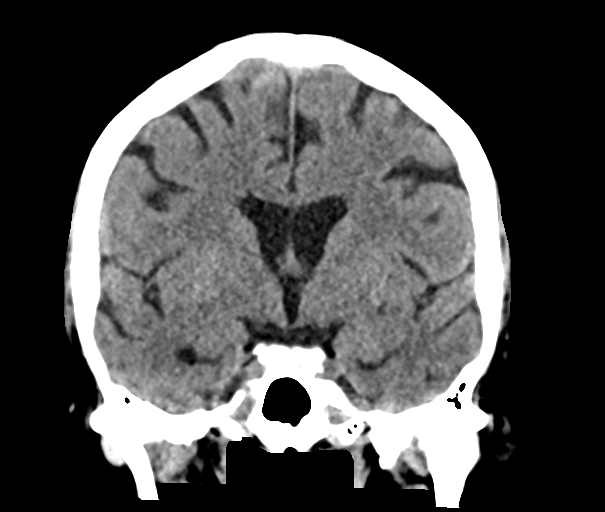

[Series 6: sag soft · sagittal · 0.31mm/px · 3 of 63 slices shown]
[im 21/63  brain]
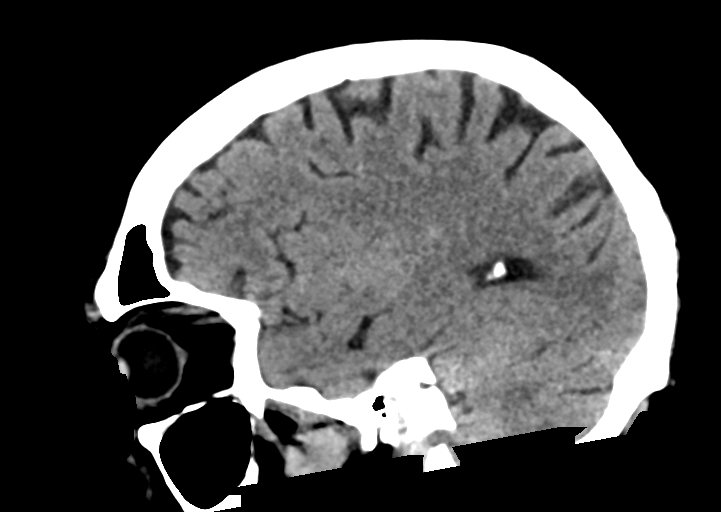
[im 32/63  brain]
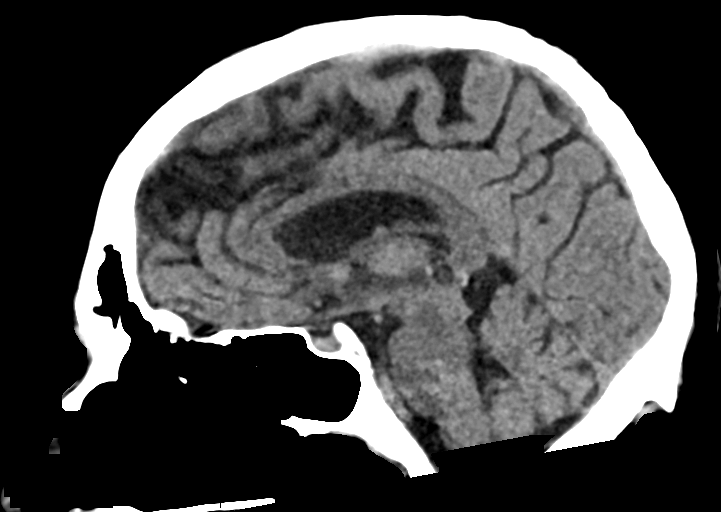
[im 42/63  brain]
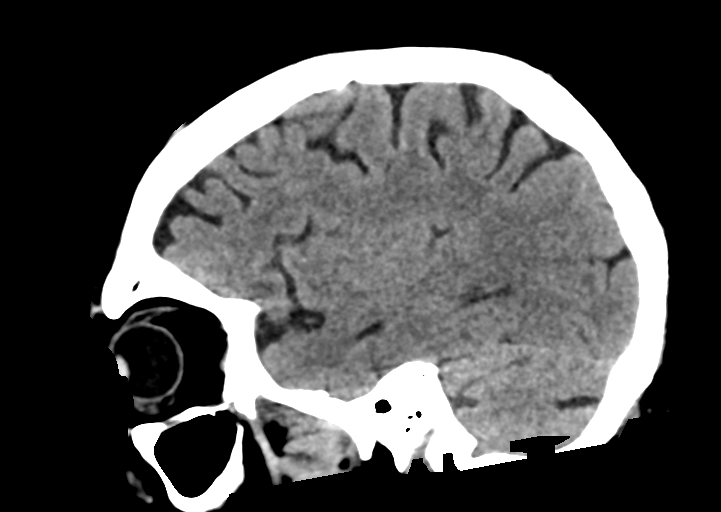

[16 of 47 positions shown; findings below may reference images not displayed]

FINDINGS: Brain: No evidence of acute infarction, hemorrhage, hydrocephalus,
extra-axial collection or mass lesion/mass effect.

Vascular: No hyperdense vessel or unexpected calcification.

Skull: Normal. Negative for fracture or focal lesion.

Sinuses/Orbits: No acute finding.

Other: None.
IMPRESSION: No acute intracranial abnormality seen.

## 2022-09-30 IMAGING — CT CT ABD-PELV W/ CM
2 of 7 series · 13 of 46 positions shown, 15 images · IV contrast (omnipaque)
Comparison: None.

CLINICAL DATA: Abdominal abscess/infection suspected

EXAM:
CT ABDOMEN AND PELVIS WITH CONTRAST
TECHNIQUE: Multidetector CT imaging of the abdomen and pelvis was performed
using the standard protocol following bolus administration of
intravenous contrast.
CONTRAST:  100mL OMNIPAQUE IOHEXOL 350 MG/ML SOLN

[Series 6: abdomen 3.0 mpr cor · coronal · 0.83mm/px · 3 of 116 slices shown]
[im 39/116  soft-tissue]
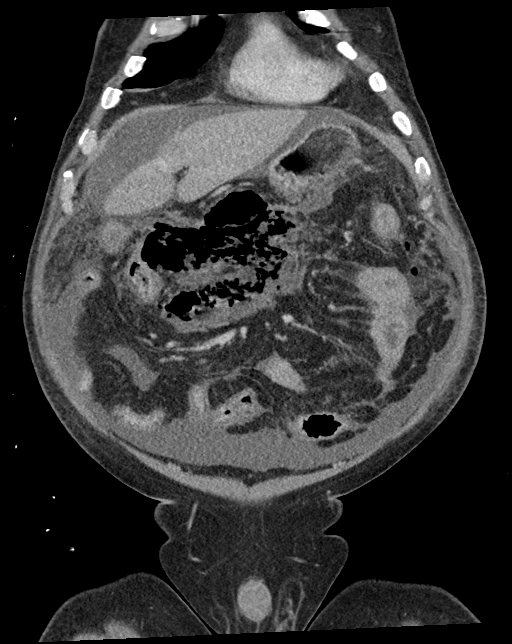
[im 52/116  soft-tissue]
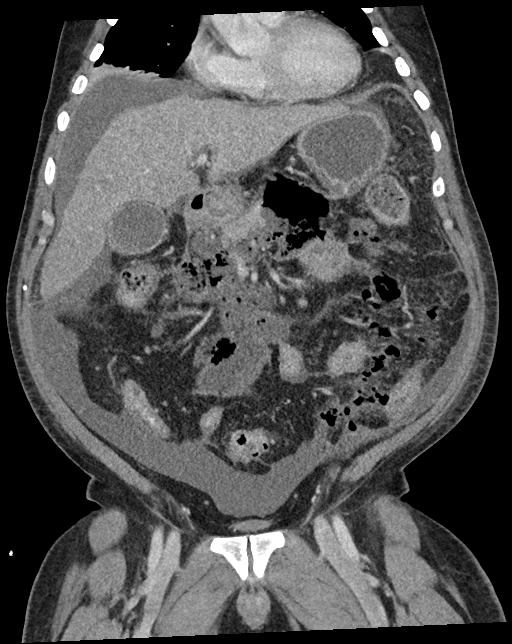
[im 64/116  soft-tissue]
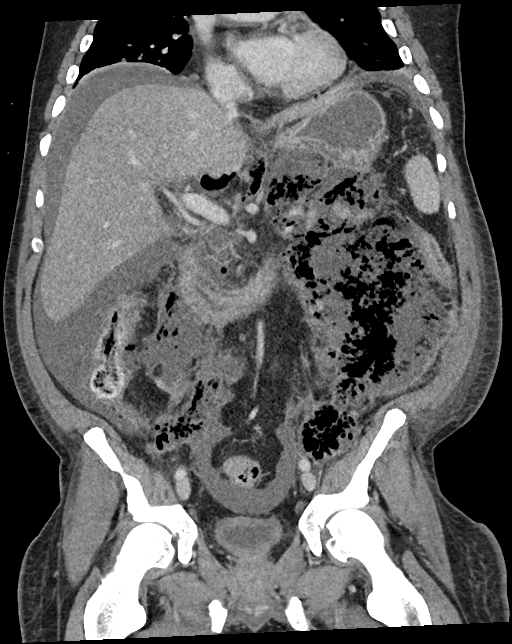

[Series 9: abdomen 3.0 mpr ax · axial · 0.75mm/px · z∈[+383,+812]mm · 10 of 170 slices shown, 12 images]
[im 14/170  soft-tissue]
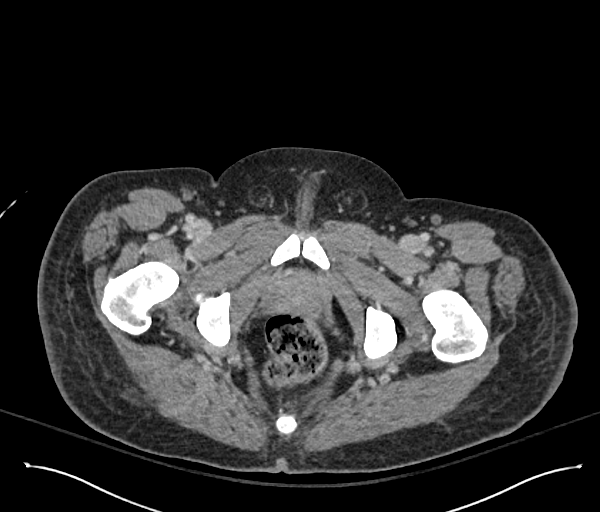
[im 14/170  bone]
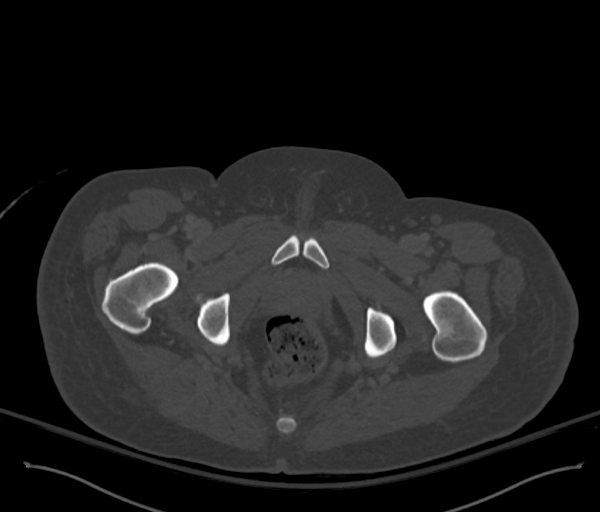
[im 27/170  soft-tissue]
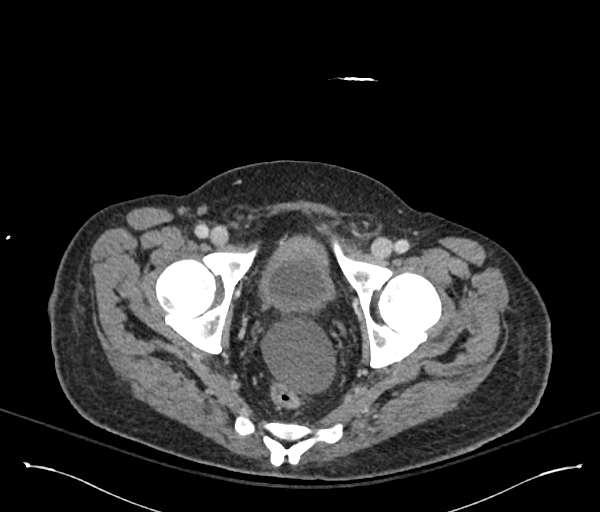
[im 53/170  soft-tissue]
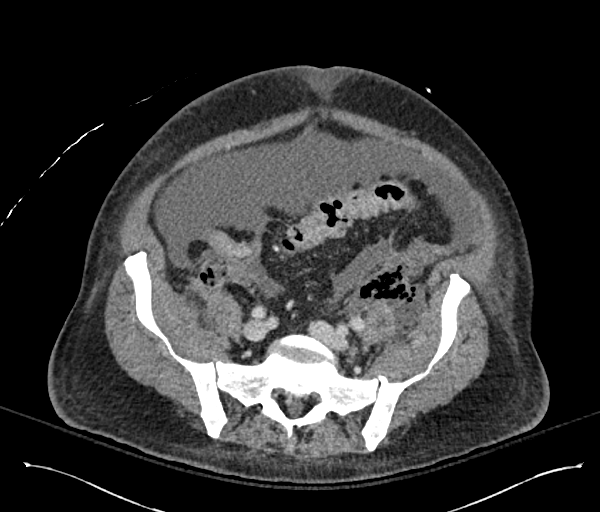
[im 66/170  soft-tissue]
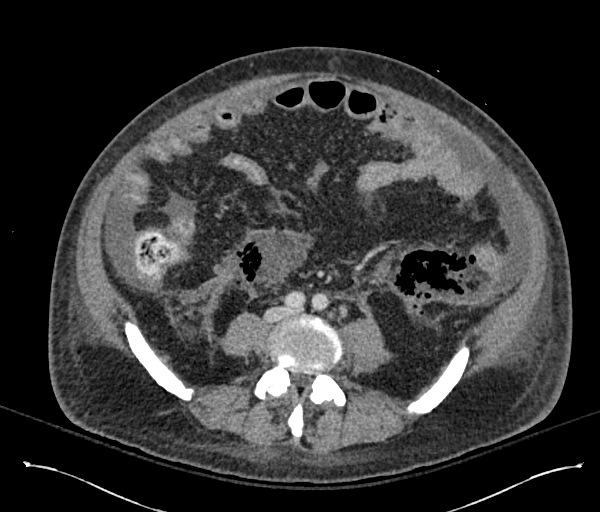
[im 79/170  soft-tissue]
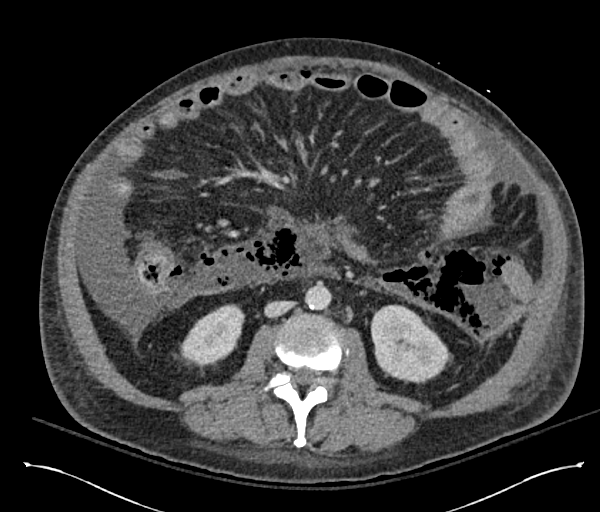
[im 92/170  soft-tissue]
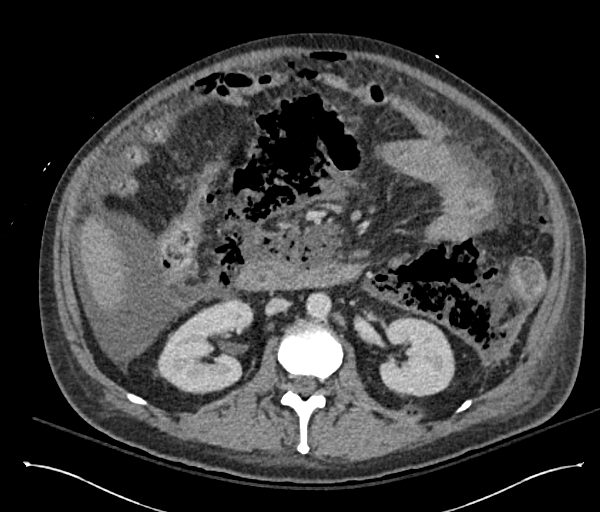
[im 105/170  soft-tissue]
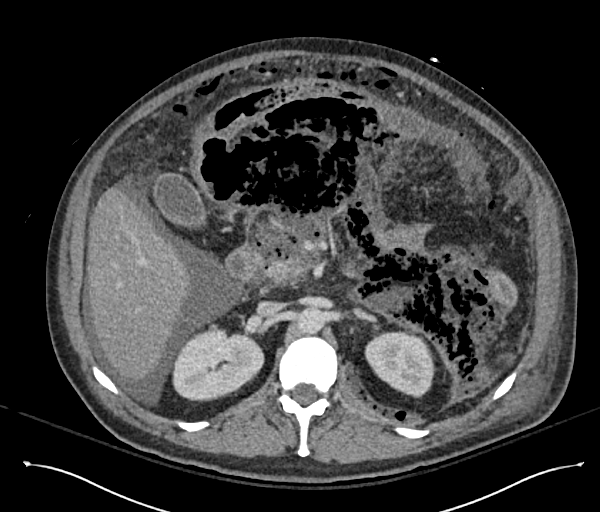
[im 131/170  soft-tissue]
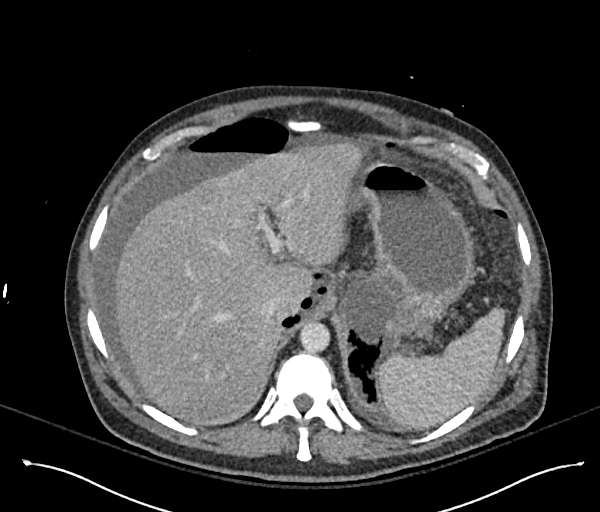
[im 144/170  soft-tissue]
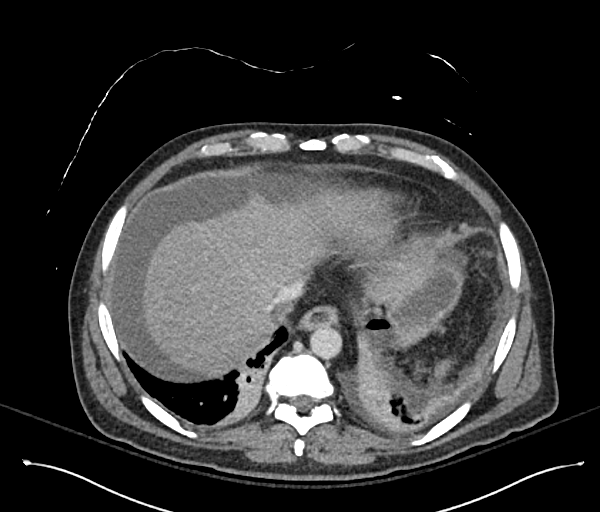
[im 144/170  bone]
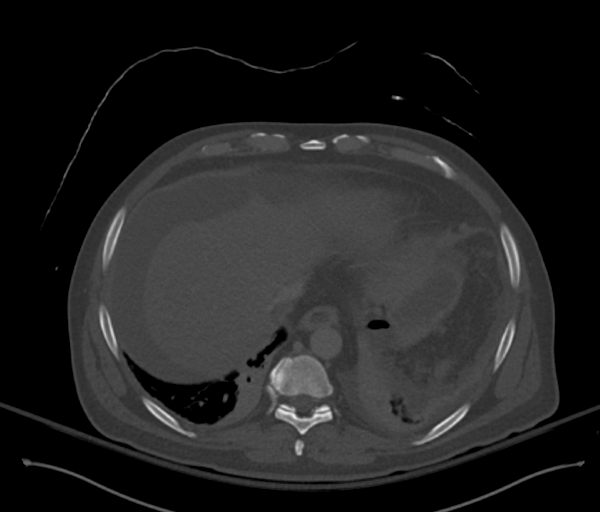
[im 157/170  soft-tissue]
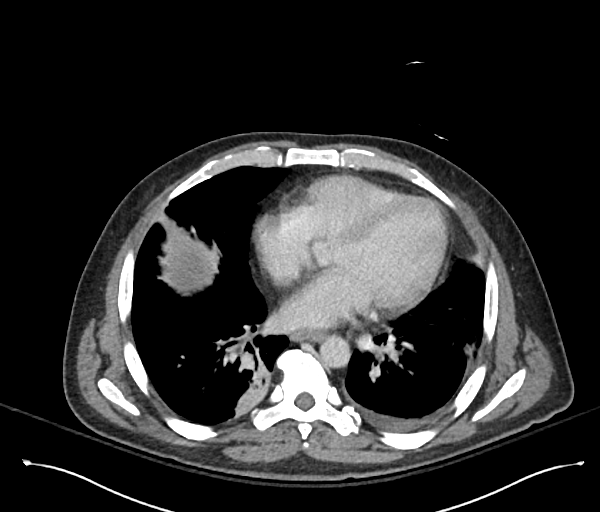

[13 of 46 positions shown; findings below may reference images not displayed]

FINDINGS: Lower chest: Small left pleural effusion. Bilateral lower lobe
airspace opacities, left greater than right.

Hepatobiliary: No focal hepatic abnormality. Gallbladder
unremarkable.

Pancreas: Pancreas is poorly visualized due to extensive fluid and
gas dissecting throughout the retroperitoneum. Fluid in the region
of the pancreatic body and tail.

Spleen: No focal abnormality.  Normal size.

Adrenals/Urinary Tract: No adrenal abnormality. No focal renal
abnormality. No stones or hydronephrosis. Urinary bladder is
unremarkable.

Stomach/Bowel: There is extensive gas and fluid dissecting
throughout the retroperitoneum. This continues into the peritoneum
with pneumoperitoneum and ascites. This most likely reflects
perforated hollow viscus. Given the degree of involvement of the
retroperitoneum, I would suspect a retroperitoneal perforation,
possibly perforated duodenal ulcer. Other possible source would be
the descending duodenum although no real concerning appearance of
the descending duodenum. Less likely but possible would be
perforated peritoneal bowel.

Vascular/Lymphatic: No evidence of aneurysm or adenopathy.

Reproductive: No visible focal abnormality.

Other: Extensive retroperitoneal fluid and gas as well as
pneumoperitoneum and ascites as described above.

Musculoskeletal: No acute bony abnormality.
IMPRESSION: Extensive gas and fluid dissecting throughout the retroperitoneum
and likely extending into the peritoneum where there is
pneumoperitoneum and ascites. Appearance is most compatible with
perforated bowel, likely in the retroperitoneum with duodenal the
most likely source although exact source is not readily apparent.

Pancreas poorly visualized with fluid and gas in the region of the
body and tail. While necrotizing pancreatitis could have this
appearance, this is felt less likely given the extent of
retroperitoneal gas and fluid and a normal lipase.

Critical Value/emergent results were called by telephone at the time
of interpretation on 10/21/2020 at [DATE] to provider SEVASTIJAN RANKOVIC
, who verbally acknowledged these results.

## 2022-10-05 IMAGING — DX DG ABD PORTABLE 1V
2 series · 2 of 2 positions shown · non-contrast
Comparison: Abdominal x-ray 10/25/2020.

CLINICAL DATA: NG tube placement.

EXAM:
PORTABLE ABDOMEN - 1 VIEW

[abdomen supine (1 of 2)]
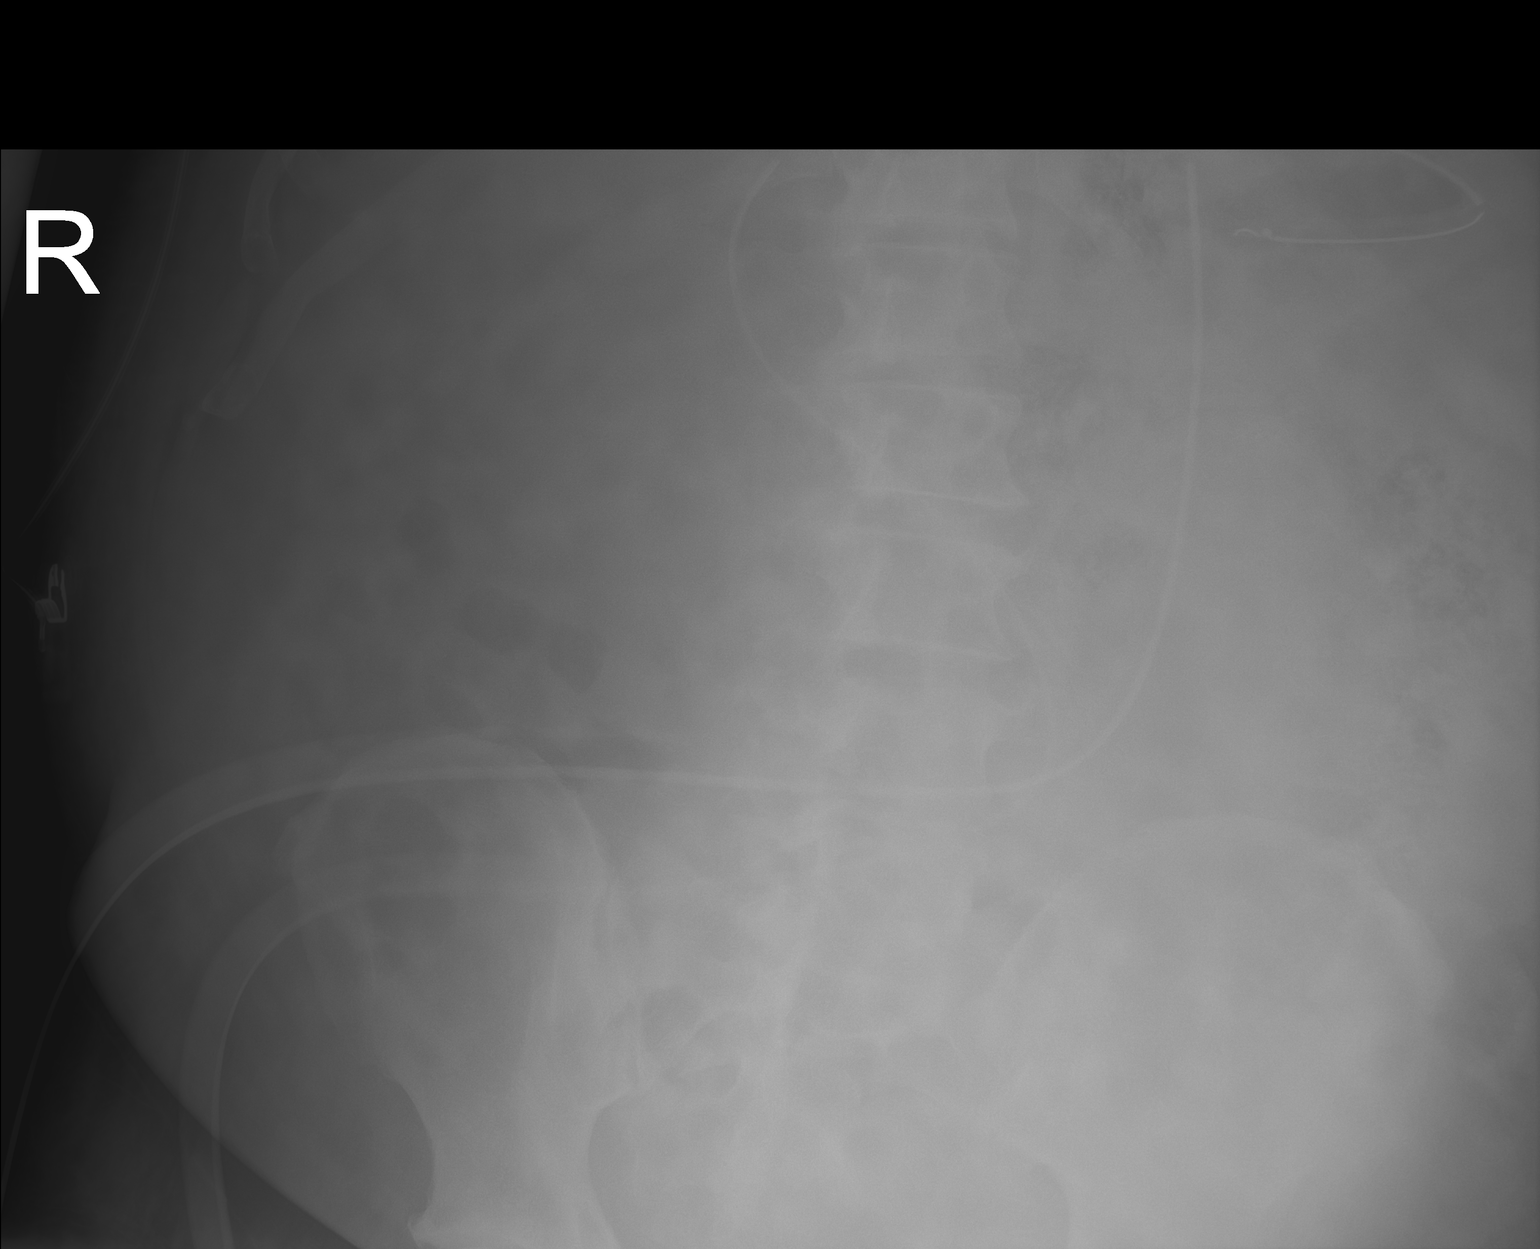

[abdomen supine (2 of 2)]
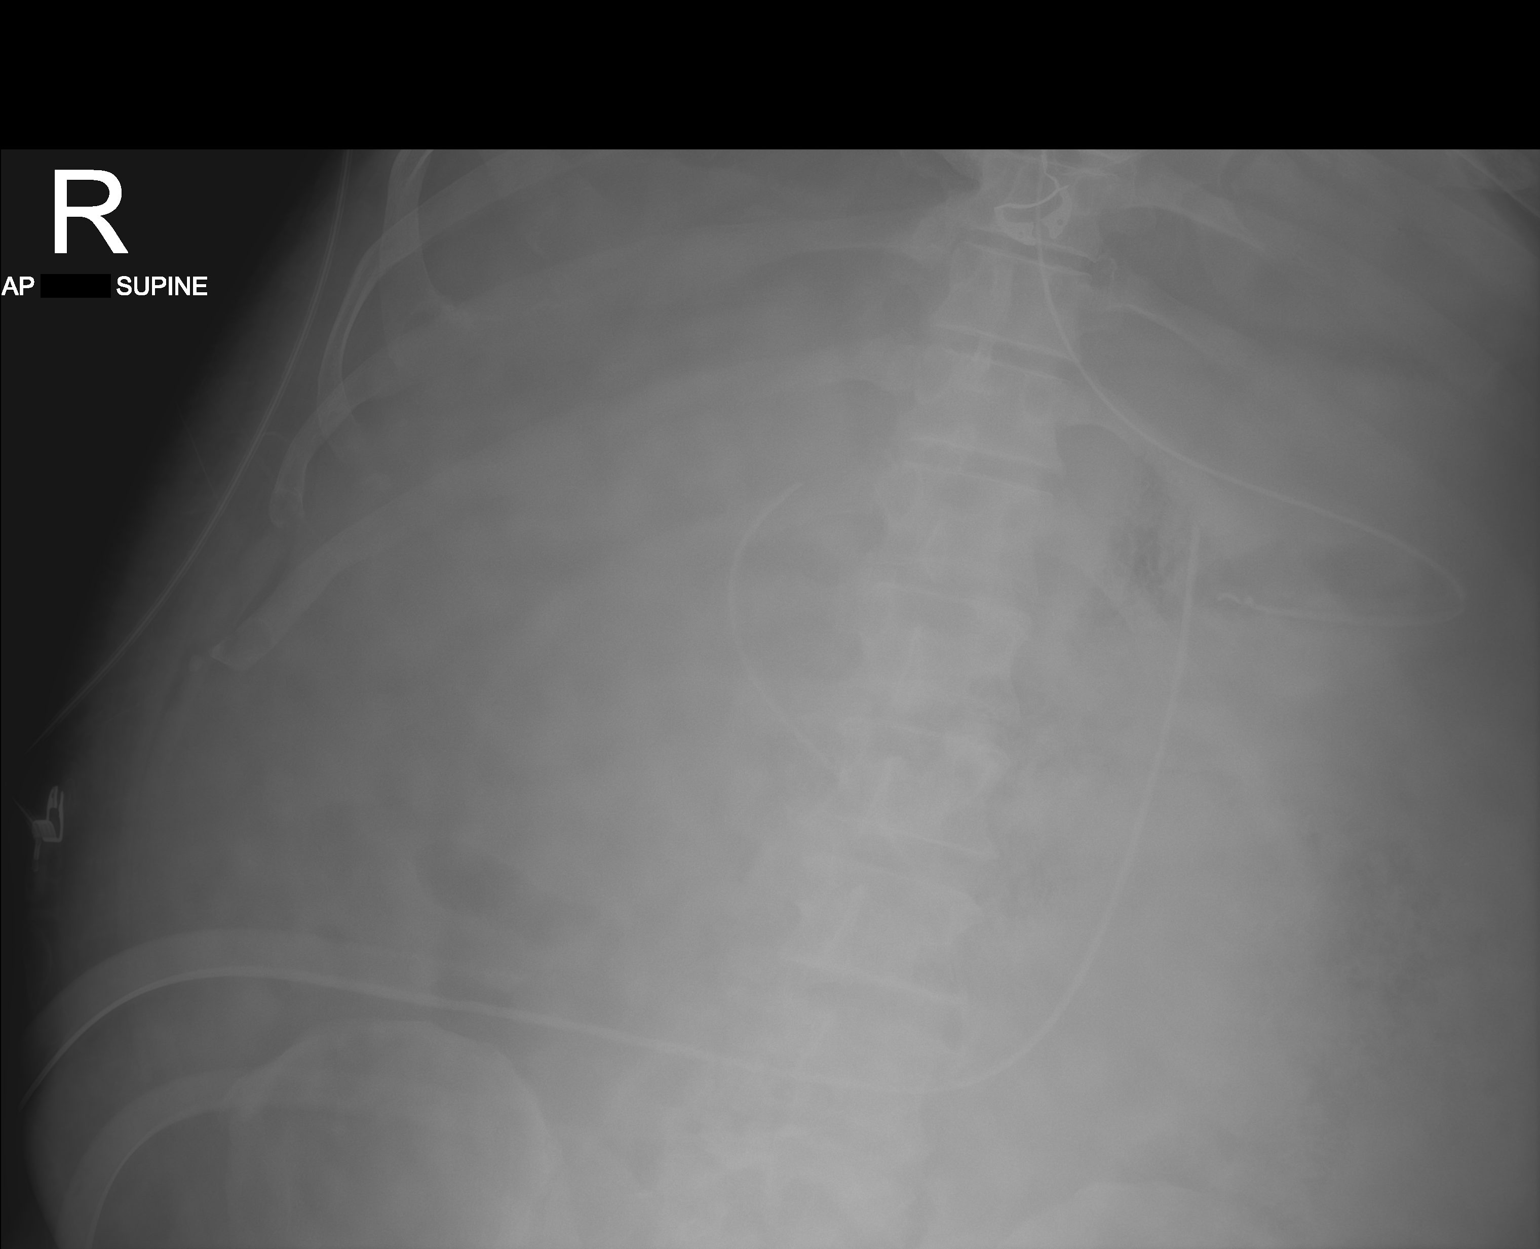

[2 of 2 positions shown; findings below may reference images not displayed]

FINDINGS: Enteric tube tip terminates in the mid stomach. Lines overlie the
mid abdomen, unchanged. No dilated bowel loops are visualized.
IMPRESSION: 1. Enteric tube tip terminates in the mid stomach.

## 2022-10-07 IMAGING — DX DG CHEST 1V PORT
1 series · 1 of 1 positions shown · non-contrast
Comparison: October 22, 2020.

CLINICAL DATA: Dyspnea.

EXAM:
PORTABLE CHEST 1 VIEW

[chest ap]
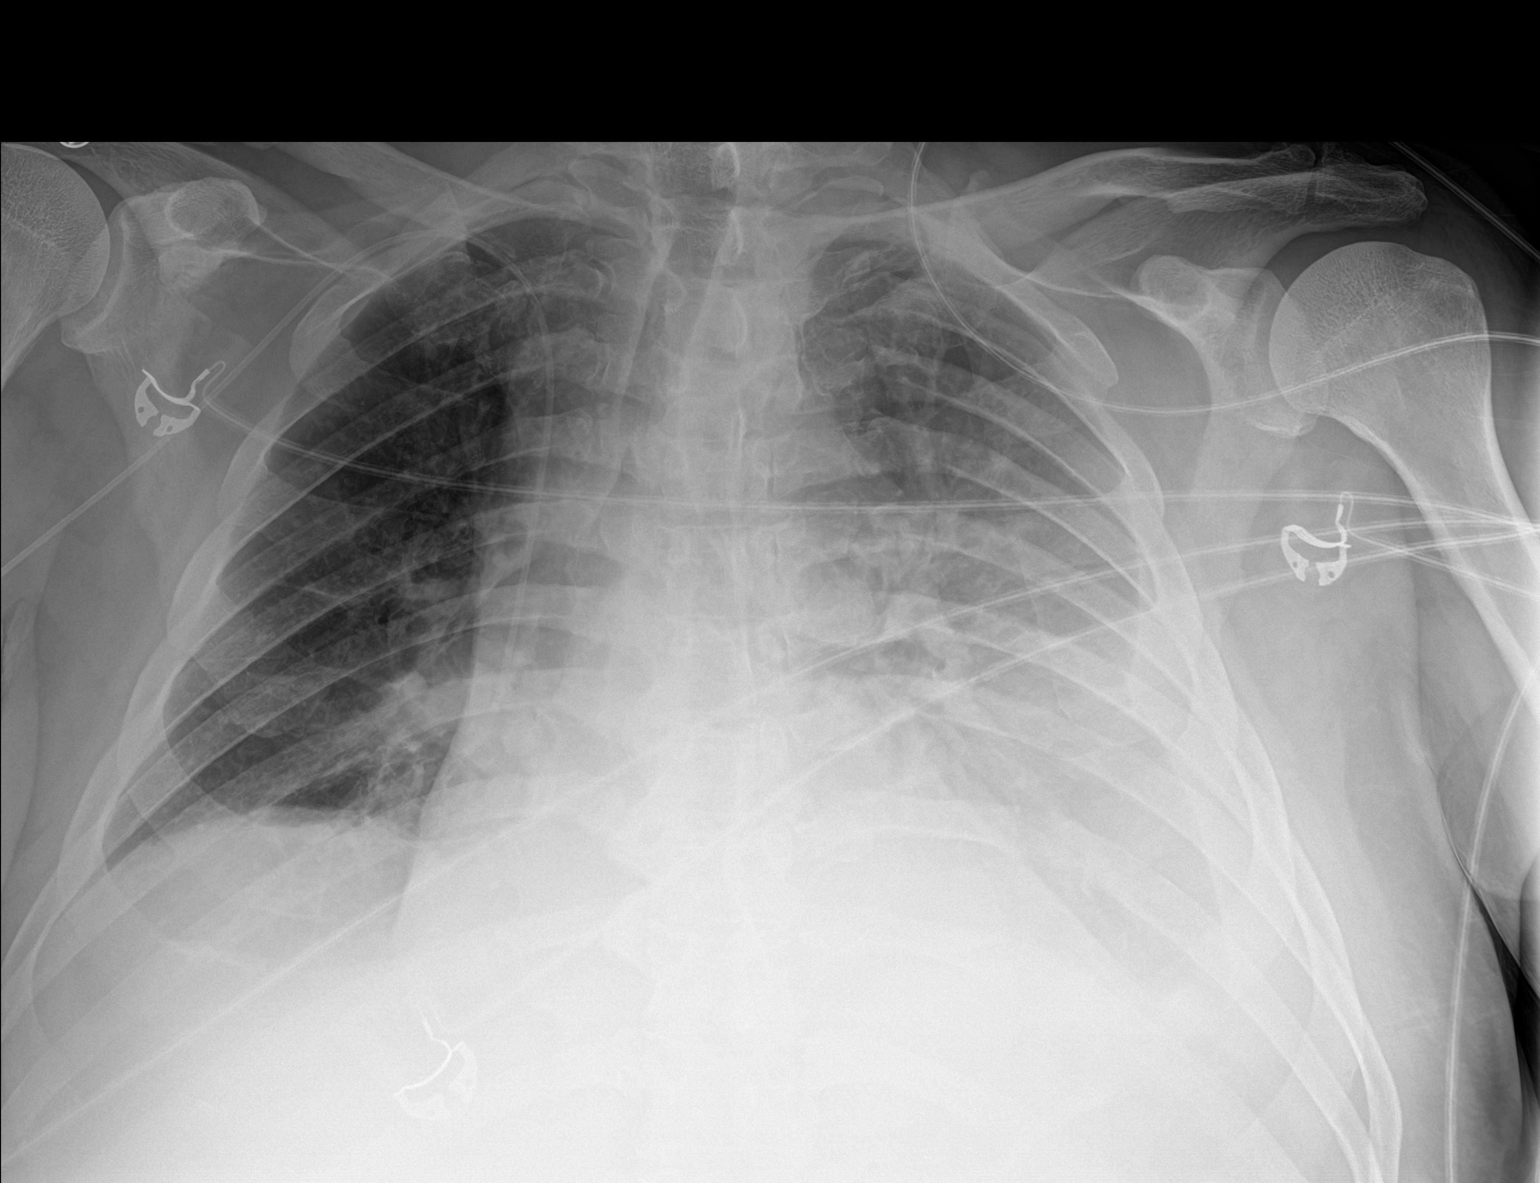

[1 of 1 positions shown; findings below may reference images not displayed]

FINDINGS: Stable cardiomegaly. Interval placement of right-sided PICC line
with distal tip in expected position of cavoatrial junction. Right
lung is clear. Endotracheal and nasogastric tubes have been removed.
Left midlung and basilar opacity is noted concerning for pneumonia
or atelectasis with associated pleural effusion. Bony thorax is
unremarkable.
IMPRESSION: Increased left lung opacity is noted concerning for pneumonia or
atelectasis with associated left pleural effusion.
